# Patient Record
Sex: Male | Born: 1935 | Race: White | Hispanic: No | Marital: Married | Smoking: Former smoker
Health system: Southern US, Community
[De-identification: ages and names within clinical notes are randomized; demographics above are authoritative.]

## PROBLEM LIST (undated history)

## (undated) DIAGNOSIS — C61 Malignant neoplasm of prostate: Secondary | ICD-10-CM

## (undated) DIAGNOSIS — K219 Gastro-esophageal reflux disease without esophagitis: Secondary | ICD-10-CM

## (undated) DIAGNOSIS — I10 Essential (primary) hypertension: Secondary | ICD-10-CM

## (undated) DIAGNOSIS — M2140 Flat foot [pes planus] (acquired), unspecified foot: Secondary | ICD-10-CM

## (undated) DIAGNOSIS — I340 Nonrheumatic mitral (valve) insufficiency: Secondary | ICD-10-CM

## (undated) DIAGNOSIS — I442 Atrioventricular block, complete: Secondary | ICD-10-CM

## (undated) DIAGNOSIS — Z8719 Personal history of other diseases of the digestive system: Secondary | ICD-10-CM

## (undated) DIAGNOSIS — I2510 Atherosclerotic heart disease of native coronary artery without angina pectoris: Secondary | ICD-10-CM

## (undated) DIAGNOSIS — M1990 Unspecified osteoarthritis, unspecified site: Secondary | ICD-10-CM

## (undated) DIAGNOSIS — N529 Male erectile dysfunction, unspecified: Secondary | ICD-10-CM

## (undated) DIAGNOSIS — M7740 Metatarsalgia, unspecified foot: Secondary | ICD-10-CM

## (undated) DIAGNOSIS — E7800 Pure hypercholesterolemia, unspecified: Secondary | ICD-10-CM

## (undated) DIAGNOSIS — C329 Malignant neoplasm of larynx, unspecified: Secondary | ICD-10-CM

## (undated) DIAGNOSIS — C44310 Basal cell carcinoma of skin of unspecified parts of face: Secondary | ICD-10-CM

## (undated) DIAGNOSIS — I219 Acute myocardial infarction, unspecified: Secondary | ICD-10-CM

## (undated) DIAGNOSIS — G2581 Restless legs syndrome: Secondary | ICD-10-CM

## (undated) DIAGNOSIS — Z950 Presence of cardiac pacemaker: Secondary | ICD-10-CM

## (undated) DIAGNOSIS — Z8679 Personal history of other diseases of the circulatory system: Secondary | ICD-10-CM

## (undated) HISTORY — DX: Pure hypercholesterolemia, unspecified: E78.00

## (undated) HISTORY — DX: Atherosclerotic heart disease of native coronary artery without angina pectoris: I25.10

## (undated) HISTORY — PX: COLONOSCOPY W/ POLYPECTOMY: SHX1380

## (undated) HISTORY — DX: Metatarsalgia, unspecified foot: M77.40

## (undated) HISTORY — DX: Flat foot (pes planus) (acquired), unspecified foot: M21.40

## (undated) HISTORY — PX: GANGLION CYST EXCISION: SHX1691

## (undated) HISTORY — DX: Personal history of other diseases of the circulatory system: Z86.79

## (undated) HISTORY — DX: Restless legs syndrome: G25.81

## (undated) HISTORY — PX: BASAL CELL CARCINOMA EXCISION: SHX1214

## (undated) HISTORY — DX: Gastro-esophageal reflux disease without esophagitis: K21.9

## (undated) HISTORY — DX: Essential (primary) hypertension: I10

## (undated) HISTORY — PX: CORONARY ANGIOPLASTY: SHX604

## (undated) HISTORY — DX: Malignant neoplasm of prostate: C61

## (undated) HISTORY — DX: Male erectile dysfunction, unspecified: N52.9

## (undated) HISTORY — DX: Personal history of other diseases of the digestive system: Z87.19

## (undated) HISTORY — PX: CORONARY ANGIOPLASTY WITH STENT PLACEMENT: SHX49

## (undated) HISTORY — DX: Nonrheumatic mitral (valve) insufficiency: I34.0

## (undated) HISTORY — PX: OTHER SURGICAL HISTORY: SHX169

## (undated) HISTORY — PX: PROSTATE BIOPSY: SHX241

---
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
## 1969-04-26 DIAGNOSIS — C329 Malignant neoplasm of larynx, unspecified: Secondary | ICD-10-CM

## 1969-04-26 HISTORY — DX: Malignant neoplasm of larynx, unspecified: C32.9

## 1999-06-15 ENCOUNTER — Ambulatory Visit (HOSPITAL_COMMUNITY): Admission: RE | Admit: 1999-06-15 | Discharge: 1999-06-15 | Payer: Self-pay | Admitting: Gastroenterology

## 1999-07-24 ENCOUNTER — Emergency Department (HOSPITAL_COMMUNITY): Admission: EM | Admit: 1999-07-24 | Discharge: 1999-07-24 | Payer: Self-pay | Admitting: Emergency Medicine

## 1999-08-27 DIAGNOSIS — I219 Acute myocardial infarction, unspecified: Secondary | ICD-10-CM

## 1999-08-27 HISTORY — DX: Acute myocardial infarction, unspecified: I21.9

## 1999-08-29 ENCOUNTER — Ambulatory Visit (HOSPITAL_COMMUNITY): Admission: RE | Admit: 1999-08-29 | Discharge: 1999-08-29 | Payer: Self-pay | Admitting: Internal Medicine

## 2000-05-22 ENCOUNTER — Encounter: Payer: Self-pay | Admitting: Emergency Medicine

## 2000-05-22 ENCOUNTER — Inpatient Hospital Stay (HOSPITAL_COMMUNITY): Admission: EM | Admit: 2000-05-22 | Discharge: 2000-05-25 | Payer: Self-pay | Admitting: Emergency Medicine

## 2000-06-24 ENCOUNTER — Encounter (HOSPITAL_COMMUNITY): Admission: RE | Admit: 2000-06-24 | Discharge: 2000-09-22 | Payer: Self-pay | Admitting: Cardiology

## 2000-08-11 ENCOUNTER — Ambulatory Visit (HOSPITAL_BASED_OUTPATIENT_CLINIC_OR_DEPARTMENT_OTHER): Admission: RE | Admit: 2000-08-11 | Discharge: 2000-08-11 | Payer: Self-pay | Admitting: Gastroenterology

## 2000-09-23 ENCOUNTER — Encounter (HOSPITAL_COMMUNITY): Admission: RE | Admit: 2000-09-23 | Discharge: 2000-09-28 | Payer: Self-pay | Admitting: Cardiology

## 2000-09-29 ENCOUNTER — Encounter (HOSPITAL_COMMUNITY): Admission: RE | Admit: 2000-09-29 | Discharge: 2000-12-28 | Payer: Self-pay | Admitting: Cardiology

## 2000-11-09 ENCOUNTER — Emergency Department (HOSPITAL_COMMUNITY): Admission: EM | Admit: 2000-11-09 | Discharge: 2000-11-09 | Payer: Self-pay

## 2000-11-09 ENCOUNTER — Encounter: Payer: Self-pay | Admitting: Emergency Medicine

## 2000-12-11 ENCOUNTER — Ambulatory Visit (HOSPITAL_COMMUNITY): Admission: RE | Admit: 2000-12-11 | Discharge: 2000-12-12 | Payer: Self-pay | Admitting: Cardiology

## 2000-12-29 ENCOUNTER — Encounter (HOSPITAL_COMMUNITY): Admission: RE | Admit: 2000-12-29 | Discharge: 2001-03-03 | Payer: Self-pay | Admitting: Cardiology

## 2001-07-09 ENCOUNTER — Ambulatory Visit (HOSPITAL_COMMUNITY): Admission: RE | Admit: 2001-07-09 | Discharge: 2001-07-10 | Payer: Self-pay | Admitting: Cardiology

## 2002-01-28 ENCOUNTER — Ambulatory Visit (HOSPITAL_COMMUNITY): Admission: RE | Admit: 2002-01-28 | Discharge: 2002-01-29 | Payer: Self-pay | Admitting: Cardiology

## 2003-07-06 ENCOUNTER — Encounter: Admission: RE | Admit: 2003-07-06 | Discharge: 2003-07-06 | Payer: Self-pay | Admitting: Orthopedic Surgery

## 2003-07-25 ENCOUNTER — Encounter: Admission: RE | Admit: 2003-07-25 | Discharge: 2003-07-25 | Payer: Self-pay | Admitting: Orthopedic Surgery

## 2004-02-01 ENCOUNTER — Encounter: Admission: RE | Admit: 2004-02-01 | Discharge: 2004-02-01 | Payer: Self-pay | Admitting: Orthopedic Surgery

## 2004-03-07 ENCOUNTER — Encounter: Admission: RE | Admit: 2004-03-07 | Discharge: 2004-03-07 | Payer: Self-pay | Admitting: Orthopedic Surgery

## 2005-08-26 HISTORY — PX: FOOT ARTHRODESIS, TRIPLE: SUR54

## 2005-12-04 ENCOUNTER — Inpatient Hospital Stay (HOSPITAL_COMMUNITY): Admission: RE | Admit: 2005-12-04 | Discharge: 2005-12-06 | Payer: Self-pay | Admitting: Orthopedic Surgery

## 2006-04-01 ENCOUNTER — Ambulatory Visit: Admission: RE | Admit: 2006-04-01 | Discharge: 2006-05-18 | Payer: Self-pay | Admitting: Radiation Oncology

## 2006-04-16 ENCOUNTER — Ambulatory Visit (HOSPITAL_COMMUNITY): Admission: RE | Admit: 2006-04-16 | Discharge: 2006-04-16 | Payer: Self-pay | Admitting: Urology

## 2006-04-25 ENCOUNTER — Ambulatory Visit: Payer: Self-pay | Admitting: Internal Medicine

## 2006-06-06 ENCOUNTER — Ambulatory Visit: Payer: Self-pay | Admitting: Internal Medicine

## 2006-07-04 ENCOUNTER — Ambulatory Visit: Payer: Self-pay | Admitting: Internal Medicine

## 2006-08-26 DIAGNOSIS — C61 Malignant neoplasm of prostate: Secondary | ICD-10-CM | POA: Insufficient documentation

## 2006-08-26 HISTORY — DX: Malignant neoplasm of prostate: C61

## 2006-09-08 ENCOUNTER — Ambulatory Visit: Admission: RE | Admit: 2006-09-08 | Discharge: 2006-11-28 | Payer: Self-pay | Admitting: Radiation Oncology

## 2006-11-10 ENCOUNTER — Ambulatory Visit: Payer: Self-pay | Admitting: Internal Medicine

## 2006-11-27 ENCOUNTER — Ambulatory Visit (HOSPITAL_BASED_OUTPATIENT_CLINIC_OR_DEPARTMENT_OTHER): Admission: RE | Admit: 2006-11-27 | Discharge: 2006-11-27 | Payer: Self-pay | Admitting: Urology

## 2006-11-28 ENCOUNTER — Ambulatory Visit: Admission: RE | Admit: 2006-11-28 | Discharge: 2006-12-29 | Payer: Self-pay | Admitting: Radiation Oncology

## 2006-12-19 ENCOUNTER — Ambulatory Visit: Payer: Self-pay | Admitting: Internal Medicine

## 2007-02-03 ENCOUNTER — Ambulatory Visit: Payer: Self-pay | Admitting: Psychology

## 2007-02-10 ENCOUNTER — Ambulatory Visit: Payer: Self-pay | Admitting: Psychology

## 2007-02-24 ENCOUNTER — Ambulatory Visit: Payer: Self-pay | Admitting: Psychology

## 2007-02-24 ENCOUNTER — Ambulatory Visit: Payer: Self-pay | Admitting: Sports Medicine

## 2007-03-10 ENCOUNTER — Ambulatory Visit: Payer: Self-pay | Admitting: Psychology

## 2007-03-24 ENCOUNTER — Ambulatory Visit: Payer: Self-pay | Admitting: Psychology

## 2007-03-27 ENCOUNTER — Ambulatory Visit: Payer: Self-pay | Admitting: Sports Medicine

## 2007-04-06 ENCOUNTER — Ambulatory Visit: Payer: Self-pay | Admitting: Psychology

## 2007-04-17 ENCOUNTER — Ambulatory Visit: Payer: Self-pay | Admitting: Psychology

## 2007-04-21 ENCOUNTER — Ambulatory Visit (HOSPITAL_COMMUNITY): Admission: RE | Admit: 2007-04-21 | Discharge: 2007-04-21 | Payer: Self-pay | Admitting: Cardiology

## 2007-05-18 ENCOUNTER — Ambulatory Visit: Payer: Self-pay | Admitting: Internal Medicine

## 2007-05-21 ENCOUNTER — Ambulatory Visit: Payer: Self-pay | Admitting: Psychology

## 2007-06-02 ENCOUNTER — Ambulatory Visit: Payer: Self-pay | Admitting: Psychology

## 2007-06-16 ENCOUNTER — Ambulatory Visit: Payer: Self-pay | Admitting: Psychology

## 2007-06-30 ENCOUNTER — Ambulatory Visit: Payer: Self-pay | Admitting: Psychology

## 2007-07-14 ENCOUNTER — Ambulatory Visit: Payer: Self-pay | Admitting: Psychology

## 2007-07-29 ENCOUNTER — Ambulatory Visit: Payer: Self-pay | Admitting: Psychology

## 2007-08-11 ENCOUNTER — Ambulatory Visit: Payer: Self-pay | Admitting: Psychology

## 2007-09-09 ENCOUNTER — Ambulatory Visit: Payer: Self-pay | Admitting: Psychology

## 2007-09-15 ENCOUNTER — Encounter: Admission: RE | Admit: 2007-09-15 | Discharge: 2007-09-15 | Payer: Self-pay | Admitting: Internal Medicine

## 2007-09-24 ENCOUNTER — Ambulatory Visit: Payer: Self-pay | Admitting: Psychology

## 2007-10-07 ENCOUNTER — Ambulatory Visit: Payer: Self-pay | Admitting: Psychology

## 2007-10-28 ENCOUNTER — Ambulatory Visit: Payer: Self-pay | Admitting: Psychology

## 2007-11-13 DIAGNOSIS — K219 Gastro-esophageal reflux disease without esophagitis: Secondary | ICD-10-CM | POA: Insufficient documentation

## 2007-11-13 DIAGNOSIS — J309 Allergic rhinitis, unspecified: Secondary | ICD-10-CM | POA: Insufficient documentation

## 2007-11-16 ENCOUNTER — Ambulatory Visit: Payer: Self-pay | Admitting: Internal Medicine

## 2007-11-17 ENCOUNTER — Ambulatory Visit: Payer: Self-pay | Admitting: Psychology

## 2007-12-01 ENCOUNTER — Ambulatory Visit: Payer: Self-pay | Admitting: Psychology

## 2007-12-15 ENCOUNTER — Ambulatory Visit: Payer: Self-pay | Admitting: Psychology

## 2007-12-29 ENCOUNTER — Ambulatory Visit: Payer: Self-pay | Admitting: Psychology

## 2008-01-12 ENCOUNTER — Ambulatory Visit: Payer: Self-pay | Admitting: Psychology

## 2008-02-19 ENCOUNTER — Ambulatory Visit: Payer: Self-pay | Admitting: Sports Medicine

## 2008-04-05 ENCOUNTER — Ambulatory Visit: Payer: Self-pay | Admitting: Sports Medicine

## 2008-06-10 ENCOUNTER — Ambulatory Visit: Payer: Self-pay | Admitting: Internal Medicine

## 2008-07-22 ENCOUNTER — Ambulatory Visit: Payer: Self-pay | Admitting: Internal Medicine

## 2008-11-28 ENCOUNTER — Ambulatory Visit: Payer: Self-pay | Admitting: Internal Medicine

## 2008-12-12 ENCOUNTER — Ambulatory Visit: Payer: Self-pay | Admitting: Internal Medicine

## 2008-12-16 ENCOUNTER — Telehealth (INDEPENDENT_AMBULATORY_CARE_PROVIDER_SITE_OTHER): Payer: Self-pay | Admitting: *Deleted

## 2009-05-30 ENCOUNTER — Ambulatory Visit: Payer: Self-pay | Admitting: Internal Medicine

## 2009-11-27 ENCOUNTER — Ambulatory Visit: Payer: Self-pay | Admitting: Internal Medicine

## 2009-12-21 ENCOUNTER — Ambulatory Visit: Payer: Self-pay | Admitting: Sports Medicine

## 2010-03-26 ENCOUNTER — Ambulatory Visit: Payer: Self-pay | Admitting: Psychology

## 2010-04-06 ENCOUNTER — Ambulatory Visit: Payer: Self-pay | Admitting: Psychology

## 2010-04-20 ENCOUNTER — Ambulatory Visit: Payer: Self-pay | Admitting: Psychology

## 2010-05-22 ENCOUNTER — Ambulatory Visit: Payer: Self-pay | Admitting: Psychology

## 2010-07-27 ENCOUNTER — Telehealth (INDEPENDENT_AMBULATORY_CARE_PROVIDER_SITE_OTHER): Payer: Self-pay | Admitting: *Deleted

## 2010-07-30 ENCOUNTER — Ambulatory Visit: Payer: Self-pay | Admitting: Internal Medicine

## 2010-09-15 ENCOUNTER — Encounter: Payer: Self-pay | Admitting: Orthopedic Surgery

## 2010-09-27 NOTE — Assessment & Plan Note (Signed)
Summary: FOLLOW UP/ MBW   Primary Provider/Referring Provider:  Earl Gala   History of Present Illness: Current Problems:  FOOT PAIN, CHRONIC (ICD-729.5) HYPERTENSION (ICD-401.9) CARCINOMA, PROSTATE (ICD-185) ESOPHAGEAL REFLUX (ICD-530.81) COUGH (ICD-786.2) ALLERGIC RHINITIS (ICD-477.9) PES PLANUS (ICD-734) METATARSALGIA (ICD-726.70)  07/22/08- Saw TP 06/10/08-  77 year old male with known history of HTN, Allergic rhinitis.  June 10, 2008--Complains 3 weeks of cough minimally productive. [place], some better but has not totally resolved. Has reflux/heartburn  on regular basis. Denies chest pain, dyspnea, orthopnea, hemoptysis, fever, n/v/d, edema. Complains of nasal drip, cough worse in AM and at bedtime , feels tickle in throat, and frequent throat clearing. OTC not helping.   07/22/08- Cough, allergic rhinitis Was better initially after last visit then relapsed. Felt too dry during trip to [place]. Had persistent cough. No fever or acute change. Throat feels scratchy and hoarse. Denies sinus congestion, PND, fever. Chest feels ok, ears ok. Had flu shot. Dr. Earl Gala gave dextromethasone for colitis.   11/28/08- Cough, allergic rhinitis Had a persistent head cold in December and again in February. Bike rider. Notes less exercise tolerance going 20 miles on bike (age 32). Persistent throat clearing type cough, usually only with scant morning phlegm. currently pollen allergy is botherinig and he is taking chlorpheneramine with  Benadryl- makes hjim feel tired. aware of mitral valve leakeage. denies awareness of reflux.     Current Medications (verified): 1)  Atenolol 25 Mg  Tabs (Atenolol) .... Take 1/2  Tablet By Mouth Once A Day 2)  Exforge 10-320 Mg Tabs (Amlodipine Besylate-Valsartan) .... Take 1 By Mouth Once Daily 3)  Crestor 20 Mg  Tabs (Rosuvastatin Calcium) .... Take 1 Tablet By Mouth Once A Day 4)  Albuterol 90 Mcg/act  Aers (Albuterol) .... As  Needed Up To 4x Daily 5)  Multivitamins   Tabs (Multiple Vitamin) .... Take 1 Tablet By Mouth Once A Day 6)  Fish Oil   Oil (Fish Oil) .... Take 1 Capsule By Mouth Once A Day 7)  Coenzyme Q10 100 Mg  Caps (Coenzyme Q10) .... Take 1 Tablet By Mouth Two Times A Day 8)  Bayer Aspirin 325 Mg Tabs (Aspirin) .... Take 1 By Mouth Once Daily 9)  Chlorpheniramine Maleate 4 Mg Tabs (Chlorpheniramine Maleate) .... Take 3-4 Daily  Allergies (verified): No Known Drug Allergies  Past History:  Past Surgical History:    Foot arthrodesis    External beam xrt and seeds for prostate cancer  Family History:    Reviewed history and no changes required:       Father - died 44 yo cardiac disease       Mother- died old age  Social History:    Reviewed history from 11/16/2007 and no changes required:       Drinks 3-4 glasses of wine a day       Patient states former smoker.   Review of Systems      See HPI  The patient denies anorexia, fever, chest pain, syncope, and headaches.     Vital Signs:  Patient profile:   77 year old male Height:      69.5 inches Weight:      198.13 pounds BMI:     28.94 Pulse rate:   59 / minute BP sitting:   124 / 60  (left arm) Cuff size:   regular  Vitals Entered By: Reynaldo Minium CMA (November 28, 2008 2:14 PM)  O2 Sat on room air  at rest %:  98 Comments Medications reviewed with patient Reynaldo Minium CMA  November 28, 2008 2:16 PM    Physical Exam  Additional Exam:  General: A/Ox3; pleasant and cooperative, NAD, SKIN: no rash, lesions NODES: no lymphadenopathy HEENT: [place]/AT, EOM- WNL, Conjuctivae- clear, PERRLA, TM-WNL, Nose- clear, Throat- clear and wnl, throat clearing but appears  benign, Melampatti IV obscures pharynx, not red. Throat clearing. NECK: Supple w/ fair ROM, JVD- none, normal carotid impulses w/o bruits Thyroid- normal to palpation CHEST: Clear to P&A HEART: RRR, no m/g/r heard ABDOMEN:  ZOX:WRUE, nl pulses, no edema  NEURO: Grossly intact  to observation      Impression & Recommendations:  Problem # 1:  ALLERGIC RHINITIS (ICD-477.9)  Suggest he try a nonsedating antihistamine to avoid med induced "tiredness'. His updated medication list for this problem includes:    Chlorpheniramine Maleate 4 Mg Tabs (Chlorpheniramine maleate) .Marland Kitchen... Take 3-4 daily  Problem # 2:  COUGH (ICD-786.2)  Cyclical multifactorial. We discussed reassessing PFT due to his questions about copd from remote smoking.  Orders: Est. Patient Level III (45409) Pulmonary Referral (Pulmonary)  Medications Added to Medication List This Visit: 1)  Atenolol 25 Mg Tabs (Atenolol) .... Take 1/2  tablet by mouth once a day 2)  Exforge 10-320 Mg Tabs (Amlodipine besylate-valsartan) .... Take 1 by mouth once daily  Patient Instructions: 1)  Please schedule a follow-up appointment in 6 months. 2)  Schedule PFT 3)  Try using a nonsedating antihistamine like otc claritin- you can take this twice daily for awhile if needed.

## 2010-09-27 NOTE — Assessment & Plan Note (Signed)
Summary: new pt/1 yr post surgery on foot/having problems/el   Vital Signs:  Patient Profile:   77 Years Old Male Weight:      191 pounds Pulse rate:   54 / minute BP sitting:   121 / 55  Vitals Entered By: Lillia Pauls CMA (February 24, 2007 3:09 PM)               Chief Complaint:  NEW PT/R FOOT PAIN S/P FOOT SURGERY X 15 MOS AGO.  History of Present Illness: 77  year old male who had triple arthrodecisis of the right foot who c/o pain on walking or any weight placed on his right side of body.  Right foot fusion was because patient has been flat footed majority of his life and has been a runner for many years with wearing and tearing of right foot.  Recently recieved a corticosteroid injection that did not give any relief.  Dr Lajoyce Corners did his surgery. Was advised in [place] to get the same. Ran years ago for [facility] sense;  now primary activities are walking and tennis but unable to do these secondary to pain.    Past Medical History:    Heart Attack-2001   Social History:    Drinks 3-4 glasses of wine a day    non-smoker    Review of Systems       foot pain on the bottom of the foot when trying to ambulate or bear weight on the right side of his body    Foot/Ankle Exam  General:    Well-developed, well-nourished, in no acute distress; alert and oriented x 3.    Gait:    Walking with a slight limp on the right side  Skin:    Intact with no erythema; no scarring.    Inspection:    deformity: where the triple fusion is located on the right foot  He has total collapse of longitudinal arch lateral toes curled inward on the right foot note there is minimal motions of the right ankle  Left foot reveals pes planus hammertoe of second toe breakdown of the longitudinal and transverse arch  Palpation:    Tenderness under the metatarsal area of the right foot and near the talus bone  Vascular:    dorsalis pedis and posterior tibial pulses 2+ and symmetric, capillary refill  < 2 seconds, normal hair pattern, no evidence of ischemia.     Impression & Recommendations:  Problem # 1:  R Foot Pain Pain 2nd to triple arthodecisis as a result of new pressure points   Problem # 2:  METATARSALGIA (ICD-726.70) added MT pads to both soft orthotic inserts his Orders: Metatarsal pads- [facility] (W1191)   Problem # 3:  PES PLANUS (ICD-734) will need permanent orthotic support does   Patient Instructions: 1)  Temporary orthotics were made for the patient to get temporary relief.  If they continue to relieve pressure from his right foot and left foot, more permanent orthotics will be made if these continue to give him relief for a month. 2)  Follow-up in a month

## 2010-09-27 NOTE — Assessment & Plan Note (Signed)
Summary: F/U VISIT [facility]   Vital Signs:  Patient Profile:   77 Years Old Male Weight:      193 pounds Pulse rate:   56 / minute BP sitting:   119 / 61  Vitals Entered By: Enid Baas MD (March 27, 2007 10:14 AM)               Chief Complaint:  POSSIBLE ORTHOTICS S/P TRIPLE ARTHRODESIS.  History of Present Illness: Pt. is a 77 yo wm with f/u for bilateral foot pain right greater than left, s/p right triple arthrodesis 2007 by Dr. Lajoyce Corners. Patient has two pairs of orthotics, previously built up with large metatarsal pads at last SM visit. Pt. felt some pain after wearing for approx. 4 hours in arch, so altered own orthotics with small metatarsal pads and right hammer toe pad. feeling somewhat better but remains symptomatic.   minimally physically active.   some pain with toe raises and when stretching of achilles tendon on right.    Past Medical History:    Heart Attack-2001    Prostate cancer - 2008     Review of Systems  General      recent prostate cancer  MS      Complains of joint pain and stiffness.      Denies joint swelling.      as hpi   Physical Exam  General:     alert, well-developed, well-nourished, and well-hydrated.   Head:     normocephalic and atraumatic.   Eyes:     vision grossly intact.   Nose:     no external deformity.   Mouth:     good dentition.   Lungs:     normal respiratory effort.   Msk:     no joint tenderness, no joint swelling, no joint warmth, and no redness over joints.  calf atrophy bilaterally. pes planus bilaterally r greater than left. 1st and 3rd and 4th metatarsal heads prominent with elevated 2nd metatarsal heads. 5th palpalble and inward turning.  Has chronic foot deformity secondary to collapse of transverse and longitudinal arches.  Has breakdwon incorporating both the rear amd mid foot areas.  ON RT there is scarring from arthrodesis and minimal ankle motion. Pulses:     L dorsalis pedis normal.  r dp  1+ Extremities:     No clubbing, cyanosis, edema, or deformity noted with normal full range of motion of all joints.   Neurologic:     alert & oriented X3 and sensation intact to light touch.      Impression & Recommendations:  Problem # 1:  METATARSALGIA (ICD-726.70) Assessment: Improved continue with either set of metatarsal pads, posting applied to right lateral orthotics times 2. work on internal toe flexion and calf exercises with sitting without pain.  given hapad small metatarsal pads. Orders: [facility]- Est Level  3 (54098)   Problem # 2:  PES PLANUS (ICD-734) Assessment: Unchanged as above  Marked foot breadown bilaterally and we are left even after arthrodesis with strategies to try to lessen pain and improve function. Orders: [facility]- Est Level  3 ([id number])

## 2010-09-27 NOTE — Assessment & Plan Note (Signed)
Summary: foot pain/eo   Vital Signs:  Patient Profile:   77 Years Old Male Pulse rate:   65 / minute BP sitting:   119 / 68  Vitals Entered By: Andrew Marshall CMA (February 19, 2008 10:43 AM)                 PCP:  Earl Gala  Chief Complaint:  L FOOT PAIN WORSE IN LAST WEEK.  History of Present Illness: Andrew Marshall comes in for eval of L foot pain.  He has a history of R foot fusion secondary to marked arthritic collapse and foot pain.  He has had progressive collapse of the L longitidinal arch and associated pain both in the arch and in the base of the second toe.  He was recently in to see Dr. Farris Has who evaluated him with L foot xrays which were reportedly normal.  He brings these films with him today.    He has been alternating between 2 pairs of orthotics.  First, he has some somewhat firm blue orthotics with cork base.  He also has some OTC red cushion orthotics which he finds very comfortable.  want to see if we can help him lessen his foot pain more pain moving to forefoot    Current Allergies: No known allergies       Physical Exam  General:     pleasant, elderly WM in NAD Head:     Normocephalic and atraumatic without obvious abnormalities. No apparent alopecia or balding. Msk:     L FOOT:  Marked collapse of longitudinal arch with significant navicular drop TTP along post tib tendon, esp near insertion TTP plantar surface of 2nd MTP joint and 2nd proximal phalanx breakdown and crossing toes in forefoot  Rt foot with ankle fusion narrow width  collapse of forefoot and transverse arch    Impression & Recommendations:  Problem # 1:  METATARSALGIA (ICD-726.70) re pad these today and try different adjustments to take pressure off forefoot Orders: [facility]- Est Level  3 (04540) Metatarsal pads- [facility] (J8119)   Problem # 2:  PES PLANUS (ICD-734) cont the OTC orthotics until we can create a new permanent insert Orders: [facility]- Est Level  3 ([id number])   Problem # 3:   FOOT PAIN, CHRONIC (ICD-729.5) we need to try to order and specially make him permanent orthotics unalbe to resolve his pain without more intervention RT ankle fusion has left him very limited and more pain on left side which works harder Orders: [facility]- Est Level  3 ([id number])   Complete Medication List: 1)  Atenolol 25 Mg Tabs (Atenolol) .... Take 1 tablet by mouth once a day 2)  Exforge 5-320 Mg Tabs (Amlodipine besylate-valsartan) .... Take 1 tablet by mouth once a day 3)  Zetia 10 Mg Tabs (Ezetimibe) .... Take 1 tablet by mouth once a day 4)  Crestor 20 Mg Tabs (Rosuvastatin calcium) .... Take 1 tablet by mouth once a day 5)  Albuterol 90 Mcg/act Aers (Albuterol) .... As needed up to 4x daily 6)  Nasonex 50 Mcg/act Susp (Mometasone furoate) .Marland Kitchen.. 1-2 sprays in each nostril once daily 7)  Multivitamins Tabs (Multiple vitamin) .... Take 1 tablet by mouth once a day 8)  Fish Oil Oil (Fish oil) .... Take 1 capsule by mouth once a day 9)  Coenzyme Q10 100 Mg Caps (Coenzyme q10) .... Take 1 tablet by mouth two times a day 10)  Magnesium Oxide 400 Mg Tabs (Magnesium oxide) .... Take 1 tablet by mouth  two times a day    ]

## 2010-09-27 NOTE — Assessment & Plan Note (Signed)
Summary: constant cough/apc   PCP:  Earl Gala  Chief Complaint:  cough occ producing off-wite sputum since pt returned from [place] 3 weeks ago.  History of Present Illness:  77 year old male with known history of HTN, Allergic rhinitis.   June 10, 2008--Complains 3 weeks of cough minimally productive. [place], some better but has not totally resolved. Has reflux/heartburn  on regular basis. Denies chest pain, dyspnea, orthopnea, hemoptysis, fever, n/v/d, edema. Complains of nasal drip, cough worse in AM and at bedtime , feels tickle in throat, and frequent throat clearing. OTC not helping.         Prior Medications Reviewed Using: Patient Recall  Updated Prior Medication List: ATENOLOL 25 MG  TABS (ATENOLOL) Take 1 tablet by mouth once a day EXFORGE 5-320 MG  TABS (AMLODIPINE BESYLATE-VALSARTAN) Take 1 tablet by mouth once a day CRESTOR 20 MG  TABS (ROSUVASTATIN CALCIUM) Take 1 tablet by mouth once a day ALBUTEROL 90 MCG/ACT  AERS (ALBUTEROL) as needed up to 4x daily MULTIVITAMINS   TABS (MULTIPLE VITAMIN) Take 1 tablet by mouth once a day FISH OIL   OIL (FISH OIL) Take 1 capsule by mouth once a day COENZYME Q10 100 MG  CAPS (COENZYME Q10) Take 1 tablet by mouth two times a day  Current Allergies (reviewed today): No known allergies   Past Medical History:    Reviewed history from 03/27/2007 and no changes required:       Heart Attack-2001       Prostate cancer - 2008       FOOT PAIN, CHRONIC (ICD-729.5)       HYPERTENSION (ICD-401.9)       CARCINOMA, PROSTATE (ICD-185)       ESOPHAGEAL REFLUX (ICD-530.81)       COUGH (ICD-786.2)       ALLERGIC RHINITIS (ICD-477.9)       PES PLANUS (ICD-734)       METATARSALGIA (ICD-726.70)           Family History:    Reviewed history and no changes required:  Social History:    Reviewed history from 11/16/2007 and no changes required:       Drinks 3-4 glasses of wine a day       Patient states former  smoker.    Risk Factors: Tobacco use:  quit    Year quit:  2001    Pack-years:  40 years  1/2 pack daily   Review of Systems      See HPI   Vital Signs:  Patient Profile:   77 Years Old Male Weight:      198 pounds O2 Sat:      98 % O2 treatment:    Room Air Temp:     97.7 degrees F oral Pulse rate:   68 / minute BP sitting:   124 / 60  (left arm) Cuff size:   regular  Vitals Entered By: Boone Master CNA (June 10, 2008 11:12 AM)             Is Patient Diabetic? No Comments Medications reviewed with patient Boone Master CNA  June 10, 2008 11:12 AM      Physical Exam  GEN: A/Ox3; pleasant , NAD HEENT:  [place]/AT, , EACs-clear, TMs-wnl, NOSE-clear, THROAT-clear NECK:  Supple w/ fair ROM; no JVD; normal carotid impulses w/o bruits; no thyromegaly or nodules palpated; no lymphadenopathy. CHEST:  Coarse BS with mild upper airway psuedowheezing on forced expiration HEART:  RRR, no m/r/g   ABDOMEN:  Soft & nt; nml bowel sounds; no organomegaly or masses detected. EXT: Warm bil,  no calf pain, edema, clubbing, pulses intact Neuro: EOM-wnl, PERRLA, CN 2-12 intact,nml equal grips/streng       Impression & Recommendations:  Problem # 1:  COUGH (ICD-786.2) suspect post bronchitic cough fueled by possible rhinitis and or GERD REC: Prednsione taper over next week.  Chlortrimeton as needed nasal drip/drainage/throat clearing Sugarless candy to avoid throat clearing, or couging.  Delsym 2 tsp two times a day as needed cough Tessalon 200mg  three times a day as needed still coughing Phenergan VC codeine 1-2 tsp every 4 -6 hr as needed severe cough, may make you sleepy.  Zantac 150mg  at bedtime  (intolerant to PPI) Saline nasal as needed  Hold fish oil Please contact office for sooner follow up if symptoms do not improve or worsen  follow up 2-3 weeks Dr. Maple Hudson   Orders: Est. Patient Level IV (16109)   Medications Added to Medication List This Visit: 1)   Tessalon 200 Mg Caps (Benzonatate) .Marland Kitchen.. 1 by mouth three times a day as needed cough 2)  Prednisone 10 Mg Tabs (Prednisone) .... 4 tabs for 2 days, then 3 tabs for 2 days, 2 tabs for 2 days, then 1 tab for 2 days, then stop 3)  Promethazine-codeine 6.25-10 Mg/102ml Syrp (Promethazine-codeine) .Marland Kitchen.. 1-2 tsp every 4-6 hr as needed cough  Complete Medication List: 1)  Atenolol 25 Mg Tabs (Atenolol) .... Take 1 tablet by mouth once a day 2)  Exforge 5-320 Mg Tabs (Amlodipine besylate-valsartan) .... Take 1 tablet by mouth once a day 3)  Crestor 20 Mg Tabs (Rosuvastatin calcium) .... Take 1 tablet by mouth once a day 4)  Albuterol 90 Mcg/act Aers (Albuterol) .... As needed up to 4x daily 5)  Multivitamins Tabs (Multiple vitamin) .... Take 1 tablet by mouth once a day 6)  Fish Oil Oil (Fish oil) .... Take 1 capsule by mouth once a day 7)  Coenzyme Q10 100 Mg Caps (Coenzyme q10) .... Take 1 tablet by mouth two times a day 8)  Tessalon 200 Mg Caps (Benzonatate) .Marland Kitchen.. 1 by mouth three times a day as needed cough 9)  Prednisone 10 Mg Tabs (Prednisone) .... 4 tabs for 2 days, then 3 tabs for 2 days, 2 tabs for 2 days, then 1 tab for 2 days, then stop 10)  Promethazine-codeine 6.25-10 Mg/18ml Syrp (Promethazine-codeine) .Marland Kitchen.. 1-2 tsp every 4-6 hr as needed cough   Patient Instructions: 1)  Prednsione taper over next week.  2)  Chlortrimeton as needed nasal drip/drainage/throat clearing 3)  Sugarless candy to avoid throat clearing, or couging.  4)  Delsym 2 tsp two times a day as needed cough 5)  Tessalon 200mg  three times a day as needed still coughing 6)  Phenergan VC codeine 1-2 tsp every 4 -6 hr as needed severe cough, may make you sleepy.  7)  Zantac 150mg  at bedtime  8)  Saline nasal as needed  9)  Hold fish oil 10)  Please contact office for sooner follow up if symptoms do not improve or worsen  11)  follow up 2-3 weeks Dr. Maple Hudson    Prescriptions: PROMETHAZINE-CODEINE 6.25-10 MG/5ML SYRP  (PROMETHAZINE-CODEINE) 1-2 tsp every 4-6 hr as needed cough  #8 oz x 0   Entered and Authorized by:   Rubye Oaks NP   Signed by:   Dawnn Nam NP on 06/10/2008   Method used:   Print  then Give to Patient   RxID:   [id number] PREDNISONE 10 MG TABS (PREDNISONE) 4 tabs for 2 days, then 3 tabs for 2 days, 2 tabs for 2 days, then 1 tab for 2 days, then stop  #20 x 0   Entered and Authorized by:   Rubye Oaks NP   Signed by:   Yessica Putnam NP on 06/10/2008   Method used:   Electronically to        [facility]  [place] [address] (retail)       [address]       [place], [place]  [address]       Ph: [phone] or [phone]       Fax: [phone]   RxID:   [id number] TESSALON 200 MG CAPS (BENZONATATE) 1 by mouth three times a day as needed cough  #45 x 1   Entered and Authorized by:   Rubye Oaks NP   Signed by:   Nabila Albarracin NP on 06/10/2008   Method used:   [facility]ically [facility]        [facility]  [place] [address] (retail)       [address]       [place], [place]  [address]       Ph: [phone] or [phone]       Fax: [phone]   RxID:   [id number]  ]

## 2010-09-27 NOTE — Assessment & Plan Note (Signed)
Summary: ROV 6 MONTHS///KWP   Visit Type:  Follow-up PCP:  Earl Gala  Chief Complaint:  follow up.  History of Present Illness: Current Problems:  HYPERTENSION (ICD-401.9) CARCINOMA, PROSTATE (ICD-185) ESOPHAGEAL REFLUX (ICD-530.81) COUGH (ICD-786.2) ALLERGIC RHINITIS (ICD-477.9) PES PLANUS (ICD-734) METATARSALGIA (ICD-726.7   Says water damage in home led to a dusty repair to replace some flooring bu no persistent mold. He restarted Nasonex for the Spring, noticing nasal congestion at night. Has adopted two cats. Prefers chlortimeton over newer antihistamines and denies it makes him sleepy. Coughs less than in the past. Not much chest congestion. Admits some exertional dyspnea on his bike now that he has started again after his prostate treatment. He pretreats with albuterol before exercise. he's concerned again about chronic insomnia. We discussed sleep hygiene and non-medication approaches including cognitive behavioral therapy.       Current Allergies (reviewed today): No known allergies   Past Medical History:    Reviewed history from 03/27/2007 and no changes required:       Heart Attack-2001       Prostate cancer - 2008   Social History:    Reviewed history from 02/24/2007 and no changes required:       Drinks 3-4 glasses of wine a day       Patient states former smoker.    Risk Factors:  Tobacco use:  quit    Year quit:  2001    Pack-years:  40 years  1/2 pack daily   Review of Systems      See HPI   Vital Signs:  Patient Profile:   77 Years Old Male Weight:      202.13 pounds O2 Sat:      98 % O2 treatment:    Room Air Pulse rate:   75 / minute BP sitting:   138 / 80  (left arm) Cuff size:   regular  Vitals Entered By: Darra Lis RMA (November 16, 2007 10:37 AM)             Is Patient Diabetic? No Comments Medications reviewed with patient ..................................................................Marland KitchenDarra Lis RMA  November 16, 2007  10:39 AM      Physical Exam  General:     normal appearance and healthy appearing.   Nose:     minor sniffing Neck:     no JVD.   Lungs:     decreased BS bilateral.   clear Heart:     regular rate and rhythm, S1, S2 without murmurs, rubs, gallops, or clicks Cervical Nodes:     no significant adenopathy Axillary Nodes:     no significant adenopathy     Impression & Recommendations:  Problem # 1:  ALLERGIC RHINITIS (ICD-477.9) mild seasonal exacerbation. Treatment discussed. His updated medication list for this problem includes:    Nasonex 50 Mcg/act Susp (Mometasone furoate) .Marland Kitchen... Watch out for cats.1-2 sprays in each nostril once daily  Orders: Est. Patient Level III (16109)   Problem # 2:  COUGH (ICD-786.2) Assessment: Improved Mild asthma Orders: Est. Patient Level III (60454)    Patient Instructions: 1)  Please schedule a follow-up appointment in 1 year. 2)  Call for meds as needed 3)  For the insomnia issue, consider looking for [email or web address]    ]

## 2010-09-27 NOTE — Assessment & Plan Note (Signed)
Summary: ORTHOTICS,[facility]   Vital Signs:  Patient profile:   77 year old male BP sitting:   132 / 68  Vitals Entered By: Lillia Pauls CMA (December 21, 2009 9:34 AM)  Primary Provider:  Earl Gala   History of Present Illness: Mr. Balfour comes in for custom orthotics.    Patient was last given custom orthotics in 2009 but feels these are not providing enough support at this time. Is s/p R foot fusion secondary to marked arthritic collapse and foot pain.   Also with collapse of L longitudinal arch Has pain mostly in bilateral feet at metatarsal heads on plantar aspect of foot. Has done well in past with orthotics with metatarsal pads and left sided heel wedge. Describes pain as a 'balled up feeling' under his metatarsal heads. No new complaints. Orthotics a little worn on superior aspects laterally.  Metatarsal pads also worn down.   Allergies (verified): No Known Drug Allergies  Physical Exam  General:  Well-developed,well-nourished,in no acute distress; alert,appropriate and cooperative throughout examination Msk:  L FOOT:  Marked collapse of longitudinal arch with significant navicular drop 2nd-5th toes point medially and do not make contact with ground on ambulation.  2nd toe overrides great toe. TTP metatarsal heads plantar surface 2nd-4th. hallux rigidus  R foot with ankle fusion Only about 10 degrees of motion at ankle. narrow width collapse of forefoot and transverse arch Less TTP metatarsals on this side on plantar surface. hallux rigidus   Impression & Recommendations:  Problem # 1:  FOOT PAIN, CHRONIC (ICD-729.5) Assessment Deteriorated  Replaced MT pads on his old orthotics and made new set of orthotics as these ones are getting worn down.  He will follow up with [place] as needed.  Patient was fitted for a : standard, cushioned, semi-rigid orthotic. The orthotic was heated and afterward the patient stood on the orthotic blank positioned on the orthotic stand. The  patient was positioned in subtalar neutral position and 10 degrees of ankle dorsiflexion in a weight bearing stance. After completion of molding, a stable base was applied to the orthotic blank. The blank was ground to a stable position for weight bearing. Size: 16 black striped fasttek Base: blue med density EVA Posting: left medial heel wedge and 1st ray post, Large MT pads bilaterally with small MT pad in front of this on left side only. Total prep time: 50 minutes  Felt very comfortable and better support.  Gait improved with less pronation with these in, less pain at MT heads.  Orders: [employer], each unit [phone])  Problem # 2:  METATARSALGIA (ICD-726.70) Assessment: Deteriorated  See #1 above.  Orders: [employer], each unit [phone])  Problem # 3:  PES PLANUS (ICD-734)  Complete Medication List: 1)  Atenolol 25 Mg Tabs (Atenolol) .... Take 1/2  tablet by mouth once a day 2)  Exforge 10-320 Mg Tabs (Amlodipine besylate-valsartan) .... Take 1 by mouth once daily 3)  Crestor 20 Mg Tabs (Rosuvastatin calcium) .... Take 1 tablet by mouth once a day 4)  Multivitamins Tabs (Multiple vitamin) .... Take 1 tablet by mouth once a day 5)  Fish Oil Oil (Fish oil) .... Take 1 capsule by mouth once a day 6)  Coenzyme Q10 100 Mg Caps (Coenzyme q10) .... Take 1 tablet by mouth two times a day 7)  Bayer Aspirin 325 Mg Tabs (Aspirin) .... Take 1 by mouth once daily 8)  Chlorpheniramine Maleate 4 Mg Tabs (Chlorpheniramine maleate) .... Take 3-4 daily

## 2010-09-27 NOTE — Assessment & Plan Note (Signed)
Summary: cough//come in at 11:15//ok per KW//lmr   Primary Provider/Referring Provider:  Earl Gala  CC:  Acute visit-cough(clear to green in color) and runny nose at times; pressure and nasal congestion.Marland Kitchen  History of Present Illness: 2008-12-19- Cough, allergic rhinitis Had a persistent head cold in December and again in February. Bike rider. Notes less exercise tolerance going 20 miles on bike (age 77). Persistent throat clearing type cough, usually only with scant morning phlegm. currently pollen allergy is botherinig and he is taking chlorpheneramine with  Benadryl- makes hjim feel tired. aware of mitral valve leakeage. denies awareness of reflux.  May 30, 2009- Cough, allergic rhinitis Caught a cold visiting [place] 7-10 days ago. Lingering throat congestion, not very productive. Takes chlortimeton. Denying fever  November 27, 2009- Cough, allergic rhinitis This winter had much less cough after he put in a whole house filter and a steam humidifier. Not a lot of seasonal pollen allergy yet- he expects it as the grass pollen season comes in a few weeks. Albuterol rescue inhaler not effective. He is trying to ride bikes- runs out of air on hills. We had a discussion of regular exercise for stamina.  July 30, 2010-  Cough, allergic rhinitis cc- Acute visit-cough (clear to green in color), runny nose at times; pressure and nasal congestion. Flu shot 3 weeks ago. Got a cold a few days later with rhinorhea and persistent cough. Took claritin and clortimeton. Less cough now and not very green. Feels more dypneic and occasionally wheezey. Frontal pressure.     Preventive Screening-Counseling & Management  Alcohol-Tobacco     Smoking Status: quit     Packs/Day: 2001     Year Quit: 2001     Pack years: 40 years  1/2 pack daily  Current Medications (verified): 1)  Atenolol 25 Mg  Tabs (Atenolol) .... Take 1/2  Tablet By Mouth Once A Day 2)  Exforge 5-160 Mg Tabs (Amlodipine  Besylate-Valsartan) .... Take 1 By Mouth Once Daily 3)  Crestor 20 Mg  Tabs (Rosuvastatin Calcium) .... Take 1 Tablet By Mouth Once A Day 4)  Multivitamins   Tabs (Multiple Vitamin) .... Take 1 Tablet By Mouth Once A Day 5)  Fish Oil   Oil (Fish Oil) .... Take 1 Capsule By Mouth Two Times A Day 6)  Coenzyme Q10 100 Mg  Caps (Coenzyme Q10) .... Take 1 Tablet By Mouth Two Times A Day 7)  Bayer Aspirin 325 Mg Tabs (Aspirin) .... Take 1 By Mouth Once Daily 8)  Chlorpheniramine Maleate 4 Mg Tabs (Chlorpheniramine Maleate) .... Take 3-4 Daily 9)  Claritin 10 Mg Tabs (Loratadine) .... Take 1 By Mouth Once Daily  Allergies (verified): No Known Drug Allergies  Past History:  Past Medical History: Last updated: 06/10/2008 Heart Attack-2001 Prostate cancer - 2008 FOOT PAIN, CHRONIC (ICD-729.5) HYPERTENSION (ICD-401.9) CARCINOMA, PROSTATE (ICD-185) ESOPHAGEAL REFLUX (ICD-530.81) COUGH (ICD-786.2) ALLERGIC RHINITIS (ICD-477.9) PES PLANUS (ICD-734) METATARSALGIA (ICD-726.70)    Past Surgical History: Last updated: 12-19-2008 Foot arthrodesis External beam xrt and seeds for prostate cancer  Family History: Last updated: 2008-12-19 Father - died 68 yo cardiac disease Mother- died old age  Social History: Last updated: 11/16/2007 Drinks 3-4 glasses of wine a day Patient states former smoker.   Risk Factors: Smoking Status: quit (07/30/2010) Packs/Day: 2001 (07/30/2010)  Review of Systems      See HPI       The patient complains of productive cough, nasal congestion/difficulty breathing through nose, and change in color of  mucus.  The patient denies shortness of breath with activity, shortness of breath at rest, non-productive cough, coughing up blood, chest pain, irregular heartbeats, acid heartburn, indigestion, loss of appetite, weight change, abdominal pain, difficulty swallowing, sore throat, tooth/dental problems, headaches, sneezing, itching, ear ache, hand/feet swelling, and  fever.    Vital Signs:  Patient profile:   77 year old male Height:      69.5 inches Weight:      193.50 pounds BMI:     28.27 O2 Sat:      100 % on Room air Pulse rate:   68 / minute BP sitting:   150 / 76  (left arm) Cuff size:   regular  Vitals Entered By: Reynaldo Minium CMA (July 30, 2010 11:24 AM)  O2 Flow:  Room air CC: Acute visit-cough(clear to green in color), runny nose at times; pressure and nasal congestion.   Physical Exam  Additional Exam:  General: A/Ox3; pleasant and cooperative, NAD, SKIN: no rash, lesions NODES: no lymphadenopathy HEENT: [place]/AT, EOM- WNL, Conjuctivae- clear, PERRLA, TM-WNL, Nose- clear, Throat- clear and wnl, Mallampati IV obscures pharynx, not red. Throat clearing. NECK: Supple w/ fair ROM, JVD- none, normal carotid impulses w/o bruits Thyroid-  CHEST: Clear to P&A, no cough or wheeze noted today.  HEART: RRR, no m/g/r heard ABDOMEN:  ZOX:WRUE, nl pulses, no edema  NEURO: Grossly intact to observation      Impression & Recommendations:  Problem # 1:  COUGH (ICD-786.2) URI resolving. He doesn't feel need for cough syrup. We agreed to let him hold a script for antibiotic to fill if needed later in the week.  He avoids decongestants due to hx HBP.  Discussed hydration and Mucinex.   Medications Added to Medication List This Visit: 1)  Exforge 5-160 Mg Tabs (Amlodipine besylate-valsartan) .... Take 1 by mouth once daily 2)  Fish Oil Oil (Fish oil) .... Take 1 capsule by mouth two times a day 3)  Claritin 10 Mg Tabs (Loratadine) .... Take 1 by mouth once daily 4)  Zithromax Z-pak 250 Mg Tabs (Azithromycin) .... 2 today then one daily antibiotic  Other Orders: Est. Patient Level III (45409)   Patient Instructions: 1)  Please schedule a follow-up appointment in 1 year. Call sooner as needed.  2)  Script to hold for Z pak in case infection seems to get worse. 3)  Fluids and mucinex may help clear the mucus, but this bug has  lingered for people.  Prescriptions: ZITHROMAX Z-PAK 250 MG TABS (AZITHROMYCIN) 2 today then one daily antibiotic  #1 pak x 0   Entered and Authorized by:   Waymon Budge MD   Signed by:   Waymon Budge MD on 07/30/2010   Method used:   Print then Give to Patient   RxID:   [id number]

## 2010-09-27 NOTE — Miscellaneous (Signed)
Summary: Orders Update pft charges  Clinical Lists Changes  Orders: Added new Service order of Carbon Monoxide diffusing w/capacity (94720) - Signed Added new Service order of Lung Volumes (94240) - Signed Added new Service order of Spirometry (Pre & Post) (94060) - Signed 

## 2010-09-27 NOTE — Progress Notes (Signed)
Summary: sick needs appt  Phone Note Call from Patient Call back at Home Phone [phone] Call back at [phone]   Caller: Patient Call For: young Summary of Call: pt is sick wants to be seen sooner than jan he is sick Initial call taken by: Lacinda Axon,  July 27, 2010 1:13 PM  Follow-up for Phone Call        spoke with pt and he is c/o runny nose, constant  cough that is mostly dry but sometime he coughs up clear phlegm, and head congestion. Pt states this has been ongoing fo ra while and he has tried several OTC antihistamines with no relief. Pt is requesting an appt with Dr. Maple Hudson sooner then 1st available. Please advise.Carron Curie CMA  July 27, 2010 3:29 PM   Additional Follow-up for Phone Call Additional follow up Details #1::        Ok to have pt come in Monday 1115am for 1130am appt.Reynaldo Minium CMA  July 27, 2010 4:01 PM     Additional Follow-up for Phone Call Additional follow up Details #2::    Spoke with pt and sched appt for 11:30 on 12/5- pt aware to arrive at 11:15 am. Follow-up by: Vernie Murders,  July 27, 2010 4:07 PM

## 2010-09-27 NOTE — Progress Notes (Signed)
Summary: results  Phone Note Call from Patient   Caller: Patient Call For: young Summary of Call: need pft results Initial call taken by: Rickard Patience,  December 16, 2008 2:28 PM  Follow-up for Phone Call        Please advise PFT results. thanks Follow-up by: Vernie Murders,  December 16, 2008 2:30 PM  Additional Follow-up for Phone Call Additional follow up Details #1::        OK Additional Follow-up by: Waymon Budge MD,  December 22, 2008 8:02 PM    Additional Follow-up for Phone Call Additional follow up Details #2::    called and spoke with pt's wife and informed her of pt's PFT results showed some mild obstruction in small airways but overall scores were normal.   wife verbalized understanding and would relay message to pt.  Aundra Millet Reynolds LPN  December 23, 2008 9:46 AM

## 2010-09-27 NOTE — Assessment & Plan Note (Signed)
Summary: rov 6 months///kp   Primary Provider/Referring Provider:  Earl Gala  CC:  6 month follow up visit-Increased allergies"not bad right now".  History of Present Illness:  07/22/08- Cough, allergic rhinitis Was better initially after last visit then relapsed. Felt too dry during trip to [place]. Had persistent cough. No fever or acute change. Throat feels scratchy and hoarse. Denies sinus congestion, PND, fever. Chest feels ok, ears ok. Had flu shot. Dr. Earl Gala gave dextromethasone for colitis.   Dec 15, 2008- Cough, allergic rhinitis Had a persistent head cold in December and again in February. Bike rider. Notes less exercise tolerance going 20 miles on bike (age 33). Persistent throat clearing type cough, usually only with scant morning phlegm. currently pollen allergy is botherinig and he is taking chlorpheneramine with  Benadryl- makes hjim feel tired. aware of mitral valve leakeage. denies awareness of reflux.  May 30, 2009- Cough, allergic rhinitis Caught a cold visiting [place] 7-10 days ago. Lingering throat congestion, not very productive. Takes chlortimeton. Denying fever  November 27, 2009- Cough, allergic rhinitis This winter had much less cough after he put in a whole house filter and a steam humidifier. Not a lot of seasonal pollen allergy yet- he expects it as the grass pollen season comes in a few weeks. Albuterol rescue inhaler not effective. He is trying to ride bikes- runs out of air on hills. We had a discussion of regular exercise for stamina.    Current Medications (verified): 1)  Atenolol 25 Mg  Tabs (Atenolol) .... Take 1/2  Tablet By Mouth Once A Day 2)  Exforge 10-320 Mg Tabs (Amlodipine Besylate-Valsartan) .... Take 1 By Mouth Once Daily 3)  Crestor 20 Mg  Tabs (Rosuvastatin Calcium) .... Take 1 Tablet By Mouth Once A Day 4)  Multivitamins   Tabs (Multiple Vitamin) .... Take 1 Tablet By Mouth Once A Day 5)  Fish Oil   Oil (Fish Oil) .... Take 1 Capsule By  Mouth Once A Day 6)  Coenzyme Q10 100 Mg  Caps (Coenzyme Q10) .... Take 1 Tablet By Mouth Two Times A Day 7)  Bayer Aspirin 325 Mg Tabs (Aspirin) .... Take 1 By Mouth Once Daily 8)  Chlorpheniramine Maleate 4 Mg Tabs (Chlorpheniramine Maleate) .... Take 3-4 Daily  Allergies (verified): No Known Drug Allergies  Past History:  Past Medical History: Last updated: 06/10/2008 Heart Attack-2001 Prostate cancer - 2008 FOOT PAIN, CHRONIC (ICD-729.5) HYPERTENSION (ICD-401.9) CARCINOMA, PROSTATE (ICD-185) ESOPHAGEAL REFLUX (ICD-530.81) COUGH (ICD-786.2) ALLERGIC RHINITIS (ICD-477.9) PES PLANUS (ICD-734) METATARSALGIA (ICD-726.70)    Past Surgical History: Last updated: 2008-12-15 Foot arthrodesis External beam xrt and seeds for prostate cancer  Family History: Last updated: December 15, 2008 Father - died 77 yo cardiac disease Mother- died old age  Social History: Last updated: 11/16/2007 Drinks 3-4 glasses of wine a day Patient states former smoker.   Risk Factors: Smoking Status: quit (11/16/2007) Packs/Day: 2001 (11/13/2007)  Review of Systems      See HPI  The patient denies anorexia, fever, weight loss, weight gain, vision loss, decreased hearing, hoarseness, chest pain, syncope, dyspnea on exertion, peripheral edema, prolonged cough, headaches, hemoptysis, and severe indigestion/heartburn.    Vital Signs:  Patient profile:   77 year old male Height:      69.5 inches Weight:      193.50 pounds BMI:     28.27 O2 Sat:      97 % on Room air Pulse rate:   55 / minute BP sitting:   124 / 72  (  left arm) Cuff size:   regular  Vitals Entered By: Reynaldo Minium CMA (November 27, 2009 1:56 PM)  O2 Flow:  Room air  Physical Exam  Additional Exam:  General: A/Ox3; pleasant and cooperative, NAD, SKIN: no rash, lesions NODES: no lymphadenopathy HEENT: [place]/AT, EOM- WNL, Conjuctivae- clear, PERRLA, TM-WNL, Nose- clear, Throat- clear and wnl, Mallampati IV obscures pharynx, not red.  Throat clearing. NECK: Supple w/ fair ROM, JVD- none, normal carotid impulses w/o bruits Thyroid-  CHEST: Clear to P&A HEART: RRR, no m/g/r heard ABDOMEN:  ZOX:WRUE, nl pulses, no edema  NEURO: Grossly intact to observation      Impression & Recommendations:  Problem # 1:  ALLERGIC RHINITIS (ICD-477.9)  Seasonal rhinitis. He tolerates chlortrimeton without drowsiness, used up to 2-3 x/day. It does seem to control cough. His updated medication list for this problem includes:    Chlorpheniramine Maleate 4 Mg Tabs (Chlorpheniramine maleate) .Marland Kitchen... Take 3-4 daily  Problem # 2:  COUGH (ICD-786.2) He has had some mild bronchitis and postnasal drip at times. The exertional dyspnea he describes now seems within his expected exercise capaciy, riding bike up hill, and doesn't suggest cardiac or other additional process to me as described at this visit.  Other Orders: Est. Patient Level III (45409)  Patient Instructions: 1)  Please schedule a follow-up appointment in 1 year. Earlier as needed. 2)  Consider trying alternative antihistamines:  3)  Allegra 180- fexofenadine 4)  Claritin- loratadine 5)  Zyrtec- cetirizine

## 2010-09-27 NOTE — Assessment & Plan Note (Signed)
Summary: 6 months/ mbw   Primary Provider/Referring Provider:  Earl Gala  CC:  6 month followup.  Pt c/o cough x 10 days- prod with clear sputum.  Also has occ runny nose.Marland Kitchen  History of Present Illness:  07/22/08- Cough, allergic rhinitis Was better initially after last visit then relapsed. Felt too dry during trip to [place]. Had persistent cough. No fever or acute change. Throat feels scratchy and hoarse. Denies sinus congestion, PND, fever. Chest feels ok, ears ok. Had flu shot. Dr. Earl Gala gave dextromethasone for colitis.   2008/11/30- Cough, allergic rhinitis Had a persistent head cold in December and again in February. Bike rider. Notes less exercise tolerance going 20 miles on bike (age 13). Persistent throat clearing type cough, usually only with scant morning phlegm. currently pollen allergy is botherinig and he is taking chlorpheneramine with  Benadryl- makes hjim feel tired. aware of mitral valve leakeage. denies awareness of reflux.  May 30, 2009- Cough, allergic rhinitis Caught a cold visiting [place] 7-10 days ago. Lingering throat congestion, not very productive. Takes chlortimeton. Denying fever   Current Medications (verified): 1)  Atenolol 25 Mg  Tabs (Atenolol) .... Take 1/2  Tablet By Mouth Once A Day 2)  Exforge 10-320 Mg Tabs (Amlodipine Besylate-Valsartan) .... Take 1 By Mouth Once Daily 3)  Crestor 20 Mg  Tabs (Rosuvastatin Calcium) .... Take 1 Tablet By Mouth Once A Day 4)  Albuterol 90 Mcg/act  Aers (Albuterol) .... As Needed Up To 4x Daily 5)  Multivitamins   Tabs (Multiple Vitamin) .... Take 1 Tablet By Mouth Once A Day 6)  Fish Oil   Oil (Fish Oil) .... Take 1 Capsule By Mouth Once A Day 7)  Coenzyme Q10 100 Mg  Caps (Coenzyme Q10) .... Take 1 Tablet By Mouth Two Times A Day 8)  Bayer Aspirin 325 Mg Tabs (Aspirin) .... Take 1 By Mouth Once Daily 9)  Chlorpheniramine Maleate 4 Mg Tabs (Chlorpheniramine Maleate) .... Take 3-4 Daily  Allergies (verified): No  Known Drug Allergies  Past History:  Past Medical History: Last updated: 06/10/2008 Heart Attack-2001 Prostate cancer - 2008 FOOT PAIN, CHRONIC (ICD-729.5) HYPERTENSION (ICD-401.9) CARCINOMA, PROSTATE (ICD-185) ESOPHAGEAL REFLUX (ICD-530.81) COUGH (ICD-786.2) ALLERGIC RHINITIS (ICD-477.9) PES PLANUS (ICD-734) METATARSALGIA (ICD-726.70)    Past Surgical History: Last updated: Nov 30, 2008 Foot arthrodesis External beam xrt and seeds for prostate cancer  Family History: Last updated: 2008/11/30 Father - died 59 yo cardiac disease Mother- died old age  Social History: Last updated: 11/16/2007 Drinks 3-4 glasses of wine a day Patient states former smoker.   Risk Factors: Smoking Status: quit (11/16/2007) Packs/Day: 2001 (11/13/2007)  Review of Systems      See HPI       The patient complains of productive cough and nasal congestion/difficulty breathing through nose.  The patient denies shortness of breath with activity, shortness of breath at rest, non-productive cough, coughing up blood, chest pain, irregular heartbeats, acid heartburn, indigestion, loss of appetite, weight change, abdominal pain, difficulty swallowing, sore throat, tooth/dental problems, headaches, and sneezing.    Vital Signs:  Patient profile:   77 year old male Weight:      197 pounds O2 Sat:      100 % on Room air Pulse rate:   60 / minute BP sitting:   126 / 72  (left arm)  Vitals Entered By: Vernie Murders (May 30, 2009 2:23 PM)  O2 Flow:  Room air  Physical Exam  Additional Exam:  General: A/Ox3; pleasant and cooperative,  NAD, SKIN: no rash, lesions NODES: no lymphadenopathy HEENT: [place]/AT, EOM- WNL, Conjuctivae- clear, PERRLA, TM-WNL, Nose- clear, Throat- clear and wnl,frequent throat clearing but appears  benign, Melampatti IV obscures pharynx, not red. Throat clearing. NECK: Supple w/ fair ROM, JVD- none, normal carotid impulses w/o bruits Thyroid-  CHEST: Clear to P&A HEART: RRR,  no m/g/r heard ABDOMEN:  FUX:NATF, nl pulses, no edema  NEURO: Grossly intact to observation      Impression & Recommendations:  Problem # 1:  COUGH (ICD-786.2)  Resolving URI but no target now for antibiotic and we don't see need to intervene. Discussed mucinex as an option and discussed indications for antibiotics later. He continues to ride his bike.  Problem # 2:  ALLERGIC RHINITIS (ICD-477.9)  His updated medication list for this problem includes:    Chlorpheniramine Maleate 4 Mg Tabs (Chlorpheniramine maleate) .Marland Kitchen... Take 3-4 daily  Other Orders: Est. Patient Level II (57322) Flu Vaccine 67yrs + (02542) Administration Flu vaccine - MCR (H0623) Flu Vaccine Consent Questions     Do you have a history of severe allergic reactions to this vaccine? no    Any prior history of allergic reactions to egg and/or gelatin? no    Do you have a sensitivity to the preservative Thimersol? no    Do you have a past history of Guillan-Barre Syndrome? no    Do you currently have an acute febrile illness? no    Have you ever had a severe reaction to latex? no    Vaccine information given and explained to patient? yes    Are you currently pregnant? no    Lot [id number]   Exp Date:11/23/2009   Site Given  right Deltoid IM Vernie Murders  May 30, 2009 2:59 PM  Patient Instructions: 1)  Please schedule a follow-up appointment in 6 months. 2)  Flu vax 3)  Consider mucinex, your Neti pot and extra fluids as needed to help you clear this. .Neysa Bonito

## 2010-09-27 NOTE — Assessment & Plan Note (Signed)
Summary: new orthotics/eo   Vital Signs:  Patient Profile:   77 Years Old Male Weight:      191 pounds Pulse rate:   65 / minute BP sitting:   126 / 50  Vitals Entered By: Lillia Pauls CMA (April 05, 2008 11:36 AM)                 PCP:  Earl Gala  Chief Complaint:  ORTHOTICS.  History of Present Illness: Andrew Marshall comes in for custom orthotics.  He has a history of R foot fusion secondary to marked arthritic collapse and foot pain.  He has had progressive collapse of the L longitidinal arch and associated pain both in the arch and in the base of the second toe.  He was evaluated previously and fitted with metatarsal pads as well lateral foot postings which have helped this.      Current Allergies: No known allergies       Physical Exam  General:     Well-developed,well-nourished,in no acute distress; alert,appropriate and cooperative throughout examination Msk:     L FOOT:  Marked collapse of longitudinal arch with significant navicular drop TTP along post tib tendon, esp near insertion TTP plantar surface of 2nd MTP joint and 2nd proximal phalanx (per patient, improved from last visit) breakdown and crossing toes in forefoot +hallux rigidus   Rt foot with ankle fusion narrow width  collapse of forefoot and transverse arch + hallux rigidis Pulses:     2+ dp pulses    Impression & Recommendations:  Problem # 1:  METATARSALGIA (ICD-726.70) Assessment: Improved Patient was fitted for a standard, cushioned, semi-rigid orthotic.  The orthotic was heated and the patient stood on the orthotic blank positioned on the orthotic stand. The patient was positioned in subtalar neutral position and 10 degrees of ankle dorsiflexion in a weight bearing stance. After completion of molding a stable based was applied to the orthotic blank.   The blank was ground to a stable position for weight bearing. size 14 black fasttech insert base blue mid density posting L medial  heel additional- L insert with small and large metatarsal pad. R foot with large metatarsal pad  Patient walks comfortably in new orthotics. Time spent with patient 45 minutes.  Orders: [facility]- Est  Level 4 (99214) [facility]- [facility] [phone])   Problem # 2:  FOOT PAIN, CHRONIC (ICD-729.5)  Orders: [facility]- Est  Level 4 (01027) [facility]- [facility] [phone])  I think he will get good benefit from custom orthotics particularly after his ankle fusion   Problem # 3:  PES PLANUS (ICD-734)  Orders: [facility]- Est  Level 4 (44034) [facility] [phone])  additional heel wedge added to left orthotic to lessen the pronation   Complete Medication List: 1)  Atenolol 25 Mg Tabs (Atenolol) .... Take 1 tablet by mouth once a day 2)  Exforge 5-320 Mg Tabs (Amlodipine besylate-valsartan) .... Take 1 tablet by mouth once a day 3)  Zetia 10 Mg Tabs (Ezetimibe) .... Take 1 tablet by mouth once a day 4)  Crestor 20 Mg Tabs (Rosuvastatin calcium) .... Take 1 tablet by mouth once a day 5)  Albuterol 90 Mcg/act Aers (Albuterol) .... As needed up to 4x daily 6)  Nasonex 50 Mcg/act Susp (Mometasone furoate) .Marland Kitchen.. 1-2 sprays in each nostril once daily 7)  Multivitamins Tabs (Multiple vitamin) .... Take 1 tablet by mouth once a day 8)  Fish Oil Oil (Fish oil) .... Take 1 capsule by mouth once  a day 9)  Coenzyme Q10 100 Mg Caps (Coenzyme q10) .... Take 1 tablet by mouth two times a day 10)  Magnesium Oxide 400 Mg Tabs (Magnesium oxide) .... Take 1 tablet by mouth two times a day    ]

## 2010-09-27 NOTE — Assessment & Plan Note (Signed)
Summary: rov/apc   PCP:  Earl Gala  Chief Complaint:  follow up visit.  History of Present Illness: Current Problems:  FOOT PAIN, CHRONIC (ICD-729.5) HYPERTENSION (ICD-401.9) CARCINOMA, PROSTATE (ICD-185) ESOPHAGEAL REFLUX (ICD-530.81) COUGH (ICD-786.2) ALLERGIC RHINITIS (ICD-477.9) PES PLANUS (ICD-734) METATARSALGIA (ICD-726.70)  Saw TP 06/10/08-  77 year old male with known history of HTN, Allergic rhinitis.  June 10, 2008--Complains 3 weeks of cough minimally productive. [place], some better but has not totally resolved. Has reflux/heartburn  on regular basis. Denies chest pain, dyspnea, orthopnea, hemoptysis, fever, n/v/d, edema. Complains of nasal drip, cough worse in AM and at bedtime , feels tickle in throat, and frequent throat clearing. OTC not helping.   07/22/08- Cough, allergic rhinitis Was better initially after last visit then relapsed. Felt too dry during trip to [place]. Had persistent cough. No fever or acute change. Throat feels scratchy and hoarse. Denies sinus congestion, PND, fever. Chest feels ok, ears ok. Had flu shot. Dr. Earl Gala gave dextromethasone for colitis.            Prior Medications Reviewed Using: List Brought by Patient  Updated Prior Medication List: ATENOLOL 25 MG  TABS (ATENOLOL) Take 1 tablet by mouth once a day EXFORGE 5-320 MG  TABS (AMLODIPINE BESYLATE-VALSARTAN) Take 1 tablet by mouth once a day CRESTOR 20 MG  TABS (ROSUVASTATIN CALCIUM) Take 1 tablet by mouth once a day ALBUTEROL 90 MCG/ACT  AERS (ALBUTEROL) as needed up to 4x daily MULTIVITAMINS   TABS (MULTIPLE VITAMIN) Take 1 tablet by mouth once a day FISH OIL   OIL (FISH OIL) Take 1 capsule by mouth once a day COENZYME Q10 100 MG  CAPS (COENZYME Q10) Take 1 tablet by mouth two times a day BAYER ASPIRIN 325 MG TABS (ASPIRIN) take 1 by mouth once daily CHLORPHENIRAMINE MALEATE 4 MG TABS (CHLORPHENIRAMINE MALEATE) take 3-4 daily DEXAMETHASONE 1  MG TABS (DEXAMETHASONE) take 3 daily for 6 weeks  Current Allergies (reviewed today): No known allergies   Past Medical History:    Reviewed history from 06/10/2008 and no changes required:       Heart Attack-2001       Prostate cancer - 2008       FOOT PAIN, CHRONIC (ICD-729.5)       HYPERTENSION (ICD-401.9)       CARCINOMA, PROSTATE (ICD-185)       ESOPHAGEAL REFLUX (ICD-530.81)       COUGH (ICD-786.2)       ALLERGIC RHINITIS (ICD-477.9)       PES PLANUS (ICD-734)       METATARSALGIA (ICD-726.70)           Social History:    Reviewed history from 11/16/2007 and no changes required:       Drinks 3-4 glasses of wine a day       Patient states former smoker.     Review of Systems      See HPI   Vital Signs:  Patient Profile:   77 Years Old Male Weight:      198.13 pounds O2 Sat:      99 % O2 treatment:    Room Air Pulse rate:   58 / minute BP sitting:   140 / 82  (left arm) Cuff size:   regular  Vitals Entered By: Reynaldo Minium CMA (July 22, 2008 9:34 AM)             Comments Medications reviewed with patient Reynaldo Minium CMA  July 22, 2008 9:34 AM      Physical Exam  General: A/Ox3; pleasant and cooperative, NAD, SKIN: no rash, lesions NODES: no lymphadenopathy HEENT: [place]/AT, EOM- WNL, Conjuctivae- clear, PERRLA, TM-WNL, Nose- clear, Throat- clear and wnl, hoarse, Melampatti IV obscures pharynx, not red. Throat clearing. NECK: Supple w/ fair ROM, JVD- none, normal carotid impulses w/o bruits Thyroid- normal to palpation CHEST: Clear to P&A HEART: RRR, no m/g/r heard ABDOMEN: Soft and nl; nml bowel sounds; no organomegaly or masses noted OZH:YQMV, nl pulses, no edema  NEURO: Grossly intact to observation         Impression & Recommendations:  Problem # 1:  COUGH (ICD-786.2) Rest throat and continue Tessalon perles.   Problem # 2:  ALLERGIC RHINITIS (ICD-477.9) Continue antihistamines. His updated medication list for this problem  includes:    Chlorpheniramine Maleate 4 Mg Tabs (Chlorpheniramine maleate) .Marland Kitchen... Take 3-4 daily   Medications Added to Medication List This Visit: 1)  Bayer Aspirin 325 Mg Tabs (Aspirin) .... Take 1 by mouth once daily 2)  Chlorpheniramine Maleate 4 Mg Tabs (Chlorpheniramine maleate) .... Take 3-4 daily 3)  Dexamethasone 1 Mg Tabs (Dexamethasone) .... Take 3 daily for 6 weeks 4)  Benzonatate 100 Mg Caps (Benzonatate) .Marland Kitchen.. 1 or 2 three times a day as needed for cough   Patient Instructions: 1)  Keep appt or call as needed.   ]

## 2010-11-28 ENCOUNTER — Other Ambulatory Visit: Payer: Self-pay | Admitting: Gastroenterology

## 2010-11-28 DIAGNOSIS — K922 Gastrointestinal hemorrhage, unspecified: Secondary | ICD-10-CM

## 2010-12-05 ENCOUNTER — Ambulatory Visit
Admission: RE | Admit: 2010-12-05 | Discharge: 2010-12-05 | Disposition: A | Discharge status: Home / Self Care | Payer: Medicare Other | Source: Ambulatory Visit | Attending: Gastroenterology | Admitting: Gastroenterology

## 2010-12-05 DIAGNOSIS — K922 Gastrointestinal hemorrhage, unspecified: Secondary | ICD-10-CM

## 2011-01-08 NOTE — Assessment & Plan Note (Signed)
[facility]                             PULMONARY OFFICE NOTE   Andrew Marshall, Andrew Marshall                        MRN:          [id number]  DATE:05/18/2007                            DOB:          [birth year]    PROBLEMS:  1. Allergic rhinitis.  2. Cough.  3. Esophageal reflux.  4. Prostate cancer.  5. Hypertension.   HISTORY:  He notices easy exertional dyspnea trying to get back to  regular bike riding, which he used to take seriously until he had his  prostate cancer treated. He says that his stress test was okay, but  abnormal Cardiolite. He then had a PFT at [facility], which showed  normal spirometry flows with minimal response to bronchodilator, normal  lung volumes with some hyperinflation, and mild reduction of diffusion  capacity. The overall pattern could suggest mild obstructive airways  disease/emphysema. Albuterol helps when used before he rides his bike  suggesting that there is a reactive airways component. We reviewed his  medications. He is now off of Vytorin, Nasonex, and Diovan.   OBJECTIVE:  Weight 193 pounds, blood pressure 146/78, pulse 53, room air  saturation 99%. Lungs sound clear, nasal airway is not congested. Heart  sounds regular without murmur.   IMPRESSION:  Allergic rhinitis, asthma/chronic obstructive pulmonary  disease.   PLAN:  It is okay from a pulmonary standpoint for him to resume regular  bike riding and we discussed what benefit he might expect from getting  back in better condition. Schedule return one year, earlier p.r.n.     Clinton D. Maple Hudson, MD, [facility] Bollman, FACP  Electronically Signed    CDY/MedQ  DD: 05/23/2007  DT: 05/24/2007  Job #: [id number]   cc:   Theressa Millard, M.D.  Francisca December, M.D.

## 2011-01-11 NOTE — Assessment & Plan Note (Signed)
[facility]                             PULMONARY OFFICE NOTE   BRICESON, BROADWATER                        MRN:          [id number]  DATE:11/10/2006                            DOB:          [birth year]    PROBLEM LIST:  1. Allergic rhinitis.  2. Cough.  3. Esophageal reflux.  4. Prostate cancer/XRT.   HISTORY:  Now getting radiation therapy for his prostate cancer.  He has  had a flu-like syndrome for the past ten days with a fever, weakness,  malaise, increased cough, but he is getting better on his own.  Otherwise, still frequently has dry cough, postnasal drip, and nasal  congestion.  He saw no real difference in this cough when he switched  lisinopril to Diovan for most of a month.  He says blood pressure at  home runs lower but I have asked him to follow up on his blood pressure  with Dr. Earl Gala   MEDICATIONS:  1. Tenormin one-half times 25 mg  2. Chlor-Trimeton 4 mg three or four times daily p.r.n.  3. Aspirin 325 mg  4. Fish oil  5. Coenzyme Q10  6. Zetia  7. Crestor  8. Exforge 5/320  9. Magnesium  10.Pepcid OTC   ALLERGIES:  No medication allergies.   OBJECTIVE:  Weight 196 pounds.  Blood pressure 170/82.  Pulse regular,  55.  Room air saturation 100 percent.  Wheezy congestion, loose cough.  No focal dullness or increased work of breathing.  I do not find  adenopathy.  There is mild nasal stuffiness.  Heart sounds are regular  without murmur.  No edema.   IMPRESSION:  Cough not related to an ACE inhibitor apparently.  I am  concerned about chronic obstructive pulmonary disease and reflux.  There  has also been some history of allergic rhinitis.  We are going to give  him time to get over what appears to be a self-resolving viral  exacerbation.  He is given a Z-Pak to hold after some discussion.  We  are scheduling return to  see how he is doing a little later in the spring pollen season.  He is  encouraged to follow up with  Dr. Earl Gala about his blood pressure.     Clinton D. Maple Hudson, MD, [facility] Bollman, FACP  Electronically Signed    CDY/MedQ  DD: 11/15/2006  DT: 11/15/2006  Job #: [id number]   cc:   Theressa Millard, M.D.  Francisca December, M.D.

## 2011-01-11 NOTE — Assessment & Plan Note (Signed)
[facility]                               PULMONARY OFFICE NOTE   JOSEDANIEL, HAYE                        MRN:          [id number]  DATE:06/06/2006                            DOB:          [birth year]    PULMONARY FOLLOWUP   PROBLEM:  1. Allergic rhinitis.  2. Cough.  3. Esophageal reflux.   HISTORY:  He says he is still coughing quite a bit, worst in the mornings.  Sputum is scant and clear.  He is aware of reflux and using Pepcid OTC  because he says the other frontline drugs all gave diarrhea.  He has not  tired a H2 blocker like Zantac and I have suggested he do so.  We discussed  lisinopril as a potential cause of cough.  He has not had urticaria.  He  does not notice wheezing.   MEDICATION:  1. Tenormin 12 mg.  2. Lisinopril 10 mg.  3. Vytorin 10/80.  4. Vitamins.  5. Chlor-Trimeton 4 mg, taking up to 3 to 4 a day at times.  6. Nasonex 1 or 2 sprays.  7. Aspirin 325 mg.  8. Fish oil.  9. Co-enzyme Q.  10.Pepcid OTC.   No medication allergy.   OBJECTIVE:  Weight 190 pounds.  BP 142/80.  Pulse regular 51.  Room air  saturation 97%.  Trace crackles at right base, cleared with deep breath.  Dry cough noted  intermittently.  His pharynx was clear.  I saw no evidence of postnasal drainage.  Heart sounds were regular without murmur.   IMPRESSION:  Cough may well be explained by either or both of his  angiotensin-converting enzyme inhibitor and his reflux.   PLAN:  1. I have given him samples of Diovan 160 mg to take 1/2 daily instead of      lisinopril.  He is to call Dr. Amil Amen to clear this and to renew the      prescription if that turns out to be an appropriate long-term strategy.  2. Try Zantac for his esophageal reflux problems while also following      reflux precautions.  3. Flu vaccine was discussed and given.  4. Return once more in a month, earlier p.r.n.       Clinton D. Maple Hudson, MD, [facility], FACP      CDY/MedQ  DD:  06/07/2006  DT:  06/09/2006  Job #:  [id number]   cc:   Theressa Millard, M.D.  Francisca December, M.D.

## 2011-01-11 NOTE — Assessment & Plan Note (Signed)
[facility]                               PULMONARY OFFICE NOTE   DIN, BOOKWALTER                        MRN:          [id number]  DATE:04/25/2006                            DOB:          [birth year]    PROBLEM:  This is a 77 year old man previously seen by me years ago at  [facility] and coming now asking allergy evaluation because of  persistent coughing.  His primary physician is Dr. Theressa Millard.   HISTORY:  He estimates 15 or more years ago he came to me for allergy  evaluation at [facility].  Skin testing was positive  but not strongly so, and we chose to treat over the counter.  He says this  February was unusually bad with persistent nasal congestion but no wheezing.  His pattern in recent years has been perennial dry cough, sometimes with  scant light yellow sputum and a more pronounced seasonal head and chest  congestion, evident in the last two years, with postnasal drainage.  He  prefers Chlor-Trimeton, which he uses 2-3 times a day and sometimes  Benadryl.  He has used Claritin and tried Singulair but not nasal steroids.  Triggers he considers to be moldy environments, animal danders, and house  dust, such that he avoids being around during housekeeping.  He is also  distinctly worse when pollen is visible on surfaces outside in the spring  and fall.   REVIEW OF SYSTEMS:  He denies shortness of breath or significant productive  cough and has had nothing bloody or frankly purulent.  He does notice acid  indigestion.  There is no significant itching or irritation of eyes or ears.  No chest pain or palpitation.  No weight loss, nausea, vomiting, rash,  adenopathy, or edema.  His breathing rarely wakes him.   MEDICATIONS:  Tenormin, lisinopril, Vytorin, multivitamins, Chlor-Trimeton.   No drug allergies.   PAST MEDICAL HISTORY:  1. Coronary disease with myocardial infarction and stents placed  in 2001      and 2003 (Dr. Corliss Marcus).  2. Recent fusion foot surgery (Dr. Aldean Baker).  3. Recent diagnosis of prostate cancer (Dr. Boston Service).  4. Past history of reflux.   No history of sinusitis or diagnosed asthma, blood clots, or tuberculosis  exposure.  No problems with urticaria, unusual insect sting reactions,  foods, aspirin, latex, contrast dye.   SOCIAL HISTORY:  Quit smoking in 2001.  Drinks three glasses of wine daily.  Married.  Retired.  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  He has been an active bike rider and in the past had been a runner.   FAMILY HISTORY:  Father with heart disease.  He is not aware of others with  significant allergy or asthma problems.   OBJECTIVE:  VITAL SIGNS:  Weight 187 pounds.  BP 108/78, pulse regular at  58, room air saturation 97%.  GENERAL:  He walks a little carefully in prosthetic shoes but otherwise  looks quite comfortable.  SKIN:  Clear.  ADENOPATHY:  None found at the neck, shoulders, or axillae.  HEENT:  Conjunctivae clear.  Nasal mucosa is wet with some clear mucus  bridging but no polyps and no significant edema.  Pharynx is clear.  Voice  quality is normal with no neck vein distention or stridor.  CHEST:  Quiet, clear lung fields.  HEART:  Heart sounds are regular without murmur or gallop.  EXTREMITIES:  No cyanosis or clubbing.  No evident edema.   IMPRESSION:  1. Perennial and seasonal allergic rhinitis.  2. Mild occasional cough, which may reflect postnasal drainage, his      previously identified reflux or possibly a minimal asthma.   PLAN:  1. He will continue Chlor-Trimeton, since that works well for him, but we      have discussed others.  2. Try Nasonex two sprays each nostril daily with sample and prescription      provided.  3. Environmental precautions and dust control, which will be reemphasized.  4. Return in six weeks followup, earlier p.r.n.  5. He will help decide if  he is having enough trouble later in the fall      season to justify allergy skin testing, but he may be able to continue      with symptomatic control.                                   Clinton D. Maple Hudson, MD, FCCP, FACP   CDY/MedQ  DD:  04/26/2006  DT:  04/28/2006  Job #:  [id number]   cc:   Nadara Mustard, MD  Theressa Millard, MD  Boston Service, MD  Francisca December, MD

## 2011-01-11 NOTE — Consult Note (Signed)
[name]  Patient:    Andrew Marshall, Andrew Marshall                          MRN: [id number] Proc. Date: 05/22/00 Adm. Date:  [id number] Attending:  Corliss Marcus CC:         Winn Jock. Earl Gala, M.D.   Consultation Report  REASON FOR CONSULTATION:  Apparent posterior wall myocardial infarction.  HISTORY OF PRESENT ILLNESS:  Andrew Marshall is a 77 year old man who is quite physically active but with history of hypertension, who developed acute anterior substernal chest pain approximately 4:30 a.m. on the 27th.  This was associated with shortness of breath and diaphoresis.  He presented to [facility] within about 1-1/2 hours, where IV heparin, IV nitroglycerin, and aspirin have failed to relieve his chest discomfort. Initial EKG showed slight ST segment elevation in the inferior leads but with severe ST depression in V1, V2, and V3.  These became progressive over the next 45 minutes, and there was a strong suspicion of an inferior posterior wall myocardial infarction.  Initial CK-MB is negative.  He continues to have chest pain in a 3-4 out of 10 range.  There are plans for urgent transfer to the catheterization laboratory for acute intervention.  Over the past month, he has noted intermittent indigestion-type discomfort, only at night, usually while reading.  He has had none with exertion.  The pain after becoming more acute this morning did move across both shoulders.  PAST MEDICAL HISTORY: 1. Hypertension. 2. Recent dental surgery. 3. History of paroxysmal atrial fibrillation. 4. 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 5. Laryngeal CA, superficial. 6. Restless legs syndrome.  SOCIAL HISTORY:  He is married for the past 12 years.  One son from a prior marriage.  Smokes one-half pack of cigarettes per day.  Three to four glasses of wine every day.  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  FAMILY HISTORY:  Mother died at age 73, had angina.   Father died at age 84 of uncertain causes.  One brother, alive and well.  MEDICATIONS:  Tenormin 50 mg p.o. q.d., cephalexin 500 mg p.o. q.i.d., aspirin 325 mg p.o. q.d., digoxin 0.25 mg p.o. q.d.  DRUG ALLERGIES:  ERYTHROMYCIN causes GI upset.  REVIEW OF SYSTEMS:  He rides his bicycle about 20 miles a day and has not noticed any exertional chest discomfort.  Otherwise, all other systems are negative.  PHYSICAL EXAMINATION:  VITAL SIGNS:  Blood pressure 152/95, falling to 145/87 with IV nitroglycerin, heart rate 65 and regular, temperature afebrile, respirations 20.  GENERAL:  This is a well-developed, well-nourished 77 year old man in mild distress secondary to anterior chest pain, diaphoretic.  HEENT:  Head is normocephalic, atraumatic.  Pupils equal, round and reactive to light and accommodation.  Extraocular movements are intact.  Oral mucosa is pink and moist.  Sclerae are nonicteric.  The tongue is not coated.  NECK:  Supple without thyromegaly or masses.  The carotid upstrokes are normal.  There is no bruit.  There is no jugular venous distention.  CHEST:  Clear bilaterally with good excursion.  Normal vesicular breath sounds are heard throughout.  HEART:  The precordium is quiet.  Normal S1 and S3 is heard.  No S3, S4, murmur, click, or rub noted.  PMI is not palpable.  ABDOMEN:  Soft, flat, and nontender.  No hepatosplenomegaly or midline pulsatile masses.  Bowel sounds are present throughout.  GENITALIA:  Normal male phallus, descended testicles, no lesions.  RECTAL:  Not performed.  EXTREMITIES:  Full range of motion, no edema, intact distal pulses.  NEUROLOGIC:  Cranial nerves II-XII are intact.  Motor and sensory are grossly intact.  Gait not tested.  SKIN:  Warm, moist, clear of any gross lesions.  LABORATORY DATA:  Initial hemoglobin was normal as well as serum electrolytes, BUN and creatinine.  Glucose is 157.  Liver-associated enzymes normal. Initial  CK 95, MB is 4.  Digoxin is 0.3.  Electrocardiogram shows ST depression, downsloping in V1, 2, 3, 4, and 5.  No significant ST elevation in the inferior or lateral limb leads.  Chest x-ray: No active cardiopulmonary disease.  IMPRESSION: 1. Likely acute posterior wall myocardial infarction associated with a    complete occlusion of the left circumflex artery or marginal branch. 2. History of hypertension. 3. History of paroxysmal atrial fibrillation. 4. Restless legs syndrome. 5. Hemodynamically stable at this point.  PLAN/RECOMMENDATIONS:  We will proceed with an urgent transfer to the cardiac catheterization laboratory for coronary angiography and possible PTCA.  Goals and risks have been discussed with the patient.  He states his understanding and wishes to proceed.  Agree with your management thus far, including IV heparin, nitroglycerin, and aspirin, as well as morphine for pain relief.  Thank you very much for allowing me to assist in the care of Andrew Marshall. It has been a pleasure to do so.  I will discuss his further care with you. DD:  05/22/00 TD:  05/22/00 Job: [id number]

## 2011-01-11 NOTE — Cardiovascular Report (Signed)
[place]. [facility]  Patient:    Andrew Marshall, Andrew Marshall                          MRN: [id number] Proc. Date: 12/11/00 Adm. Date:  [id number] Disc. Date: [id number] Attending:  Corliss Marcus CC:         Theressa Millard, M.D.  Cardiac Catheterization Laboratory   Cardiac Catheterization  PROCEDURES PERFORMED: 1. Left heart catheterization. 2. Coronary angiography. 3. Left ventriculogram. 4. Percutaneous coronary intervention/stent implantation, mid left circumflex.  INDICATIONS:  Andrew Marshall is a 77 year old man, who is now six months status post a posterior wall infarction, treated by direct angioplasty with stent implantation.  The patient has remained relatively asymptomatic with the exception of an episode of chest tightness on November 10, 2000.  He underwent a exercise Cardiolite on November 28, 2000, which revealed the patients ability to exercise for 12 minutes and 15 seconds utilizing a Bruce protocol and achieving a heart rate of 143 beats per minute, which was 93% of maximum predicated.  The patient did not experience any symptoms; however, there was a reversible posterior wall defect.  He is brought now to the catheterization laboratory to identify any possible re-stenosis or progressive disease and provide further therapeutic options.  DESCRIPTION OF PROCEDURE:  Left heart catheterization was performed following the percutaneous insertion of a 5 French catheter sheath utilizing an anterior approach over a guiding J wire into the right femoral artery.  A 110 cm pigtail catheter was used to measure pressures in the ascending aorta and in the left ventricle, both prior to and following the ventriculogram.  A 30 degree RAO, as well as a 60 degree LAO, 8 degree caudal cranial angulated left ventriculograms were performed utilizing the power injector.  Following the sublingual administration of 0.4 mg nitroglycerin, coronary angiography was performed utilizing 5  [place], #4 left and right Judkins catheters. Cineangiography of each coronary was conducted in multiple LAO and RAO projections.  We then proceeded with preparations for coronary intervention.  The 5 French catheter sheath was exchanged over a long guiding J wire for a 6 French catheter sheath.  A 5 French catheter sheath was inserted into the right femoral vein utilizing an anterior approach over a guiding J wire for intravenous access.  The patient received 4000 units of heparin as well as a double bolus of Integrilin in constant infusion.  Initial ACT was 244 seconds. A 6 [place] Voda, 4.0, left guiding catheter was advanced to the ascending aorta where the left coronary os was engaged.  A 0.014 inch, Scimed, luge, I intracoronary guidewire was passed across the lesion without difficulty. Initial balloon dilatations were performed with a 3.25/10 mm Scimed Cutting Balloon.  Three inflations to 8 atmospheres were made in the more stenotic part of the lesion, each for 1-1/2 minutes.  The balloon was then withdrawn more into the previously stented segment, and three inflations made there to a peak pressure of 10 atmospheres for a peak duration of 1-1/2 minutes each. These maneuvers, unfortunately, resulted in a dissection which was nonobstructive.  This was treated with a 3.0/12 mm Scimed, NIR intracoronary stent.  This was placed across the site of the most severe stenosis which was just distal to the previously placed stent.  It was deployed there at a peak pressure of 16 atmospheres for approximately one minute.  This resulted in wide patency and resolution in the dissection in the circumflex artery.  The  wire was withdrawn and advanced marginal branch which arose just prior to the newly placed stent.  The stent balloon was advanced without difficulty into the origin of this marginal branch and there was inflated to 4 atmospheres for one minute.  Following this, the balloon and guidewire  were removed. Following conformation of adequate patency in orthogonal views, both with and without the guidewire in place, the guiding catheter was removed.  Hemostasis was achieved by suturing the sheath in place.  The patient was transported to the recovery area in stable condition with intact distal pulses.  HEMODYNAMICS:  Systemic arterial pressure was 108/50 with a mean of 68 mmHg. There was no systolic gradient across the aortic valve.  The left ventricular end-diastolic pressure was 14 mmHg pre and post ventriculogram.  ANGIOGRAPHY:  LEFT VENTRICULOGRAM:  The left ventriculogram demonstrated normal chamber size and intact global systolic function without regional wall motion abnormalities; this included in the LAO projection.  There was catheter-induced mitral regurgitation in the RAO projection.  There was left coronary claudication and the previously placed stent was easily visible. Estimated ejection fraction is 75%.  There was a right dominant coronary system present.  The main left coronary artery was normal.  The left anterior descending artery and its branches were normal.  The left circumflex and its branches were highly diseased.  This vessel had mild diffuse in-stent re-stenosis of about 30-40%, but then just distal to the end of the stent segment and after the origin of a large to moderate sized marginal branch, there was a 70% eccentric stenosis.  The ongoing circumflex then gave rise to a trifurcating marginal branch of the posterior wall of the heart.  The right coronary artery and its branches were minimally diseased.  There is a 20-30% stenosis in the midportion.  There is an extremely focal 20% stenosis at the junction of the mid and distal segments.  Moderate sized posterior descending artery and posterolateral segment arise.  There are three posterolateral or LV branches.  Following balloon dilatation and stent implantation in the left  circumflex  there was no residual stenosis.  There was a 30-50% stenosis at the origin of the first marginal branch.  FINAL DIAGNOSES: 1. Atherosclerotic coronary vascular disease, single-vessel. 2. Status post successful percutaneous transluminal coronary angioplasty and    stent implantation, mid left circumflex coronary artery. 3. Typical angina was reproduced with device insertion and balloon    inflation. DD:  12/11/00 TD:  12/12/00 Job: [id number]

## 2011-01-11 NOTE — Cardiovascular Report (Signed)
[place]. [facility]  Patient:    Andrew Marshall, Andrew Marshall Visit Number: [id number] MRN: [id number]          Service Type: CAT Location: [phone] Attending Physician:  Corliss Marcus Dictated by:   Francisca December, M.D. Proc. Date: 07/09/01 Admit Date:  07/09/2001   CC:         Theressa Millard, M.D.  Cardiac Catheterization Laboratory   Cardiac Catheterization  PROCEDURES PERFORMED: 1. Left heart catheterization. 2. Coronary angiography. 3. Left ventriculogram. 4. Percutaneous transluminal coronary angiography/cutting balloon of left    circumflex marginal branch. 5. Intravascular brachytherapy, left circumflex marginal branch.  INDICATIONS:  Andrew Marshall is a 77 year old man who is extremely vigorous and does long-distance bicycle riding.  He had a posterior wall myocardial infarction in September 2001, treated with direct angioplasty with subsequent restenosis and repeat PTCA and stent implantation in April 2002.  A myocardial perfusion study to followup on this lesion after six months was significant for recurrent ischemia in the lateral and inferolateral wall.  The patient was, however, able to exercise vigorously without symptoms.  After a lengthy discussion and full consideration of the risks, benefits, and alternatives, the patient was elected to proceed with repeat angiography and possible intracoronary vascular brachytherapy for more definitive treatment and revascularization of this ischemic burden.  This seems more important given his very vigorous and active lifestyle.  PROCEDURAL NOTE:  Left heart catheterization was performed following the percutaneous insertion of a 5-French catheter sheath utilizing an anterior approach over a guiding J-wire into the right femoral artery.  A 110 cm pigtail catheter was used to measure pressures in the ascending aorta and in the left ventricle both prior to and following the ventriculogram.  A 30 degree RAO  cine left ventriculogram was performed utilizing a power injector. Coronary angiography was then performed using 5-French #4 left and right Judkins catheters.  Cineangiography of both coronary arteries was conducted in multiple LAO and RAO projections.  After the intracoronary administration of 0.2 mg of nitroglycerin, coronary angiography was repeated in the left coronary utilizing the 5-French #4 left Judkins.  At this point, it was elected to proceed with catheter-based revascularization and brachytherapy for a long diffuse, but not exceptionally stenotic degree of in-stent restenosis. The lesion covered approximately 25 mm and amounts to two previously placed stents which are slightly overlapping, but in tandem.  The greatest degree of stenosis within this stented segment is felt to be approximately 60%.  The 5-French catheter sheath was exchanged over a long guiding J-wire for a 7-French catheter sheath.  A 7-French FL-4 Sci-Med Wiseguide guiding catheter was advanced to the ascending aorta where left coronary os was engaged.  The patient received 4000 units of heparin intravenously and a double bolus of Integrilin, as well as constant infusion.  A 0.014 inch Sci-Med luge intracoronary guide wire was passed across the lesion without difficulty. Initial balloon dilatation was made with a 3.25 x 15 mm Sci-Med cutting balloon.  It was inflated throughout the stenotic segment a total of six times to a maximum pressure of 8 atm for a maximum duration of two minutes.  This device was removed and in concert with Dr. Kathrin Greathouse of the radiology/oncology department, the patient underwent intravascular brachytherapy.  A 40 mm source train was utilized and the dwell time was 3 minutes and 16 seconds.  This procedure was monitored by radiation physicist and there was complete withdrawal of the radiation source documented both angiographically and by  external radiation source monitoring.  At  this point, the brachytherapy and the guide wire were removed. Cineangiography was repeated and confirmed adequate patency in orthogonal views.  The guiding catheter was then removed and the catheter sheath was sutured into place.  The patient was transported to the recovery area in stable condition with an intact distal pulse.  Initial ACT was 321 seconds, falling to 276 seconds at the completion of the procedure.  HEMODYNAMICS:  Systemic arterial pressure was 115/60, with a mean of 81 mmHg. There was no systolic gradient across the aortic valve.  The left ventricular end-diastolic pressure was 13 mmHg preventriculogram and 15 mmHg postventriculogram.  ANGIOGRAPHY:  The left ventriculogram demonstrated normal chamber size and normal global systolic function without regional wall motion abnormality.  The calculated ejection fraction was 76% utilizing a single-plane cine method. There was 1-2+ mitral regurgitation.  This was possibly catheter induced.  There was a right dominant coronary system present.  The main left coronary artery was normal.  The left anterior descending artery and its branches were normal.  The left circumflex artery and its branches were highly diseased; the vessel bifurcates into two marginals, the first of which is small to moderate in size and the second of which is large.  The stented segment begins in the proximal circumflex and extends into the second marginal, covering the origin of the first marginal.  There is diffuse in-stent restenosis present with the maximum stenosis of approximately 60% seen mostly demonstrated after the IC injection of nitroglycerin.  Prior to this, there was relative diffuse vasospasm in the circumflex marginal system giving the impression of no significant in-stent restenosis.  The right coronary artery and its branches were minimally diseased; there is a 20-30% stenosis in the mid portion which is focal.  There is an  extremely  focal 20% stenosis at the junction of the mid and distal segments.  A moderate sized posterior descending artery and posterolateral segment arise.  There are three small posterolateral or LV branches without significant obstruction.  Following cutting balloon dilatation and intravascular brachytherapy in the left circumflex marginal branch system, there was a 5% residual stenosis.  FINAL IMPRESSION: 1. Atherosclerotic coronary vascular disease, single-vessel. 2. Intact left ventricular size and global systolic function, with ejection    fraction of 76%. 3. Status post successful percutaneous transluminal coronary angiography and    cutting balloon with intravascular brachytherapy of left circumflex    marginal system. 4. Typical angina was reproduced with device insertion and balloon inflation. Dictated by:   Francisca December, M.D. Attending Physician:  Corliss Marcus DD:  07/09/01 TD:  07/09/01 Job: [id number]

## 2011-01-11 NOTE — Assessment & Plan Note (Signed)
[facility]                               PULMONARY OFFICE NOTE   KANYE, DEPREE                        MRN:          [id number]  DATE:07/04/2006                            DOB:          1931/01/13    PULMONARY FOLLOWUP   PROBLEMS:  1. Allergic rhinitis.  2. Cough.  3. Esophageal reflux.   HISTORY:  Returns for followup of his multifactorial cough, saying it is 10  to 15% better with change from lisinopril to Diovan taking 1/2 x160 mg, but  he noticed his blood pressure is up.  He ran out of Diovan today.  He has  been taking daily Chlor-Trimeton up to 4 or 5 tablets a day.  House dust  exposure triggers marked sinus congestion and wheeze.  He says if he tries  to vacuum in his home it is agony.  Other antihistamines have not been as  effective as the Chlor-Trimeton.  Nasonex was some help.  Meds did not make  him drowsy.  He has tried 3 different proton pump inhibitors, none  tolerated.  Pepcid OTC was not enough.  Still actively aware of reflux.   MEDICATIONS:  1. Tenormin 1/2 x25 mg tab.  2. Vytorin 10/80.  3. Chlor-Trimeton.  4. Nasonex.  5. Aspirin 325 mg.  6. Fish oil.  7. Coenzyme Q.  8. Diovan 1/2 x160 mg samples.  9. Pepcid OTC.   No medication allergies.   OBJECTIVE:  Weight 192 pounds, BP 172/94 in the left arm, 178/82 right arm.  Pulse regular 57.  Room air saturation 99%.  Nasal airway and chest were clear with no cough demonstrated.  Throat may be a little red, not much.  No visible drainage.  No wheeze.   IMPRESSION:  His cough may not really be related to lisinopril as an  angiotensin-converting enzyme inhibitor.  It sounds as if allergic rhinitis  and esophageal reflux alone would be sufficient explanation.  I am surprised  that a change from lisinopril 10 mg to Diovan 80 mg was not more comparable  in blood pressure control.  He understands that I want his other physicians  to remain primarily in charge of his  blood pressure and we simply made a  medication substitution to assess the cough.   PLAN:  1. Environmental precautions were reviewed.  He had had allergy skin      testing remotely.  This could be redone if needed.  2. Change Diovan back to his existing supply of lisinopril 10 mg paying      attention to any change in cough.  3. Follow up on blood pressure with Dr. Earl Gala and Dr. Amil Amen.      Meanwhile increase atenolol to 25 mg daily.  4. Try Zantac or Tagamet for symptom tolerance of reflux control by      following reflux precautions and discuss this with Dr. Earl Gala.  5. Schedule return in 4 months but earlier p.r.n.     Clinton D. Maple Hudson, MD, Tonny Bollman, FACP  Electronically Signed    CDY/MedQ  DD: 07/06/2006  DT:  07/06/2006  Job #: [id number]   cc:   Theressa Millard, M.D.  Francisca December, M.D.

## 2011-01-11 NOTE — Op Note (Signed)
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  CT scan and  bone scan negative for metastatic disease.  Laparoscopic robotic  prostatectomy consultation by Sherene Sires  MD, April 29, 2006;  consultation with Chipper Herb, MD, the patient elected ERT plus seed  implant.  Previous MI, September of 2001.   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  CT scan and  bone scan negative for metastatic disease.  Laparoscopic robotic  prostatectomy consultation by Sherene Sires  MD, April 29, 2006;  consultation with Chipper Herb, MD, the patient elected ERT plus seed  implant.  Previous MI, September of 2001.   PROCEDURE:  Seed implant.   SURGEONS:  Boston Service, M.D. and Maryln Gottron, M.D.   ASSISTANT:  None.   ANESTHESIA:  General.   SPECIMENS:  None.   ESTIMATED BLOOD LOSS:  Minimal.   COMPLICATIONS:  None obvious.   DRAIN:  An 18-French Foley.   DESCRIPTION OF PROCEDURE:  The patient was prepped and draped in the  dorsal lithotomy position after institution of adequate level of general  anesthesia.  A rectal tube, transrectal ultrasound probe, suprapubic  fluoroscopy unit, and indwelling Foley catheter were all positioned.  Duplicate transverse and longitudinal scans of the prostate were  obtained; and Nucletron sonographic imaging and planning of oncology  seed placement was carried out.  The total number of needles 22; total  number of  seeds 52; activity per seed 15.2880 mCi per seed of an  radioactive I-125.  Seeds were then placed according to prearranged  coordinates.  A seed at J-6 was not able to advance due to the spacers;  and it was placed manually.  At the end, fluoroscopy unit confirmed  appropriate placement of the seeds.  The transrectal ultrasound unit and  rectal tube was removed, as was the indwelling Foley catheter.   Fiberoptic cystoscopy confirms a normal urethra, mildly hyperemic  prostatic mucosa, clear reflux right and left orifice.  No blood within  the bladder; specifically no evidence of seeds, spacers, bladder  perforation, or other abnormality within the bladder.  Cystoscope was  used to fill the bladder to capacity.  The scope was then removed and an  18-French Foley was inserted and left to straight drain with immediate  return of several hundred milliliters of clear irrigant.   PLAN:  Will leave Foley catheter to straight drain overnight, voiding  trial, tomorrow, with Dr. Wanda Plump in the office.           ______________________________  Teodoro Kil  Wanda Plump, M.D.     RH/MEDQ  D:  11/27/2006  T:  11/27/2006  Job:  [id number]   cc:   Maryln Gottron, M.D.  Fax: [phone]   Theressa Millard, M.D.  Fax: [phone]

## 2011-01-11 NOTE — Assessment & Plan Note (Signed)
[facility]                             PULMONARY OFFICE NOTE   BRAZOS, SANDOVAL                        MRN:          [id number]  DATE:12/19/2006                            DOB:          [birth year]    PROBLEM LIST:  1. Allergic rhinitis.  2. Cough.  3. Esophageal reflux.  4. Prostate cancer, radiation.   HISTORY:  He returns off of antihistamines for skin testing, reporting  some benefit from nasal steroid which is used intermittently.  He has  had prostate seeds implanted. Thinks he is more short of breath now  gradually, climbing hills.  He recognizes he has not had regular  exercise bike riding in a year.   MEDICATIONS:  1. Atenolol 25 mg.  2. Exforge 5/320.  3. Zetia 10 mg.  4. Crestor 20 mg.  5. Aspirin 325 mg.  6. Chlor-Trimeton 4 mg 3-4 times daily which he has used for many      years and says does not make him sleepy.   OBJECTIVE:  VITAL SIGNS: Weight 190 pounds, blood pressure 140/76, pulse  regular at 59, room air saturation 98%.  Pulse regular.  LUNGS:  Clear.  HEENT:  A little watery sniffing, not significantly obstructed.   SKIN TEST:  Significant positive reaction, particularly for grass, weed,  and tree pollens, but strongest reactions are for dust and dust mite as  well as cockroach.  He does react some to cat and has a cat.  We  reviewed these findings and discussed environmental precautions,  emphasizing dust,  mold, and pollen exposures.  He says his home is  dusty.  Information was given.   IMPRESSION:  Allergic rhinitis with cough.   PLAN:  1. PFT including 6-minute walk test.  2. We discussed Claritin as an alternative to his chronic use of Chlor-      Trimeton.  3. We discussed the availability of allergy vaccine if symptoms are      not controlled well enough.  He will watch through the summer and      return in the fall, earlier p.r.n.     Clinton D. Maple Hudson, MD, Tonny Bollman, FACP  Electronically Signed    CDY/MedQ  DD: 12/21/2006  DT: 12/21/2006  Job #: [id number]   cc:   Theressa Millard, M.D.  Francisca December, M.D.

## 2011-01-11 NOTE — Discharge Summary (Signed)
[place]. [facility]  Patient:    Andrew Marshall, Andrew Marshall                          MRN: [id number] Adm. Date:  [id number] Disc. Date: [id number] Attending:  Corliss Marshall Dictator:   Andrew Marshall, N.P. CC:         Andrew Marshall, M.D.   Discharge Summary  PROCEDURES:  (May 22, 2000): PTCA/stent proximal left circumflex.  HOSPITAL COURSE:  #1 - ACUTE LATERAL/POSTERIOR MYOCARDIAL INFARCTION:  Andrew Marshall is a pleasant 77 year old male who presented to [facility] Emergency Room approximately two hours into what appeared to be an acute posterior wall myocardial infarction.  EKG revealed ST segment depression in V1, V2, V3, and V4.  He has been hemodynamically stable.  He did not respond completely to IV heparin nor IV nitroglycerin.  He was taken urgently for cardiac catheterization by Dr. Corliss Marshall where he underwent PTCA/stent of the proximal circumflex with reduction of stenosis from 100% to 10% residual.  Other coronary arteries revealed a short but normal left main, luminal irregularities only in the LAD, luminal irregularities only on the RCA.  He had intact global LV systolic function with estimated ejection fraction of approximately 65 to 70%. No coronary calcification was identified.  On subsequent days, he was ambulating about cardiac rehabilitation without problems.  He is interested in phase 2 as an outpatient.  #2 - POST REPERFUSION ARRHYTHMIAS:  The patient was having runs of nonsustained ventricular tachycardia of 6 to 10 beats, but this became less frequent over the course of the first hospital day.  He peak CK was 2904 with MB fraction of 350 and troponin I 15.84.  #3 - HYPERTENSION:  Good control under current medical regimen.  #4 - REMOTE HISTORY OF ATRIAL FIBRILLATION FOR WHICH HE WAS ON DIGOXIN:   No evidence of recurrence of this during this hospitalization.  PLAN:  The patient is discharged to home in stable and improved  condition.  DISCHARGE MEDICATIONS: 1. (New) Plavix 75 mg 1 p.o. q.d. for 21 days. 2. (New) Enteric-coated aspirin 325 mg once daily. 3. (New) Lisinopril 10 mg p.o. q.d. 4. (New) Atenolol 50 mg 1 p.o. q.d (or Tenormin). 5. (New) Nitroglycerin lingual spray 0.4 mg per dose.  Spray on or under    tongue p.r.n. chest pain, may repeat every 5 minutes up to three sprays    in 15 minutes. 6. (New) Digoxin 0.125 mg p.o. q.d.  ACTIVITY:  As outlined by cardiac rehabilitation nurse.  DIET:  Low fat, low cholesterol.  WOUND CARE:  May shower.  He is to call our clinic if he develops large amount of swelling or bruising in the groin area.  SPECIAL INSTRUCTIONS:  The patient is to take it easy until after he is seen by Dr. Amil Marshall in clinic.  No driving until then.  FOLLOWUP:   Dr. Corliss Marshall on Tuesday, October 9, at 9 oclock in the morning.  He needs to have a fasting lipid profile in approximately six weeks.  LABORATORY TESTS AND DATA:  First CK 95, MB fraction 4, troponin I less than 0.03.  Second CK 2904 with MB fraction 349.8, troponin I 6.48.  Third CK 1654, MB fraction 209.3, and troponin I 15.84.  Digoxin level was 0.3.  WBC on admission 13.0, subsequently measured at 12.7.  Hemoglobin 16, 13.9 at time of discharge.  Hematocrit 37, was 37.4 at time of discharge.  Platelets 245. Admission sodium 143, potassium 4.4, chloride 106, glucose 146, BUN 16, creatinine 0.9.  LFTs within normal range.  Chest x-ray revealed COPD without acute findings, left upper lobe granuloma which appeared benign.  EKG revealed slight ST elevation and coving inferiorly with septal ST downsloping V1 through V2, progressed EKG changes suspicious for inferior/posterior myocardial infarction.  PAST MEDICAL HISTORY: 1. Hypertension for 15 years. 2. Three years ago, supraventricular tachycardia, question atrial    fibrillation.  Treated with digoxin without recurrence. 3. Recent dental implant of two  teeth, left lower jaw, one week earlier.    Prior implant, right lower jaw, in past by Dr. Gwendlyn Marshall; ______    is his dentist. 4. 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 5. History of restless leg syndrome. DD:  06/05/00 TD:  06/07/00 Job: [id number]

## 2011-01-11 NOTE — Cardiovascular Report (Signed)
[place]. [facility]  Patient:    Andrew Marshall, Andrew Marshall Visit Number: [id number] MRN: [id number]          Service Type: CAT Location: 6500 [id number] Attending Physician:  Corliss Marcus Dictated by:   Francisca December, M.D. Proc. Date: 01/28/02 Admit Date:  01/28/2002 Discharge Date: 01/29/2002   CC:         Cardiac Catheterization Laboratory  Theressa Millard, M.D.   Cardiac Catheterization  INDICATION:  Andrew Marshall is a 77 year old man with known single-vessel coronary artery disease, who has undergone multiple previous left circumflex interventions including a stent at the time of an acute posterior wall myocardial infarction presentation in November 2001.  He had a second stent placed more distally to the first in April 2002 and then finally required cutting balloon angioplasty and brachytherapy for restenosis in November 2002. He is a very vigorous individual and exercises on a regular basis.  He does not have angina.  He underwent an exercise myocardial perfusion study which showed reversible inferoposterior wall defect.  After lengthy consideration, he is inclined and I agree, with returning to the cardiac catheterization laboratory and repeating angiography in order to consider the possibility of placing a drug-eluding stent if feasible.  PROCEDURE NOTE:  The patient was brought to the cardiac catheterization laboratory in the postabsorptive state.  The right groin was prepped and draped in the usual sterile fashion.  Local anesthesia was obtained with an infiltration of 1% lidocaine.  A 6 French catheter sheath was inserted percutaneously into the right femoral artery utilizing an anterior approach with a guiding J wire.  A 110 cm pigtail catheter was then used to measure pressures in the ascending aorta and in the left ventricle both prior to and following the ventriculogram.  A 30 degree RAO cine left ventriculogram was performed utilizing a power  injector.  Following the sublingual administration of 0.4 mg nitroglycerin, cine angiography of both coronary arteries was conducted in multiple LAO and RAO projections utilizing 6 [place] #4 left and right Judkins catheters.  We then proceeded with coronary intervention.  The 6 French catheter sheath was exchanged for a 7 French catheter sheath over a long guiding J wire.  The patient received 4100 units of heparin intravenously as well as a double bolus of Integrilin constant infusion.  A 7 [place] Scimed Wiseguide Voda #4 left guiding catheter was advanced to the ascending aorta where the left coronary os was engaged.  A 0.01 four-inch Scimed Choice PT Graphix intracoronary guidewire was passed across the complete occlusion in the second marginal branch without difficulty.  Initial balloon dilatation was performed with a 2.5/20 mm Scimed Maverick.  This was inflated in four locations to a maximum pressure of 6 atmospheres for a maximum duration of 46 seconds.  The Maverick balloon was then removed and a Scimed cutting balloon was advanced into the segment where a previous stent had been placed in the second marginal branch.  The cutting balloon was inflated in four locations over approximately 15 mm to a maximum pressure of 6 atmospheres for a maximum duration of 90 seconds.  A total of four inflations were made.  The cutting balloon was removed, and a 3.0/24 mm Cordis Cypher drug-eluding stent was then advanced into position in the second marginal branch.  It was placed from the bifurcation of the first marginal branch distally.  This was planned to cover the entire treated segment of the coronary artery.  Following balloon dilatation and stent implantation, adequate  patency was confirmed in orthogonal views, and the guidewire was removed.  The guiding catheter was then removed; the sheath was sutured into place.  The patient was transported to the recovery area in stable condition with an  intact distal pulse.  Closing ACT was 261 seconds.  Initial ACT was 297 seconds.  HEMODYNAMICS:  Systemic arterial pressure was 153/69 with a mean of 100 mmHg. There was no systolic reading across the aortic valve.  The left ventricular and diastolic pressure was 24 mmHg pre and postventriculogram.  ANGIOGRAPHY:  The left ventriculogram demonstrated normal chamber size and normal global systolic function.  Visual estimate of the ejection fraction is 55%.  There is no regional wall motion abnormality.  The previously-placed stents of left circumflex coronary are easily visualized.  There was 1+ mitral regurgitation present.  There was a right dominant coronary system present.  The main left coronary artery was normal.  The left anterior descending artery and its branches were essentially normal. A few luminal irregularities are seen; no significant obstructions within the proximal and mid portion.  A single large diagonal branch arises without significant obstruction.  The left circumflex coronary artery and its branches was highly diseased; the vessel gives rise to two marginal branches, the first of which has a 30% stenosis at its origin, the second of which is completely occluded at the proximal portion of the previously-stented segment.  There is a stent in the very proximal portion of the left circumflex which is widely patent and is the initial stent that was placed in December 2001.  Left to left collateral flow is seen filling the distal obtuse marginal branches.  The right coronary artery and its branches are minimally diseased; there is a napkin-ring-like lesion of about 20-30% in the mid to distal segment.  The distal right coronary gives rise to a moderate size posterior descending artery and moderate size posterolateral segment with two small posterolateral branches.  No significant obstructions are seen within this vessel.  Following balloon dilatation and stent  implantation in the circumflex marginal  branch, there is no residual stenosis.  There is about a 20-30% stenosis in the portion of the vessel between the first and second stented segments.  FINAL IMPRESSION: 1. Atherosclerotic cerebrovascular disease, single-vessel. 2. Status post successful PTCA and drug-eluding stent implantation, second    circumflex marginal branch. 3. Intact left ventricular size and systolic function. 4. Mild mitral regurgitation. 5. Aggressive restenosis despite brachytherapy. 6. No angina was reproduced with device insertion or balloon inflation. Dictated by:   Francisca December, M.D. Attending Physician:  Corliss Marcus DD:  01/28/02 TD:  02/01/02 Job: [id number]

## 2011-01-11 NOTE — Op Note (Signed)
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   OPERATION/PROCEDURE:  Right foot triple arthrodesis.   SURGEON:  Nadara Mustard, M.D.   ANESTHESIA:  General plus popliteal block.   ESTIMATED BLOOD LOSS:  Minimal.   ANTIBIOTICS:  1 g of Kefzol.   DRAINS:  None.   INDICATIONS:  None.   TOURNIQUET TIME:  Esmarch to the ankle for approximately 60 minutes.   DISPOSITION:  PACU in stable condition.   INDICATIONS FOR PROCEDURE:  The patient is a 77 year old gentleman with  chronic posterior tibial tendon insufficiency.  The patient has a fixed  pronator valgus hindfoot.  Has pain with activities of daily living.  Complains of crepitation in the subtalar joint and presents at this time for  surgical intervention.  Risks and benefits were discussed including  infection, neurovascular injury, persistent pain, need for additional  surgery.  The patient states he understands and wishes to proceed at this  time.   DESCRIPTION OF PROCEDURE:  The patient underwent a popliteal block and then  was brought to OR #15.  The patient then underwent general anesthetic. After  adequate level of anesthesia was obtained, the patient's right lower  extremity was prepped using DuraPrep, draped into a sterile field.  Collier Flowers  was used to cover all exposed skin.  The leg was elevated and an Esmarch  tourniquet was wrapped around the ankle.  An oblique incision was made over  the sinus tarsi.  This  was sharply carried down to the extensor muscles  which were elevated and retracted.  The calcaneocuboid joint was identified  and debrided of articular cartilage with both the osteotome and curet.  The  posterior facet was debrided of articular cartilage.  Lamina spreader was  used for visualization.  The subtalar joint was further debrided of  fibrinous tissue and debrided down to the subchondral bone.  A sterile  saline was used for irrigation of the wound.  A separate incision was made  over the talonavicular joint just medial to the anterior tibial tendon.  This was carried sharply down to the talonavicular joint and retractors were  placed and the cartilage was removed from the talonavicular joint.  The foot  was placed in 90 degrees of ankle dorsiflexion with the foot in 10 degrees  of valgus and neutral pronation and supination with the foot plantar grade  and a guidewire was inserted from the talus into the calcaneus obliquely  across the posterior facet.  C-arm fluoroscopy was verified the length to be  75 mm.  A drill was  used to overdrill the guidewire and a 7.3 cannulated  screw, 75 mm in length, was inserted to stabilize the posterior facet.  A  separate stab incision was made with the foot held in plantar grade  position.  A stab incision was made and drill was placed across the talus  from the navicular to the talus.  A 50 mm screw was chosen and this was used  to secure the talonavicular joint.  A separate stab incision was made over  the cuboid and a 2.5 drill bit was used to drill across the cuboid to the  calcaneus and a 50 mm screw was used to stabilize the calcaneocuboid joint.  C-arm fluoroscopy was used to verify alignment and reduction of all  hardware.  The Esmarch was released after 60 minutes.  The wounds were  irrigated with normal saline. Hemostasis was obtained.  The sinus tarsi  muscle fascia was closed using 2-0 Vicryl.  Skin was closed using modified   vertical mattress suture with 2-0 nylon for both the sinus tarsi incision  and the talonavicular incision.  The wounds were covered with Adaptic,  orthopedic sponges, sterile Webril, and a Coban dressing.  The patient was  extubated and taken to the PACU in stable condition.      Nadara Mustard, MD  Electronically Signed     MVD/MEDQ  D:  12/04/2005  T:  12/04/2005  Job:  [id number]

## 2011-01-11 NOTE — H&P (Signed)
[place]. [facility]  Patient:    Andrew Marshall, Andrew Marshall Visit Number: [id number] MRN: [id number]          Service Type: CAT Location: 6500 [id number] Attending Physician:  Corliss Marcus Dictated by:   Anselm Lis, N.P. Admit Date:  01/28/2002 Discharge Date: 01/29/2002   CC:         Theressa Millard, M.D.   History and Physical  DATE OF BIRTH:  12/31/1930  PRIMARY CARE PHYSICIAN:  Theressa Millard, M.D.  IMPRESSION (As dictated by Francisca December, M.D.):  Coronary atherosclerotic heart disease; known single vessel.  Recent abnormal surveillance stress Cardiolite suspicious for coronary ischemia in the distribution of the left circumflex/obtuse marginal branch in this 77 year old male with a history of posterior myocardial infarction in September of 2001 and subsequent stent to the proximal circumflex.  He had subsequent stent to the mid circumflex in April of 2002 and then brachytherapy for end-stent restenosis in November of 2002.  Cardiac risk factors include male, age, known single vessel coronary artery disease, hypertension, and dyslipidemia.  PLAN (As dictated by Francisca December, M.D.):  The patient is comfortable to undergo and accepted plans for coronary angiography and possible percutaneous intervention if indicated and able.  The risks, potential complications, benefits, and alternatives to the procedure were discussed in detail. Mr. Bhagat indicates that his questions and concerns have been addressed and is agreeable to proceed.  HISTORY OF PRESENT ILLNESS:  Mr. Andrew Marshall is a very pleasant 77 year old gentleman with a history of posterior MI in September of 2001 with subsequent stent of proximal CFX.  He has had a subsequent stent to his mid CFX in April of 2002 and then later that year brachytherapy for end-stent restenosis.  He has been asymptomatic without complaint of chest discomfort nor symptoms of congestive heart failure.  A recent surveillance  Cardiolite did reveal changes suspicious for ischemia in the distribution of the left circumflex/OM branch. He now presents for coronary angiography and possible percutaneous intervention.  PAST MEDICAL HISTORY: 1. Coronary atherosclerotic heart disease; single vessel.    a. (Dec 30, 2001):  Stress Cardiolite with evidence of coronary ischemia in       the distribution of the left circumflex/OM branch.  EF 55%.  Normal GXT.    b. (November of 2002):  PTCA/brachytherapy/cutting balloon OM.    c. (April of 2002):  PTCA/cutting balloon/stent mid CFX.    d. (October of 2001):  Acute posterior MI with subsequent PTCA/stent of       proximal CFX. 2. History of hypertension for 17 years. 3. Remote history of supraventricular tachycardia for which he is on Lanoxin. 4. Removal of skin cancer on his head; removal of squamous papilloma of    throat. 5. History of ______ syndrome, currently quiescent. 6. Hyperplastic polyp. 7. Allergic rhinitis. 8. Dyslipidemia.  PAST SURGICAL HISTORY: 1. Removal of right wrist ganglion cyst. 2. Appendectomy. 3. Dental surgery. 4. Removal of squamous papilloma of throat.  ALLERGIES:  No known drug allergies.  No known problems with seafood, shellfish, or iodine products.  MEDICATIONS: 1. Atenolol 25 mg p.o. q.d. 2. Lipitor 20 mg p.o. q.d. 3. Aspirin 325 mg p.o. q.d. 4. Chlor-Trimeton t.i.d. to q.i.d. 5. Vitamin C 500 mg q.d. 6. Multivitamins. 7. Lisinopril. 8. Sublingual nitrate p.r.n.  None taken. 9. Tagamet q.d. to b.i.d.  SOCIAL HISTORY AND HABITS:  Tobacco:  Negative.  Quit in September of 2001 after heart attack.  ETOH:  Wine, three glasses or so  a day.  The patient has been married for 14 years.  He has one son, alive and well.  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  FAMILY HISTORY:  His father died at age 19 of old age.  His mother died at age 56 and had angina.  One brother with Crohns disease.  REVIEW OF SYSTEMS:  As in HPI/past  medical history.  Otherwise episodic cough thought related to GERD.  GI/GU negative.  Arthritis negative.  Negative DOE, pedal edema, orthopnea, and PND.  Otherwise the review of systems was fully discussed and was benign.  PHYSICAL EXAMINATION (As performed by Francisca December, M.D.):  Blood pressure 115/48, heart rate 43 and regular, respiratory rate 18, temperature 97.2 degrees.  HEIGHT:  5 feet 10 inches.  WEIGHT:  180 pounds.  GENERAL APPEARANCE:  He is a well-nourished, pleasantly conversant, 77 year old gentleman in no apparent distress.  His wife is in attendance.  NECK:  Brisk bilateral carotid upstroke without bruit.  No significant JVD. No thyromegaly.  CHEST:  Lung sounds clear with equal bilateral excursion.  Negative CVA tenderness.  CARDIAC:  Regular rate and rhythm with a soft S4 and soft systolic murmur.  ABDOMEN:  Nondistended.  Normoactive bowel sounds.  Negative aortic, renal, and femoral bruits.  Nontender to applied pressure.  No masses.  No organomegaly appreciated.  EXTREMITIES:  Distal pulses intact.  Negative pedal edema.  He does have a right femoral bruit.  No induration.  NEUROLOGIC:  Cranial nerves II-XII grossly intact.  Alert and oriented x 3.  GENITALIA:  Exam deferred.  RECTAL:  Exam deferred.  LABORATORY TESTS AND DATA:  From Jan 22, 2002, sodium 139, K 4.8, chloride 101, CO2 27, BUN 22, creatinine 1.1, glucose 84, and calcium 9.5. Hemoglobin 15.7, hematocrit 45.8, WBC 7, platelets 212.  Pro time 10.9, INR 0.87, PTT 31.  The EKG from October 27, 2001, revealed sinus bradycardia at 57 beats per minute with occasional PVC. Dictated by:   Anselm Lis, N.P. Attending Physician:  Corliss Marcus DD:  01/28/02 TD:  01/30/02 Job: [id number]

## 2011-01-11 NOTE — Cardiovascular Report (Signed)
[place]. [facility]  Patient:    Andrew Marshall, Andrew Marshall                          MRN: [id number] Proc. Date: 05/22/00 Adm. Date:  [id number] Attending:  Corliss Marcus CC:         Winn Jock. Earl Gala, M.D.  Cardiac Catheterization Laboratory   Cardiac Catheterization  PROCEDURES PERFORMED: 1. Left heart catheterization. 2. Coronary angiography. 3. Left ventriculogram. 4. Percutaneous coronary intervention/stent implantation proximal    left circumflex artery.  INDICATIONS:  Mr. Andrew Marshall is a 77 year old man who presented to [facility] Emergency Room approximately 2 hours into what appears to be an acute posterior wall myocardial infarction.  He has not responded completely to IV heparin and nitroglycerin, although pain has decreased somewhat over the past 1 hours.  He is transported to the cardiac catheterization laboratory for possible acute intervention.  ECG reveals ST segment depression in V1, V2, V3, and V4.  He has been hemodynamically stable.  DESCRIPTION OF PROCEDURE:  Left heart catheterization was performed following the percutaneous insertion of a 7 French catheter sheath utilizing an anterior approach over a guiding J wire into the right femoral artery.  A 6 French Judkins right diagnostic catheter was advanced to the ascending aorta where the pressure was recorded.  The right coronary os was then engaged and cineangiography performed in LAO and RAO projections.  This catheter was removed and exchanged for a 6 French #4 left Judkins catheter. Cineangiography of the left coronary artery was conducted in the LAO and RAO projections.  This catheter was removed and a 7 [place] Sci-Med wiseguide guiding catheter, Voda #4 left was advanced to the ascending aorta where the left coronary os was engaged.  A 0.014 Sci-Med luge intracoronary guidewire was passed across a complete occlusion of the proximal left circumflex without difficulty.  A 3.0/20 mm Sci-Med  Maverick intracoronary balloon was passed across the lesion without difficulty.  There was antegrade flow established after wire passage.  This was approximately 8:15, giving a 3-3/4 hour time to reperfusion based on the patients described onset of chest pain.  The Maverick balloon was inflated twice to a total of 6 atmospheres for 1 minute and 7 seconds.  It was removed and replaced by a 3.5/12 NIR intracoronary stent.  This was placed across the residual lesion in the proximal left circumflex and ultimately deployed at 14 atmospheres for approximately 1 minute.  A second balloon dilatation was performed, again to 14 atmospheres for 38 seconds.  Reperfusion resulted in an extensive arrhythmia, none of which was sustained. The patient remained hemodynamically stable.  He received several intracoronary doses of nitroglycerin.  Following conformation of adequate patency in orthogonal views both with and without the guidewire in place the guiding catheter was removed.  At this point a 110 cm pigtail catheter was advanced to the ascending aorta and prolapsed across the aortic valve into the left ventricle.  Pressure was recorded.  A 30 degree RAO and then a 60 degree LAO with 8 degrees of cranial angulation and left ventriculograms were performed, each injecting 35 cc at 13 cc/sec.  The pigtail catheter was then removed over a guidewire and hemostasis was achieved by suturing the sheath in place.  The patient was transported to the recovery area in stable condition with intact distal pulses.  The patient received 5000 units of heparin in the emergency room with initial ACT in the catheterization laboratory was  262 falling to 252 at the time of completion.  ReoPro bolus and constant infusion was administered during the procedure as well.  HEMODYNAMICS:  Systemic arterial pressure was 141/75 with a mean of 103 mmHg. There was no systolic gradient across the aortic valve.  The left  ventricular end-diastolic pressure was 28 mmHg pre and post ventriculogram.  ANGIOGRAPHY:  LEFT VENTRICULOGRAM:  The left ventriculogram demonstrated normal global systolic function.  There was mid inferior focal akinesis.  There was no mitral regurgitation.  The visual estimate of ejection fraction is 65-70%.  No coronary calcification is seen.  LEFT VENTRICULOGRAM:  The left ventriculogram in the LAO projection revealed focal posterior wall akinesis as well.  There was a right dominant coronary system present.  The main left coronary artery was short and normal.  The left anterior descending artery was without significant obstruction. There were luminal irregularities and a single large diagonal branch arose.  The left circumflex artery was completely occluded proximally.  The right coronary artery was tortuous and had a 30% proximal stenosis and a 30-40% mid vessel stenosis.  There was a very focal 30% stenosis in the distal portion.  The vessel then gave rise to a small posterolateral segment and two small posterolateral branches.  There was a large posterior descending artery present without significant obstruction.  Collateral vessels were seen filling the distal left circumflex branches posteriorly.  Following balloon dilatation and stent implantation in the proximal left anterior descending artery there was a 10% residual stenosis.  Antegrade flow revealed a large branching true obtuse marginal branch and a superior branch off this proximal circumflex without significant obstruction.  FINAL IMPRESSION: 1. Atherosclerotic coronary vascular disease, single-vessel. 2. Acute posterior wall myocardial infarction secondary to circumflex    artery occlusion. 3. Status post successful percutaneous transluminal coronary angioplasty and    stent implantation, left circumflex coronary artery. 4. Intact left ventricular size and global systolic function with regional     wall  motion abnormalities as noted. 5. Elevated left ventricular end-diastolic pressure. DD:  05/22/00 TD:  05/22/00 Job: [id number]

## 2011-07-29 ENCOUNTER — Ambulatory Visit: Payer: Self-pay | Admitting: Internal Medicine

## 2011-08-05 ENCOUNTER — Telehealth: Payer: Self-pay | Admitting: Internal Medicine

## 2011-08-05 MED ORDER — AZITHROMYCIN 250 MG PO TABS: ORAL_TABLET | ORAL | Status: DC

## 2011-08-05 NOTE — Telephone Encounter (Signed)
Spoke with pt and advised per Dr Maple Hudson, we will call in zpack. Rx was sent to pharm and pt verbalized understanding.

## 2011-08-05 NOTE — Telephone Encounter (Signed)
Per CY ok to have a zpak

## 2011-08-05 NOTE — Telephone Encounter (Signed)
I spoke with pt and he c/o dry cough but can occasionally get up cream color phlem, sore throat, nasal congestion, drainage down back of throat, x 2 days. Pt has been taking an OTC tussin cough syrup and it has helped some. Pt denies any fever, body aches, nausea, vomiting. Pt is requesting further recs from Dr. Maple Hudson. Please advise, thanks   Allergies no known allergies

## 2011-08-15 ENCOUNTER — Encounter: Payer: Self-pay | Admitting: Internal Medicine

## 2011-08-16 ENCOUNTER — Ambulatory Visit (INDEPENDENT_AMBULATORY_CARE_PROVIDER_SITE_OTHER): Payer: Medicare Other | Admitting: Internal Medicine

## 2011-08-16 ENCOUNTER — Encounter: Payer: Self-pay | Admitting: Internal Medicine

## 2011-08-16 VITALS — BP 110/64 | HR 59 | Ht 69.5 in | Wt 191.4 lb

## 2011-08-16 DIAGNOSIS — R059 Cough, unspecified: Secondary | ICD-10-CM

## 2011-08-16 DIAGNOSIS — R05 Cough: Secondary | ICD-10-CM

## 2011-08-16 DIAGNOSIS — J069 Acute upper respiratory infection, unspecified: Secondary | ICD-10-CM

## 2011-08-16 DIAGNOSIS — J309 Allergic rhinitis, unspecified: Secondary | ICD-10-CM

## 2011-08-16 DIAGNOSIS — K219 Gastro-esophageal reflux disease without esophagitis: Secondary | ICD-10-CM

## 2011-08-16 MED ORDER — CEFDINIR 300 MG PO CAPS: 300.0000 mg | ORAL_CAPSULE | Freq: Two times a day (BID) | ORAL | Status: AC

## 2011-08-16 MED ORDER — METHYLPREDNISOLONE ACETATE 80 MG/ML IJ SUSP
80.0000 mg | Freq: Once | INTRAMUSCULAR | Status: AC
  Administered 2011-08-16: 80 mg via INTRAMUSCULAR

## 2011-08-16 MED ORDER — PHENYLEPHRINE HCL 1 % NA SOLN: 3.0000 [drp] | Freq: Once | NASAL | Status: AC

## 2011-08-16 NOTE — Patient Instructions (Signed)
Script antibiotic sent  Neb neo nasal  Depo 80  Increase fluid intake

## 2011-08-16 NOTE — Progress Notes (Signed)
08/16/11- 80 yoM former smoker followed for history of cough, allergic rhinitis, complicated by GERD, HBP LOV-07/30/10 We had called in Zpak 12/10 for a bad cold with bronchitis and cough. He has had flu shot. She'll head congestion and blowing of white mucus but denies headache. Some maxillary sinus pressure and upper tooth pain. Chest feels much better. During colonoscopy this year he aspirated. He has coughed more since then. CT scan of the chest was done with no concerning findings by his description, but the report is not in our system.  ROS-see HPI Constitutional:   No-   weight loss, night sweats, fevers, chills, fatigue, lassitude. HEENT:   No-  headaches, difficulty swallowing, tooth/dental problems, sore throat,       No-  sneezing, itching, ear ache,    + nasal congestion, post nasal drip,  CV:  No-   chest pain, orthopnea, PND, swelling in lower extremities, anasarca,  dizziness, palpitations Resp: No-   shortness of breath with exertion or at rest.              +  productive cough,  + non-productive cough,  No- coughing up of blood.              No-   change in color of mucus.  No- wheezing.   Skin: No-   rash or lesions. GI:  No-   heartburn, indigestion, abdominal pain, nausea, vomiting, diarrhea,                 change in bowel habits, loss of appetite GU: MS:  No-   joint pain or swelling.  No- decreased range of motion.  No- back pain. Neuro-     nothing unusual Psych:  No- change in mood or affect. No depression or anxiety.  No memory loss.    OBJ General- Alert, Oriented, Affect-appropriate, Distress- none acute Skin- rash-none, lesions- none, excoriation- none Lymphadenopathy- none Head- atraumatic            Eyes- Gross vision intact, PERRLA, conjunctivae clear secretions            Ears- Hearing, canals-normal            Nose- nasal stuffiness, sniffing, no-Septal dev, mucus, polyps, erosion, perforation             Throat- Mallampati IV , mucosa clear , drainage-  none, tonsils- atrophic Neck- flexible , trachea midline, no stridor , thyroid nl, carotid no bruit Chest - symmetrical excursion , unlabored           Heart/CV- RRR , no murmur , no gallop  , no rub, nl s1 s2                           - JVD- none , edema- none, stasis changes- none, varices- none           Lung- clear to P&A, wheeze- none, cough- none , dullness-none, rub- none           Chest wall-  Abd- tender-no, distended-no, bowel sounds-present, HSM- no Br/ Gen/ Rectal- Not done, not indicated Extrem- cyanosis- none, clubbing, none, atrophy- none, strength- nl Neuro- grossly intact to observation '

## 2011-08-22 NOTE — Assessment & Plan Note (Signed)
Reflux event associated with conscious sedation for colonoscopy. This remains a risk for him, especially while asleep at night. We have reviewed reflux precautions again.

## 2011-08-22 NOTE — Assessment & Plan Note (Signed)
Recent, responded to Z-Pak and supportive care.

## 2011-08-22 NOTE — Assessment & Plan Note (Signed)
Rhinitis now is residual from virus pattern infection rather than allergy.

## 2011-09-26 ENCOUNTER — Encounter: Payer: Self-pay | Admitting: Internal Medicine

## 2011-09-26 ENCOUNTER — Ambulatory Visit (INDEPENDENT_AMBULATORY_CARE_PROVIDER_SITE_OTHER): Payer: Medicare Other | Admitting: Internal Medicine

## 2011-09-26 VITALS — BP 128/78 | HR 69 | Ht 69.5 in | Wt 194.6 lb

## 2011-09-26 DIAGNOSIS — J069 Acute upper respiratory infection, unspecified: Secondary | ICD-10-CM | POA: Diagnosis not present

## 2011-09-26 MED ORDER — AMOXICILLIN-POT CLAVULANATE 875-125 MG PO TABS: 1.0000 | ORAL_TABLET | Freq: Two times a day (BID) | ORAL | Status: AC

## 2011-09-26 NOTE — Patient Instructions (Signed)
Script for augmentin sent to drug store.  Please call as needed

## 2011-09-26 NOTE — Progress Notes (Signed)
08/16/11- 80 yoM former smoker followed for history of cough, allergic rhinitis, complicated by GERD, HBP LOV-07/30/10 We had called in Zpak 12/10 for a bad cold with bronchitis and cough. He has had flu shot. Still head congestion and blowing of white mucus but denies headache. Some maxillary sinus pressure and upper tooth pain. Chest feels much better. During colonoscopy this year he aspirated. He has coughed more since then. CT scan of the chest was done with no concerning findings by his description, but the report is not in our system.  09/26/11- 80 yoM former smoker followed for history of cough, allergic rhinitis, complicated by GERD, HBP In color from a cold with bronchitis in December. Residual cough and intermittent laryngitis since then. He still tries to ride his bicycle outdoors even in the very clear whether. Much nasal discharge with no sinus pressure or purulent mucus. He thinks he may get short of breath climbing stairs a little more easily. No significant wheeze or cough. No chest pain. He has a history of "skipped beats"  ROS-see HPI Constitutional:   No-   weight loss, night sweats, fevers, chills, fatigue, lassitude. HEENT:   No-  headaches, difficulty swallowing, tooth/dental problems, sore throat,       No-  sneezing, itching, ear ache,    + nasal congestion, post nasal drip,  CV:  No-   chest pain, orthopnea, PND, swelling in lower extremities, anasarca,  dizziness, palpitations Resp: +  shortness of breath with exertion or at rest.              No- productive cough,  + non-productive cough,  No- coughing up of blood.              No-   change in color of mucus.  No- wheezing.   Skin: No-   rash or lesions. GI:  No-   heartburn, indigestion, abdominal pain, nausea, vomiting, diarrhea,                 change in bowel habits, loss of appetite GU: MS:  No-   joint pain or swelling.  No- decreased range of motion.  No- back pain. Neuro-     nothing unusual Psych:  No- change  in mood or affect. No depression or anxiety.  No memory loss.  OBJ General- Alert, Oriented, Affect-appropriate, Distress- none acute Skin- rash-none, lesions- none, excoriation- none Lymphadenopathy- none Head- atraumatic            Eyes- Gross vision intact, PERRLA, conjunctivae clear secretions            Ears- Hearing, canals-normal            Nose- nasal stuffiness, sniffing, no-Septal dev, mucus, polyps, erosion, perforation             Throat- Mallampati IV , mucosa clear , drainage- none, tonsils- atrophic. Throat clearing Neck- flexible , trachea midline, no stridor , thyroid nl, carotid no bruit Chest - symmetrical excursion , unlabored           Heart/CV- Regular w/ frequent extrasystoles , no murmur , no gallop  , no rub, nl s1 s2                           - JVD- none , edema- none, stasis changes- none, varices- none           Lung- clear to P&A, wheeze- none, cough- none , dullness-none, rub- none  Chest wall-  Abd-  Br/ Gen/ Rectal- Not done, not indicated Extrem- cyanosis- none, clubbing, none, atrophy- none, strength- nl Neuro- grossly intact to observation '

## 2011-09-27 ENCOUNTER — Ambulatory Visit: Payer: Medicare Other | Admitting: Internal Medicine

## 2011-09-28 ENCOUNTER — Encounter: Payer: Self-pay | Admitting: Internal Medicine

## 2011-09-28 NOTE — Assessment & Plan Note (Addendum)
Recurrent this winter, aggravated by his bike riding in cold air. Think this is also the source of much of his laryngitis. Consider possible low grade chronic sinusitis. Plan-Augmentin

## 2011-10-14 ENCOUNTER — Telehealth: Payer: Self-pay | Admitting: Internal Medicine

## 2011-10-14 MED ORDER — TRAMADOL HCL 50 MG PO TABS: ORAL_TABLET | ORAL | Status: DC

## 2011-10-14 NOTE — Telephone Encounter (Signed)
Spoke with pt and notified of recs per CDY. Pt verbalized understanding. Rx was sent to pharm.  

## 2011-10-14 NOTE — Telephone Encounter (Signed)
Would like to try to break the cough cycle with a cough suppressant.  Suggest order Tramadol 50 mg, # 30, 1-2 every 6 hours, as needed for cough

## 2011-10-14 NOTE — Telephone Encounter (Signed)
Pt states he has had 3 rounds of abx and his cough has not improved. It is mostly a dry, hacky type of cough but does have occasional production. He denies any sob, wheezing, f/c/s. He says his allergies are a problem most of the time and does not think this is a contributing factor. Pls advise.No Known Allergies

## 2011-10-28 DIAGNOSIS — L821 Other seborrheic keratosis: Secondary | ICD-10-CM | POA: Diagnosis not present

## 2011-10-28 DIAGNOSIS — L570 Actinic keratosis: Secondary | ICD-10-CM | POA: Diagnosis not present

## 2011-10-28 DIAGNOSIS — D239 Other benign neoplasm of skin, unspecified: Secondary | ICD-10-CM | POA: Diagnosis not present

## 2011-11-08 DIAGNOSIS — Z79899 Other long term (current) drug therapy: Secondary | ICD-10-CM | POA: Diagnosis not present

## 2011-12-09 DIAGNOSIS — I4891 Unspecified atrial fibrillation: Secondary | ICD-10-CM | POA: Diagnosis not present

## 2011-12-09 DIAGNOSIS — I059 Rheumatic mitral valve disease, unspecified: Secondary | ICD-10-CM | POA: Diagnosis not present

## 2011-12-09 DIAGNOSIS — I2510 Atherosclerotic heart disease of native coronary artery without angina pectoris: Secondary | ICD-10-CM | POA: Diagnosis not present

## 2011-12-09 DIAGNOSIS — Z9861 Coronary angioplasty status: Secondary | ICD-10-CM | POA: Diagnosis not present

## 2011-12-09 DIAGNOSIS — E7800 Pure hypercholesterolemia, unspecified: Secondary | ICD-10-CM | POA: Diagnosis not present

## 2011-12-09 DIAGNOSIS — I4510 Unspecified right bundle-branch block: Secondary | ICD-10-CM | POA: Diagnosis not present

## 2011-12-10 DIAGNOSIS — F4320 Adjustment disorder, unspecified: Secondary | ICD-10-CM | POA: Diagnosis not present

## 2011-12-12 DIAGNOSIS — H903 Sensorineural hearing loss, bilateral: Secondary | ICD-10-CM | POA: Diagnosis not present

## 2011-12-13 DIAGNOSIS — S0920XA Traumatic rupture of unspecified ear drum, initial encounter: Secondary | ICD-10-CM | POA: Diagnosis not present

## 2011-12-13 DIAGNOSIS — H7190 Unspecified cholesteatoma, unspecified ear: Secondary | ICD-10-CM | POA: Diagnosis not present

## 2011-12-13 DIAGNOSIS — H9110 Presbycusis, unspecified ear: Secondary | ICD-10-CM | POA: Diagnosis not present

## 2012-02-06 DIAGNOSIS — C61 Malignant neoplasm of prostate: Secondary | ICD-10-CM | POA: Diagnosis not present

## 2012-02-11 DIAGNOSIS — H908 Mixed conductive and sensorineural hearing loss, unspecified: Secondary | ICD-10-CM | POA: Diagnosis not present

## 2012-02-11 DIAGNOSIS — H902 Conductive hearing loss, unspecified: Secondary | ICD-10-CM | POA: Diagnosis not present

## 2012-02-11 DIAGNOSIS — S0920XA Traumatic rupture of unspecified ear drum, initial encounter: Secondary | ICD-10-CM | POA: Diagnosis not present

## 2012-02-13 DIAGNOSIS — Z8546 Personal history of malignant neoplasm of prostate: Secondary | ICD-10-CM | POA: Diagnosis not present

## 2012-02-13 DIAGNOSIS — N529 Male erectile dysfunction, unspecified: Secondary | ICD-10-CM | POA: Diagnosis not present

## 2012-02-17 DIAGNOSIS — R51 Headache: Secondary | ICD-10-CM | POA: Diagnosis not present

## 2012-04-14 DIAGNOSIS — J301 Allergic rhinitis due to pollen: Secondary | ICD-10-CM | POA: Diagnosis not present

## 2012-04-14 DIAGNOSIS — R490 Dysphonia: Secondary | ICD-10-CM | POA: Diagnosis not present

## 2012-05-13 DIAGNOSIS — I10 Essential (primary) hypertension: Secondary | ICD-10-CM | POA: Diagnosis not present

## 2012-05-13 DIAGNOSIS — R0789 Other chest pain: Secondary | ICD-10-CM | POA: Diagnosis not present

## 2012-05-13 DIAGNOSIS — I2510 Atherosclerotic heart disease of native coronary artery without angina pectoris: Secondary | ICD-10-CM | POA: Diagnosis not present

## 2012-05-19 DIAGNOSIS — I10 Essential (primary) hypertension: Secondary | ICD-10-CM | POA: Diagnosis not present

## 2012-05-19 DIAGNOSIS — I2510 Atherosclerotic heart disease of native coronary artery without angina pectoris: Secondary | ICD-10-CM | POA: Diagnosis not present

## 2012-05-19 DIAGNOSIS — R0789 Other chest pain: Secondary | ICD-10-CM | POA: Diagnosis not present

## 2012-05-27 DIAGNOSIS — I2510 Atherosclerotic heart disease of native coronary artery without angina pectoris: Secondary | ICD-10-CM | POA: Diagnosis not present

## 2012-05-27 DIAGNOSIS — I10 Essential (primary) hypertension: Secondary | ICD-10-CM | POA: Diagnosis not present

## 2012-05-27 DIAGNOSIS — E7800 Pure hypercholesterolemia, unspecified: Secondary | ICD-10-CM | POA: Diagnosis not present

## 2012-05-27 DIAGNOSIS — I209 Angina pectoris, unspecified: Secondary | ICD-10-CM | POA: Diagnosis not present

## 2012-05-28 DIAGNOSIS — I2510 Atherosclerotic heart disease of native coronary artery without angina pectoris: Secondary | ICD-10-CM | POA: Diagnosis not present

## 2012-06-19 DIAGNOSIS — R509 Fever, unspecified: Secondary | ICD-10-CM | POA: Diagnosis not present

## 2012-06-19 DIAGNOSIS — R197 Diarrhea, unspecified: Secondary | ICD-10-CM | POA: Diagnosis not present

## 2012-06-19 DIAGNOSIS — J069 Acute upper respiratory infection, unspecified: Secondary | ICD-10-CM | POA: Diagnosis not present

## 2012-07-06 DIAGNOSIS — J209 Acute bronchitis, unspecified: Secondary | ICD-10-CM | POA: Diagnosis not present

## 2012-07-21 DIAGNOSIS — R05 Cough: Secondary | ICD-10-CM | POA: Diagnosis not present

## 2012-07-21 DIAGNOSIS — D696 Thrombocytopenia, unspecified: Secondary | ICD-10-CM | POA: Diagnosis not present

## 2012-07-21 DIAGNOSIS — R059 Cough, unspecified: Secondary | ICD-10-CM | POA: Diagnosis not present

## 2012-08-13 ENCOUNTER — Ambulatory Visit: Payer: Medicare Other | Admitting: Internal Medicine

## 2012-08-14 ENCOUNTER — Ambulatory Visit (INDEPENDENT_AMBULATORY_CARE_PROVIDER_SITE_OTHER)
Admission: RE | Admit: 2012-08-14 | Discharge: 2012-08-14 | Disposition: A | Discharge status: Home / Self Care | Payer: Medicare Other | Source: Ambulatory Visit | Attending: Internal Medicine | Admitting: Internal Medicine

## 2012-08-14 ENCOUNTER — Encounter: Payer: Self-pay | Admitting: Internal Medicine

## 2012-08-14 ENCOUNTER — Ambulatory Visit (INDEPENDENT_AMBULATORY_CARE_PROVIDER_SITE_OTHER): Payer: Medicare Other | Admitting: Internal Medicine

## 2012-08-14 VITALS — BP 116/80 | HR 63 | Ht 69.5 in | Wt 188.2 lb

## 2012-08-14 DIAGNOSIS — J40 Bronchitis, not specified as acute or chronic: Secondary | ICD-10-CM | POA: Diagnosis not present

## 2012-08-14 DIAGNOSIS — J069 Acute upper respiratory infection, unspecified: Secondary | ICD-10-CM

## 2012-08-14 DIAGNOSIS — Z23 Encounter for immunization: Secondary | ICD-10-CM

## 2012-08-14 NOTE — Patient Instructions (Addendum)
Order- CXR  Dx bronchitis  Flu vax

## 2012-08-14 NOTE — Progress Notes (Signed)
08/16/11- 80 yoM former smoker followed for history of cough, allergic rhinitis, complicated by GERD, HBP LOV-07/30/10 We had called in Zpak 12/10 for a bad cold with bronchitis and cough. He has had flu shot. Still head congestion and blowing of white mucus but denies headache. Some maxillary sinus pressure and upper tooth pain. Chest feels much better. During colonoscopy this year he aspirated. He has coughed more since then. CT scan of the chest was done with no concerning findings by his description, but the report is not in our system.  09/26/11- 80 yoM former smoker followed for history of cough, allergic rhinitis, complicated by GERD, HBP BPIn color from a cold with bronchitis in December. Residual cough and intermittent laryngitis since then. He still tries to ride his bicycle outdoors even in the very clear whether. Much nasal discharge with no sinus pressure or purulent mucus. He thinks he may get short of breath climbing stairs a little more easily. No significant wheeze or cough. No chest pain. He has a history of "skipped beats"  08/14/12- 81 yoM former smoker followed for history of cough, allergic rhinitis, complicated by GERD, HBP FOLLOWS FOR: still having bouts of losing voice for no reason; no flare ups at this time Had a bad cold in late October which took a long time to clear. Took a Z-Pak in November for sinusitis.  Some residual head congestion still. Couldn't ride his bike or 2 months and has just restarted. Going vegetarian.  ROS-see HPI Constitutional:   No-   weight loss, night sweats, fevers, chills, fatigue, lassitude. HEENT:   No-  headaches, difficulty swallowing, tooth/dental problems, sore throat,       No-  sneezing, itching, ear ache,    + nasal congestion, post nasal drip,  CV:  No-   chest pain, orthopnea, PND, swelling in lower extremities, anasarca,  dizziness, palpitations Resp: +  shortness of breath with exertion or at rest.              No- productive cough,   + non-productive cough,  No- coughing up of blood.              No-   change in color of mucus.  No- wheezing.   Skin: No-   rash or lesions. GI:  No-   heartburn, indigestion, abdominal pain, nausea, vomiting,  GU: MS:  No-   joint pain or swelling.   Neuro-     nothing unusual Psych:  No- change in mood or affect. No depression or anxiety.  No memory loss.  OBJ General- Alert, Oriented, Affect-appropriate, Distress- none acute. Trim and fit-appearing  Skin- rash-none, lesions- none, excoriation- none Lymphadenopathy- none Head- atraumatic            Eyes- Gross vision intact, PERRLA, conjunctivae clear secretions            Ears- Hearing aids            Nose- nasal stuffiness, sniffing, no-Septal dev, mucus, polyps, erosion, perforation             Throat- Mallampati III-IV , mucosa clear , drainage- none, tonsils- atrophic. Throat clearing Neck- flexible , trachea midline, no stridor , thyroid nl, carotid no bruit Chest - symmetrical excursion , unlabored           Heart/CV- Regular w/ frequent extrasystoles , no murmur , no gallop  , no rub, nl s1 s2                           -  JVD- none , edema- none, stasis changes- none, varices- none           Lung- clear to P&A, wheeze- none, cough- none , dullness-none, rub- none           Chest wall-  Abd-  Br/ Gen/ Rectal- Not done, not indicated Extrem- cyanosis- none, clubbing, none, atrophy- none, strength- nl Neuro- grossly intact to observation '

## 2012-08-26 NOTE — Assessment & Plan Note (Signed)
Resolving upper respiratory infection with bronchitis and sinusitis. He may have had more than one separate event. Plan-chest x-ray (former smoker). Flu vaccine

## 2012-08-27 DIAGNOSIS — H2510 Age-related nuclear cataract, unspecified eye: Secondary | ICD-10-CM | POA: Diagnosis not present

## 2012-08-28 DIAGNOSIS — R490 Dysphonia: Secondary | ICD-10-CM | POA: Diagnosis not present

## 2012-08-28 DIAGNOSIS — S0920XA Traumatic rupture of unspecified ear drum, initial encounter: Secondary | ICD-10-CM | POA: Diagnosis not present

## 2012-08-28 DIAGNOSIS — K219 Gastro-esophageal reflux disease without esophagitis: Secondary | ICD-10-CM | POA: Diagnosis not present

## 2012-09-26 DIAGNOSIS — M25569 Pain in unspecified knee: Secondary | ICD-10-CM | POA: Diagnosis not present

## 2012-10-02 DIAGNOSIS — Z79899 Other long term (current) drug therapy: Secondary | ICD-10-CM | POA: Diagnosis not present

## 2012-10-02 DIAGNOSIS — E7800 Pure hypercholesterolemia, unspecified: Secondary | ICD-10-CM | POA: Diagnosis not present

## 2012-10-07 DIAGNOSIS — I10 Essential (primary) hypertension: Secondary | ICD-10-CM | POA: Diagnosis not present

## 2012-10-07 DIAGNOSIS — E7800 Pure hypercholesterolemia, unspecified: Secondary | ICD-10-CM | POA: Diagnosis not present

## 2012-10-07 DIAGNOSIS — I2510 Atherosclerotic heart disease of native coronary artery without angina pectoris: Secondary | ICD-10-CM | POA: Diagnosis not present

## 2012-11-16 DIAGNOSIS — J040 Acute laryngitis: Secondary | ICD-10-CM | POA: Diagnosis not present

## 2012-12-07 ENCOUNTER — Ambulatory Visit (INDEPENDENT_AMBULATORY_CARE_PROVIDER_SITE_OTHER): Payer: Medicare Other | Admitting: Sports Medicine

## 2012-12-07 VITALS — BP 132/70 | Ht 69.0 in | Wt 178.0 lb

## 2012-12-07 DIAGNOSIS — R269 Unspecified abnormalities of gait and mobility: Secondary | ICD-10-CM

## 2012-12-07 DIAGNOSIS — M7750 Other enthesopathy of unspecified foot: Secondary | ICD-10-CM

## 2012-12-07 DIAGNOSIS — M79609 Pain in unspecified limb: Secondary | ICD-10-CM

## 2012-12-07 NOTE — Progress Notes (Signed)
  [facility]  Patient name: Andrew Marshall MRN [id number]  Date of birth: [birth year]  CC & HPI:  Andrew Marshall is a 79 y.o. male presenting today for new orthotics.    # Foot Pain: Patient had a triple arthrodesis of the right ankle.  He has previously had 2 sets of custom orthotics that he has done well with however these are showing excessive wear.  He reports that with his orthotics he is able to ambulate a a moderate amount if he paces himself, otherwise his pain is too severe.  He is able to continue to exercise on the bicycle but notices that due to his ankle pain he is limited in his ability to cycle out of the saddle.  With orthotics he gets minimal swelling If he walkd barefoot or does not use orthotics he gets a lot of swelling  ROS:  No lower extremity numbness, no tingling, no calf cramping.  No falls.   Pertinent History Reviewed:  Medical & Surgical Hx:  Reviewed & Updated - see associated section in EMR. Significant for CAD, status post MI. Medications: Reviewed & Updated - see associated section in EMR. Prior to Admission medications   Medication Sig Start Date End Date Taking? Authorizing Provider  amLODipine-valsartan (EXFORGE) 5-160 MG per tablet Take 1 tablet by mouth daily.      Historical Provider, MD  aspirin 81 MG tablet Take 81 mg by mouth daily.    Historical Provider, MD  atenolol (TENORMIN) 25 MG tablet Take 1/2 tablet by mouth once a day.     Historical Provider, MD  CHLORPHENIRAMINE MALEATE PO Take 4 mg by mouth. 3-4 tablets daily.     Historical Provider, MD  Coenzyme Q-10 100 MG capsule Take 100 mg by mouth daily.     Historical Provider, MD  Multiple Vitamin (MULTIVITAMIN) tablet Take 1 tablet by mouth daily.      Historical Provider, MD  Resveratrol 250 MG CAPS Take 1 capsule by mouth daily.    Historical Provider, MD  rosuvastatin (CRESTOR) 20 MG tablet Take 20 mg by mouth daily.      Historical Provider, MD  sildenafil (VIAGRA) 100 MG tablet  Take 100 mg by mouth daily as needed.    Historical Provider, MD   Social History: Reviewed & Updated - see associated section in EMR.  Significant for  reports that he has quit smoking. He does not have any smokeless tobacco history on file.  Objective Findings:  Vitals: BP 132/70  Ht 5\' 9"  (1.753 m)  Wt 178 lb (80.74 kg)  BMI 26.27 kg/m2  PE: GENERAL:  Elderly adult, well-built Caucasian male. In no discomfort; no respiratory distress. Bilateral foot exam: Right ankle minimal mobility, Lt andkle shows approximately 20 of dorsiflexion, 30 of plantar flexion.  No appreciable eversion RT, 10 of inversion.  Poor midfoot mobility.  Left halluxus rigidus. Bilateral longitudinal arch collapse.  Transverse arch collapse bilat with mild hammering of lat 3 toes  Muscle Testing: 5+/5 hip flexion, knee flexion, knee extension, plantar and dorsiflexion with minimal range of motion of right ankle as above. Gait Evaluation: markedly pronated without correction    Assessment & Plan:

## 2012-12-07 NOTE — Assessment & Plan Note (Signed)
Patient was fitted for a : standard, cushioned, semi-rigid orthotic. The orthotic was heated and afterward the patient stood on the orthotic blank positioned on the orthotic stand. The patient was positioned in subtalar neutral position and 10 degrees of ankle dorsiflexion in a weight bearing stance. After completion of molding, a stable base was applied to the orthotic blank. The blank was ground to a stable position for weight bearing. Size: 14 red EVA Base: blue EVA Posting: lateral heel bilat/ first ray post  rt Additional orthotic padding: MT pads bilat  Time is 45 mins  Replace these when they show too much wear  Prn reck

## 2012-12-07 NOTE — Assessment & Plan Note (Signed)
MT pads are in wrong location  Removed and pulled back  These felt better to patient

## 2012-12-07 NOTE — Assessment & Plan Note (Signed)
He has had excellent relief of most sxs with use of orthotics  Gets forefoot pain without MT pads  Pain midfoot without orthotics

## 2012-12-09 ENCOUNTER — Encounter: Payer: Medicare Other | Admitting: Sports Medicine

## 2013-01-06 DIAGNOSIS — R51 Headache: Secondary | ICD-10-CM | POA: Diagnosis not present

## 2013-01-25 DIAGNOSIS — D239 Other benign neoplasm of skin, unspecified: Secondary | ICD-10-CM | POA: Diagnosis not present

## 2013-01-25 DIAGNOSIS — L570 Actinic keratosis: Secondary | ICD-10-CM | POA: Diagnosis not present

## 2013-01-25 DIAGNOSIS — Z85828 Personal history of other malignant neoplasm of skin: Secondary | ICD-10-CM | POA: Diagnosis not present

## 2013-01-25 DIAGNOSIS — L821 Other seborrheic keratosis: Secondary | ICD-10-CM | POA: Diagnosis not present

## 2013-02-08 DIAGNOSIS — H1045 Other chronic allergic conjunctivitis: Secondary | ICD-10-CM | POA: Diagnosis not present

## 2013-02-11 DIAGNOSIS — Z8546 Personal history of malignant neoplasm of prostate: Secondary | ICD-10-CM | POA: Diagnosis not present

## 2013-02-18 DIAGNOSIS — N529 Male erectile dysfunction, unspecified: Secondary | ICD-10-CM | POA: Diagnosis not present

## 2013-02-18 DIAGNOSIS — Z8546 Personal history of malignant neoplasm of prostate: Secondary | ICD-10-CM | POA: Diagnosis not present

## 2013-04-08 DIAGNOSIS — I059 Rheumatic mitral valve disease, unspecified: Secondary | ICD-10-CM | POA: Diagnosis not present

## 2013-04-08 DIAGNOSIS — E7800 Pure hypercholesterolemia, unspecified: Secondary | ICD-10-CM | POA: Diagnosis not present

## 2013-04-08 DIAGNOSIS — I10 Essential (primary) hypertension: Secondary | ICD-10-CM | POA: Diagnosis not present

## 2013-04-08 DIAGNOSIS — I2510 Atherosclerotic heart disease of native coronary artery without angina pectoris: Secondary | ICD-10-CM | POA: Diagnosis not present

## 2013-04-08 DIAGNOSIS — I252 Old myocardial infarction: Secondary | ICD-10-CM | POA: Diagnosis not present

## 2013-04-14 DIAGNOSIS — G4721 Circadian rhythm sleep disorder, delayed sleep phase type: Secondary | ICD-10-CM | POA: Diagnosis not present

## 2013-05-12 DIAGNOSIS — G4721 Circadian rhythm sleep disorder, delayed sleep phase type: Secondary | ICD-10-CM | POA: Diagnosis not present

## 2013-05-18 DIAGNOSIS — Z23 Encounter for immunization: Secondary | ICD-10-CM | POA: Diagnosis not present

## 2013-05-29 ENCOUNTER — Other Ambulatory Visit: Payer: Self-pay | Admitting: Cardiology

## 2013-05-31 ENCOUNTER — Inpatient Hospital Stay (HOSPITAL_COMMUNITY): Payer: Medicare Other

## 2013-05-31 ENCOUNTER — Encounter (HOSPITAL_COMMUNITY): Payer: Self-pay | Admitting: Emergency Medicine

## 2013-05-31 ENCOUNTER — Inpatient Hospital Stay (HOSPITAL_COMMUNITY)
Admission: EM | Admit: 2013-05-31 | Discharge: 2013-06-03 | DRG: 379 | Disposition: A | Discharge status: Home / Self Care | Payer: Medicare Other | Attending: Internal Medicine | Admitting: Internal Medicine

## 2013-05-31 DIAGNOSIS — M2140 Flat foot [pes planus] (acquired), unspecified foot: Secondary | ICD-10-CM | POA: Diagnosis present

## 2013-05-31 DIAGNOSIS — K922 Gastrointestinal hemorrhage, unspecified: Secondary | ICD-10-CM

## 2013-05-31 DIAGNOSIS — Z87891 Personal history of nicotine dependence: Secondary | ICD-10-CM

## 2013-05-31 DIAGNOSIS — Z9861 Coronary angioplasty status: Secondary | ICD-10-CM

## 2013-05-31 DIAGNOSIS — Z881 Allergy status to other antibiotic agents status: Secondary | ICD-10-CM | POA: Diagnosis not present

## 2013-05-31 DIAGNOSIS — I2510 Atherosclerotic heart disease of native coronary artery without angina pectoris: Secondary | ICD-10-CM | POA: Diagnosis present

## 2013-05-31 DIAGNOSIS — Z803 Family history of malignant neoplasm of breast: Secondary | ICD-10-CM | POA: Diagnosis not present

## 2013-05-31 DIAGNOSIS — Z79899 Other long term (current) drug therapy: Secondary | ICD-10-CM

## 2013-05-31 DIAGNOSIS — K921 Melena: Secondary | ICD-10-CM | POA: Diagnosis not present

## 2013-05-31 DIAGNOSIS — Z8249 Family history of ischemic heart disease and other diseases of the circulatory system: Secondary | ICD-10-CM | POA: Diagnosis not present

## 2013-05-31 DIAGNOSIS — K219 Gastro-esophageal reflux disease without esophagitis: Secondary | ICD-10-CM | POA: Diagnosis present

## 2013-05-31 DIAGNOSIS — K5731 Diverticulosis of large intestine without perforation or abscess with bleeding: Secondary | ICD-10-CM | POA: Diagnosis present

## 2013-05-31 DIAGNOSIS — Z8546 Personal history of malignant neoplasm of prostate: Secondary | ICD-10-CM | POA: Diagnosis not present

## 2013-05-31 DIAGNOSIS — I10 Essential (primary) hypertension: Secondary | ICD-10-CM | POA: Diagnosis present

## 2013-05-31 DIAGNOSIS — I252 Old myocardial infarction: Secondary | ICD-10-CM | POA: Diagnosis not present

## 2013-05-31 LAB — HEMOGLOBIN AND HEMATOCRIT, BLOOD
HCT: 32.6 % — ABNORMAL LOW (ref 39.0–52.0)
HCT: 28.8 % — ABNORMAL LOW (ref 39.0–52.0)
Hemoglobin: 11.5 g/dL — ABNORMAL LOW (ref 13.0–17.0)
Hemoglobin: 10 g/dL — ABNORMAL LOW (ref 13.0–17.0)

## 2013-05-31 LAB — COMPREHENSIVE METABOLIC PANEL
ALT: 17 U/L (ref 0–53)
AST: 21 U/L (ref 0–37)
Albumin: 3.4 g/dL — ABNORMAL LOW (ref 3.5–5.2)
Alkaline Phosphatase: 50 U/L (ref 39–117)
BUN: 17 mg/dL (ref 6–23)
CO2: 25 mEq/L (ref 19–32)
Calcium: 8.3 mg/dL — ABNORMAL LOW (ref 8.4–10.5)
Chloride: 105 mEq/L (ref 96–112)
Creatinine, Ser: 0.87 mg/dL (ref 0.50–1.35)
GFR calc Af Amer: >90 mL/min (ref >90)
GFR calc non Af Amer: 79 mL/min — ABNORMAL LOW (ref >90)
Glucose, Bld: 123 mg/dL — ABNORMAL HIGH (ref 70–99)
Potassium: 4.1 mEq/L (ref 3.5–5.1)
Sodium: 139 mEq/L (ref 135–145)
Total Bilirubin: 0.8 mg/dL (ref 0.3–1.2)
Total Protein: 6 g/dL (ref 6.0–8.3)

## 2013-05-31 LAB — CBC WITH DIFFERENTIAL/PLATELET
Basophils Absolute: 0 10*3/uL (ref 0.0–0.1)
Basophils Relative: 1 % (ref 0–1)
Eosinophils Absolute: 0.3 10*3/uL (ref 0.0–0.7)
Eosinophils Relative: 3 % (ref 0–5)
HCT: 33.6 % — ABNORMAL LOW (ref 39.0–52.0)
Hemoglobin: 11.8 g/dL — ABNORMAL LOW (ref 13.0–17.0)
Lymphocytes Relative: 18 % (ref 12–46)
Lymphs Abs: 1.4 10*3/uL (ref 0.7–4.0)
MCH: 30.9 pg (ref 26.0–34.0)
MCHC: 35.1 g/dL (ref 30.0–36.0)
MCV: 88 fL (ref 78.0–100.0)
Monocytes Absolute: 0.7 10*3/uL (ref 0.1–1.0)
Monocytes Relative: 9 % (ref 3–12)
Neutro Abs: 5.6 10*3/uL (ref 1.7–7.7)
Neutrophils Relative %: 69 % (ref 43–77)
Platelets: 157 10*3/uL (ref 150–400)
RBC: 3.82 MIL/uL — ABNORMAL LOW (ref 4.22–5.81)
RDW: 14.2 % (ref 11.5–15.5)
WBC: 8 10*3/uL (ref 4.0–10.5)

## 2013-05-31 LAB — PROTIME-INR
INR: 1.12 (ref 0.00–1.49)
Prothrombin Time: 14.2 seconds (ref 11.6–15.2)

## 2013-05-31 LAB — TYPE AND SCREEN
ABO/RH(D): A POS
Antibody Screen: NEGATIVE

## 2013-05-31 LAB — ABO/RH: ABO/RH(D): A POS

## 2013-05-31 MED ORDER — TECHNETIUM TC 99M-LABELED RED BLOOD CELLS IV KIT
25.0000 | PACK | Freq: Once | INTRAVENOUS | Status: AC | PRN
  Administered 2013-05-31: 25 via INTRAVENOUS

## 2013-05-31 MED ORDER — MIDAZOLAM HCL 2 MG/2ML IJ SOLN
INTRAMUSCULAR | Status: AC
  Filled 2013-05-31: qty 2

## 2013-05-31 MED ORDER — FENTANYL CITRATE 0.05 MG/ML IJ SOLN
INTRAMUSCULAR | Status: AC
  Filled 2013-05-31: qty 2

## 2013-05-31 MED ORDER — IOHEXOL 300 MG/ML  SOLN
150.0000 mL | Freq: Once | INTRAMUSCULAR | Status: AC | PRN
  Administered 2013-05-31: 1 mL via INTRAVENOUS

## 2013-05-31 MED ORDER — FENTANYL CITRATE 0.05 MG/ML IJ SOLN
INTRAMUSCULAR | Status: DC | PRN
  Administered 2013-05-31: 25 ug via INTRAVENOUS
  Administered 2013-05-31: 50 ug via INTRAVENOUS
  Administered 2013-05-31 (×3): 25 ug via INTRAVENOUS
  Administered 2013-05-31: 50 ug via INTRAVENOUS

## 2013-05-31 MED ORDER — MIDAZOLAM HCL 2 MG/2ML IJ SOLN
INTRAMUSCULAR | Status: AC
  Filled 2013-05-31: qty 4

## 2013-05-31 MED ORDER — FENTANYL CITRATE 0.05 MG/ML IJ SOLN
INTRAMUSCULAR | Status: AC
  Filled 2013-05-31: qty 4

## 2013-05-31 MED ORDER — ACETAMINOPHEN 325 MG PO TABS: 650.0000 mg | ORAL_TABLET | Freq: Four times a day (QID) | ORAL | Status: DC | PRN

## 2013-05-31 MED ORDER — ACETAMINOPHEN 650 MG RE SUPP: 650.0000 mg | Freq: Four times a day (QID) | RECTAL | Status: DC | PRN

## 2013-05-31 MED ORDER — SODIUM CHLORIDE 0.9 % IV SOLN
INTRAVENOUS | Status: AC
  Administered 2013-05-31: 12:00:00 via INTRAVENOUS

## 2013-05-31 MED ORDER — MIDAZOLAM HCL 2 MG/2ML IJ SOLN
INTRAMUSCULAR | Status: DC | PRN
  Administered 2013-05-31: 0.5 mg via INTRAVENOUS
  Administered 2013-05-31: 1 mg via INTRAVENOUS
  Administered 2013-05-31: 2 mg via INTRAVENOUS
  Administered 2013-05-31: 1 mg via INTRAVENOUS

## 2013-05-31 MED ORDER — SODIUM CHLORIDE 0.9 % IV BOLUS (SEPSIS)
500.0000 mL | Freq: Once | INTRAVENOUS | Status: AC
  Administered 2013-05-31: 500 mL via INTRAVENOUS

## 2013-05-31 NOTE — H&P (Signed)
[facility] History and Physical  ILYA NEELY ZOX:[id number] DOB: [birth year] DOA: 05/31/2013  Referring physician: EDP PCP: Darnelle Bos, MD  Specialists: Evette Cristal  Chief Complaint: rectal bleeding  HPI: Andrew Marshall is a 79 y.o. male who presents to ED with BRBPR, multiple episodes since last night.  No pain, no N/V. No F/C.  H/o diverticulosis.  Last colonoscopy reportedly a few years ago at [facility].  Takes ASA daily.  No NSAIDS.  Dr. Evette Cristal consulted by EDP plans bleeding scan and if positive, IR to embolize.  No previous h/o same.  BP, HR not low.  Hgb 11.8.  Review of Systems: systems reviewed.  As above, otherwise negative  Past Medical History  Diagnosis Date  . Heart attack 2001  . Prostate cancer 2008  . Pain, foot, chronic   . Esophageal reflux   . Cough   . Allergic rhinitis   . Pes planus   . Metatarsalgia   . Diverticula of colon    Past Surgical History  Procedure Laterality Date  . Foot arthrodesis    . Esternal beam xrt and seeds for prostate cancer     Social History:  reports that he has quit smoking. He does not have any smokeless tobacco history on file. He reports that  drinks alcohol. He reports that he does not use illicit drugs. married  Allergies  Allergen Reactions  . Erythromycin Nausea And Vomiting and Other (See Comments)    GI upset and pains   FH:  Mother with breast cancer.  Father with heart disease  Prior to Admission medications   Medication Sig Start Date End Date Taking? Authorizing Provider  amLODipine-valsartan (EXFORGE) 5-160 MG per tablet Take 1 tablet by mouth daily.     Yes Historical Provider, MD  aspirin 81 MG tablet Take 81 mg by mouth daily.   Yes Historical Provider, MD  atenolol (TENORMIN) 25 MG tablet Take 12.5 mg by mouth daily.    Yes Historical Provider, MD  CHLORPHENIRAMINE MALEATE PO Take 4 mg by mouth every 6 (six) hours as needed (for congestion). 3-4 tablets daily.   Yes Historical Provider, MD   Coenzyme Q-10 100 MG capsule Take 100 mg by mouth daily.    Yes Historical Provider, MD  Multiple Vitamin (MULTIVITAMIN) tablet Take 1 tablet by mouth 3 (three) times a week.    Yes Historical Provider, MD  Resveratrol 250 MG CAPS Take 1 capsule by mouth daily.   Yes Historical Provider, MD  rosuvastatin (CRESTOR) 20 MG tablet Take 20 mg by mouth daily.     Yes Historical Provider, MD  sildenafil (VIAGRA) 100 MG tablet Take 100 mg by mouth daily as needed for erectile dysfunction.    Yes Historical Provider, MD   Physical Exam: Filed Vitals:   05/31/13 1000  BP: 123/48  Pulse: 56  Temp:   Resp:    BP 126/50  Pulse 65  Temp(Src) 98.2 F (36.8 C) (Oral)  Resp 18  Ht 5\' 9"  (1.753 m)  Wt 78.926 kg (174 lb)  BMI 25.68 kg/m2  SpO2 98%  General Appearance:    Alert, cooperative, no distress, appears stated age  Head:    Normocephalic, without obvious abnormality, atraumatic  Eyes:    PERRL, conjunctiva/corneas clear, EOM's intact, fundi    benign, both eyes       Ears:    deferred  Nose:   Nares normal, septum midline, mucosa normal, no drainage   or sinus tenderness  Throat:  Lips, mucosa, and tongue normal; teeth and gums normal  Neck:   Supple, symmetrical, trachea midline, no adenopathy;       thyroid:  No enlargement/tenderness/nodules; no carotid   bruit or JVD  Back:     Symmetric, no curvature, ROM normal, no CVA tenderness  Lungs:     Clear to auscultation bilaterally, respirations unlabored  Chest wall:    No tenderness or deformity  Heart:    Regular rate and rhythm, S1 and S2 normal, no murmur, rub   or gallop  Abdomen:     Soft, non-tender, bowel sounds active all four quadrants,    no masses, no organomegaly  Genitalia:    deferred  Rectal:    Per EDP, no hemorrhoids or mass. Bloody stool  Extremities:   Extremities normal, atraumatic, no cyanosis or edema  Pulses:   2+ and symmetric all extremities  Skin:   Skin color, texture, turgor normal, no rashes or  lesions  Lymph nodes:   Cervical, supraclavicular, and axillary nodes normal  Neurologic:   CNII-XII intact. Normal strength, sensation and reflexes      throughout    Psych:  Normal affect  Labs on Admission:  Basic Metabolic Panel:  Recent Labs Lab 05/31/13 0749  NA 139  K 4.1  CL 105  CO2 25  GLUCOSE 123*  BUN 17  CREATININE 0.87  CALCIUM 8.3*   Liver Function Tests:  Recent Labs Lab 05/31/13 0749  AST 21  ALT 17  ALKPHOS 50  BILITOT 0.8  PROT 6.0  ALBUMIN 3.4*   No results found for this basename: LIPASE, AMYLASE,  in the last 168 hours No results found for this basename: AMMONIA,  in the last 168 hours CBC:  Recent Labs Lab 05/31/13 0749  WBC 8.0  NEUTROABS 5.6  HGB 11.8*  HCT 33.6*  MCV 88.0  PLT 157   Cardiac Enzymes: No results found for this basename: CKTOTAL, CKMB, CKMBINDEX, TROPONINI,  in the last 168 hours  BNP (last 3 results) No results found for this basename: PROBNP,  in the last 8760 hours CBG: No results found for this basename: GLUCAP,  in the last 168 hours  Radiological Exams on Admission: No results found.   Assessment/Plan  LGIB, likely diverticular.  Plan per Dr. Evette Cristal.  Stable. Serial H/H. NPO    HYPERTENSION:  Hold meds    CARCINOMA, PROSTATE    CAD (coronary artery disease): stable   Time spent: 60 min  Staisha Winiarski L [facility] Pager [phone]  If 7PM-7AM, please contact night-coverage [email or web address] Password [email or web address] 05/31/2013, 11:55 AM

## 2013-05-31 NOTE — ED Provider Notes (Signed)
CSN: [id number]     Arrival date & time 05/31/13  0631 History   First MD Initiated Contact with Patient 05/31/13 [phone]     Chief Complaint  Patient presents with  . Rectal Bleeding   (Consider location/radiation/quality/duration/timing/severity/associated sxs/prior Treatment) HPI Comments: Patient is an 79 year old male past medical history significant for coronary artery disease and diverticulosis. He presents with complaints of rectal bleeding that started last night. He states he has had several episodes of bright red blood per rectum since last night. He denies any discomfort. He denies any fevers or chills. He denies any chest pain, shortness of breath, or lightheadedness. He has a history of diverticulosis diagnosed by colonoscopy 2 years ago however has never had any bleeding and has never been diagnosed with diverticulitis.  Gastroenterologist is Santa Lighter from [facility].  Patient is a 79 y.o. male presenting with hematochezia. The history is provided by the patient.  Rectal Bleeding Quality:  Bright red Amount:  Moderate Duration:  12 hours Timing:  Constant Progression:  Worsening Chronicity:  New Context: not anal fissures, not diarrhea and not rectal pain   Similar prior episodes: no   Relieved by:  Nothing Worsened by:  Nothing tried Ineffective treatments:  None tried Associated symptoms: no abdominal pain and no fever   Risk factors: no anticoagulant use     Past Medical History  Diagnosis Date  . Heart attack 2001  . Prostate cancer 2008  . Pain, foot, chronic   . Esophageal reflux   . Cough   . Allergic rhinitis   . Pes planus   . Metatarsalgia    Past Surgical History  Procedure Laterality Date  . Foot arthrodesis    . Esternal beam xrt and seeds for prostate cancer     History reviewed. No pertinent family history. History  Substance Use Topics  . Smoking status: Former Games developer  . Smokeless tobacco: Not on file  . Alcohol Use: Yes    Review  of Systems  Constitutional: Negative for fever.  Gastrointestinal: Positive for hematochezia. Negative for abdominal pain.  All other systems reviewed and are negative.    Allergies  Erythromycin  Home Medications   Current Outpatient Rx  Name  Route  Sig  Dispense  Refill  . amLODipine-valsartan (EXFORGE) 5-160 MG per tablet   Oral   Take 1 tablet by mouth daily.           Marland Kitchen aspirin 81 MG tablet   Oral   Take 81 mg by mouth daily.         Marland Kitchen atenolol (TENORMIN) 25 MG tablet   Oral   Take 12.5 mg by mouth daily.          . CHLORPHENIRAMINE MALEATE PO   Oral   Take 4 mg by mouth every 6 (six) hours as needed (for congestion). 3-4 tablets daily.         . Coenzyme Q-10 100 MG capsule   Oral   Take 100 mg by mouth daily.          . Multiple Vitamin (MULTIVITAMIN) tablet   Oral   Take 1 tablet by mouth 3 (three) times a week.          Marland Kitchen Resveratrol 250 MG CAPS   Oral   Take 1 capsule by mouth daily.         . rosuvastatin (CRESTOR) 20 MG tablet   Oral   Take 20 mg by mouth daily.           Marland Kitchen  sildenafil (VIAGRA) 100 MG tablet   Oral   Take 100 mg by mouth daily as needed for erectile dysfunction.           BP 142/69  Pulse 63  Temp(Src) 97.6 F (36.4 C) (Oral)  Resp 14  Ht 5\' 9"  (1.753 m)  Wt 174 lb (78.926 kg)  BMI 25.68 kg/m2  SpO2 99% Physical Exam  Nursing note and vitals reviewed. Constitutional: He is oriented to person, place, and time. He appears well-developed and well-nourished. No distress.  HENT:  Head: Normocephalic and atraumatic.  Mouth/Throat: Oropharynx is clear and moist.  Neck: Normal range of motion. Neck supple.  Cardiovascular: Normal rate, regular rhythm and normal heart sounds.   No murmur heard. Pulmonary/Chest: Effort normal and breath sounds normal. No respiratory distress. He has no wheezes.  Abdominal: Soft. Bowel sounds are normal. He exhibits no distension. There is no tenderness.  Genitourinary: Rectum  normal.  The rectum appears normal. There is no hemorrhoid and no masses. There is grossly bloody stool present that is bright red.  Musculoskeletal: Normal range of motion. He exhibits no edema.  Neurological: He is alert and oriented to person, place, and time.  Skin: Skin is warm and dry. He is not diaphoretic.    ED Course  Procedures (including critical care time) Labs Review Labs Reviewed  CBC WITH DIFFERENTIAL  COMPREHENSIVE METABOLIC PANEL  PROTIME-INR  TYPE AND SCREEN   Imaging Review No results found.  MDM  No diagnosis found. Patient is an 79 year old male presents with lower GI bleeding. He has a history of diverticuli and I suspect that this is the source of the bleeding. His hemoglobin is slightly reduced 11.8 and I suspect this will drop further as time goes on. I discussed the case with Dr. Shawnie Dapper from gastroenterology who agrees the patient should be admitted. I have spoken with Dr. Lendell Caprice from [facility] who agrees to admit the patient to her service for likely colonoscopy.    Geoffery Lyons, MD 05/31/13 [phone]

## 2013-05-31 NOTE — Procedures (Signed)
Mesenteric angio: negative No complication No blood loss. See complete dictation in [facility] PACS.  

## 2013-05-31 NOTE — H&P (Signed)
Referring Physician: Dr. Evette Cristal HPI: Andrew Marshall is an 79 y.o. male with PMHx of CAD s/p multiple coronary stents, prostate cancer, gerd, and history of diverticulosis x 2 years. He states his last colonoscopy was approximately 2 1/2 years ago and he has never had a GI bleed in the past. He admits to bright red blood with BM since 10pm last evening. He admits to being lightheaded when standing up, he denies any shortness of breath or chest pain. He states his abdominal pain is currently rated 1/10. He denies any N/V, fever or chills. The patient is currently on the nuclear medicine table during my examination.   Past Medical History:  Past Medical History  Diagnosis Date  . Heart attack 2001  . Prostate cancer 2008  . Pain, foot, chronic   . Esophageal reflux   . Cough   . Allergic rhinitis   . Pes planus   . Metatarsalgia   . Diverticula of colon     Past Surgical History:  Past Surgical History  Procedure Laterality Date  . Foot arthrodesis    . Esternal beam xrt and seeds for prostate cancer      Family History:  Family History  Problem Relation Age of Onset  . Breast cancer Mother   . Heart disease Father     Social History:  reports that he has quit smoking. He does not have any smokeless tobacco history on file. He reports that  drinks alcohol. He reports that he does not use illicit drugs.  Allergies:  Allergies  Allergen Reactions  . Erythromycin Nausea And Vomiting and Other (See Comments)    GI upset and pains      Medication List    ASK your doctor about these medications       amLODipine-valsartan 5-160 MG per tablet  Commonly known as:  EXFORGE  Take 1 tablet by mouth daily.     aspirin 81 MG tablet  Take 81 mg by mouth daily.     atenolol 25 MG tablet  Commonly known as:  TENORMIN  TAKE 1/2 TAB ORALLY ONCE A DAY     CHLORPHENIRAMINE MALEATE PO  Take 4 mg by mouth every 6 (six) hours as needed (for congestion). 3-4 tablets daily.     Coenzyme  Q-10 100 MG capsule  Take 100 mg by mouth daily.     multivitamin tablet  Take 1 tablet by mouth 3 (three) times a week.     Resveratrol 250 MG Caps  Take 1 capsule by mouth daily.     rosuvastatin 20 MG tablet  Commonly known as:  CRESTOR  Take 20 mg by mouth daily.     sildenafil 100 MG tablet  Commonly known as:  VIAGRA  Take 100 mg by mouth daily as needed for erectile dysfunction.        Please HPI for pertinent positives, otherwise complete 10 system ROS negative.  Physical Exam: BP 126/50  Pulse 65  Temp(Src) 98.2 F (36.8 C) (Oral)  Resp 18  Ht 5\' 9"  (1.753 m)  Wt 174 lb (78.926 kg)  BMI 25.68 kg/m2  SpO2 98% Body mass index is 25.68 kg/(m^2).   General Appearance:  Alert, cooperative, no distress  Head:  Normocephalic, without obvious abnormality, atraumatic  Neck: Supple, symmetrical, trachea midline  Lungs:   Clear to auscultation bilaterally, no w/r/r, respirations unlabored without use of accessory muscles.  Chest Wall:  No tenderness or deformity  Heart:  Regular rate and rhythm, S1,  S2 normal, no murmur, rub or gallop.  Abdomen:   Soft, non-tender, non distended.  Extremities: Extremities normal, atraumatic, no cyanosis or edema  Pulses: 2+ and symmetric  Neurologic: Normal affect, no gross deficits.   Results for orders placed during the hospital encounter of 05/31/13 (from the past 48 hour(s))  CBC WITH DIFFERENTIAL     Status: Abnormal   Collection Time    05/31/13  7:49 AM      Result Value Range   WBC 8.0  4.0 - 10.5 K/uL   RBC 3.82 (*) 4.22 - 5.81 MIL/uL   Hemoglobin 11.8 (*) 13.0 - 17.0 g/dL   HCT 16.1 (*) 09.6 - 04.5 %   MCV 88.0  78.0 - 100.0 fL   MCH 30.9  26.0 - 34.0 pg   MCHC 35.1  30.0 - 36.0 g/dL   RDW 40.9  81.1 - 91.4 %   Platelets 157  150 - 400 K/uL   Neutrophils Relative % 69  43 - 77 %   Neutro Abs 5.6  1.7 - 7.7 K/uL   Lymphocytes Relative 18  12 - 46 %   Lymphs Abs 1.4  0.7 - 4.0 K/uL   Monocytes Relative 9  3 - 12 %    Monocytes Absolute 0.7  0.1 - 1.0 K/uL   Eosinophils Relative 3  0 - 5 %   Eosinophils Absolute 0.3  0.0 - 0.7 K/uL   Basophils Relative 1  0 - 1 %   Basophils Absolute 0.0  0.0 - 0.1 K/uL  COMPREHENSIVE METABOLIC PANEL     Status: Abnormal   Collection Time    05/31/13  7:49 AM      Result Value Range   Sodium 139  135 - 145 mEq/L   Potassium 4.1  3.5 - 5.1 mEq/L   Chloride 105  96 - 112 mEq/L   CO2 25  19 - 32 mEq/L   Glucose, Bld 123 (*) 70 - 99 mg/dL   BUN 17  6 - 23 mg/dL   Creatinine, Ser 7.82  0.50 - 1.35 mg/dL   Calcium 8.3 (*) 8.4 - 10.5 mg/dL   Total Protein 6.0  6.0 - 8.3 g/dL   Albumin 3.4 (*) 3.5 - 5.2 g/dL   AST 21  0 - 37 U/L   ALT 17  0 - 53 U/L   Alkaline Phosphatase 50  39 - 117 U/L   Total Bilirubin 0.8  0.3 - 1.2 mg/dL   GFR calc non Af Amer 79 (*) >90 mL/min   GFR calc Af Amer >90  >90 mL/min   Comment: (NOTE)     The eGFR has been calculated using the CKD EPI equation.     This calculation has not been validated in all clinical situations.     eGFR's persistently <90 mL/min signify possible Chronic Kidney     Disease.  PROTIME-INR     Status: None   Collection Time    05/31/13  7:49 AM      Result Value Range   Prothrombin Time 14.2  11.6 - 15.2 seconds   INR 1.12  0.00 - 1.49  TYPE AND SCREEN     Status: None   Collection Time    05/31/13 10:55 AM      Result Value Range   ABO/RH(D) A POS     Antibody Screen NEG     Sample Expiration 06/03/2013    ABO/RH     Status: None   Collection  Time    05/31/13 10:55 AM      Result Value Range   ABO/RH(D) A POS    HEMOGLOBIN AND HEMATOCRIT, BLOOD     Status: Abnormal   Collection Time    05/31/13 11:55 AM      Result Value Range   Hemoglobin 11.5 (*) 13.0 - 17.0 g/dL   HCT 16.1 (*) 09.6 - 04.5 %   No results found.  Assessment/Plan LGI bleed s/p nuclear medicine scan today. Hgb 11.5/ HCT 32.6 History of CAD s/p coronary stents GERD History of prostate cancer. Request for image guided  mesenteric arteriogram with possible embolization. Labs reviewed, patient has been NPO Risks and Benefits discussed with the patient. All of the patient's questions were answered, patient is agreeable to proceed. Consent signed and in chart.   Pattricia Boss D PA-C 05/31/2013, 4:20 PM

## 2013-05-31 NOTE — Consult Note (Signed)
Subjective:   HPI  The patient is an 79 year old male who started to experience bright red rectal bleeding last night. He has had several episodes of bright red rectal bleeding throughout the night and came to the emergency room where he was subsequently admitted to the hospital. He has had further bright red bleeding here. He has some lower abdominal cramping. He does have a history of extensive diverticulosis of the colon. He has been followed I. Dr. Danise Edge.  Review of Systems Denies chest pain or shortness of breath. Denies any specific upper GI symptoms.  Past Medical History  Diagnosis Date  . Heart attack 2001  . Prostate cancer 2008  . Pain, foot, chronic   . Esophageal reflux   . Cough   . Allergic rhinitis   . Pes planus   . Metatarsalgia   . Diverticula of colon    Past Surgical History  Procedure Laterality Date  . Foot arthrodesis    . Esternal beam xrt and seeds for prostate cancer     History   Social History  . Marital Status: Married    Spouse Name: N/A    Number of Children: N/A  . Years of Education: N/A   Occupational History  . Not on file.   Social History Main Topics  . Smoking status: Former Games developer  . Smokeless tobacco: Not on file  . Alcohol Use: Yes  . Drug Use: No  . Sexual Activity: Not on file   Other Topics Concern  . Not on file   Social History Narrative  . No narrative on file   family history is not on file. Current facility-administered medications:0.9 %  sodium chloride infusion, , Intravenous, STAT, Geoffery Lyons, MD, Last Rate: 75 mL/hr at 05/31/13 1133 Allergies  Allergen Reactions  . Erythromycin Nausea And Vomiting and Other (See Comments)    GI upset and pains     Objective:     BP 123/48  Pulse 56  Temp(Src) 97.6 F (36.4 C) (Oral)  Resp 14  Ht 5\' 9"  (1.753 m)  Wt 78.926 kg (174 lb)  BMI 25.68 kg/m2  SpO2 97%  Alert and oriented  In no acute distress  Heart regular rhythm no murmurs  Lungs  clear  Abdomen: Bowel sounds normal, soft, nontender  Laboratory No components found with this basename: d1      Assessment:     Lower gastrointestinal bleeding characterized by bright red bleeding. In a patient with extensive diverticulosis this is most likely a diverticular bleed. He appears hemodynamically stable.      Plan:     He is admitted to the hospital under the hospitalist service. We will follow his H&H. We will watch for further bleeding. Since he has continued to bleed throughout the night however I think it would be prudent to do a nuclear medicine GI bleeding scan today, and if it is positive then proceed with angiogram to embolize the area of bleeding. Lab Results  Component Value Date   HGB 11.8* 05/31/2013   HCT 33.6* 05/31/2013   ALKPHOS 50 05/31/2013   AST 21 05/31/2013   ALT 17 05/31/2013

## 2013-05-31 NOTE — ED Notes (Signed)
Pt states that he has had "irratic bowel movements for years".

## 2013-05-31 NOTE — ED Notes (Signed)
Pt states that he started having rectal bleeding last PM. Pt states that he is stooling bright red blood.

## 2013-05-31 NOTE — ED Notes (Signed)
Pt states that he started to have blood in stool last night at 10 pm  And it dripped into commode and then he had diarrhea and bleeding seemed to get worse does have hx of diverticuli but has never had flare up

## 2013-06-01 DIAGNOSIS — K922 Gastrointestinal hemorrhage, unspecified: Secondary | ICD-10-CM | POA: Diagnosis not present

## 2013-06-01 LAB — HEMOGLOBIN AND HEMATOCRIT, BLOOD
HCT: 29.4 % — ABNORMAL LOW (ref 39.0–52.0)
Hemoglobin: 10.2 g/dL — ABNORMAL LOW (ref 13.0–17.0)

## 2013-06-01 NOTE — Progress Notes (Signed)
Feels good. GI bleed scan positive but angio negative and no embolization required. Will put on full liquids. If stable can probably go home tomorrow.

## 2013-06-01 NOTE — Progress Notes (Signed)
Subjective: Patient alert and oriented x3 no complaint of abdominal pain no nausea or vomiting. He had some blood colored stool last night. Has not had a bowel movement since. Bleeding scan noted. According to note it appears embolization was not required. Hemoglobin remains 10 he remains hemodynamically stable and has not required transfusion.  Objective: Vital signs in last 24 hours: Temp:  [98.2 F (36.8 C)-99.3 F (37.4 C)] 99.3 F (37.4 C) (10/07 0559) Pulse Rate:  [55-68] 64 (10/07 0559) Resp:  [12-19] 18 (10/07 0559) BP: (119-172)/(43-74) 119/50 mmHg (10/07 0559) SpO2:  [95 %-100 %] 95 % (10/07 0559) Weight change:  Last BM Date: 05/31/13 (bright red stool)  Intake/Output from previous day:   Intake/Output this shift:    General appearance: alert and cooperative Resp: clear to auscultation bilaterally Cardio: regular rate and rhythm, S1, S2 normal, no murmur, click, rub or gallop GI: soft, non-tender; bowel sounds normal; no masses,  no organomegaly  Lab Results:  Results for orders placed during the hospital encounter of 05/31/13 (from the past 24 hour(s))  TYPE AND SCREEN     Status: None   Collection Time    05/31/13 10:55 AM      Result Value Range   ABO/RH(D) A POS     Antibody Screen NEG     Sample Expiration 06/03/2013    ABO/RH     Status: None   Collection Time    05/31/13 10:55 AM      Result Value Range   ABO/RH(D) A POS    HEMOGLOBIN AND HEMATOCRIT, BLOOD     Status: Abnormal   Collection Time    05/31/13 11:55 AM      Result Value Range   Hemoglobin 11.5 (*) 13.0 - 17.0 g/dL   HCT 95.6 (*) 21.3 - 08.6 %  HEMOGLOBIN AND HEMATOCRIT, BLOOD     Status: Abnormal   Collection Time    05/31/13  7:30 PM      Result Value Range   Hemoglobin 10.0 (*) 13.0 - 17.0 g/dL   HCT 57.8 (*) 46.9 - 62.9 %  HEMOGLOBIN AND HEMATOCRIT, BLOOD     Status: Abnormal   Collection Time    06/01/13  4:15 AM      Result Value Range   Hemoglobin 10.2 (*) 13.0 - 17.0 g/dL    HCT 52.8 (*) 41.3 - 52.0 %      Studies/Results: Nm Gi Blood Loss  05/31/2013   CLINICAL DATA:  Active GI bleeding  EXAM: NUCLEAR MEDICINE GASTROINTESTINAL BLEEDING SCAN  TECHNIQUE: Sequential abdominal images were obtained following intravenous administration of Tc-70m labeled red blood cells.  COMPARISON:  None  RADIOPHARMACEUTICALS:  CURIE ULTRATAG TECHNETIUM TC 23M-LABELED RED BLOOD CELLS IV KITmCi Tc-63m in-vitro labeled red cells.  FINDINGS: Following the intravenous administration of the radiopharmaceutical there is abnormal increased radiotracer uptake within the central pelvis. This activity conforms to the lumen of the sigmoid colon. This increases in intensity over time and moves in an antegrade fashion.  IMPRESSION: 1. Examination is positive for active GI bleeding. The source of the bleeding is likely at the level of the sigmoid colon.   Electronically Signed   By: Signa Kell M.D.   On: 05/31/2013 16:45    Medications:  Prior to Admission:  Prescriptions prior to admission  Medication Sig Dispense Refill  . amLODipine-valsartan (EXFORGE) 5-160 MG per tablet Take 1 tablet by mouth daily.        Marland Kitchen aspirin 81 MG tablet Take  81 mg by mouth daily.      . CHLORPHENIRAMINE MALEATE PO Take 4 mg by mouth every 6 (six) hours as needed (for congestion). 3-4 tablets daily.      . Coenzyme Q-10 100 MG capsule Take 100 mg by mouth daily.       . Multiple Vitamin (MULTIVITAMIN) tablet Take 1 tablet by mouth 3 (three) times a week.       Marland Kitchen Resveratrol 250 MG CAPS Take 1 capsule by mouth daily.      . rosuvastatin (CRESTOR) 20 MG tablet Take 20 mg by mouth daily.        . sildenafil (VIAGRA) 100 MG tablet Take 100 mg by mouth daily as needed for erectile dysfunction.        Scheduled:  Continuous:   Assessment/Plan: Diverticular bleed, hemoglobin stable at 10 patient remains hemodynamically stable. He had some dark blood last p.m. Which could have been residual. His diet has  been advanced, disposition pending tolerance of diet and no recurrent bleeding. Await further GI input   LOS: 1 day   Valentine Kuechle D 06/01/2013, 8:30 AM

## 2013-06-02 DIAGNOSIS — I2510 Atherosclerotic heart disease of native coronary artery without angina pectoris: Secondary | ICD-10-CM | POA: Diagnosis not present

## 2013-06-02 DIAGNOSIS — K922 Gastrointestinal hemorrhage, unspecified: Secondary | ICD-10-CM | POA: Diagnosis not present

## 2013-06-02 DIAGNOSIS — I10 Essential (primary) hypertension: Secondary | ICD-10-CM | POA: Diagnosis not present

## 2013-06-02 LAB — CBC
HCT: 28.4 % — ABNORMAL LOW (ref 39.0–52.0)
Hemoglobin: 9.9 g/dL — ABNORMAL LOW (ref 13.0–17.0)
MCH: 30.7 pg (ref 26.0–34.0)
MCHC: 34.9 g/dL (ref 30.0–36.0)
MCV: 88.2 fL (ref 78.0–100.0)
Platelets: 179 10*3/uL (ref 150–400)
RBC: 3.22 MIL/uL — ABNORMAL LOW (ref 4.22–5.81)
RDW: 14.4 % (ref 11.5–15.5)
WBC: 7.1 10*3/uL (ref 4.0–10.5)

## 2013-06-02 NOTE — Discharge Summary (Addendum)
Physician Discharge Summary  NAME:Andrew Marshall  ZOX:[id number]  DOB: 07/20/1931   Admit date: 05/31/2013 Discharge date: 06/03/2013  Admitting Diagnosis: Lower GI bleed  Discharge Diagnoses:  Active Hospital Problems   Diagnosis Date Noted  . Lower GI bleed - probably due to diverticular bleeding.  05/31/2013  . CAD (coronary artery disease) 05/31/2013  . HYPERTENSION 11/13/2007  . CARCINOMA, PROSTATE 11/13/2007    Resolved Hospital Problems   Diagnosis Date Noted Date Resolved  No resolved problems to display.    Discharge Condition: improved  Things to follow up in the outpatient setting: check Hb in one week.   Hospital Course: Patient was admitted with evidence of lower GI bleed. Due to the fact that he had a colonoscopy in the not too distant past, he underwent a NM bleeding scan which was positive. He then underwent visceral angiography by IR. No bleeding site was seen and so no embolization was done. He went 24 hours without bleeding and was about to be discharged when bleeding recurred. His Hb was rechecked and had not dropped significantly. He was observed for another 24 hours, had no further bleeding, and was discharged.   Consults: IR, GI  Disposition:   Discharge Orders   Future Appointments Provider Department Dept Phone   08/17/2013 10:30 AM Waymon Budge, MD [facility] [phone]   10/06/2013 9:00 AM [facility] [place] Office [phone]   10/07/2013 2:00 PM Donato Schultz, MD [facility] [phone]   Future Orders Complete By Expires   Diet - low sodium heart healthy  As directed    Diet - low sodium heart healthy  As directed    Comments:     For the next week, eat a low residue (low fiber) diet and then resume regular diet.   Discharge instructions  As directed    Comments:     You may resume your aspirin in one month.   Increase activity slowly  As directed    Increase activity slowly  As directed         Medication List    STOP taking these medications       aspirin 81 MG tablet      TAKE these medications       amLODipine-valsartan 5-160 MG per tablet  Commonly known as:  EXFORGE  Take 1 tablet by mouth daily.     atenolol 25 MG tablet  Commonly known as:  TENORMIN  TAKE 1/2 TAB ORALLY ONCE A DAY     CHLORPHENIRAMINE MALEATE PO  Take 4 mg by mouth every 6 (six) hours as needed (for congestion). 3-4 tablets daily.     Coenzyme Q-10 100 MG capsule  Take 100 mg by mouth daily.     multivitamin tablet  Take 1 tablet by mouth 3 (three) times a week.     Resveratrol 250 MG Caps  Take 1 capsule by mouth daily.     rosuvastatin 20 MG tablet  Commonly known as:  CRESTOR  Take 20 mg by mouth daily.     sildenafil 100 MG tablet  Commonly known as:  VIAGRA  Take 100 mg by mouth daily as needed for erectile dysfunction.        Time coordinating discharge: 25 minutes including medication reconciliation,  preparation of discharge papers, and discussion with patient     Signed: Darnelle Bos 06/02/2013, 6:41 AM

## 2013-06-03 DIAGNOSIS — I2510 Atherosclerotic heart disease of native coronary artery without angina pectoris: Secondary | ICD-10-CM | POA: Diagnosis not present

## 2013-06-03 DIAGNOSIS — I10 Essential (primary) hypertension: Secondary | ICD-10-CM | POA: Diagnosis not present

## 2013-06-03 DIAGNOSIS — K922 Gastrointestinal hemorrhage, unspecified: Secondary | ICD-10-CM | POA: Diagnosis not present

## 2013-06-03 LAB — CBC
HCT: 28 % — ABNORMAL LOW (ref 39.0–52.0)
Hemoglobin: 9.7 g/dL — ABNORMAL LOW (ref 13.0–17.0)
MCH: 30.4 pg (ref 26.0–34.0)
MCHC: 34.6 g/dL (ref 30.0–36.0)
MCV: 87.8 fL (ref 78.0–100.0)
Platelets: 171 10*3/uL (ref 150–400)
RBC: 3.19 MIL/uL — ABNORMAL LOW (ref 4.22–5.81)
RDW: 14.1 % (ref 11.5–15.5)
WBC: 8.4 10*3/uL (ref 4.0–10.5)

## 2013-06-03 NOTE — Progress Notes (Signed)
Seems to have stopped bleeding again. Hb did not drop significantly. Will d/c today if no further significant bleeding. See d/C summary from yesterday. It has been updated.

## 2013-06-10 DIAGNOSIS — K922 Gastrointestinal hemorrhage, unspecified: Secondary | ICD-10-CM | POA: Diagnosis not present

## 2013-06-22 DIAGNOSIS — G479 Sleep disorder, unspecified: Secondary | ICD-10-CM | POA: Diagnosis not present

## 2013-06-22 DIAGNOSIS — K5731 Diverticulosis of large intestine without perforation or abscess with bleeding: Secondary | ICD-10-CM | POA: Diagnosis not present

## 2013-06-22 DIAGNOSIS — R109 Unspecified abdominal pain: Secondary | ICD-10-CM | POA: Diagnosis not present

## 2013-07-06 ENCOUNTER — Encounter: Payer: Self-pay | Admitting: Cardiology

## 2013-07-30 DIAGNOSIS — M545 Low back pain, unspecified: Secondary | ICD-10-CM | POA: Diagnosis not present

## 2013-08-06 DIAGNOSIS — M5137 Other intervertebral disc degeneration, lumbosacral region: Secondary | ICD-10-CM | POA: Diagnosis not present

## 2013-08-06 DIAGNOSIS — M545 Low back pain, unspecified: Secondary | ICD-10-CM | POA: Diagnosis not present

## 2013-08-09 DIAGNOSIS — G479 Sleep disorder, unspecified: Secondary | ICD-10-CM | POA: Diagnosis not present

## 2013-08-10 DIAGNOSIS — M545 Low back pain, unspecified: Secondary | ICD-10-CM | POA: Diagnosis not present

## 2013-08-10 DIAGNOSIS — M5137 Other intervertebral disc degeneration, lumbosacral region: Secondary | ICD-10-CM | POA: Diagnosis not present

## 2013-08-13 DIAGNOSIS — M545 Low back pain, unspecified: Secondary | ICD-10-CM | POA: Diagnosis not present

## 2013-08-13 DIAGNOSIS — M5137 Other intervertebral disc degeneration, lumbosacral region: Secondary | ICD-10-CM | POA: Diagnosis not present

## 2013-08-13 DIAGNOSIS — M19049 Primary osteoarthritis, unspecified hand: Secondary | ICD-10-CM | POA: Diagnosis not present

## 2013-08-17 ENCOUNTER — Ambulatory Visit: Payer: Medicare Other | Admitting: Internal Medicine

## 2013-08-17 DIAGNOSIS — M5137 Other intervertebral disc degeneration, lumbosacral region: Secondary | ICD-10-CM | POA: Diagnosis not present

## 2013-08-17 DIAGNOSIS — M545 Low back pain, unspecified: Secondary | ICD-10-CM | POA: Diagnosis not present

## 2013-08-23 ENCOUNTER — Other Ambulatory Visit: Payer: Self-pay | Admitting: Cardiology

## 2013-08-25 DIAGNOSIS — M5137 Other intervertebral disc degeneration, lumbosacral region: Secondary | ICD-10-CM | POA: Diagnosis not present

## 2013-08-25 DIAGNOSIS — M545 Low back pain, unspecified: Secondary | ICD-10-CM | POA: Diagnosis not present

## 2013-09-06 ENCOUNTER — Ambulatory Visit: Payer: Medicare Other | Admitting: Internal Medicine

## 2013-09-15 DIAGNOSIS — H903 Sensorineural hearing loss, bilateral: Secondary | ICD-10-CM | POA: Diagnosis not present

## 2013-09-15 DIAGNOSIS — H6120 Impacted cerumen, unspecified ear: Secondary | ICD-10-CM | POA: Diagnosis not present

## 2013-09-22 ENCOUNTER — Other Ambulatory Visit: Payer: Self-pay | Admitting: Cardiology

## 2013-09-26 ENCOUNTER — Other Ambulatory Visit: Payer: Self-pay | Admitting: *Deleted

## 2013-09-26 DIAGNOSIS — E7800 Pure hypercholesterolemia, unspecified: Secondary | ICD-10-CM

## 2013-09-26 DIAGNOSIS — Z79899 Other long term (current) drug therapy: Secondary | ICD-10-CM

## 2013-09-27 DIAGNOSIS — L259 Unspecified contact dermatitis, unspecified cause: Secondary | ICD-10-CM | POA: Diagnosis not present

## 2013-09-27 DIAGNOSIS — Z85828 Personal history of other malignant neoplasm of skin: Secondary | ICD-10-CM | POA: Diagnosis not present

## 2013-10-01 ENCOUNTER — Encounter: Payer: Self-pay | Admitting: Internal Medicine

## 2013-10-01 ENCOUNTER — Ambulatory Visit (INDEPENDENT_AMBULATORY_CARE_PROVIDER_SITE_OTHER): Payer: Medicare Other | Admitting: Internal Medicine

## 2013-10-01 VITALS — BP 130/76 | HR 52 | Ht 70.0 in | Wt 189.8 lb

## 2013-10-01 DIAGNOSIS — J309 Allergic rhinitis, unspecified: Secondary | ICD-10-CM

## 2013-10-01 NOTE — Patient Instructions (Signed)
Consider trying Allegra/ fexofenadine 180 (24 hr) as an antihistamine  As an  Alternative, try sample Dymista nasal spray   1-2 puffs each nostril once daily at bedtime  Please call as needed

## 2013-10-01 NOTE — Progress Notes (Signed)
08/16/11- 80 yoM former smoker followed for history of cough, allergic rhinitis, complicated by GERD, HBP LOV-07/30/10 We had called in [place] 12/10 for a bad cold with bronchitis and cough. He has had flu shot. Still head congestion and blowing of white mucus but denies headache. Some maxillary sinus pressure and upper tooth pain. Chest feels much better. During colonoscopy this year he aspirated. He has coughed more since then. CT scan of the chest was done with no concerning findings by his description, but the report is not in our system.  09/26/11- 80 yoM former smoker followed for history of cough, allergic rhinitis, complicated by GERD, HBP BPIn color from a cold with bronchitis in December. Residual cough and intermittent laryngitis since then. He still tries to ride his bicycle outdoors even in the very clear whether. Much nasal discharge with no sinus pressure or purulent mucus. He thinks he may get short of breath climbing stairs a little more easily. No significant wheeze or cough. No chest pain. He has a history of "skipped beats"  08/14/12- 81 yoM former smoker followed for history of cough, allergic rhinitis, complicated by GERD, HBP FOLLOWS FOR: still having bouts of losing voice for no reason; no flare ups at this time Had a bad cold in late October which took a long time to clear. Took a Z-Pak in November for sinusitis.  Some residual head congestion still. Couldn't ride his bike or 2 months and has just restarted. Going vegetarian.  10/01/13- 82 yoM former smoker followed for history of cough, allergic rhinitis, complicated by GERD, HBP FOLLOWS FOR: has been on OTC antihistamines for years and needs to discuss other recs. Has been taking Chlor-Trimeton for 30 years for drainage and postnasal drip related cough. No longer works as well. Claritin doesn't last long enough. Zyrtec only last 6 hours. He avoids anticholinergics, concerned about his memory. Has not been riding his bike and says  this is why he his a little more short of breath with exertion. CXR 08/14/12 IMPRESSION:  Stable exam. No superimposed acute process  Original Report Authenticated By: Jerilynn Mages. Shick, M.D.  ROS-see HPI Constitutional:   No-   weight loss, night sweats, fevers, chills, fatigue, lassitude. HEENT:   No-  headaches, difficulty swallowing, tooth/dental problems, sore throat,       No-  sneezing, itching, ear ache,    + nasal congestion, +post nasal drip,  CV:  No-   chest pain, orthopnea, PND, swelling in lower extremities, anasarca,  dizziness, palpitations Resp: +  shortness of breath with exertion or at rest.              No- productive cough,  + non-productive cough,  No- coughing up of blood.              No-   change in color of mucus.  No- wheezing.   Skin: No-   rash or lesions. GI:  No-   heartburn, indigestion, abdominal pain, nausea, vomiting,  GU: MS:  No-   joint pain or swelling.   Neuro-     nothing unusual Psych:  No- change in mood or affect. No depression or anxiety.  No memory loss.  OBJ General- Alert, Oriented, Affect-appropriate, Distress- none acute.  Skin- rash-none, lesions- none, excoriation- none Lymphadenopathy- none Head- atraumatic            Eyes- Gross vision intact, PERRLA, conjunctivae clear secretions            Ears- Hearing aids  Nose- nasal stuffiness, sniffing, no-Septal dev, mucus, polyps, erosion, perforation             Throat- Mallampati III-IV , mucosa clear , drainage- none, tonsils- atrophic. Throat clearing Neck- flexible , trachea midline, no stridor , thyroid nl, carotid no bruit Chest - symmetrical excursion , unlabored           Heart/CV- Regular w/ frequent extrasystoles , no murmur , no gallop  , no rub, nl s1 s2                           - JVD- none , edema- none, stasis changes- none, varices- none           Lung- clear to P&A, wheeze- none, cough- none , dullness-none, rub- none           Chest wall-  Abd-  Br/ Gen/ Rectal-  Not done, not indicated Extrem- cyanosis- none, clubbing, none, atrophy- none, strength- nl Neuro- grossly intact to observation '

## 2013-10-06 ENCOUNTER — Other Ambulatory Visit (INDEPENDENT_AMBULATORY_CARE_PROVIDER_SITE_OTHER): Payer: Medicare Other

## 2013-10-06 DIAGNOSIS — E7800 Pure hypercholesterolemia, unspecified: Secondary | ICD-10-CM | POA: Diagnosis not present

## 2013-10-06 DIAGNOSIS — Z79899 Other long term (current) drug therapy: Secondary | ICD-10-CM

## 2013-10-06 LAB — HEPATIC FUNCTION PANEL
ALT: 20 U/L (ref 0–53)
AST: 20 U/L (ref 0–37)
Albumin: 4.1 g/dL (ref 3.5–5.2)
Alkaline Phosphatase: 55 U/L (ref 39–117)
Bilirubin, Direct: 0.2 mg/dL (ref 0.0–0.3)
Total Bilirubin: 1.5 mg/dL — ABNORMAL HIGH (ref 0.3–1.2)
Total Protein: 6.9 g/dL (ref 6.0–8.3)

## 2013-10-06 LAB — LIPID PANEL
Cholesterol: 144 mg/dL (ref 0–200)
HDL: 43.6 mg/dL (ref >39.00)
LDL Cholesterol: 73 mg/dL (ref 0–99)
Total CHOL/HDL Ratio: 3
Triglycerides: 137 mg/dL (ref 0.0–149.0)
VLDL: 27.4 mg/dL (ref 0.0–40.0)

## 2013-10-07 ENCOUNTER — Ambulatory Visit: Payer: Medicare Other | Admitting: Cardiology

## 2013-10-11 DIAGNOSIS — H2510 Age-related nuclear cataract, unspecified eye: Secondary | ICD-10-CM | POA: Diagnosis not present

## 2013-10-12 ENCOUNTER — Ambulatory Visit: Payer: Medicare Other | Admitting: Cardiology

## 2013-10-24 NOTE — Assessment & Plan Note (Signed)
Plan-sample Dymista nasal spray, try Allegra

## 2013-11-09 ENCOUNTER — Ambulatory Visit: Payer: Medicare Other | Admitting: Cardiology

## 2013-11-23 ENCOUNTER — Encounter: Payer: Self-pay | Admitting: Cardiology

## 2013-11-23 ENCOUNTER — Ambulatory Visit (INDEPENDENT_AMBULATORY_CARE_PROVIDER_SITE_OTHER): Payer: Medicare Other | Admitting: Cardiology

## 2013-11-23 VITALS — BP 160/80 | HR 56 | Ht 69.0 in | Wt 188.0 lb

## 2013-11-23 DIAGNOSIS — E7800 Pure hypercholesterolemia, unspecified: Secondary | ICD-10-CM

## 2013-11-23 DIAGNOSIS — G4700 Insomnia, unspecified: Secondary | ICD-10-CM | POA: Diagnosis not present

## 2013-11-23 DIAGNOSIS — I2510 Atherosclerotic heart disease of native coronary artery without angina pectoris: Secondary | ICD-10-CM | POA: Diagnosis not present

## 2013-11-23 DIAGNOSIS — I10 Essential (primary) hypertension: Secondary | ICD-10-CM

## 2013-11-23 DIAGNOSIS — R9431 Abnormal electrocardiogram [ECG] [EKG]: Secondary | ICD-10-CM | POA: Diagnosis not present

## 2013-11-23 MED ORDER — ATORVASTATIN CALCIUM 40 MG PO TABS: 40.0000 mg | ORAL_TABLET | Freq: Every day | ORAL | Status: DC

## 2013-11-23 NOTE — Progress Notes (Signed)
[facility] [address], [place]  [address] Phone: [phone] Fax:  [phone]  Date:  11/23/2013   ID:  Andrew Marshall, DOB 06/24/1931, MRN [id number]  PCP:  Horton Finer, MD   History of Present Illness: Andrew Marshall is a 80 y.o. male with a hx of posterior wall myocardial infarction 9/01 with multiple revascularizations of the proximal left circumflex/OM, DES in 2003 of OM, mild to moderate (maybe worse based on last cath 2008) mitral regurgitation (10/13), and remote hx of atrial fibrillation.   At the time of his heart attack he felt a chest fullness but no overt chest pain. He deneis any syncope, presyncope, nor SOB/PND, no swelling. He has been doing quite well. Cycling. Working for [employer], Production assistant, radio.  NUC 10/13- posterior lateral infarct with mild perinfarct ischemia. Normal EF. Fullness in chest sporatic for a few weeks. Not necessarily exertional.   His main complaint is lack of sleep. Fatigue associated with this. His cycles.    Wt Readings from Last 3 Encounters:  11/23/13 188 lb (85.276 kg)  10/01/13 189 lb 12.8 oz (86.093 kg)  05/31/13 174 lb (78.926 kg)     Past Medical History  Diagnosis Date  . Heart attack 2001  . Prostate cancer 2008  . Pain, foot, chronic   . Esophageal reflux   . Cough   . Allergic rhinitis   . Pes planus   . Metatarsalgia   . Diverticula of colon     Past Surgical History  Procedure Laterality Date  . Foot arthrodesis    . Esternal beam xrt and seeds for prostate cancer      Current Outpatient Prescriptions  Medication Sig Dispense Refill  . amLODipine-valsartan (EXFORGE) 5-160 MG per tablet Take 1 tablet by mouth daily.  30 tablet  4  . atenolol (TENORMIN) 25 MG tablet TAKE 1/2 TAB ORALLY ONCE A DAY  45 tablet  3  . CHLORPHENIRAMINE MALEATE PO Take 4 mg by mouth every 6 (six) hours as needed (for congestion). 3-4 tablets daily.      . Coenzyme Q-10 100 MG capsule Take 100 mg by mouth  daily.       . famotidine (PEPCID) 20 MG tablet Take 20 mg by mouth as needed for heartburn or indigestion.      . Multiple Vitamin (MULTIVITAMIN) tablet Take 1 tablet by mouth 3 (three) times a week.       Marland Kitchen Resveratrol 250 MG CAPS Take 1 capsule by mouth daily.      . rosuvastatin (CRESTOR) 20 MG tablet Take 20 mg by mouth daily.        . sildenafil (VIAGRA) 100 MG tablet Take 100 mg by mouth daily as needed for erectile dysfunction.        No current facility-administered medications for this visit.    Allergies:    Allergies  Allergen Reactions  . Erythromycin Nausea And Vomiting and Other (See Comments)    GI upset and pains    Social History:  The patient  reports that he has quit smoking. He does not have any smokeless tobacco history on file. He reports that he drinks alcohol. He reports that he does not use illicit drugs.   ROS:  Please see the history of present illness.   Denies any fevers, chills, orthopnea, PND, bleeding    PHYSICAL EXAM: VS:  BP 160/80  Pulse 56  Ht 5\' 9"  (1.753 m)  Wt 188 lb (  85.276 kg)  BMI 27.75 kg/m2 Well nourished, well developed, in no acute distress HEENT: normal Neck: no JVD Cardiac:  normal S1, S2; RRR; no murmur Lungs:  clear to auscultation bilaterally, no wheezing, rhonchi or rales Abd: soft, nontender, no hepatomegaly Ext: no edema Skin: warm and dry Neuro: no focal abnormalities noted  EKG:  Sinus bradycardia rate 56 with left axis deviation, right bundle branch block     ASSESSMENT AND PLAN:  1. Old myocardial infarction-posterior. 2001. Doing well. 2. Coronary artery disease-as described above. No anginal symptoms. Cycling. Doing well. 3. Hyperlipidemia-LDL 73 on Crestor 20. He would like to change to atorvastatin 40 mg. Showed me a handout on Crestor. 4. Hypertension-usually well controlled. Continue to monitor at home. He has a blood pressure cuff at home. medications reviewed. 5.  Insomnia-Dr. Maxwell Caul.  Signed, Candee Furbish, MD [facility]  11/23/2013 11:57 AM

## 2013-11-23 NOTE — Patient Instructions (Signed)
Your physician has recommended you make the following change in your medication:   1. Stop Crestor 2. Start Atorvastatin 40 mg once daily.  Your physician recommends that you return for lab work in: 6 months, Lab  Your physician wants you to follow-up in: 6 months with Dr. Marlou Porch. You will receive a reminder letter in the mail two months in advance. If you don't receive a letter, please call our office to schedule the follow-up appointment.

## 2013-12-06 ENCOUNTER — Encounter: Payer: Self-pay | Admitting: *Deleted

## 2013-12-30 ENCOUNTER — Other Ambulatory Visit: Payer: Self-pay | Admitting: Cardiology

## 2013-12-30 MED ORDER — ATORVASTATIN CALCIUM 40 MG PO TABS: 40.0000 mg | ORAL_TABLET | Freq: Every day | ORAL | Status: DC

## 2014-01-24 DIAGNOSIS — L723 Sebaceous cyst: Secondary | ICD-10-CM | POA: Diagnosis not present

## 2014-01-24 DIAGNOSIS — Z85828 Personal history of other malignant neoplasm of skin: Secondary | ICD-10-CM | POA: Diagnosis not present

## 2014-01-24 DIAGNOSIS — L408 Other psoriasis: Secondary | ICD-10-CM | POA: Diagnosis not present

## 2014-01-24 DIAGNOSIS — D1801 Hemangioma of skin and subcutaneous tissue: Secondary | ICD-10-CM | POA: Diagnosis not present

## 2014-01-24 DIAGNOSIS — L570 Actinic keratosis: Secondary | ICD-10-CM | POA: Diagnosis not present

## 2014-02-09 DIAGNOSIS — G479 Sleep disorder, unspecified: Secondary | ICD-10-CM | POA: Diagnosis not present

## 2014-02-09 DIAGNOSIS — I10 Essential (primary) hypertension: Secondary | ICD-10-CM | POA: Diagnosis not present

## 2014-02-09 DIAGNOSIS — I252 Old myocardial infarction: Secondary | ICD-10-CM | POA: Diagnosis not present

## 2014-02-15 DIAGNOSIS — Z8546 Personal history of malignant neoplasm of prostate: Secondary | ICD-10-CM | POA: Diagnosis not present

## 2014-02-16 ENCOUNTER — Other Ambulatory Visit: Payer: Self-pay | Admitting: Cardiology

## 2014-02-17 ENCOUNTER — Other Ambulatory Visit: Payer: Self-pay | Admitting: Cardiology

## 2014-02-18 ENCOUNTER — Other Ambulatory Visit: Payer: Self-pay

## 2014-02-18 MED ORDER — AMLODIPINE BESYLATE-VALSARTAN 5-160 MG PO TABS: 1.0000 | ORAL_TABLET | Freq: Every day | ORAL | Status: DC

## 2014-02-22 DIAGNOSIS — N529 Male erectile dysfunction, unspecified: Secondary | ICD-10-CM | POA: Diagnosis not present

## 2014-02-22 DIAGNOSIS — Z8546 Personal history of malignant neoplasm of prostate: Secondary | ICD-10-CM | POA: Diagnosis not present

## 2014-03-04 DIAGNOSIS — M79609 Pain in unspecified limb: Secondary | ICD-10-CM | POA: Diagnosis not present

## 2014-03-04 DIAGNOSIS — M19249 Secondary osteoarthritis, unspecified hand: Secondary | ICD-10-CM | POA: Diagnosis not present

## 2014-03-06 ENCOUNTER — Inpatient Hospital Stay (HOSPITAL_COMMUNITY)
Admission: EM | Admit: 2014-03-06 | Discharge: 2014-03-08 | DRG: 378 | Disposition: A | Discharge status: Home / Self Care | Payer: Medicare Other | Attending: Internal Medicine | Admitting: Internal Medicine

## 2014-03-06 ENCOUNTER — Encounter (HOSPITAL_COMMUNITY): Payer: Self-pay | Admitting: Emergency Medicine

## 2014-03-06 DIAGNOSIS — D62 Acute posthemorrhagic anemia: Secondary | ICD-10-CM | POA: Diagnosis present

## 2014-03-06 DIAGNOSIS — K921 Melena: Secondary | ICD-10-CM | POA: Diagnosis not present

## 2014-03-06 DIAGNOSIS — Z881 Allergy status to other antibiotic agents status: Secondary | ICD-10-CM | POA: Diagnosis not present

## 2014-03-06 DIAGNOSIS — I2510 Atherosclerotic heart disease of native coronary artery without angina pectoris: Secondary | ICD-10-CM | POA: Diagnosis present

## 2014-03-06 DIAGNOSIS — E860 Dehydration: Secondary | ICD-10-CM | POA: Diagnosis not present

## 2014-03-06 DIAGNOSIS — Z803 Family history of malignant neoplasm of breast: Secondary | ICD-10-CM

## 2014-03-06 DIAGNOSIS — E7800 Pure hypercholesterolemia, unspecified: Secondary | ICD-10-CM | POA: Diagnosis present

## 2014-03-06 DIAGNOSIS — I252 Old myocardial infarction: Secondary | ICD-10-CM

## 2014-03-06 DIAGNOSIS — K219 Gastro-esophageal reflux disease without esophagitis: Secondary | ICD-10-CM | POA: Diagnosis present

## 2014-03-06 DIAGNOSIS — R001 Bradycardia, unspecified: Secondary | ICD-10-CM

## 2014-03-06 DIAGNOSIS — I498 Other specified cardiac arrhythmias: Secondary | ICD-10-CM | POA: Diagnosis not present

## 2014-03-06 DIAGNOSIS — Z888 Allergy status to other drugs, medicaments and biological substances status: Secondary | ICD-10-CM

## 2014-03-06 DIAGNOSIS — Z8249 Family history of ischemic heart disease and other diseases of the circulatory system: Secondary | ICD-10-CM | POA: Diagnosis not present

## 2014-03-06 DIAGNOSIS — I10 Essential (primary) hypertension: Secondary | ICD-10-CM

## 2014-03-06 DIAGNOSIS — K5791 Diverticulosis of intestine, part unspecified, without perforation or abscess with bleeding: Secondary | ICD-10-CM

## 2014-03-06 DIAGNOSIS — Z87891 Personal history of nicotine dependence: Secondary | ICD-10-CM

## 2014-03-06 DIAGNOSIS — I951 Orthostatic hypotension: Secondary | ICD-10-CM | POA: Diagnosis present

## 2014-03-06 DIAGNOSIS — K625 Hemorrhage of anus and rectum: Secondary | ICD-10-CM | POA: Diagnosis not present

## 2014-03-06 DIAGNOSIS — G2581 Restless legs syndrome: Secondary | ICD-10-CM | POA: Diagnosis present

## 2014-03-06 DIAGNOSIS — K5731 Diverticulosis of large intestine without perforation or abscess with bleeding: Principal | ICD-10-CM | POA: Diagnosis present

## 2014-03-06 DIAGNOSIS — R197 Diarrhea, unspecified: Secondary | ICD-10-CM | POA: Diagnosis present

## 2014-03-06 DIAGNOSIS — Z79899 Other long term (current) drug therapy: Secondary | ICD-10-CM

## 2014-03-06 DIAGNOSIS — K922 Gastrointestinal hemorrhage, unspecified: Secondary | ICD-10-CM | POA: Diagnosis present

## 2014-03-06 DIAGNOSIS — Z8546 Personal history of malignant neoplasm of prostate: Secondary | ICD-10-CM | POA: Diagnosis not present

## 2014-03-06 DIAGNOSIS — I959 Hypotension, unspecified: Secondary | ICD-10-CM | POA: Diagnosis not present

## 2014-03-06 DIAGNOSIS — D509 Iron deficiency anemia, unspecified: Secondary | ICD-10-CM | POA: Diagnosis not present

## 2014-03-06 NOTE — ED Notes (Signed)
Patient here with complaint of rectal bleeding starting today. Endorses approximately 15 small episodes of bright red bloody diarrhea today. Prior history of Diverticulitis as recently as October. States that he has had some symptoms of orthostatic hypotension at home.

## 2014-03-06 NOTE — ED Provider Notes (Signed)
CSN: [id number]     Arrival date & time 03/06/14  2208 History   First MD Initiated Contact with Patient 03/06/14 2259     Chief Complaint  Patient presents with  . Rectal Bleeding     (Consider location/radiation/quality/duration/timing/severity/associated sxs/prior Treatment) Patient is a 80 y.o. male presenting with hematochezia. The history is provided by the patient.  Rectal Bleeding He has had multiple episodes of rectal bleeding since this afternoon. He states he had a normal bowel movement this morning with a small amount of bright red blood. This afternoon, he started having diarrhea with diarrhea stool mixed with bright red blood. There has been no clots. He estimates of these 15 bowel movements. He has noted some lightheadedness which is worse on standing. He denies chest pain, dyspnea, abdominal pain, nausea, vomiting. He has noted some slight abdominal bloating. He had been admitted to the hospital last October for a GI bleed which was felt to be due to diverticulosis. He takes aspirin 81 mg daily but is not on any other platelet antagonists and is on no anticoagulants.  Past Medical History  Diagnosis Date  . Heart attack 2001  . Prostate cancer 2008  . Pain, foot, chronic   . Esophageal reflux   . Cough   . Allergic rhinitis   . Pes planus   . Metatarsalgia   . Diverticula of colon   . ASCVD (arteriosclerotic cardiovascular disease)     single vessel  . Essential hypertension, benign   . History of atrial fibrillation     remote history of  . RLS (restless legs syndrome)   . ED (erectile dysfunction)   . H/O laryngeal cancer     superficial  . Coronary atherosclerosis of native coronary artery   . Mitral valve disorders   . Pure hypercholesterolemia   . Old myocardial infarction    Past Surgical History  Procedure Laterality Date  . Foot arthrodesis    . Esternal beam xrt and seeds for prostate cancer    . Colonoscopy w/ polypectomy     Family History   Problem Relation Age of Onset  . Breast cancer Mother   . Heart disease Father    History  Substance Use Topics  . Smoking status: Former Research scientist (life sciences)  . Smokeless tobacco: Not on file     Comment: quit 2001  . Alcohol Use: Yes    Review of Systems  Gastrointestinal: Positive for hematochezia.  All other systems reviewed and are negative.     Allergies  Erythromycin and Ambien  Home Medications   Prior to Admission medications   Medication Sig Start Date End Date Taking? Authorizing Provider  amLODipine-valsartan (EXFORGE) 5-160 MG per tablet Take 1 tablet by mouth daily. 02/18/14   Candee Furbish, MD  atenolol (TENORMIN) 25 MG tablet TAKE 1/2 TAB ORALLY ONCE A DAY    Candee Furbish, MD  atorvastatin (LIPITOR) 40 MG tablet Take 1 tablet (40 mg total) by mouth daily. 12/30/13   Candee Furbish, MD  CHLORPHENIRAMINE MALEATE PO Take 4 mg by mouth every 6 (six) hours as needed (for congestion). 3-4 tablets daily.    Historical Provider, MD  Coenzyme Q-10 100 MG capsule Take 100 mg by mouth daily.     Historical Provider, MD  famotidine (PEPCID) 20 MG tablet Take 20 mg by mouth as needed for heartburn or indigestion.    Historical Provider, MD  Multiple Vitamin (MULTIVITAMIN) tablet Take 1 tablet by mouth 3 (three) times a week.  Historical Provider, MD  Resveratrol 250 MG CAPS Take 1 capsule by mouth daily.    Historical Provider, MD  sildenafil (VIAGRA) 100 MG tablet Take 100 mg by mouth daily as needed for erectile dysfunction.     Historical Provider, MD   BP 108/53  Pulse 60  Temp(Src) 97.9 F (36.6 C) (Oral)  Resp 20  Ht 5\' 9"  (1.753 m)  Wt 175 lb (79.379 kg)  BMI 25.83 kg/m2  SpO2 98% Physical Exam  Nursing note and vitals reviewed.  80 year old male, resting comfortably and in no acute distress. Vital signs are normal. Oxygen saturation is 98%, which is normal. Head is normocephalic and atraumatic. PERRLA, EOMI. Oropharynx is clear. Conjunctivae are pale and Neck is nontender  and supple without adenopathy or JVD. Back is nontender and there is no CVA tenderness. Lungs are clear without rales, wheezes, or rhonchi. Chest is nontender. Heart has regular rate and rhythm without murmur. Abdomen is soft, flat, nontender without masses or hepatosplenomegaly and peristalsis is hyperactive. Rectal: Increased sphincter tone present. Small amount of bright red blood present. Extremities have no cyanosis or edema, full range of motion is present. Skin is warm and dry without rash. Neurologic: Mental status is normal, cranial nerves are intact, there are no motor or sensory deficits.  ED Course  Procedures (including critical care time) Labs Review Results for orders placed during the hospital encounter of 03/06/14  COMPREHENSIVE METABOLIC PANEL      Result Value Ref Range   Sodium 139  137 - 147 mEq/L   Potassium 4.8  3.7 - 5.3 mEq/L   Chloride 101  96 - 112 mEq/L   CO2 23  19 - 32 mEq/L   Glucose, Bld 128 (*) 70 - 99 mg/dL   BUN 26 (*) 6 - 23 mg/dL   Creatinine, Ser 1.06  0.50 - 1.35 mg/dL   Calcium 8.7  8.4 - 10.5 mg/dL   Total Protein 6.3  6.0 - 8.3 g/dL   Albumin 3.7  3.5 - 5.2 g/dL   AST 19  0 - 37 U/L   ALT 16  0 - 53 U/L   Alkaline Phosphatase 49  39 - 117 U/L   Total Bilirubin 1.1  0.3 - 1.2 mg/dL   GFR calc non Af Amer 64 (*) >90 mL/min   GFR calc Af Amer 74 (*) >90 mL/min   Anion gap 15  5 - 15  CBC WITH DIFFERENTIAL      Result Value Ref Range   WBC 9.6  4.0 - 10.5 K/uL   RBC 3.96 (*) 4.22 - 5.81 MIL/uL   Hemoglobin 12.6 (*) 13.0 - 17.0 g/dL   HCT 36.3 (*) 39.0 - 52.0 %   MCV 91.7  78.0 - 100.0 fL   MCH 31.8  26.0 - 34.0 pg   MCHC 34.7  30.0 - 36.0 g/dL   RDW 14.0  11.5 - 15.5 %   Platelets 155  150 - 400 K/uL   Neutrophils Relative % 80 (*) 43 - 77 %   Neutro Abs 7.8 (*) 1.7 - 7.7 K/uL   Lymphocytes Relative 11 (*) 12 - 46 %   Lymphs Abs 1.1  0.7 - 4.0 K/uL   Monocytes Relative 7  3 - 12 %   Monocytes Absolute 0.6  0.1 - 1.0 K/uL    Eosinophils Relative 1  0 - 5 %   Eosinophils Absolute 0.1  0.0 - 0.7 K/uL   Basophils Relative 1  0 - 1 %   Basophils Absolute 0.1  0.0 - 0.1 K/uL  TYPE AND SCREEN      Result Value Ref Range   ABO/RH(D) A POS     Antibody Screen NEG     Sample Expiration 03/09/2014     Imaging Review Dg Abd Acute W/chest  03/07/2014   CLINICAL DATA:  Rectal bleeding  EXAM: ACUTE ABDOMEN SERIES (ABDOMEN 2 VIEW & CHEST 1 VIEW)  COMPARISON:  08/14/2012 chest radiograph  FINDINGS: There is no evidence of dilated bowel loops or free intraperitoneal air. No radiopaque calculi or other significant radiographic abnormality is seen. Prostate brachytherapy seeds noted.  Heart size and mediastinal contours are within normal limits. There is no edema, consolidation, effusion, or pneumothorax. Calcified pulmonary nodule noted at the left apex. Calcified right axillary nodule/node.  IMPRESSION: Negative abdominal radiographs.  No acute cardiopulmonary disease.   Electronically Signed   By: Jorje Guild M.D.   On: 03/07/2014 02:33    MDM   Final diagnoses:  Lower gastrointestinal bleeding    Rectal bleeding in patient with history of lower GI bleeding from diverticulosis. Old records are reviewed and he had been hospitalized last October with a lower GI bleed. He had a positive GI bleeding scan but negative interventional radiology study to identify the bleeding location so there was no embolization done. Orthostatic vital signs will be checked and hemoglobin checked and blood is being drawn for type and screen.  Stool Hemoccult has come back positive although a result is not in the computer. Hemoglobin has actually come back higher than last recorded value which was during his hospitalization. He will need to be admitted for observation. Serial hemoglobins will be needed. Case is discussed with Dr. Reece Levy of [facility] who agrees to admit the patient. Dr. Reece Levy requests abdominal x-rays be obtained and these are  ordered.  Orthostatic vital signs are positive with a drop in blood pressure from 135/54-90/52, and rise in pulse from 51-80.  Delora Fuel, MD 48/01/65 [id number]

## 2014-03-07 ENCOUNTER — Inpatient Hospital Stay (HOSPITAL_COMMUNITY): Payer: Medicare Other

## 2014-03-07 ENCOUNTER — Encounter (HOSPITAL_COMMUNITY): Payer: Self-pay | Admitting: Internal Medicine

## 2014-03-07 DIAGNOSIS — K922 Gastrointestinal hemorrhage, unspecified: Secondary | ICD-10-CM | POA: Diagnosis not present

## 2014-03-07 DIAGNOSIS — R197 Diarrhea, unspecified: Secondary | ICD-10-CM | POA: Diagnosis present

## 2014-03-07 DIAGNOSIS — I498 Other specified cardiac arrhythmias: Secondary | ICD-10-CM

## 2014-03-07 DIAGNOSIS — K5731 Diverticulosis of large intestine without perforation or abscess with bleeding: Secondary | ICD-10-CM | POA: Diagnosis not present

## 2014-03-07 DIAGNOSIS — E860 Dehydration: Secondary | ICD-10-CM | POA: Diagnosis not present

## 2014-03-07 DIAGNOSIS — D509 Iron deficiency anemia, unspecified: Secondary | ICD-10-CM | POA: Diagnosis not present

## 2014-03-07 DIAGNOSIS — R001 Bradycardia, unspecified: Secondary | ICD-10-CM | POA: Diagnosis present

## 2014-03-07 DIAGNOSIS — I951 Orthostatic hypotension: Secondary | ICD-10-CM | POA: Diagnosis present

## 2014-03-07 DIAGNOSIS — I959 Hypotension, unspecified: Secondary | ICD-10-CM | POA: Diagnosis not present

## 2014-03-07 DIAGNOSIS — I10 Essential (primary) hypertension: Secondary | ICD-10-CM | POA: Diagnosis not present

## 2014-03-07 DIAGNOSIS — K625 Hemorrhage of anus and rectum: Secondary | ICD-10-CM | POA: Diagnosis not present

## 2014-03-07 LAB — OCCULT BLOOD, POC DEVICE
Fecal Occult Bld: POSITIVE — AB
Fecal Occult Bld: POSITIVE — AB

## 2014-03-07 LAB — CBC WITH DIFFERENTIAL/PLATELET
Basophils Absolute: 0.1 10*3/uL (ref 0.0–0.1)
Basophils Relative: 1 % (ref 0–1)
Eosinophils Absolute: 0.1 10*3/uL (ref 0.0–0.7)
Eosinophils Relative: 1 % (ref 0–5)
HCT: 36.3 % — ABNORMAL LOW (ref 39.0–52.0)
Hemoglobin: 12.6 g/dL — ABNORMAL LOW (ref 13.0–17.0)
Lymphocytes Relative: 11 % — ABNORMAL LOW (ref 12–46)
Lymphs Abs: 1.1 10*3/uL (ref 0.7–4.0)
MCH: 31.8 pg (ref 26.0–34.0)
MCHC: 34.7 g/dL (ref 30.0–36.0)
MCV: 91.7 fL (ref 78.0–100.0)
Monocytes Absolute: 0.6 10*3/uL (ref 0.1–1.0)
Monocytes Relative: 7 % (ref 3–12)
Neutro Abs: 7.8 10*3/uL — ABNORMAL HIGH (ref 1.7–7.7)
Neutrophils Relative %: 80 % — ABNORMAL HIGH (ref 43–77)
Platelets: 155 10*3/uL (ref 150–400)
RBC: 3.96 MIL/uL — ABNORMAL LOW (ref 4.22–5.81)
RDW: 14 % (ref 11.5–15.5)
WBC: 9.6 10*3/uL (ref 4.0–10.5)

## 2014-03-07 LAB — COMPREHENSIVE METABOLIC PANEL
ALT: 16 U/L (ref 0–53)
AST: 19 U/L (ref 0–37)
Albumin: 3.7 g/dL (ref 3.5–5.2)
Alkaline Phosphatase: 49 U/L (ref 39–117)
Anion gap: 15 (ref 5–15)
BUN: 26 mg/dL — ABNORMAL HIGH (ref 6–23)
CO2: 23 mEq/L (ref 19–32)
Calcium: 8.7 mg/dL (ref 8.4–10.5)
Chloride: 101 mEq/L (ref 96–112)
Creatinine, Ser: 1.06 mg/dL (ref 0.50–1.35)
GFR calc Af Amer: 74 mL/min — ABNORMAL LOW (ref >90)
GFR calc non Af Amer: 64 mL/min — ABNORMAL LOW (ref >90)
Glucose, Bld: 128 mg/dL — ABNORMAL HIGH (ref 70–99)
Potassium: 4.8 mEq/L (ref 3.7–5.3)
Sodium: 139 mEq/L (ref 137–147)
Total Bilirubin: 1.1 mg/dL (ref 0.3–1.2)
Total Protein: 6.3 g/dL (ref 6.0–8.3)

## 2014-03-07 LAB — CBC
HCT: 33 % — ABNORMAL LOW (ref 39.0–52.0)
HCT: 28.3 % — ABNORMAL LOW (ref 39.0–52.0)
HCT: 28.5 % — ABNORMAL LOW (ref 39.0–52.0)
Hemoglobin: 11.2 g/dL — ABNORMAL LOW (ref 13.0–17.0)
Hemoglobin: 9.8 g/dL — ABNORMAL LOW (ref 13.0–17.0)
Hemoglobin: 9.8 g/dL — ABNORMAL LOW (ref 13.0–17.0)
MCH: 30.8 pg (ref 26.0–34.0)
MCH: 31.1 pg (ref 26.0–34.0)
MCH: 31.4 pg (ref 26.0–34.0)
MCHC: 33.9 g/dL (ref 30.0–36.0)
MCHC: 34.6 g/dL (ref 30.0–36.0)
MCHC: 34.4 g/dL (ref 30.0–36.0)
MCV: 90.7 fL (ref 78.0–100.0)
MCV: 89.8 fL (ref 78.0–100.0)
MCV: 91.3 fL (ref 78.0–100.0)
Platelets: 146 10*3/uL — ABNORMAL LOW (ref 150–400)
Platelets: 132 10*3/uL — ABNORMAL LOW (ref 150–400)
Platelets: 137 10*3/uL — ABNORMAL LOW (ref 150–400)
RBC: 3.64 MIL/uL — ABNORMAL LOW (ref 4.22–5.81)
RBC: 3.15 MIL/uL — ABNORMAL LOW (ref 4.22–5.81)
RBC: 3.12 MIL/uL — ABNORMAL LOW (ref 4.22–5.81)
RDW: 14 % (ref 11.5–15.5)
RDW: 14.2 % (ref 11.5–15.5)
RDW: 14.3 % (ref 11.5–15.5)
WBC: 9.5 10*3/uL (ref 4.0–10.5)
WBC: 7.1 10*3/uL (ref 4.0–10.5)
WBC: 6.7 10*3/uL (ref 4.0–10.5)

## 2014-03-07 LAB — BASIC METABOLIC PANEL
Anion gap: 15 (ref 5–15)
BUN: 25 mg/dL — ABNORMAL HIGH (ref 6–23)
CO2: 21 mEq/L (ref 19–32)
Calcium: 8.3 mg/dL — ABNORMAL LOW (ref 8.4–10.5)
Chloride: 104 mEq/L (ref 96–112)
Creatinine, Ser: 0.92 mg/dL (ref 0.50–1.35)
GFR calc Af Amer: 90 mL/min — ABNORMAL LOW (ref >90)
GFR calc non Af Amer: 78 mL/min — ABNORMAL LOW (ref >90)
Glucose, Bld: 112 mg/dL — ABNORMAL HIGH (ref 70–99)
Potassium: 4.4 mEq/L (ref 3.7–5.3)
Sodium: 140 mEq/L (ref 137–147)

## 2014-03-07 LAB — TYPE AND SCREEN
ABO/RH(D): A POS
Antibody Screen: NEGATIVE

## 2014-03-07 MED ORDER — ONDANSETRON HCL 4 MG/2ML IJ SOLN: 4.0000 mg | Freq: Four times a day (QID) | INTRAMUSCULAR | Status: DC | PRN

## 2014-03-07 MED ORDER — SODIUM CHLORIDE 0.9 % IV SOLN
INTRAVENOUS | Status: DC
  Administered 2014-03-07: 1000 mL via INTRAVENOUS
  Administered 2014-03-07 – 2014-03-08 (×2): via INTRAVENOUS

## 2014-03-07 MED ORDER — PANTOPRAZOLE SODIUM 40 MG PO TBEC
40.0000 mg | DELAYED_RELEASE_TABLET | Freq: Two times a day (BID) | ORAL | Status: DC
  Administered 2014-03-07 – 2014-03-08 (×3): 40 mg via ORAL
  Filled 2014-03-07 (×3): qty 1

## 2014-03-07 MED ORDER — ACETAMINOPHEN 650 MG RE SUPP: 650.0000 mg | Freq: Four times a day (QID) | RECTAL | Status: DC | PRN

## 2014-03-07 MED ORDER — FAMOTIDINE 20 MG PO TABS
20.0000 mg | ORAL_TABLET | ORAL | Status: DC | PRN
  Filled 2014-03-07: qty 1

## 2014-03-07 MED ORDER — ONDANSETRON HCL 4 MG PO TABS: 4.0000 mg | ORAL_TABLET | Freq: Four times a day (QID) | ORAL | Status: DC | PRN

## 2014-03-07 MED ORDER — ATORVASTATIN CALCIUM 40 MG PO TABS
40.0000 mg | ORAL_TABLET | Freq: Every day | ORAL | Status: DC
  Administered 2014-03-07 – 2014-03-08 (×2): 40 mg via ORAL
  Filled 2014-03-07 (×2): qty 1

## 2014-03-07 MED ORDER — ACETAMINOPHEN 325 MG PO TABS: 650.0000 mg | ORAL_TABLET | Freq: Four times a day (QID) | ORAL | Status: DC | PRN

## 2014-03-07 MED ORDER — SODIUM CHLORIDE 0.9 % IJ SOLN: 3.0000 mL | Freq: Two times a day (BID) | INTRAMUSCULAR | Status: DC

## 2014-03-07 MED ORDER — ADULT MULTIVITAMIN W/MINERALS CH
1.0000 | ORAL_TABLET | ORAL | Status: DC
  Administered 2014-03-07: 1 via ORAL
  Filled 2014-03-07 (×2): qty 1

## 2014-03-07 NOTE — ED Notes (Signed)
Pt st's he has had blood in his stools today.  Pt denies any pain.  St's he did feel slightly dizzy earlier today when he stood up but not now.  Pt alert and oriented x's 3, skin warm and dry, color appropriate.  Wife at bedside.

## 2014-03-07 NOTE — H&P (Signed)
Patient's PCP: Horton Finer, MD  Chief Complaint: GI bleed  History of Present Illness: Andrew Marshall is a 80 y.o. Caucasian male with history of coronary artery disease, hypercholesterolemia, hypertension, and history of GI bleed in October of 2014 presumed to be due to diverticular bleed who presents with the above complaints.  Patient reports that his symptoms started yesterday afternoon/early morning.  Initially he had a bowel movement and noted one drop of blood subsequently since then he has had at least 14-15 bowel movements are watery in nature with blood.  As a result he presented to the emergency department.  Hospitalist service was asked to admit the patient for further care and management.  He does complain about being dizzy and lightheaded.  Denies any recent fevers, chills, nausea, vomiting, chest pain, shortness of breath, abdominal pain, headaches or vision changes.  In the emergency department he was found to be orthostatic and bradycardic.  Review of Systems: All systems reviewed with the patient and positive as per history of present illness, otherwise all other systems are negative.  Past Medical History  Diagnosis Date  . Heart attack 2001  . Prostate cancer 2008  . Pain, foot, chronic   . Esophageal reflux   . Cough   . Allergic rhinitis   . Pes planus   . Metatarsalgia   . Diverticula of colon   . ASCVD (arteriosclerotic cardiovascular disease)     single vessel  . Essential hypertension, benign   . History of atrial fibrillation     remote history of  . RLS (restless legs syndrome)   . ED (erectile dysfunction)   . H/O laryngeal cancer     superficial  . Coronary atherosclerosis of native coronary artery   . Mitral valve disorders   . Pure hypercholesterolemia   . Old myocardial infarction    Past Surgical History  Procedure Laterality Date  . Foot arthrodesis    . Esternal beam xrt and seeds for prostate cancer    . Colonoscopy w/ polypectomy      Family History  Problem Relation Age of Onset  . Breast cancer Mother   . Heart disease Father    History   Social History  . Marital Status: Married    Spouse Name: N/A    Number of Children: N/A  . Years of Education: N/A   Occupational History  . Not on file.   Social History Main Topics  . Smoking status: Former Research scientist (life sciences)  . Smokeless tobacco: Not on file     Comment: quit 2001  . Alcohol Use: Yes  . Drug Use: No  . Sexual Activity: Not on file   Other Topics Concern  . Not on file   Social History Narrative  . No narrative on file   Allergies: Erythromycin and Ambien  Home Meds: Prior to Admission medications   Medication Sig Start Date End Date Taking? Authorizing Provider  amLODipine-valsartan (EXFORGE) 5-160 MG per tablet Take 1 tablet by mouth daily. 02/18/14  Yes Candee Furbish, MD  aspirin EC 81 MG tablet Take 81 mg by mouth every evening.   Yes Historical Provider, MD  atenolol (TENORMIN) 25 MG tablet Take 12.5 mg by mouth daily.   Yes Historical Provider, MD  atorvastatin (LIPITOR) 40 MG tablet Take 1 tablet (40 mg total) by mouth daily. 12/30/13  Yes Candee Furbish, MD  CHLORPHENIRAMINE MALEATE PO Take 4 mg by mouth every 6 (six) hours as needed (for congestion). 3-4 tablets daily.   Yes  Historical Provider, MD  Coenzyme Q-10 100 MG capsule Take 100 mg by mouth daily.    Yes Historical Provider, MD  famotidine (PEPCID) 20 MG tablet Take 20 mg by mouth as needed for heartburn or indigestion.   Yes Historical Provider, MD  Melatonin 3 MG TABS Take 3 mg by mouth at bedtime.   Yes Historical Provider, MD  Multiple Vitamin (MULTIVITAMIN) tablet Take 1 tablet by mouth 3 (three) times a week.    Yes Historical Provider, MD  nitroGLYCERIN (NITROSTAT) 0.4 MG SL tablet Place 0.4 mg under the tongue every 5 (five) minutes as needed for chest pain.   Yes Historical Provider, MD  Resveratrol 250 MG CAPS Take 1 capsule by mouth daily.   Yes Historical Provider, MD  sildenafil  (VIAGRA) 100 MG tablet Take 100 mg by mouth daily as needed for erectile dysfunction.    Yes Historical Provider, MD    Physical Exam: Blood pressure 90/52, pulse 80, temperature 97.9 F (36.6 C), temperature source Oral, resp. rate 20, height 5\' 9"  (1.753 m), weight 79.379 kg (175 lb), SpO2 98.00%. General: Awake, Oriented x3, No acute distress. HEENT: EOMI, Moist mucous membranes Neck: Supple CV: S1 and S2 Lungs: Clear to ascultation bilaterally Abdomen: Soft, Nontender, Nondistended, +bowel sounds. Ext: Good pulses. Trace edema. No clubbing or cyanosis noted. Neuro: Cranial Nerves II-XII grossly intact. Has 5/5 motor strength in upper and lower extremities.  Lab results:  Recent Labs  03/06/14 2347  NA 139  K 4.8  CL 101  CO2 23  GLUCOSE 128*  BUN 26*  CREATININE 1.06  CALCIUM 8.7    Recent Labs  03/06/14 2347  AST 19  ALT 16  ALKPHOS 49  BILITOT 1.1  PROT 6.3  ALBUMIN 3.7   No results found for this basename: LIPASE, AMYLASE,  in the last 72 hours  Recent Labs  03/06/14 2347  WBC 9.6  NEUTROABS 7.8*  HGB 12.6*  HCT 36.3*  MCV 91.7  PLT 155   No results found for this basename: CKTOTAL, CKMB, CKMBINDEX, TROPONINI,  in the last 72 hours No components found with this basename: POCBNP,  No results found for this basename: DDIMER,  in the last 72 hours No results found for this basename: HGBA1C,  in the last 72 hours No results found for this basename: CHOL, HDL, LDLCALC, TRIG, CHOLHDL, LDLDIRECT,  in the last 72 hours No results found for this basename: TSH, T4TOTAL, FREET3, T3FREE, THYROIDAB,  in the last 72 hours No results found for this basename: VITAMINB12, FOLATE, FERRITIN, TIBC, IRON, RETICCTPCT,  in the last 72 hours Imaging results:  No results found.  Assessment & Plan by Problem: GI bleed Presumed to be diverticular bleed. Will get abdominal x-ray.  Patient will need consultation with his gastroenterologist in the morning.  Start the patient  on pantoprazole twice daily.  Cycle CBC every 8 hours.  Hemoglobin stable at this time.  Patient has been typed and screened.  Mild anemia due to acute blood loss Hemoglobin stable.  Continue to monitor.  Orthostatic hypotension in the setting of GI bleed Patient's antihypertensive medications held for now.  Continue to monitor blood pressure.  Hydrate the patient on IV fluids.  Bradycardia Heart rate in the high 40s and low 50s.  Patient's atenolol held.  Monitor on telemetry.  Diarrhea Send stool for C. difficile and stool culture to rule out infectious causes for diarrhea.  Hypercholesterolemia Stable.  Continue home medications.  Coronary artery disease Aspirin held due  to GI bleed.  Stable.  Hypertension Management as indicated above.  Prophylaxis SCDs, no heparin given concern for bleeding.  CODE STATUS Full code.  Disposition Admit as inpatient to telemetry.  Dr. Maxwell Caul to assume care in the morning.  Time spent on admission, talking to the patient, and coordinating care was: 50 mins.  Chenee Munns A, MD 03/07/2014, 2:16 AM

## 2014-03-07 NOTE — Progress Notes (Signed)
Called ED to get report. Nurse to call back

## 2014-03-07 NOTE — Progress Notes (Signed)
Subjective: No more bleeding overnight No abdominal pain No diarrhrea  Objective: Vital signs in last 24 hours: Temp:  [97.9 F (36.6 C)] 97.9 F (36.6 C) (07/12 2227) Pulse Rate:  [60-80] 80 (07/13 0150) Resp:  [20] 20 (07/13 0150) BP: (90-108)/(52-53) 90/52 mmHg (07/13 0150) SpO2:  [98 %] 98 % (07/13 0150) Weight:  [79.379 kg (175 lb)] 79.379 kg (175 lb) (07/12 2227) Weight change:  Last BM Date: 03/06/14  Intake/Output from previous day:   Intake/Output this shift:    General appearance: alert Resp: clear to auscultation bilaterally Cardio: regular rate and rhythm GI: soft, non-tender; bowel sounds normal; no masses,  no organomegaly  Lab Results:  Recent Labs  03/06/14 2347  WBC 9.6  HGB 12.6*  HCT 36.3*  PLT 155   BMET  Recent Labs  03/06/14 2347  NA 139  K 4.8  CL 101  CO2 23  GLUCOSE 128*  BUN 26*  CREATININE 1.06  CALCIUM 8.7    Studies/Results: Dg Abd Acute W/chest  03/07/2014   CLINICAL DATA:  Rectal bleeding  EXAM: ACUTE ABDOMEN SERIES (ABDOMEN 2 VIEW & CHEST 1 VIEW)  COMPARISON:  08/14/2012 chest radiograph  FINDINGS: There is no evidence of dilated bowel loops or free intraperitoneal air. No radiopaque calculi or other significant radiographic abnormality is seen. Prostate brachytherapy seeds noted.  Heart size and mediastinal contours are within normal limits. There is no edema, consolidation, effusion, or pneumothorax. Calcified pulmonary nodule noted at the left apex. Calcified right axillary nodule/node.  IMPRESSION: Negative abdominal radiographs.  No acute cardiopulmonary disease.   Electronically Signed   By: Jorje Guild M.D.   On: 03/07/2014 02:33    Medications: I have reviewed the patient's current medications.  Assessment/Plan: GI bleed  Presumed to be diverticular bleed. Monitor CBC- exam unremarkable less likely diverticulitis or C diff Similar episode in last October- at that time Bleeding scan positive for sigmond  bleed Mild anemia due to acute blood loss  Hemoglobin stable. Continue to monitor.  Orthostatic hypotension in the setting of GI bleed  Patient's antihypertensive medications held for now. Continue to monitor blood pressure. Hydrate the patient on IV fluids.  Bradycardia  Heart rate in the high 40s and low 50s. Patient's atenolol held. HR better Diarrhea  Had bleeding before with some diarrhrea- no pain, normal WBC-  No more diarrhrea since last night.less likely infection.  Hypercholesterolemia  Stable. Continue home medications.  Coronary artery disease  Aspirin held due to GI bleed. Stable.  Hypertension  Management as indicated above.   LOS: 1 day   Monta Maiorana 03/07/2014, 7:23 AM

## 2014-03-07 NOTE — Consult Note (Signed)
Date: 03/07/2014               Patient Name:  Andrew Marshall MRN: [id number]  DOB: [birth year] Age / Sex: 80 y.o., male   PCP: Andrew Finer, MD         Requesting Physician: Dr. Horton Finer, MD    Consulting Reason:  GI bleed     Chief Complaint: bright red blood per rectum  History of Present Illness: Andrew Marshall is an 80 year old man with diverticular disease presenting with bright red blood per rectum. Started yesterday morning with a "drop" of blood in a formed BM. He had multiple BM after with watery diarrhea and bright red blood. Denies melena. Denies abdominal pain. No BM since admission.  Endorses similar to episode of bleeding in 05/2013. Tagged RBC scan 05/31/2013 positive for bleed at level of sigmoid colon. IR angio of SMA, IMA, and celiac arteries were unremarkable. Last colonoscopy 03/06/2011 which demonstrated diverticulosis and 1 benign polyp was removed.  Endorses lightheadedness. Denies fevers, chills, chest pain, SOB, cough, nausea, vomiting, constipation, dysuria, hematuria, rash, weakness, paresthesias, weight loss.  Meds: Current Facility-Administered Medications  Medication Dose Route Frequency Provider Last Rate Last Dose  . 0.9 %  sodium chloride infusion   Intravenous Continuous Andrew Bellows, MD 75 mL/hr at 03/07/14 0438    . acetaminophen (TYLENOL) tablet 650 mg  650 mg Oral Q6H PRN Andrew Bellows, MD       Or  . acetaminophen (TYLENOL) suppository 650 mg  650 mg Rectal Q6H PRN Andrew Bellows, MD      . atorvastatin (LIPITOR) tablet 40 mg  40 mg Oral Daily Andrew Bellows, MD   40 mg at 03/07/14 5361  . famotidine (PEPCID) tablet 20 mg  20 mg Oral PRN Andrew Bellows, MD      . multivitamin with minerals tablet 1 tablet  1 tablet Oral Once per day on Mon Wed Fri Andrew Bellows, MD   1 tablet at 03/07/14 [phone]  . ondansetron (ZOFRAN) tablet 4 mg  4 mg Oral Q6H PRN Andrew Bellows, MD       Or  . ondansetron (ZOFRAN) injection 4 mg  4 mg Intravenous  Q6H PRN Andrew Bellows, MD      . pantoprazole (PROTONIX) EC tablet 40 mg  40 mg Oral BID Andrew Bellows, MD   40 mg at 03/07/14 0925  . sodium chloride 0.9 % injection 3 mL  3 mL Intravenous Q12H Andrew Bellows, MD        Allergies: Allergies as of 03/06/2014 - Review Complete 03/06/2014  Allergen Reaction Noted  . Erythromycin Nausea And Vomiting and Other (See Comments) 05/31/2013  . Ambien [zolpidem]     Past Medical History  Diagnosis Date  . Heart attack 2001  . Prostate cancer 2008  . Pain, foot, chronic   . Esophageal reflux   . Cough   . Allergic rhinitis   . Pes planus   . Metatarsalgia   . Diverticula of colon   . ASCVD (arteriosclerotic cardiovascular disease)     single vessel  . Essential hypertension, benign   . History of atrial fibrillation     remote history of  . RLS (restless legs syndrome)   . ED (erectile dysfunction)   . H/O laryngeal cancer     superficial  . Coronary atherosclerosis of native coronary artery   . Mitral valve disorders   . Pure hypercholesterolemia   .  Old myocardial infarction    Past Surgical History  Procedure Laterality Date  . Foot arthrodesis    . Esternal beam xrt and seeds for prostate cancer    . Colonoscopy w/ polypectomy     Family History  Problem Relation Age of Onset  . Breast cancer Mother   . Heart disease Father   Brother had Crohns disease. Denies family history of colon CA.  History   Social History  . Marital Status: Married    Spouse Name: N/A    Number of Children: N/A  . Years of Education: N/A   Occupational History  . Not on file.   Social History Main Topics  . Smoking status: Former Research scientist (life sciences)  . Smokeless tobacco: Not on file     Comment: quit 2001  . Alcohol Use: Yes  . Drug Use: No  . Sexual Activity: Not on file   Other Topics Concern  . Not on file   Social History Narrative  . No narrative on file    Review of Systems: Comprehensive review of systems negative except per  HPI.  Physical Exam: Blood pressure 136/75, pulse 80, temperature 97.6 F (36.4 C), temperature source Oral, resp. rate 18, height 5\' 9"  (1.753 m), weight 175 lb (79.379 kg), SpO2 98.00%. BP 136/75  Pulse 80  Temp(Src) 97.6 F (36.4 C) (Oral)  Resp 18  Ht 5\' 9"  (1.753 m)  Wt 175 lb (79.379 kg)  BMI 25.83 kg/m2  SpO2 98%  General Appearance:    Alert, cooperative, no distress  HEENT:    Normocephalic, without obvious abnormality, atraumatic, EOMI, MMM  Neck:   Supple, symmetrical, trachea midline  Lungs:     Clear to auscultation bilaterally, respirations unlabored  Chest wall:    No tenderness or deformity  Heart:    Regular rate and rhythm, S1 and S2 normal, no murmur, rub   or gallop  Abdomen:     Soft, non-tender, bowel sounds active all four quadrants,    no masses, no organomegaly  Extremities:   Extremities normal, atraumatic, no cyanosis or edema  Skin:   Skin color, texture, turgor normal, no rashes or lesions  Neurologic:   Grossly intact    Lab results: Basic Metabolic Panel:  Recent Labs  03/06/14 2347 03/07/14 0730  NA 139 140  K 4.8 4.4  CL 101 104  CO2 23 21  GLUCOSE 128* 112*  BUN 26* 25*  CREATININE 1.06 0.92  CALCIUM 8.7 8.3*   Liver Function Tests:  Recent Labs  03/06/14 2347  AST 19  ALT 16  ALKPHOS 49  BILITOT 1.1  PROT 6.3  ALBUMIN 3.7   CBC:  Recent Labs  03/06/14 2347 03/07/14 0730  WBC 9.6 9.5  NEUTROABS 7.8*  --   HGB 12.6* 11.2*  HCT 36.3* 33.0*  MCV 91.7 90.7  PLT 155 146*   Misc. Labs: Hemoccult: positive  Imaging results:  Dg Abd Acute W/chest  03/07/2014   CLINICAL DATA:  Rectal bleeding  EXAM: ACUTE ABDOMEN SERIES (ABDOMEN 2 VIEW & CHEST 1 VIEW)  COMPARISON:  08/14/2012 chest radiograph  FINDINGS: There is no evidence of dilated bowel loops or free intraperitoneal air. No radiopaque calculi or other significant radiographic abnormality is seen. Prostate brachytherapy seeds noted.  Heart size and mediastinal  contours are within normal limits. There is no edema, consolidation, effusion, or pneumothorax. Calcified pulmonary nodule noted at the left apex. Calcified right axillary nodule/node.  IMPRESSION: Negative abdominal radiographs.  No acute cardiopulmonary  disease.   Electronically Signed   By: Jorje Guild M.D.   On: 03/07/2014 02:33   Assessment, Plan, & Recommendations by Problem: 80 year old man with diverticular disease presenting with bright red blood per rectum. Likely diverticular bleed. Seems to have resolved spontaneously. Recent colonoscopy 3 years ago.  Recommendations -Continue conservative management for now -Advance diet as tolerated slowly  Patient seen and discussed with attending physician, Dr. Paulita Fujita  Signed: Jacques Earthly, MD 03/07/2014, 11:36 AM

## 2014-03-07 NOTE — Progress Notes (Signed)
Pt arrived to floor via stretcher. Pt was oriented to room & call bell system. VS have been taken. Pt in no apparent distress at this time. CCMD has been notified, Pt has been placed on tele & Safety video has been watched.

## 2014-03-07 NOTE — Consult Note (Signed)
[facility] Gastroenterology Consultation Note  Referring Provider:  Dr. Wenda Low Primary Care Physician:  Horton Finer, MD Primary Gastroenterologist:  Dr. Kentley Gell  Reason for Consultation:  hematochezia  HPI: Andrew Marshall is a 80 y.o. male admitted for hematochezia.  Similar episode in October 2014, described in "assessment" below in detail.  Was in static state of health until yesterday afternoon, then had acute onset of several episodes of hematochezia.  No significant abdominal pain.  No weight loss.  Last colonoscopy, for lesser hematochezia, was in 2012 by Dr. Wynetta Emery (incomplete) then Dr. Ivor Messier at [facility], showing inflammatory-appearing polyp, pancolonic diverticulosis, and internal hemorrhoids.  No bleeding since last night.   Past Medical History  Diagnosis Date  . Heart attack 2001  . Prostate cancer 2008  . Pain, foot, chronic   . Esophageal reflux   . Cough   . Allergic rhinitis   . Pes planus   . Metatarsalgia   . Diverticula of colon   . ASCVD (arteriosclerotic cardiovascular disease)     single vessel  . Essential hypertension, benign   . History of atrial fibrillation     remote history of  . RLS (restless legs syndrome)   . ED (erectile dysfunction)   . H/O laryngeal cancer     superficial  . Coronary atherosclerosis of native coronary artery   . Mitral valve disorders   . Pure hypercholesterolemia   . Old myocardial infarction     Past Surgical History  Procedure Laterality Date  . Foot arthrodesis    . Esternal beam xrt and seeds for prostate cancer    . Colonoscopy w/ polypectomy      Prior to Admission medications   Medication Sig Start Date End Date Taking? Authorizing Provider  amLODipine-valsartan (EXFORGE) 5-160 MG per tablet Take 1 tablet by mouth daily. 02/18/14  Yes Candee Furbish, MD  aspirin EC 81 MG tablet Take 81 mg by mouth every evening.   Yes Historical Provider, MD  atenolol (TENORMIN) 25 MG tablet Take 12.5 mg by mouth  daily.   Yes Historical Provider, MD  atorvastatin (LIPITOR) 40 MG tablet Take 1 tablet (40 mg total) by mouth daily. 12/30/13  Yes Candee Furbish, MD  CHLORPHENIRAMINE MALEATE PO Take 4 mg by mouth every 6 (six) hours as needed (for congestion). 3-4 tablets daily.   Yes Historical Provider, MD  Coenzyme Q-10 100 MG capsule Take 100 mg by mouth daily.    Yes Historical Provider, MD  famotidine (PEPCID) 20 MG tablet Take 20 mg by mouth as needed for heartburn or indigestion.   Yes Historical Provider, MD  Melatonin 3 MG TABS Take 3 mg by mouth at bedtime.   Yes Historical Provider, MD  Multiple Vitamin (MULTIVITAMIN) tablet Take 1 tablet by mouth 3 (three) times a week.    Yes Historical Provider, MD  nitroGLYCERIN (NITROSTAT) 0.4 MG SL tablet Place 0.4 mg under the tongue every 5 (five) minutes as needed for chest pain.   Yes Historical Provider, MD  Resveratrol 250 MG CAPS Take 1 capsule by mouth daily.   Yes Historical Provider, MD  sildenafil (VIAGRA) 100 MG tablet Take 100 mg by mouth daily as needed for erectile dysfunction.    Yes Historical Provider, MD    Current Facility-Administered Medications  Medication Dose Route Frequency Provider Last Rate Last Dose  . 0.9 %  sodium chloride infusion   Intravenous Continuous Bynum Bellows, MD 75 mL/hr at 03/07/14 0438    . acetaminophen (TYLENOL)  tablet 650 mg  650 mg Oral Q6H PRN Bynum Bellows, MD       Or  . acetaminophen (TYLENOL) suppository 650 mg  650 mg Rectal Q6H PRN Bynum Bellows, MD      . atorvastatin (LIPITOR) tablet 40 mg  40 mg Oral Daily Bynum Bellows, MD   40 mg at 03/07/14 7209  . famotidine (PEPCID) tablet 20 mg  20 mg Oral PRN Bynum Bellows, MD      . multivitamin with minerals tablet 1 tablet  1 tablet Oral Once per day on Mon Wed Fri Bynum Bellows, MD   1 tablet at 03/07/14 [id number]  . ondansetron (ZOFRAN) tablet 4 mg  4 mg Oral Q6H PRN Bynum Bellows, MD       Or  . ondansetron (ZOFRAN) injection 4 mg  4 mg Intravenous Q6H  PRN Bynum Bellows, MD      . pantoprazole (PROTONIX) EC tablet 40 mg  40 mg Oral BID Bynum Bellows, MD   40 mg at 03/07/14 0925  . sodium chloride 0.9 % injection 3 mL  3 mL Intravenous Q12H Bynum Bellows, MD        Allergies as of 03/06/2014 - Review Complete 03/06/2014  Allergen Reaction Noted  . Erythromycin Nausea And Vomiting and Other (See Comments) 05/31/2013  . Ambien [zolpidem]      Family History  Problem Relation Age of Onset  . Breast cancer Mother   . Heart disease Father     History   Social History  . Marital Status: Married    Spouse Name: N/A    Number of Children: N/A  . Years of Education: N/A   Occupational History  . Not on file.   Social History Main Topics  . Smoking status: Former Research scientist (life sciences)  . Smokeless tobacco: Not on file     Comment: quit 2001  . Alcohol Use: Yes  . Drug Use: No  . Sexual Activity: Not on file   Other Topics Concern  . Not on file   Social History Narrative  . No narrative on file    Review of Systems: Positive = bold Gen: Denies any fever, chills, rigors, night sweats, anorexia, fatigue, weakness, malaise, involuntary weight loss, and sleep disorder CV: Denies chest pain, angina, palpitations, syncope, orthopnea, PND, peripheral edema, and claudication. Resp: Denies dyspnea, cough, sputum, wheezing, coughing up blood. GI: Described in detail in HPI.    GU : Denies urinary burning, blood in urine, urinary frequency, urinary hesitancy, nocturnal urination, and urinary incontinence. MS: Denies joint pain or swelling.  Denies muscle weakness, cramps, atrophy. Flat feet Derm: Denies rash, itching, oral ulcerations, hives, unhealing ulcers.  Psych: Denies depression, anxiety, memory loss, suicidal ideation, hallucinations,  and confusion. Heme: Denies bruising, bleeding, and enlarged lymph nodes. Neuro:  Denies any headaches, dizziness, paresthesias. Endo:  Denies any problems with DM, thyroid, adrenal function.  Physical  Exam: Vital signs in last 24 hours: Temp:  [97.6 F (36.4 C)-97.9 F (36.6 C)] 97.6 F (36.4 C) (07/13 0933) Pulse Rate:  [60-80] 80 (07/13 0150) Resp:  [18-20] 18 (07/13 0933) BP: (90-136)/(52-75) 136/75 mmHg (07/13 0933) SpO2:  [98 %] 98 % (07/13 0150) Weight:  [79.379 kg (175 lb)] 79.379 kg (175 lb) (07/12 2227) Last BM Date: 03/06/14 General:   Alert,  Well-developed, well-nourished, pleasant and cooperative in NAD Head:  Normocephalic and atraumatic. Eyes:  Sclera clear, no icterus.   Conjunctiva pink. Ears:  Normal  auditory acuity. Nose:  No deformity, discharge,  or lesions. Mouth:  No deformity or lesions.  Oropharynx pink & moist. Neck:  Supple; no masses or thyromegaly. Lungs:  Clear throughout to auscultation.   No wheezes, crackles, or rhonchi. No acute distress. Heart:  Regular rate and rhythm; no murmurs, clicks, rubs,  or gallops. Abdomen:  Soft, nontender and nondistended. No masses, hepatosplenomegaly or hernias noted. Normal bowel sounds, without guarding, and without rebound.     Msk:  Symmetrical without gross deformities. Normal posture. Pulses:  Normal pulses noted. Extremities:  Without clubbing or edema. Neurologic:  Alert and  oriented x4;  grossly normal neurologically. Skin:  Intact without significant lesions or rashes. Cervical Nodes:  No significant cervical adenopathy. Psych:  Alert and cooperative. Normal mood and affect.   Lab Results:  Recent Labs  03/06/14 2347 03/07/14 0730  WBC 9.6 9.5  HGB 12.6* 11.2*  HCT 36.3* 33.0*  PLT 155 146*   BMET  Recent Labs  03/06/14 2347 03/07/14 0730  NA 139 140  K 4.8 4.4  CL 101 104  CO2 23 21  GLUCOSE 128* 112*  BUN 26* 25*  CREATININE 1.06 0.92  CALCIUM 8.7 8.3*   LFT  Recent Labs  03/06/14 2347  PROT 6.3  ALBUMIN 3.7  AST 19  ALT 16  ALKPHOS 49  BILITOT 1.1   PT/INR No results found for this basename: LABPROT, INR,  in the last 72 hours  Studies/Results: Dg Abd Acute  W/chest  03/07/2014   CLINICAL DATA:  Rectal bleeding  EXAM: ACUTE ABDOMEN SERIES (ABDOMEN 2 VIEW & CHEST 1 VIEW)  COMPARISON:  08/14/2012 chest radiograph  FINDINGS: There is no evidence of dilated bowel loops or free intraperitoneal air. No radiopaque calculi or other significant radiographic abnormality is seen. Prostate brachytherapy seeds noted.  Heart size and mediastinal contours are within normal limits. There is no edema, consolidation, effusion, or pneumothorax. Calcified pulmonary nodule noted at the left apex. Calcified right axillary nodule/node.  IMPRESSION: Negative abdominal radiographs.  No acute cardiopulmonary disease.   Electronically Signed   By: Jorje Guild M.D.   On: 03/07/2014 02:33   Impression:  1.  Painless hematochezia, resolved.  Known pancolonic diverticulosis.  Suspect diverticular bleeding.  Doubt hemorrhoids.  Similar episode in October 2014, with bleeding on tagged RBC study in sigmoid colon but subsequent negative mesenteric angiogram.  Plan:  1.  No bleeding for the past 12+ hours; would slowly advance diet as tolerated and follow clinically. 2.  If no further bleeding, consider discharge home tomorrow.  If, on the other hand, patient has recurring bleeding, would consider tagged RBC as next step in management. 3.  Will follow; thank you for the consult.   LOS: 1 day   Tyland Klemens M  03/07/2014, 12:46 PM

## 2014-03-08 ENCOUNTER — Telehealth: Payer: Self-pay | Admitting: Cardiology

## 2014-03-08 DIAGNOSIS — I959 Hypotension, unspecified: Secondary | ICD-10-CM | POA: Diagnosis not present

## 2014-03-08 DIAGNOSIS — D509 Iron deficiency anemia, unspecified: Secondary | ICD-10-CM | POA: Diagnosis not present

## 2014-03-08 DIAGNOSIS — K922 Gastrointestinal hemorrhage, unspecified: Secondary | ICD-10-CM | POA: Diagnosis not present

## 2014-03-08 DIAGNOSIS — I10 Essential (primary) hypertension: Secondary | ICD-10-CM | POA: Diagnosis not present

## 2014-03-08 DIAGNOSIS — K5731 Diverticulosis of large intestine without perforation or abscess with bleeding: Secondary | ICD-10-CM | POA: Diagnosis not present

## 2014-03-08 DIAGNOSIS — E860 Dehydration: Secondary | ICD-10-CM | POA: Diagnosis not present

## 2014-03-08 LAB — BASIC METABOLIC PANEL
Anion gap: 11 (ref 5–15)
BUN: 16 mg/dL (ref 6–23)
CO2: 24 mEq/L (ref 19–32)
Calcium: 7.9 mg/dL — ABNORMAL LOW (ref 8.4–10.5)
Chloride: 107 mEq/L (ref 96–112)
Creatinine, Ser: 0.95 mg/dL (ref 0.50–1.35)
GFR calc Af Amer: 89 mL/min — ABNORMAL LOW (ref >90)
GFR calc non Af Amer: 77 mL/min — ABNORMAL LOW (ref >90)
Glucose, Bld: 99 mg/dL (ref 70–99)
Potassium: 4.2 mEq/L (ref 3.7–5.3)
Sodium: 142 mEq/L (ref 137–147)

## 2014-03-08 LAB — CBC
HCT: 28.4 % — ABNORMAL LOW (ref 39.0–52.0)
Hemoglobin: 9.7 g/dL — ABNORMAL LOW (ref 13.0–17.0)
MCH: 31.7 pg (ref 26.0–34.0)
MCHC: 34.2 g/dL (ref 30.0–36.0)
MCV: 92.8 fL (ref 78.0–100.0)
Platelets: 123 10*3/uL — ABNORMAL LOW (ref 150–400)
RBC: 3.06 MIL/uL — ABNORMAL LOW (ref 4.22–5.81)
RDW: 14.2 % (ref 11.5–15.5)
WBC: 5.9 10*3/uL (ref 4.0–10.5)

## 2014-03-08 NOTE — Telephone Encounter (Signed)
**Note De-Identified Andrew Marshall Obfuscation** I attempted to assist the pt over the phone but he only wants to discuss with Dr Marlou Porch. Will forward message to Dr Marlou Porch.

## 2014-03-08 NOTE — Progress Notes (Signed)
Subjective: No further bleeding. No abdominal pain. Tolerating diet.  Objective: Vital signs in last 24 hours: Temp:  [97.4 F (36.3 C)-98.4 F (36.9 C)] 98.4 F (36.9 C) (07/14 0526) Pulse Rate:  [49-55] 54 (07/14 0526) Resp:  [16-18] 18 (07/14 0526) BP: (130-153)/(54-76) 130/60 mmHg (07/14 0526) SpO2:  [99 %-100 %] 99 % (07/14 0526) Weight:  [82 kg (180 lb 12.4 oz)] 82 kg (180 lb 12.4 oz) (07/14 0500) Weight change: 2.621 kg (5 lb 12.4 oz) Last BM Date: 03/07/14  PE: GEN:  NAD  Lab Results: CBC    Component Value Date/Time   WBC 5.9 03/08/2014 0545   RBC 3.06* 03/08/2014 0545   HGB 9.7* 03/08/2014 0545   HCT 28.4* 03/08/2014 0545   PLT 123* 03/08/2014 0545   MCV 92.8 03/08/2014 0545   MCH 31.7 03/08/2014 0545   MCHC 34.2 03/08/2014 0545   RDW 14.2 03/08/2014 0545   LYMPHSABS 1.1 03/06/2014 2347   MONOABS 0.6 03/06/2014 2347   EOSABS 0.1 03/06/2014 2347   BASOSABS 0.1 03/06/2014 2347   Assessment:  1. Painless hematochezia, resolved. Known pancolonic diverticulosis. Suspect diverticular bleeding. Doubt hemorrhoids. Similar episode in October 2014, with bleeding on tagged RBC study in sigmoid colon but subsequent negative mesenteric angiogram.  Plan:  1.  OK to discharge home from GI perspective. 2.  Can follow-up with his primary gastroenterologist, Dr. Emelio Gell [facility]) if desired. 3.  Will sign-off; please call with questions.   Landry Dyke 03/08/2014, 8:22 AM

## 2014-03-08 NOTE — Progress Notes (Signed)
Patient was discharged home by MD order; discharged instructions  review and give to patient with care notes; IV DIC; skin intact; patient will be escorted to the car by volunteer via wheelchair.  

## 2014-03-08 NOTE — Discharge Summary (Signed)
Physician Discharge Summary  Patient ID: Andrew Marshall MRN: [id number] DOB/AGE: 10/19/1930 80 y.o.  Admit date: 03/06/2014 Discharge date: 03/08/2014  Admission Diagnoses:  Discharge Diagnoses:  Principal Problem:   GI bleed- diverticular Bradycardia  Dehydrate Active Problems:   HYPERTENSION   CAD (coronary artery disease)   Pure hypercholesterolemia   Lower gastrointestinal bleeding-diverticular   Orthostatic hypotension   Bradycardia   Diarrhea   Discharged Condition: good  Hospital Course: 80 years old male admitted with lower GI bleed, hypertension. Patient has history of diverticulosis had diverticular bleed last over with positive nuclear scan, had colonoscopy about 3 years ago. Presented with episode of diarrhea a few times before with some blurred/no abdominal pain no fevers no vomiting. Problem #1 lower GI bleed which was guaiac positive in the ER, he was told him to come hemoconcentrated his BUN was slightly elevated at 26. Also found to have low blood pressure. Patient was admitted with IV fluids serial hemoglobins was obtained, patient had more bloody stools Hospital no diarrhea he had formed stool without blood the next day. He did drop his hemoglobin from 12-11.8 and stable at 9.7. Some of them were IV fluids and dilution. No vomiting no abdominal pain. GI was consulted and agreed with supportive care and watching. No intervention needed. His aspirin was held. Tolerated the diet well. Problem 2 hypertension due to dehydration improved with IV fluids. Blood pressure improved restart blood pressure medications. 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 Discontinue atenolol. Monitor outpatient heart rate. Clinically asymptomatic Problem #4 history of CAD stable  Consults: GI  Significant Diagnostic Studies: labs: hemoglobin 9.7 blood chemistries normal, UA negative. and radiology: CXR: normal  Treatments: IV  hydration  Discharge Exam: Blood pressure 130/60, pulse 54, temperature 98.4 F (36.9 C), temperature source Oral, resp. rate 18, height 5\' 9"  (1.753 m), weight 82 kg (180 lb 12.4 oz), SpO2 99.00%. General appearance: alert Resp: clear to auscultation bilaterally Cardio: regular rate and rhythm GI: soft, non-tender; bowel sounds normal; no masses,  no organomegaly  Disposition: 01-Home or Self Care  Discharge Instructions   Diet - low sodium heart healthy    Complete by:  As directed      Increase activity slowly    Complete by:  As directed             Medication List    STOP taking these medications       aspirin EC 81 MG tablet     atenolol 25 MG tablet  Commonly known as:  TENORMIN      TAKE these medications       amLODipine-valsartan 5-160 MG per tablet  Commonly known as:  EXFORGE  Take 1 tablet by mouth daily.     atorvastatin 40 MG tablet  Commonly known as:  LIPITOR  Take 1 tablet (40 mg total) by mouth daily.     CHLORPHENIRAMINE MALEATE PO  Take 4 mg by mouth every 6 (six) hours as needed (for congestion). 3-4 tablets daily.     Coenzyme Q-10 100 MG capsule  Take 100 mg by mouth daily.     famotidine 20 MG tablet  Commonly known as:  PEPCID  Take 20 mg by mouth as needed for heartburn or indigestion.     Melatonin 3 MG Tabs  Take 3 mg by mouth at bedtime.     multivitamin tablet  Take 1 tablet by mouth 3 (three) times a  week.     nitroGLYCERIN 0.4 MG SL tablet  Commonly known as:  NITROSTAT  Place 0.4 mg under the tongue every 5 (five) minutes as needed for chest pain.     Resveratrol 250 MG Caps  Take 1 capsule by mouth daily.     sildenafil 100 MG tablet  Commonly known as:  VIAGRA  Take 100 mg by mouth daily as needed for erectile dysfunction.           Follow-up Information   Follow up with Horton Finer, MD In 2 weeks.   Specialty:  Internal Medicine   Contact information:   [address], [place]  [place] [address] [phone]     discharge planning time total 40 minute   Signed: Annalese Stiner 03/08/2014, 7:47 AM

## 2014-03-08 NOTE — Care Management Note (Signed)
    Page 1 of 1   03/08/2014     11:12:40 AM CARE MANAGEMENT NOTE 03/08/2014  Patient:  Andrew Marshall, Andrew Marshall   Account Number:  [id number]  Date Initiated:  03/08/2014  Documentation initiated by:  Tomi Bamberger  Subjective/Objective Assessment:   dx gib  admit- lives with spouse.     Action/Plan:   Anticipated DC Date:  03/08/2014   Anticipated DC Plan:  [place]  CM consult      Choice offered to / List presented to:             Status of service:  Completed, signed off Medicare Important Message given?   (If response is "NO", the following Medicare IM given date fields will be blank) Date Medicare IM given:   Medicare IM given by:   Date Additional Medicare IM given:   Additional Medicare IM given by:    Discharge Disposition:  HOME/SELF CARE  Per UR Regulation:  Reviewed for med. necessity/level of care/duration of stay  If discussed at [place] of Stay Meetings, dates discussed:    Comments:

## 2014-03-08 NOTE — Telephone Encounter (Signed)
New message          Pt would like to discuss a medication change with dr Marlou Porch

## 2014-03-09 NOTE — Telephone Encounter (Signed)
Discussed discontinuation of atenolol on phone. This was done in the setting of GI bleed and hypotension. I agree with holding this medication. He does state however that he has felt an occasional palpitations/pounding of his heart. If this continues, we will likely start metoprolol in the future. He will call me back in a few days if he is still these issues. Candee Furbish, MD

## 2014-03-13 ENCOUNTER — Other Ambulatory Visit: Payer: Self-pay | Admitting: Cardiology

## 2014-03-18 ENCOUNTER — Telehealth: Payer: Self-pay | Admitting: Cardiology

## 2014-03-18 NOTE — Telephone Encounter (Signed)
Pt is aware of MD recommendations. He verbalized understanding.

## 2014-03-18 NOTE — Telephone Encounter (Signed)
Pt state when he was in the hospital 10 days ago atenolol was D/C  Because his HR and BP was down. Pt is a cyclist, and when  he went on a bike ride for 1 1/2 miles  it was not a steep heal; his HR got up to 130 beats/minute, he had no symptoms at that time. Pt states before when he was taking atenolol his heart rate got up to 120 beats/minute even  when cycling stressed himself,  and that was  it what it supposed to be. Pt thinks that  his HR would go higher if he stress himself more without the atenolol. Pt would like to know if he needs to go back on atenolol again just in case his heart rate goes too high when cycling. Pt is aware that this message will be send to MD for recommendations.

## 2014-03-18 NOTE — Telephone Encounter (Signed)
HR was as low as 44 in the hospital. I would rather you stay off atenolol. Thanks. OK to have HR occasionally increase to 130 as long as not in any distress.   We can always readdress in follow up.   Candee Furbish, MD

## 2014-03-18 NOTE — Telephone Encounter (Signed)
Patient has questions heart rate, exercise and medication. please call and advise.

## 2014-03-22 DIAGNOSIS — H7290 Unspecified perforation of tympanic membrane, unspecified ear: Secondary | ICD-10-CM | POA: Diagnosis not present

## 2014-03-22 DIAGNOSIS — H903 Sensorineural hearing loss, bilateral: Secondary | ICD-10-CM | POA: Diagnosis not present

## 2014-03-30 DIAGNOSIS — F4320 Adjustment disorder, unspecified: Secondary | ICD-10-CM | POA: Diagnosis not present

## 2014-04-07 ENCOUNTER — Encounter: Payer: Self-pay | Admitting: Cardiology

## 2014-04-07 ENCOUNTER — Ambulatory Visit (INDEPENDENT_AMBULATORY_CARE_PROVIDER_SITE_OTHER): Payer: Medicare Other | Admitting: Cardiology

## 2014-04-07 VITALS — BP 140/62 | HR 80 | Ht 69.5 in | Wt 184.8 lb

## 2014-04-07 DIAGNOSIS — E7800 Pure hypercholesterolemia, unspecified: Secondary | ICD-10-CM

## 2014-04-07 DIAGNOSIS — I498 Other specified cardiac arrhythmias: Secondary | ICD-10-CM

## 2014-04-07 DIAGNOSIS — I10 Essential (primary) hypertension: Secondary | ICD-10-CM

## 2014-04-07 DIAGNOSIS — R001 Bradycardia, unspecified: Secondary | ICD-10-CM

## 2014-04-07 DIAGNOSIS — I2510 Atherosclerotic heart disease of native coronary artery without angina pectoris: Secondary | ICD-10-CM

## 2014-04-07 DIAGNOSIS — E782 Mixed hyperlipidemia: Secondary | ICD-10-CM | POA: Diagnosis not present

## 2014-04-07 NOTE — Progress Notes (Signed)
[facility]. [address] [place], [place]  [address] Phone: [phone] Fax:  [phone]  Date:  04/07/2014   ID:  Andrew Marshall, DOB 1930-10-15, MRN [id number]  PCP:  Horton Finer, MD   History of Present Illness: Andrew Marshall is a 80 y.o. male with a hx of posterior wall myocardial infarction 9/01 with multiple revascularizations of the proximal left circumflex/OM, DES in 2003 of OM, mild to moderate (maybe worse based on last cath 2008) mitral regurgitation (10/13), and remote hx of atrial fibrillation.   Recent diverticular bleed. Off ASA.  Off atenolol because of bradycardia in hospital 44. Dr. Lysle Rubens.   At the time of his heart attack he felt a chest fullness but no overt chest pain. He deneis any syncope, presyncope, nor SOB/PND, no swelling. He has been doing quite well. Cycling. Working for [employer], Production assistant, radio.  NUC 10/13- posterior lateral infarct with mild perinfarct ischemia. Normal EF.  His cycles.    Wt Readings from Last 3 Encounters:  04/07/14 184 lb 12.8 oz (83.825 kg)  03/08/14 180 lb 12.4 oz (82 kg)  11/23/13 188 lb (85.276 kg)     Past Medical History  Diagnosis Date  . Heart attack 2001  . Prostate cancer 2008  . Pain, foot, chronic   . Esophageal reflux   . Cough   . Allergic rhinitis   . Pes planus   . Metatarsalgia   . Diverticula of colon   . ASCVD (arteriosclerotic cardiovascular disease)     single vessel  . Essential hypertension, benign   . History of atrial fibrillation     remote history of  . RLS (restless legs syndrome)   . ED (erectile dysfunction)   . H/O laryngeal cancer     superficial  . Coronary atherosclerosis of native coronary artery   . Mitral valve disorders   . Pure hypercholesterolemia   . Old myocardial infarction     Past Surgical History  Procedure Laterality Date  . Foot arthrodesis    . Esternal beam xrt and seeds for prostate cancer    . Colonoscopy w/ polypectomy       Current Outpatient Prescriptions  Medication Sig Dispense Refill  . amLODipine-valsartan (EXFORGE) 5-160 MG per tablet TAKE 1 TABLET BY MOUTH DAILY.  30 tablet  3  . atorvastatin (LIPITOR) 40 MG tablet Take 1 tablet (40 mg total) by mouth daily.  90 tablet  2  . CHLORPHENIRAMINE MALEATE PO Take 4 mg by mouth every 6 (six) hours as needed (for congestion). 3-4 tablets daily.      . Coenzyme Q-10 100 MG capsule Take 100 mg by mouth daily.       . famotidine (PEPCID) 20 MG tablet Take 20 mg by mouth as needed for heartburn or indigestion.      . Melatonin 3 MG TABS Take 3 mg by mouth at bedtime.      . Multiple Vitamin (MULTIVITAMIN) tablet Take 1 tablet by mouth 3 (three) times a week.       . nitroGLYCERIN (NITROSTAT) 0.4 MG SL tablet Place 0.4 mg under the tongue every 5 (five) minutes as needed for chest pain.      Marland Kitchen Resveratrol 250 MG CAPS Take 1 capsule by mouth daily.      . sildenafil (VIAGRA) 100 MG tablet Take 100 mg by mouth daily as needed for erectile dysfunction.        No current facility-administered medications for this  visit.    Allergies:    Allergies  Allergen Reactions  . Erythromycin Nausea And Vomiting and Other (See Comments)    GI upset and pains  . Ambien [Zolpidem]     Sleep walk     Social History:  The patient  reports that he has quit smoking. He does not have any smokeless tobacco history on file. He reports that he drinks alcohol. He reports that he does not use illicit drugs.   ROS:  Please see the history of present illness.   Denies any fevers, chills, orthopnea, PND, bleeding    PHYSICAL EXAM: VS:  BP 140/62  Pulse 80  Ht 5' 9.5" (1.765 m)  Wt 184 lb 12.8 oz (83.825 kg)  BMI 26.91 kg/m2 Well nourished, well developed, in no acute distress HEENT: normal Neck: no JVD Cardiac:  normal S1, S2; RRR; no murmur Lungs:  clear to auscultation bilaterally, no wheezing, rhonchi or rales Abd: soft, nontender, no hepatomegaly Ext: no edema Skin:  warm and dry Neuro: no focal abnormalities noted  EKG:  Sinus bradycardia rate 56 with left axis deviation, right bundle branch block     ASSESSMENT AND PLAN:  1. Old myocardial infarction-posterior. 2001. Doing well. 2. Bradycardic  - recently stopped atenolol because of HR 44 in hospital.  3. Former smoker  - now quit.  4. Diverticular bleed - ask GI or Dr. Lysle Rubens when to restart ASA 81.  5. Coronary artery disease-as described above. No anginal symptoms. Cycling. Doing well. 6. Hyperlipidemia-LDL 73 on Crestor 20. Changed to atorvastatin 40 mg. Showed me a handout on Crestor. Checking lipids.  7. Hypertension-usually well controlled. Continue to monitor at home. He has a blood pressure cuff at home. medications reviewed. 8.  Insomnia-Dr. Maxwell Caul.  Signed, Candee Furbish, MD [facility]  04/07/2014 9:18 AM

## 2014-04-07 NOTE — Patient Instructions (Signed)
The current medical regimen is effective;  continue present plan and medications.  Please return for a fasting lipid panel.  Follow up in 6 months with Dr Marlou Porch.  You will receive a letter in the mail 2 months before you are due.  Please call us when you receive this letter to schedule your follow up appointment.

## 2014-04-12 ENCOUNTER — Other Ambulatory Visit (INDEPENDENT_AMBULATORY_CARE_PROVIDER_SITE_OTHER): Payer: Medicare Other

## 2014-04-12 DIAGNOSIS — E782 Mixed hyperlipidemia: Secondary | ICD-10-CM | POA: Diagnosis not present

## 2014-04-12 LAB — LIPID PANEL
Cholesterol: 147 mg/dL (ref 0–200)
HDL: 44.8 mg/dL (ref >39.00)
LDL Cholesterol: 80 mg/dL (ref 0–99)
NonHDL: 102.2
Total CHOL/HDL Ratio: 3
Triglycerides: 109 mg/dL (ref 0.0–149.0)
VLDL: 21.8 mg/dL (ref 0.0–40.0)

## 2014-04-15 ENCOUNTER — Telehealth: Payer: Self-pay | Admitting: Cardiology

## 2014-04-15 NOTE — Telephone Encounter (Signed)
New message    Calling for test results  

## 2014-04-15 NOTE — Telephone Encounter (Signed)
Attempted to call pt back without answer and no voicemail

## 2014-04-18 NOTE — Telephone Encounter (Signed)
Reviewed results with pt who stated understanding. 

## 2014-05-01 DIAGNOSIS — M545 Low back pain, unspecified: Secondary | ICD-10-CM | POA: Diagnosis not present

## 2014-05-01 DIAGNOSIS — R350 Frequency of micturition: Secondary | ICD-10-CM | POA: Diagnosis not present

## 2014-05-05 DIAGNOSIS — M545 Low back pain, unspecified: Secondary | ICD-10-CM | POA: Diagnosis not present

## 2014-05-10 DIAGNOSIS — M47817 Spondylosis without myelopathy or radiculopathy, lumbosacral region: Secondary | ICD-10-CM | POA: Diagnosis not present

## 2014-05-12 DIAGNOSIS — F4320 Adjustment disorder, unspecified: Secondary | ICD-10-CM | POA: Diagnosis not present

## 2014-05-12 DIAGNOSIS — M5106 Intervertebral disc disorders with myelopathy, lumbar region: Secondary | ICD-10-CM | POA: Diagnosis not present

## 2014-05-16 DIAGNOSIS — M5126 Other intervertebral disc displacement, lumbar region: Secondary | ICD-10-CM | POA: Diagnosis not present

## 2014-05-17 DIAGNOSIS — G479 Sleep disorder, unspecified: Secondary | ICD-10-CM | POA: Diagnosis not present

## 2014-05-18 ENCOUNTER — Other Ambulatory Visit: Payer: Medicare Other

## 2014-05-18 DIAGNOSIS — M545 Low back pain, unspecified: Secondary | ICD-10-CM | POA: Diagnosis not present

## 2014-05-18 DIAGNOSIS — M5137 Other intervertebral disc degeneration, lumbosacral region: Secondary | ICD-10-CM | POA: Diagnosis not present

## 2014-05-18 DIAGNOSIS — IMO0002 Reserved for concepts with insufficient information to code with codable children: Secondary | ICD-10-CM | POA: Diagnosis not present

## 2014-05-19 DIAGNOSIS — F4320 Adjustment disorder, unspecified: Secondary | ICD-10-CM | POA: Diagnosis not present

## 2014-05-23 ENCOUNTER — Other Ambulatory Visit: Payer: Medicare Other

## 2014-05-23 ENCOUNTER — Ambulatory Visit: Payer: Medicare Other | Admitting: Cardiology

## 2014-05-25 DIAGNOSIS — F4320 Adjustment disorder, unspecified: Secondary | ICD-10-CM | POA: Diagnosis not present

## 2014-06-01 DIAGNOSIS — F4320 Adjustment disorder, unspecified: Secondary | ICD-10-CM | POA: Diagnosis not present

## 2014-06-02 DIAGNOSIS — M545 Low back pain: Secondary | ICD-10-CM | POA: Diagnosis not present

## 2014-06-02 DIAGNOSIS — M5116 Intervertebral disc disorders with radiculopathy, lumbar region: Secondary | ICD-10-CM | POA: Diagnosis not present

## 2014-06-02 DIAGNOSIS — M5126 Other intervertebral disc displacement, lumbar region: Secondary | ICD-10-CM | POA: Diagnosis not present

## 2014-06-08 DIAGNOSIS — F4320 Adjustment disorder, unspecified: Secondary | ICD-10-CM | POA: Diagnosis not present

## 2014-06-15 DIAGNOSIS — M545 Low back pain: Secondary | ICD-10-CM | POA: Diagnosis not present

## 2014-06-15 DIAGNOSIS — M5116 Intervertebral disc disorders with radiculopathy, lumbar region: Secondary | ICD-10-CM | POA: Diagnosis not present

## 2014-06-15 DIAGNOSIS — F4320 Adjustment disorder, unspecified: Secondary | ICD-10-CM | POA: Diagnosis not present

## 2014-06-21 DIAGNOSIS — M5416 Radiculopathy, lumbar region: Secondary | ICD-10-CM | POA: Diagnosis not present

## 2014-06-21 DIAGNOSIS — Z6826 Body mass index (BMI) 26.0-26.9, adult: Secondary | ICD-10-CM | POA: Diagnosis not present

## 2014-06-21 DIAGNOSIS — I10 Essential (primary) hypertension: Secondary | ICD-10-CM | POA: Diagnosis not present

## 2014-06-23 DIAGNOSIS — F4320 Adjustment disorder, unspecified: Secondary | ICD-10-CM | POA: Diagnosis not present

## 2014-06-30 DIAGNOSIS — F4320 Adjustment disorder, unspecified: Secondary | ICD-10-CM | POA: Diagnosis not present

## 2014-07-06 DIAGNOSIS — F4320 Adjustment disorder, unspecified: Secondary | ICD-10-CM | POA: Diagnosis not present

## 2014-07-13 DIAGNOSIS — F4320 Adjustment disorder, unspecified: Secondary | ICD-10-CM | POA: Diagnosis not present

## 2014-07-19 ENCOUNTER — Telehealth: Payer: Self-pay | Admitting: Cardiology

## 2014-07-19 NOTE — Telephone Encounter (Signed)
New message         Pt bp is high it was taken a couple of min ago and it is 161/77 / pt is requesting to be seen today or tomorrow

## 2014-07-19 NOTE — Telephone Encounter (Signed)
Also states he hasn't eaten salt "for years"

## 2014-07-19 NOTE — Telephone Encounter (Signed)
Patient reports that his BP is higher than he thinks it should be. 161/77 today, 162/74 yesterday. States his brand name Exforge was switched to generic, and he feels that it is not working as it should.

## 2014-07-22 MED ORDER — AMLODIPINE BESYLATE-VALSARTAN 5-160 MG PO TABS: 1.0000 | ORAL_TABLET | Freq: Two times a day (BID) | ORAL | Status: DC

## 2014-07-22 NOTE — Telephone Encounter (Signed)
Per Dr. Marlou Porch, patient needs to double up on the generic Exforge. i advised him to take one in the am and one in the pm. Will send new rx to his pharmacy.

## 2014-07-22 NOTE — Telephone Encounter (Signed)
Double the generic exforge.  Candee Furbish, MD

## 2014-07-27 DIAGNOSIS — F4320 Adjustment disorder, unspecified: Secondary | ICD-10-CM | POA: Diagnosis not present

## 2014-08-03 DIAGNOSIS — F4320 Adjustment disorder, unspecified: Secondary | ICD-10-CM | POA: Diagnosis not present

## 2014-08-08 ENCOUNTER — Telehealth: Payer: Self-pay | Admitting: Cardiology

## 2014-08-08 NOTE — Telephone Encounter (Signed)
New message        Pt states his bp is high   he took it at 10:30 am and it was 168/74   please give pt a call

## 2014-08-08 NOTE — Telephone Encounter (Signed)
Pt is concerned about his BP.  He reports he did increase the Exforge to twice a day as instructed 11/24.  It BP has been being in the 120's for the past couple of weeks but he had trouble sleeping last night and took is BP. It was 163/79 at 2:30 am and 168/74 at 10:30 am today.  Reassurance given.  Instructed pt I will forward this information to Dr Marlou Porch and call him back with any new orders.  He states understanding.

## 2014-08-09 NOTE — Telephone Encounter (Signed)
No changes for now. Continue to monitor.  Candee Furbish, MD

## 2014-08-09 NOTE — Telephone Encounter (Signed)
Spouse aware to have pt call back when he is available.

## 2014-08-09 NOTE — Telephone Encounter (Signed)
Pt aware to continue same medications.  Monitor BPs once a day at random times of the day and let us know after a couple of weeks how they are running.  He states understanding.

## 2014-08-11 DIAGNOSIS — F4320 Adjustment disorder, unspecified: Secondary | ICD-10-CM | POA: Diagnosis not present

## 2014-08-15 DIAGNOSIS — E780 Pure hypercholesterolemia: Secondary | ICD-10-CM | POA: Diagnosis not present

## 2014-08-16 DIAGNOSIS — E780 Pure hypercholesterolemia: Secondary | ICD-10-CM | POA: Diagnosis not present

## 2014-08-16 DIAGNOSIS — Z23 Encounter for immunization: Secondary | ICD-10-CM | POA: Diagnosis not present

## 2014-08-16 DIAGNOSIS — G4700 Insomnia, unspecified: Secondary | ICD-10-CM | POA: Diagnosis not present

## 2014-08-16 DIAGNOSIS — I10 Essential (primary) hypertension: Secondary | ICD-10-CM | POA: Diagnosis not present

## 2014-08-17 DIAGNOSIS — F4320 Adjustment disorder, unspecified: Secondary | ICD-10-CM | POA: Diagnosis not present

## 2014-08-22 DIAGNOSIS — Z6827 Body mass index (BMI) 27.0-27.9, adult: Secondary | ICD-10-CM | POA: Diagnosis not present

## 2014-08-22 DIAGNOSIS — M5416 Radiculopathy, lumbar region: Secondary | ICD-10-CM | POA: Diagnosis not present

## 2014-08-24 DIAGNOSIS — F4320 Adjustment disorder, unspecified: Secondary | ICD-10-CM | POA: Diagnosis not present

## 2014-08-31 DIAGNOSIS — F4320 Adjustment disorder, unspecified: Secondary | ICD-10-CM | POA: Diagnosis not present

## 2014-09-06 DIAGNOSIS — M7742 Metatarsalgia, left foot: Secondary | ICD-10-CM | POA: Diagnosis not present

## 2014-09-08 DIAGNOSIS — F4320 Adjustment disorder, unspecified: Secondary | ICD-10-CM | POA: Diagnosis not present

## 2014-09-14 DIAGNOSIS — F4320 Adjustment disorder, unspecified: Secondary | ICD-10-CM | POA: Diagnosis not present

## 2014-09-29 DIAGNOSIS — F4320 Adjustment disorder, unspecified: Secondary | ICD-10-CM | POA: Diagnosis not present

## 2014-10-05 DIAGNOSIS — F4320 Adjustment disorder, unspecified: Secondary | ICD-10-CM | POA: Diagnosis not present

## 2014-10-10 DIAGNOSIS — M545 Low back pain: Secondary | ICD-10-CM | POA: Diagnosis not present

## 2014-10-10 DIAGNOSIS — M5126 Other intervertebral disc displacement, lumbar region: Secondary | ICD-10-CM | POA: Diagnosis not present

## 2014-10-12 DIAGNOSIS — F4320 Adjustment disorder, unspecified: Secondary | ICD-10-CM | POA: Diagnosis not present

## 2014-10-17 ENCOUNTER — Ambulatory Visit: Payer: Medicare Other | Admitting: Cardiology

## 2014-10-18 DIAGNOSIS — Z974 Presence of external hearing-aid: Secondary | ICD-10-CM | POA: Diagnosis not present

## 2014-10-18 DIAGNOSIS — H903 Sensorineural hearing loss, bilateral: Secondary | ICD-10-CM | POA: Diagnosis not present

## 2014-10-18 DIAGNOSIS — H6123 Impacted cerumen, bilateral: Secondary | ICD-10-CM | POA: Diagnosis not present

## 2014-10-18 DIAGNOSIS — H908 Mixed conductive and sensorineural hearing loss, unspecified: Secondary | ICD-10-CM | POA: Diagnosis not present

## 2014-10-19 DIAGNOSIS — F4320 Adjustment disorder, unspecified: Secondary | ICD-10-CM | POA: Diagnosis not present

## 2014-10-25 DIAGNOSIS — M5116 Intervertebral disc disorders with radiculopathy, lumbar region: Secondary | ICD-10-CM | POA: Diagnosis not present

## 2014-10-26 DIAGNOSIS — F4320 Adjustment disorder, unspecified: Secondary | ICD-10-CM | POA: Diagnosis not present

## 2014-10-26 DIAGNOSIS — H2513 Age-related nuclear cataract, bilateral: Secondary | ICD-10-CM | POA: Diagnosis not present

## 2014-11-02 DIAGNOSIS — F4320 Adjustment disorder, unspecified: Secondary | ICD-10-CM | POA: Diagnosis not present

## 2014-11-03 DIAGNOSIS — M47817 Spondylosis without myelopathy or radiculopathy, lumbosacral region: Secondary | ICD-10-CM | POA: Diagnosis not present

## 2014-11-03 DIAGNOSIS — M5116 Intervertebral disc disorders with radiculopathy, lumbar region: Secondary | ICD-10-CM | POA: Diagnosis not present

## 2014-11-03 DIAGNOSIS — M545 Low back pain: Secondary | ICD-10-CM | POA: Diagnosis not present

## 2014-11-07 DIAGNOSIS — H25043 Posterior subcapsular polar age-related cataract, bilateral: Secondary | ICD-10-CM | POA: Diagnosis not present

## 2014-11-07 DIAGNOSIS — M5416 Radiculopathy, lumbar region: Secondary | ICD-10-CM | POA: Diagnosis not present

## 2014-11-07 DIAGNOSIS — Z6827 Body mass index (BMI) 27.0-27.9, adult: Secondary | ICD-10-CM | POA: Diagnosis not present

## 2014-11-07 DIAGNOSIS — H2513 Age-related nuclear cataract, bilateral: Secondary | ICD-10-CM | POA: Diagnosis not present

## 2014-11-08 DIAGNOSIS — F4320 Adjustment disorder, unspecified: Secondary | ICD-10-CM | POA: Diagnosis not present

## 2014-11-09 ENCOUNTER — Other Ambulatory Visit: Payer: Self-pay

## 2014-11-09 ENCOUNTER — Ambulatory Visit (INDEPENDENT_AMBULATORY_CARE_PROVIDER_SITE_OTHER): Payer: Medicare Other | Admitting: Cardiology

## 2014-11-09 ENCOUNTER — Other Ambulatory Visit: Payer: Self-pay | Admitting: *Deleted

## 2014-11-09 ENCOUNTER — Encounter: Payer: Self-pay | Admitting: Cardiology

## 2014-11-09 VITALS — BP 122/80 | HR 64 | Ht 69.5 in | Wt 185.0 lb

## 2014-11-09 DIAGNOSIS — I10 Essential (primary) hypertension: Secondary | ICD-10-CM | POA: Diagnosis not present

## 2014-11-09 DIAGNOSIS — I2583 Coronary atherosclerosis due to lipid rich plaque: Secondary | ICD-10-CM

## 2014-11-09 DIAGNOSIS — I4510 Unspecified right bundle-branch block: Secondary | ICD-10-CM | POA: Diagnosis not present

## 2014-11-09 DIAGNOSIS — E7800 Pure hypercholesterolemia, unspecified: Secondary | ICD-10-CM | POA: Insufficient documentation

## 2014-11-09 DIAGNOSIS — I252 Old myocardial infarction: Secondary | ICD-10-CM | POA: Diagnosis not present

## 2014-11-09 DIAGNOSIS — E785 Hyperlipidemia, unspecified: Secondary | ICD-10-CM | POA: Diagnosis not present

## 2014-11-09 DIAGNOSIS — I2510 Atherosclerotic heart disease of native coronary artery without angina pectoris: Secondary | ICD-10-CM

## 2014-11-09 MED ORDER — AMLODIPINE BESYLATE-VALSARTAN 5-160 MG PO TABS: 1.0000 | ORAL_TABLET | Freq: Two times a day (BID) | ORAL | Status: DC

## 2014-11-09 NOTE — Progress Notes (Signed)
[facility] [address], [address] [place], [place]  [address] Phone: [phone] Fax:  [phone]  Date:  11/09/2014   ID:  Andrew Marshall, DOB Jan 28, 1931, MRN [id number]  PCP:  Dorian Heckle, MD   History of Present Illness: Andrew Marshall is a 81 y.o. male with a hx of posterior wall myocardial infarction 9/01 with multiple revascularizations of the proximal left circumflex/OM, DES in 2003 of OM, mild to moderate (maybe worse based on last cath 2008) mitral regurgitation (10/13), and remote hx of atrial fibrillation.    Off atenolol because of bradycardia in hospital 44. Dr. Lysle Rubens.   At the time of his heart attack he felt a chest fullness but no overt chest pain. He deneis any syncope, presyncope, nor SOB/PND, no swelling. He has been doing quite well. Cycling. Working for [employer], Production assistant, radio.  NUC 10/13- posterior lateral infarct with mild perinfarct ischemia. Normal EF.  His cycles but has had troubles with back issues.   10/14 and 7/15 recurrent GI bleeding from diverticulosis. He is currently not on aspirin because of this.    Wt Readings from Last 3 Encounters:  11/09/14 185 lb (83.915 kg)  04/07/14 184 lb 12.8 oz (83.825 kg)  11/23/13 188 lb (85.276 kg)     Past Medical History  Diagnosis Date  . Heart attack 2001  . Prostate cancer 2008  . Pain, foot, chronic   . Esophageal reflux   . Cough   . Allergic rhinitis   . Pes planus   . Metatarsalgia   . Diverticula of colon   . ASCVD (arteriosclerotic cardiovascular disease)     single vessel  . Essential hypertension, benign   . History of atrial fibrillation     remote history of  . RLS (restless legs syndrome)   . ED (erectile dysfunction)   . H/O laryngeal cancer     superficial  . Coronary atherosclerosis of native coronary artery   . Mitral valve disorders   . Pure hypercholesterolemia   . Old myocardial infarction     Past Surgical History  Procedure Laterality Date  . Foot  arthrodesis    . Esternal beam xrt and seeds for prostate cancer    . Colonoscopy w/ polypectomy      Current Outpatient Prescriptions  Medication Sig Dispense Refill  . amLODipine-valsartan (EXFORGE) 5-160 MG per tablet Take 1 tablet by mouth 2 (two) times daily. 60 tablet 6  . atorvastatin (LIPITOR) 40 MG tablet Take 1 tablet (40 mg total) by mouth daily. 90 tablet 2  . CHLORPHENIRAMINE MALEATE PO Take 4 mg by mouth every 6 (six) hours as needed (for congestion). 3-4 tablets daily.    . Coenzyme Q-10 100 MG capsule Take 100 mg by mouth daily.     . famotidine (PEPCID) 20 MG tablet Take 20 mg by mouth as needed for heartburn or indigestion.    . Melatonin 3 MG TABS Take 3 mg by mouth at bedtime.    . Multiple Vitamin (MULTIVITAMIN) tablet Take 1 tablet by mouth 3 (three) times a week.     . nitroGLYCERIN (NITROSTAT) 0.4 MG SL tablet Place 0.4 mg under the tongue every 5 (five) minutes as needed for chest pain.    Marland Kitchen Resveratrol 250 MG CAPS Take 1 capsule by mouth daily.    . sildenafil (VIAGRA) 100 MG tablet Take 100 mg by mouth daily as needed for erectile dysfunction.      No current facility-administered medications  for this visit.    Allergies:    Allergies  Allergen Reactions  . Erythromycin Nausea And Vomiting and Other (See Comments)    GI upset and pains  . Ambien [Zolpidem]     Sleep walk     Social History:  The patient  reports that he has quit smoking. He does not have any smokeless tobacco history on file. He reports that he drinks alcohol. He reports that he does not use illicit drugs. former advertiser  ROS:  Please see the history of present illness.   Denies any fevers, chills, orthopnea, PND, bleeding    PHYSICAL EXAM: VS:  BP 122/80 mmHg  Pulse 64  Ht 5' 9.5" (1.765 m)  Wt 185 lb (83.915 kg)  BMI 26.94 kg/m2 Well nourished, well developed, in no acute distress HEENT: normal Neck: no JVD Cardiac:  normal S1, S2; RRR; no murmur Lungs:  clear to  auscultation bilaterally, no wheezing, rhonchi or rales Abd: soft, nontender, no hepatomegaly Ext: no edema Skin: warm and dry Neuro: no focal abnormalities noted  EKG:  11/09/14-sinus rhythm, right bundle branch block, left axis deviation, heart rate 64 bpm, no cigarette can change from prior Sinus bradycardia rate 56 with left axis deviation, right bundle branch block     ASSESSMENT AND PLAN:  1. Old myocardial infarction-posterior. 2001. Doing well. Enjoys red wine. 2. Bradycardic  -  stopped atenolol because of HR 44 in hospital. He does feel some palpitations and we will continue to monitor this. No longer on beta blocker. 3. Right bundle branch block-no change from prior. No high-risk symptoms such as syncope. 4. Former smoker  - now quit.  5. Diverticular bleed - we discussed the risks versus benefits of aspirin therapy and because he has had recurrent GI bleed and has multiple diverticula, it is not unreasonable to continue to hold aspirin in this situation because of the risk of bleeding outweighs the benefit. 6. Coronary artery disease-as described above. No anginal symptoms. Cycling. Doing well. 7. Hyperlipidemia-LDL 73 on Crestor 20. Changed to atorvastatin 40 mg. LDL of 80. Ultimate goal is 70. Continue with diet as well as atorvastatin. 8. Hypertension-usually well controlled. Continue to monitor at home. He has a blood pressure cuff at home. medications reviewed. I asked him to decrease the frequency of which she is checking his blood pressures at home. This is causing some anxiety. Overall he has been doing quite well. 9.  Insomnia-Dr. Maxwell Caul.  Signed, Candee Furbish, MD [facility]  11/09/2014 9:23 AM

## 2014-11-09 NOTE — Patient Instructions (Signed)
Your physician recommends that you continue on your current medications as directed. Please refer to the Current Medication list given to you today.  Your physician wants you to follow-up in: 6 MONTH OV  You will receive a reminder letter in the mail two months in advance. If you don't receive a letter, please call our office to schedule the follow-up appointment.  

## 2014-11-10 ENCOUNTER — Other Ambulatory Visit: Payer: Self-pay | Admitting: Cardiology

## 2014-11-16 DIAGNOSIS — F4320 Adjustment disorder, unspecified: Secondary | ICD-10-CM | POA: Diagnosis not present

## 2014-11-21 ENCOUNTER — Encounter (HOSPITAL_COMMUNITY): Payer: Self-pay | Admitting: *Deleted

## 2014-11-21 ENCOUNTER — Inpatient Hospital Stay (HOSPITAL_COMMUNITY)
Admission: EM | Admit: 2014-11-21 | Discharge: 2014-11-24 | DRG: 243 | Disposition: A | Discharge status: Home / Self Care | Payer: Medicare Other | Attending: Cardiology | Admitting: Cardiology

## 2014-11-21 ENCOUNTER — Telehealth: Payer: Self-pay | Admitting: Physician Assistant

## 2014-11-21 ENCOUNTER — Emergency Department (HOSPITAL_COMMUNITY): Payer: Medicare Other

## 2014-11-21 DIAGNOSIS — E780 Pure hypercholesterolemia: Secondary | ICD-10-CM | POA: Diagnosis present

## 2014-11-21 DIAGNOSIS — K219 Gastro-esophageal reflux disease without esophagitis: Secondary | ICD-10-CM | POA: Diagnosis present

## 2014-11-21 DIAGNOSIS — Z87891 Personal history of nicotine dependence: Secondary | ICD-10-CM | POA: Diagnosis not present

## 2014-11-21 DIAGNOSIS — I4891 Unspecified atrial fibrillation: Secondary | ICD-10-CM | POA: Diagnosis present

## 2014-11-21 DIAGNOSIS — Z8546 Personal history of malignant neoplasm of prostate: Secondary | ICD-10-CM | POA: Diagnosis not present

## 2014-11-21 DIAGNOSIS — I2510 Atherosclerotic heart disease of native coronary artery without angina pectoris: Secondary | ICD-10-CM | POA: Diagnosis present

## 2014-11-21 DIAGNOSIS — J9811 Atelectasis: Secondary | ICD-10-CM | POA: Diagnosis not present

## 2014-11-21 DIAGNOSIS — I248 Other forms of acute ischemic heart disease: Secondary | ICD-10-CM | POA: Diagnosis present

## 2014-11-21 DIAGNOSIS — I340 Nonrheumatic mitral (valve) insufficiency: Secondary | ICD-10-CM | POA: Diagnosis not present

## 2014-11-21 DIAGNOSIS — I442 Atrioventricular block, complete: Secondary | ICD-10-CM | POA: Diagnosis not present

## 2014-11-21 DIAGNOSIS — R001 Bradycardia, unspecified: Secondary | ICD-10-CM | POA: Diagnosis not present

## 2014-11-21 DIAGNOSIS — J939 Pneumothorax, unspecified: Secondary | ICD-10-CM | POA: Diagnosis not present

## 2014-11-21 DIAGNOSIS — K5790 Diverticulosis of intestine, part unspecified, without perforation or abscess without bleeding: Secondary | ICD-10-CM | POA: Diagnosis present

## 2014-11-21 DIAGNOSIS — Z0389 Encounter for observation for other suspected diseases and conditions ruled out: Secondary | ICD-10-CM | POA: Diagnosis not present

## 2014-11-21 DIAGNOSIS — I10 Essential (primary) hypertension: Secondary | ICD-10-CM | POA: Diagnosis present

## 2014-11-21 DIAGNOSIS — Z8521 Personal history of malignant neoplasm of larynx: Secondary | ICD-10-CM | POA: Diagnosis not present

## 2014-11-21 DIAGNOSIS — I214 Non-ST elevation (NSTEMI) myocardial infarction: Secondary | ICD-10-CM | POA: Diagnosis not present

## 2014-11-21 DIAGNOSIS — E785 Hyperlipidemia, unspecified: Secondary | ICD-10-CM | POA: Diagnosis not present

## 2014-11-21 DIAGNOSIS — I252 Old myocardial infarction: Secondary | ICD-10-CM | POA: Diagnosis not present

## 2014-11-21 DIAGNOSIS — I4510 Unspecified right bundle-branch block: Secondary | ICD-10-CM | POA: Diagnosis present

## 2014-11-21 DIAGNOSIS — Z950 Presence of cardiac pacemaker: Secondary | ICD-10-CM | POA: Diagnosis not present

## 2014-11-21 DIAGNOSIS — Z8249 Family history of ischemic heart disease and other diseases of the circulatory system: Secondary | ICD-10-CM | POA: Diagnosis not present

## 2014-11-21 DIAGNOSIS — S27309A Unspecified injury of lung, unspecified, initial encounter: Secondary | ICD-10-CM

## 2014-11-21 DIAGNOSIS — Z959 Presence of cardiac and vascular implant and graft, unspecified: Secondary | ICD-10-CM

## 2014-11-21 LAB — CBC
HCT: 39 % (ref 39.0–52.0)
Hemoglobin: 13.4 g/dL (ref 13.0–17.0)
MCH: 30.9 pg (ref 26.0–34.0)
MCHC: 34.4 g/dL (ref 30.0–36.0)
MCV: 89.9 fL (ref 78.0–100.0)
Platelets: 185 10*3/uL (ref 150–400)
RBC: 4.34 MIL/uL (ref 4.22–5.81)
RDW: 14.6 % (ref 11.5–15.5)
WBC: 10.1 10*3/uL (ref 4.0–10.5)

## 2014-11-21 LAB — BASIC METABOLIC PANEL
Anion gap: 7 (ref 5–15)
BUN: 33 mg/dL — ABNORMAL HIGH (ref 6–23)
CO2: 26 mmol/L (ref 19–32)
Calcium: 9 mg/dL (ref 8.4–10.5)
Chloride: 103 mmol/L (ref 96–112)
Creatinine, Ser: 1.35 mg/dL (ref 0.50–1.35)
GFR calc Af Amer: 55 mL/min — ABNORMAL LOW (ref >90)
GFR calc non Af Amer: 48 mL/min — ABNORMAL LOW (ref >90)
Glucose, Bld: 98 mg/dL (ref 70–99)
Potassium: 4.7 mmol/L (ref 3.5–5.1)
Sodium: 136 mmol/L (ref 135–145)

## 2014-11-21 LAB — MAGNESIUM: Magnesium: 2.3 mg/dL (ref 1.5–2.5)

## 2014-11-21 LAB — I-STAT TROPONIN, ED: Troponin i, poc: 0.11 ng/mL (ref 0.00–0.08)

## 2014-11-21 MED ORDER — ADULT MULTIVITAMIN W/MINERALS CH
1.0000 | ORAL_TABLET | ORAL | Status: DC
  Administered 2014-11-23: 1 via ORAL
  Filled 2014-11-21: qty 1

## 2014-11-21 MED ORDER — SODIUM CHLORIDE 0.9 % IV BOLUS (SEPSIS)
500.0000 mL | Freq: Once | INTRAVENOUS | Status: AC
  Administered 2014-11-21: 500 mL via INTRAVENOUS

## 2014-11-21 MED ORDER — FAMOTIDINE 20 MG PO TABS
20.0000 mg | ORAL_TABLET | ORAL | Status: DC | PRN
  Filled 2014-11-21: qty 1

## 2014-11-21 MED ORDER — ATORVASTATIN CALCIUM 40 MG PO TABS
40.0000 mg | ORAL_TABLET | Freq: Every day | ORAL | Status: DC
  Administered 2014-11-22 – 2014-11-23 (×2): 40 mg via ORAL
  Filled 2014-11-21 (×3): qty 1

## 2014-11-21 MED ORDER — ONDANSETRON HCL 4 MG/2ML IJ SOLN: 4.0000 mg | Freq: Four times a day (QID) | INTRAMUSCULAR | Status: DC | PRN

## 2014-11-21 MED ORDER — ACETAMINOPHEN 325 MG PO TABS
650.0000 mg | ORAL_TABLET | ORAL | Status: DC | PRN
  Administered 2014-11-22: 650 mg via ORAL
  Filled 2014-11-21: qty 2

## 2014-11-21 MED ORDER — COENZYME Q-10 100 MG PO CAPS: 100.0000 mg | ORAL_CAPSULE | Freq: Every day | ORAL | Status: DC

## 2014-11-21 MED ORDER — RESVERATROL 250 MG PO CAPS: 1.0000 | ORAL_CAPSULE | Freq: Every day | ORAL | Status: DC

## 2014-11-21 NOTE — Progress Notes (Signed)
Patient admitted to 2H27 from Emergency department. Report received from [facility] . Assessment completed and oriented patient to surroundings. Discussed with patient the need to call the nurse to use the bathroom. Patient verbalizes understanding in his own words. Asked if he would like to call his wife Caren Griffins , since she is at home prepping for a colonoscopy in the morning, he said yes. Showing patient how to use bedside phone - called his wife, updated her on patient's location , phone number to the patient's room and the nursing unit. Reassurance was given to Caren Griffins and she spoke with her husband.

## 2014-11-21 NOTE — ED Provider Notes (Signed)
CSN: [id number]     Arrival date & time 11/21/14  1950 History   First MD Initiated Contact with Patient 11/21/14 2008     Chief Complaint  Patient presents with  . Bradycardia     (Consider location/radiation/quality/duration/timing/severity/associated sxs/prior Treatment) HPI  81 year old male presents with bradycardia that he noticed today. The patient states he was riding his bike when he was exerting himself he noticed that his heart rate was only 50s and 60s on a heart rate monitor. Typically his heart rate is in the 60s at rest. When he came home he checked his heart rate on a blood pressure monitor and it was low 40s and high 30s. He does not feel dizzy. He has not had any chest pain or shortness of breath. The patient feels normal besides noticing that his heart rate was slower. He is on amlodipine, took this today as typical but has not taken any new or extra medicines. Patient has had left posterior arm pain for about 10 minutes while riding his bike. No associated chest pain or shortness of breath.  Past Medical History  Diagnosis Date  . Heart attack 2001  . Prostate cancer 2008  . Pain, foot, chronic   . Esophageal reflux   . Cough   . Allergic rhinitis   . Pes planus   . Metatarsalgia   . Diverticula of colon   . ASCVD (arteriosclerotic cardiovascular disease)     single vessel  . Essential hypertension, benign   . History of atrial fibrillation     remote history of  . RLS (restless legs syndrome)   . ED (erectile dysfunction)   . H/O laryngeal cancer     superficial  . Coronary atherosclerosis of native coronary artery   . Mitral valve disorders   . Pure hypercholesterolemia   . Old myocardial infarction    Past Surgical History  Procedure Laterality Date  . Foot arthrodesis    . Esternal beam xrt and seeds for prostate cancer    . Colonoscopy w/ polypectomy     Family History  Problem Relation Age of Onset  . Breast cancer Mother   . Heart disease  Father    History  Substance Use Topics  . Smoking status: Former Research scientist (life sciences)  . Smokeless tobacco: Not on file     Comment: quit 2001  . Alcohol Use: Yes    Review of Systems  Respiratory: Negative for shortness of breath.   Cardiovascular: Negative for chest pain.  Neurological: Negative for dizziness.  All other systems reviewed and are negative.     Allergies  Ambien and Erythromycin  Home Medications   Prior to Admission medications   Medication Sig Start Date End Date Taking? Authorizing Provider  amLODipine-valsartan (EXFORGE) 5-160 MG per tablet Take 1 tablet by mouth 2 (two) times daily. 11/09/14  Yes Jerline Pain, MD  atorvastatin (LIPITOR) 40 MG tablet TAKE 1 TABLET (40 MG TOTAL) BY MOUTH DAILY. 11/10/14  Yes Jerline Pain, MD  CHLORPHENIRAMINE MALEATE PO Take 4 mg by mouth every 6 (six) hours as needed (for congestion). 3-4 tablets daily.   Yes Historical Provider, MD  Coenzyme Q-10 100 MG capsule Take 100 mg by mouth daily.    Yes Historical Provider, MD  famotidine (PEPCID) 20 MG tablet Take 20 mg by mouth as needed for heartburn or indigestion.   Yes Historical Provider, MD  Multiple Vitamin (MULTIVITAMIN) tablet Take 1 tablet by mouth 3 (three) times a week.  Yes Historical Provider, MD  Resveratrol 250 MG CAPS Take 1 capsule by mouth daily.   Yes Historical Provider, MD  sildenafil (VIAGRA) 100 MG tablet Take 100 mg by mouth daily as needed for erectile dysfunction.    Yes Historical Provider, MD  nitroGLYCERIN (NITROSTAT) 0.4 MG SL tablet Place 0.4 mg under the tongue every 5 (five) minutes as needed for chest pain.    Historical Provider, MD   BP 131/43 mmHg  Pulse 37  Temp(Src) 98.5 F (36.9 C) (Oral)  Resp 16  Wt 178 lb (80.74 kg)  SpO2 96% Physical Exam  Constitutional: He is oriented to person, place, and time. He appears well-developed and well-nourished.  HENT:  Head: Normocephalic and atraumatic.  Right Ear: External ear normal.  Left Ear:  External ear normal.  Nose: Nose normal.  Eyes: Right eye exhibits no discharge. Left eye exhibits no discharge.  Neck: Neck supple.  Cardiovascular: Regular rhythm, normal heart sounds and intact distal pulses.  Bradycardia present.   Pulmonary/Chest: Effort normal.  Abdominal: Soft. He exhibits no distension. There is no tenderness.  Musculoskeletal: He exhibits no edema.  Neurological: He is alert and oriented to person, place, and time.  Skin: Skin is warm and dry.  Nursing note and vitals reviewed.   ED Course  Procedures (including critical care time) Labs Review Labs Reviewed  BASIC METABOLIC PANEL - Abnormal; Notable for the following:    BUN 33 (*)    GFR calc non Af Amer 48 (*)    GFR calc Af Amer 55 (*)    All other components within normal limits  I-STAT TROPOININ, ED - Abnormal; Notable for the following:    Troponin i, poc 0.11 (*)    All other components within normal limits  MRSA PCR SCREENING  CBC  MAGNESIUM  MAGNESIUM  TSH  BASIC METABOLIC PANEL  CBC  PROTIME-INR  TROPONIN I  TROPONIN I  TROPONIN I    Imaging Review Dg Chest Port 1 View  11/21/2014   CLINICAL DATA:  Bradycardia.  Dizziness.  EXAM: PORTABLE CHEST - 1 VIEW  COMPARISON:  03/07/2014  FINDINGS: The heart is at the upper limits of normal in size with a left ventricular configuration. Pulmonary vasculature is normal. There is no consolidation, pleural effusion, or pneumothorax. No acute osseous abnormalities seen.  IMPRESSION: Heart at the upper limits of normal in size with left ventricular configuration. No acute pulmonary process.   Electronically Signed   By: Jeb Levering M.D.   On: 11/21/2014 21:33     EKG Interpretation   Date/Time:  Monday November 21 2014 19:59:41 EDT Ventricular Rate:  40 PR Interval:    QRS Duration: 130 QT Interval:  572 QTC Calculation: 466 R Axis:   -8 Text Interpretation:  ** Critical Test Result: Arrhythmia , AV Block  Sinus rhythm with complete heart  block and Idioventricular rhythm Right  bundle branch block Abnormal ECG heart block new compared to 2007  Confirmed by Jachob Mcclean  MD, Adyn Hoes ([id number]) on 11/21/2014 8:18:46 PM      CRITICAL CARE Performed by: Sherwood Gambler T   Total critical care time: 30 minutes  Critical care time was exclusive of separately billable procedures and treating other patients.  Critical care was necessary to treat or prevent imminent or life-threatening deterioration.  Critical care was time spent personally by me on the following activities: development of treatment plan with patient and/or surrogate as well as nursing, discussions with consultants, evaluation of patient's response to  treatment, examination of patient, obtaining history from patient or surrogate, ordering and performing treatments and interventions, ordering and review of laboratory studies, ordering and review of radiographic studies, pulse oximetry and re-evaluation of patient's condition.  MDM   Final diagnoses:  Complete heart block    Patient with asymptomatic complete heart block. Patient had left arm pain but had no other anginal type symptoms. Patient is not hypotensive here. No beta blockers or likely cause. Discussed with cardiology, Dr. Tommi Rumps, who is evaluate patient and will admit. Patient stable at this time, does not need emergent pacemaker.     Sherwood Gambler, MD 11/22/14 [phone]

## 2014-11-21 NOTE — ED Notes (Signed)
Dr. Goldston at bedside.  

## 2014-11-21 NOTE — ED Notes (Signed)
Pt in stating he noticed on his wrist heart monitor that his HR was 38 at home, pt states he could feel his pulse pounding, reports episode of dizziness, denies pain

## 2014-11-21 NOTE — Telephone Encounter (Signed)
Mr Muegge called stating his HR is down to 38 at rest.  He went on a long bike ride and his HR monitor went to 55.  Last week it was in the 80's with activity and 60 at rest.  He was a little lightheaded for a short time.  He is not on a beta blocker any more.  I recommended he come to the ER to at least get an EKG.   Netasha Wehrli, PAC

## 2014-11-22 ENCOUNTER — Inpatient Hospital Stay (HOSPITAL_COMMUNITY): Payer: Medicare Other

## 2014-11-22 ENCOUNTER — Encounter (HOSPITAL_COMMUNITY)
Admission: EM | Disposition: A | Discharge status: Home / Self Care | Payer: Self-pay | Source: Home / Self Care | Attending: Cardiology

## 2014-11-22 ENCOUNTER — Encounter (HOSPITAL_COMMUNITY): Payer: Self-pay | Admitting: *Deleted

## 2014-11-22 DIAGNOSIS — I4510 Unspecified right bundle-branch block: Secondary | ICD-10-CM | POA: Insufficient documentation

## 2014-11-22 DIAGNOSIS — E785 Hyperlipidemia, unspecified: Secondary | ICD-10-CM

## 2014-11-22 DIAGNOSIS — I214 Non-ST elevation (NSTEMI) myocardial infarction: Secondary | ICD-10-CM

## 2014-11-22 DIAGNOSIS — I2510 Atherosclerotic heart disease of native coronary artery without angina pectoris: Secondary | ICD-10-CM | POA: Insufficient documentation

## 2014-11-22 DIAGNOSIS — I340 Nonrheumatic mitral (valve) insufficiency: Secondary | ICD-10-CM

## 2014-11-22 DIAGNOSIS — I442 Atrioventricular block, complete: Principal | ICD-10-CM

## 2014-11-22 HISTORY — PX: PERMANENT PACEMAKER INSERTION: SHX5480

## 2014-11-22 LAB — TROPONIN I
Troponin I: 0.08 ng/mL — ABNORMAL HIGH (ref <0.031)
Troponin I: 0.08 ng/mL — ABNORMAL HIGH (ref <0.031)
Troponin I: 0.06 ng/mL — ABNORMAL HIGH (ref <0.031)

## 2014-11-22 LAB — CBC
HCT: 36.4 % — ABNORMAL LOW (ref 39.0–52.0)
Hemoglobin: 12.6 g/dL — ABNORMAL LOW (ref 13.0–17.0)
MCH: 31.3 pg (ref 26.0–34.0)
MCHC: 34.6 g/dL (ref 30.0–36.0)
MCV: 90.5 fL (ref 78.0–100.0)
Platelets: 181 10*3/uL (ref 150–400)
RBC: 4.02 MIL/uL — ABNORMAL LOW (ref 4.22–5.81)
RDW: 14.7 % (ref 11.5–15.5)
WBC: 8.4 10*3/uL (ref 4.0–10.5)

## 2014-11-22 LAB — MAGNESIUM: Magnesium: 2.2 mg/dL (ref 1.5–2.5)

## 2014-11-22 LAB — MRSA PCR SCREENING: MRSA by PCR: NEGATIVE

## 2014-11-22 LAB — BASIC METABOLIC PANEL
Anion gap: 8 (ref 5–15)
BUN: 28 mg/dL — ABNORMAL HIGH (ref 6–23)
CO2: 23 mmol/L (ref 19–32)
Calcium: 8.6 mg/dL (ref 8.4–10.5)
Chloride: 106 mmol/L (ref 96–112)
Creatinine, Ser: 1.21 mg/dL (ref 0.50–1.35)
GFR calc Af Amer: 63 mL/min — ABNORMAL LOW (ref >90)
GFR calc non Af Amer: 54 mL/min — ABNORMAL LOW (ref >90)
Glucose, Bld: 102 mg/dL — ABNORMAL HIGH (ref 70–99)
Potassium: 4.3 mmol/L (ref 3.5–5.1)
Sodium: 137 mmol/L (ref 135–145)

## 2014-11-22 LAB — PROTIME-INR
INR: 1.01 (ref 0.00–1.49)
Prothrombin Time: 13.4 seconds (ref 11.6–15.2)

## 2014-11-22 LAB — TSH: TSH: 1.821 u[IU]/mL (ref 0.350–4.500)

## 2014-11-22 SURGERY — PERMANENT PACEMAKER INSERTION

## 2014-11-22 MED ORDER — NITROGLYCERIN 0.4 MG SL SUBL
SUBLINGUAL_TABLET | SUBLINGUAL | Status: AC
  Administered 2014-11-22: 0.4 mg via SUBLINGUAL
  Filled 2014-11-22: qty 1

## 2014-11-22 MED ORDER — HYDROCODONE-ACETAMINOPHEN 5-325 MG PO TABS
1.0000 | ORAL_TABLET | ORAL | Status: DC | PRN
  Administered 2014-11-22 (×2): 1 via ORAL
  Administered 2014-11-23: 2 via ORAL
  Administered 2014-11-23 – 2014-11-24 (×2): 1 via ORAL
  Filled 2014-11-22 (×2): qty 1
  Filled 2014-11-22: qty 2
  Filled 2014-11-22 (×2): qty 1

## 2014-11-22 MED ORDER — SODIUM CHLORIDE 0.9 % IV SOLN
INTRAVENOUS | Status: DC
  Administered 2014-11-22: 11:00:00 via INTRAVENOUS

## 2014-11-22 MED ORDER — LIDOCAINE HCL (PF) 1 % IJ SOLN
INTRAMUSCULAR | Status: AC
  Filled 2014-11-22: qty 30

## 2014-11-22 MED ORDER — CHLORHEXIDINE GLUCONATE 4 % EX LIQD
60.0000 mL | Freq: Once | CUTANEOUS | Status: AC
  Administered 2014-11-22: 4 via TOPICAL
  Filled 2014-11-22: qty 15

## 2014-11-22 MED ORDER — FENTANYL CITRATE 0.05 MG/ML IJ SOLN
INTRAMUSCULAR | Status: AC
  Administered 2014-11-22: 12.5 ug via INTRAVENOUS
  Filled 2014-11-22: qty 2

## 2014-11-22 MED ORDER — HEPARIN (PORCINE) IN NACL 2-0.9 UNIT/ML-% IJ SOLN
INTRAMUSCULAR | Status: AC
  Filled 2014-11-22: qty 500

## 2014-11-22 MED ORDER — SODIUM CHLORIDE 0.9 % IJ SOLN
3.0000 mL | Freq: Two times a day (BID) | INTRAMUSCULAR | Status: DC
  Administered 2014-11-22 – 2014-11-24 (×4): 3 mL via INTRAVENOUS

## 2014-11-22 MED ORDER — CEFAZOLIN SODIUM-DEXTROSE 2-3 GM-% IV SOLR
2.0000 g | INTRAVENOUS | Status: DC
  Administered 2014-11-22: 2 g via INTRAVENOUS
  Filled 2014-11-22: qty 50

## 2014-11-22 MED ORDER — CHLORHEXIDINE GLUCONATE 4 % EX LIQD
60.0000 mL | Freq: Once | CUTANEOUS | Status: AC
  Filled 2014-11-22: qty 45

## 2014-11-22 MED ORDER — FENTANYL CITRATE 0.05 MG/ML IJ SOLN
12.5000 ug | INTRAMUSCULAR | Status: DC | PRN
  Administered 2014-11-22 – 2014-11-23 (×2): 12.5 ug via INTRAVENOUS
  Filled 2014-11-22 (×2): qty 2

## 2014-11-22 MED ORDER — ONDANSETRON HCL 4 MG/2ML IJ SOLN
4.0000 mg | Freq: Four times a day (QID) | INTRAMUSCULAR | Status: DC | PRN
Start: 2014-11-22 — End: 2014-11-24

## 2014-11-22 MED ORDER — CEFAZOLIN SODIUM 1-5 GM-% IV SOLN
1.0000 g | Freq: Four times a day (QID) | INTRAVENOUS | Status: AC
  Administered 2014-11-22 – 2014-11-23 (×3): 1 g via INTRAVENOUS
  Filled 2014-11-22 (×3): qty 50

## 2014-11-22 MED ORDER — SODIUM CHLORIDE 0.9 % IJ SOLN: 3.0000 mL | INTRAMUSCULAR | Status: DC | PRN

## 2014-11-22 MED ORDER — ACETAMINOPHEN 325 MG PO TABS
325.0000 mg | ORAL_TABLET | ORAL | Status: DC | PRN
  Administered 2014-11-22: 650 mg via ORAL
  Filled 2014-11-22: qty 2

## 2014-11-22 MED ORDER — NITROGLYCERIN 0.4 MG SL SUBL
0.4000 mg | SUBLINGUAL_TABLET | SUBLINGUAL | Status: DC | PRN
  Administered 2014-11-22: 0.4 mg via SUBLINGUAL

## 2014-11-22 MED ORDER — SODIUM CHLORIDE 0.9 % IR SOLN
80.0000 mg | Status: DC
  Administered 2014-11-22: 80 mg
  Filled 2014-11-22 (×2): qty 2

## 2014-11-22 MED ORDER — SODIUM CHLORIDE 0.9 % IV SOLN: 250.0000 mL | INTRAVENOUS | Status: DC | PRN

## 2014-11-22 MED ORDER — MIDAZOLAM HCL 5 MG/5ML IJ SOLN
INTRAMUSCULAR | Status: DC
Start: 2014-11-22 — End: 2014-11-22
  Filled 2014-11-22: qty 5

## 2014-11-22 NOTE — Progress Notes (Signed)
  Echocardiogram 2D Echocardiogram has been performed.  Andrew Marshall 11/22/2014, 10:02 AM

## 2014-11-22 NOTE — Interval H&P Note (Signed)
History and Physical Interval Note:  11/22/2014 11:09 AM  Andrew Marshall  has presented today for surgery, with the diagnosis of hb  The various methods of treatment have been discussed with the patient and family. After consideration of risks, benefits and other options for treatment, the patient has consented to  Procedure(s): PERMANENT PACEMAKER INSERTION (N/A) as a surgical intervention .  The patient's history has been reviewed, patient examined, no change in status, stable for surgery.  I have reviewed the patient's chart and labs.  Questions were answered to the patient's satisfaction.     [name]

## 2014-11-22 NOTE — Op Note (Signed)
SURGEON: [name], MD   PREPROCEDURE DIAGNOSIS: Symptomatic complete heart block  POSTPROCEDURE DIAGNOSIS: Symptomatic complete heart block   PROCEDURES:  1. Left upper extremity venography.  2. Pacemaker implantation.   INTRODUCTION: Andrew Marshall is a 81 y.o. male with a history of symptomatic complete heart block who presents today for pacemaker implantation. The patient reports progressive fatigue and decreased exercise tolerance. No reversible causes have been identified. The patient therefore presents today for pacemaker implantation.   DESCRIPTION OF PROCEDURE: Informed written consent was obtained, and the patient was brought to the electrophysiology lab in a fasting state. The patient received IV Fentanyl as sedation for the procedure today. The patients left chest was prepped and draped in the usual sterile fashion by the EP lab staff. The skin overlying the left deltopectoral region was infiltrated with lidocaine for local analgesia. A 4-cm incision was made over the left deltopectoral region. A left subcutaneous pacemaker pocket was fashioned using a combination of sharp and blunt dissection. Electrocautery was required to assure hemostasis.   RA/RV Lead Placement:  The left axillary vein was cannulated. During this attempt, the patient developed acute onset of L sided pleuritic chest pain. Air was also aspirated from the chest.  I therefore became suspicious for pneumothorax.  A venogram of the left upper extremity was then performed.  This demonstrated a moderate sized left axillary vein which emptied into a moderate sized left subclavian vein.   Through the left axillary vein, a [employer] model [phone] (serial number  [id number]) right atrial lead and a [facility] model [id number] (serial number  [id number]) right ventricular lead were advanced with fluoroscopic visualization into the right atrial appendage and right ventricular apex positions respectively.  Initial atrial  lead P- waves measured 3.6 mV with impedance of 417 ohms and a threshold of 0.7 V at 0.5 msec.  Right ventricular lead R-waves measured 9.8 mV with an impedance of 1000 ohms and a threshold of 0.5 V at 0.5 msec.  Both leads were secured to the pectoralis fascia using #2-0 silk over the suture sleeves.   Device Placement:  The leads were then connected to a [employer] DR  model [id number] (serial number  [id number]) pacemaker.  The pocket was irrigated with copious gentamicin solution.  The pacemaker was then placed into the pocket.  The pocket was then closed in 2 layers with 2.0 Vicryl suture for the subcutaneous and subcuticular layers.  Steri-  Strips and a sterile dressing were then applied. EBL<55ml.  There was concern for pneumothorax (as above).  I therefore obtained a stat CXR which I discussed with radiology (Dr Nevada Crane).  This initial CXR did not reveal ptx.  I will follow him closely clinically.     CONCLUSIONS:   1. Successful implantation of a [employer] DR  dual-chamber pacemaker for symptomatic complete heart block  Permanent Pacemaker Indication: Documented non-reversible symptomatic bradycardia due to second degree and/or third degree atrioventricular block.      [name], MD 11/22/2014 2:55 PM

## 2014-11-22 NOTE — Consult Note (Signed)
Reason for Consult: CHB   Referring Physician:  Dr. Marlou Porch  PCP:  Dorian Heckle, MD  Primary Cardiologist:Dr. Johann Capers Andrew Marshall is an 81 y.o. male.    Chief Complaint:  Dizzy, SOB and slow HR.  HPI: Andrew Marshall is a 81 y.o. male with a hx of posterior wall myocardial infarction 9/01 with multiple revascularizations of the proximal left circumflex/OM, DES in 2003 of OM, mild to moderate (maybe worse based on last cath 2008) mitral regurgitation (10/13), and remote hx of atrial fibrillation. Off atenolol because of bradycardia in hospital 44-- 1.5 years ago.Marland Kitchen  Hx RBBB.  Last NUC 10/13- posterior lateral infarct with mild perinfarct ischemia. Normal EF.  Pt very active with bike riding.  He was dizzy after a ride, and SOB, much more difficult workout than usual.  HR with exertion was 50.  and his HR was 38 on wrist monitor.  He could feel his pulse pounding.  He called and was instructed to come to the ER.  EKG with CHB and Junct rhythm.  BP stable 138/42.    Of note, he has been off a BB due to bradycardia. Off ASA due to recurrent diverticular bleed. No history of fatigue, weight gain, constipation. No tick exposure. Has not been on any nodal agents. He has not felt any sx consistent with prior MI, which he described as a "general feeling of congestion."  Troponin 0.08 X2, and troponin POC 0.11, possible demand ischemia with slow HR and heavy exertion.  Echo ordered.  He was admitted and we have been asked to see for possible pacemaker.  No chest pain, no complaints now, resting.   Past Medical History  Diagnosis Date  . Heart attack 2001  . Prostate cancer 2008  . Pain, foot, chronic   . Esophageal reflux   . Cough   . Allergic rhinitis   . Pes planus   . Metatarsalgia   . Diverticula of colon   . ASCVD (arteriosclerotic cardiovascular disease)     single vessel  . Essential hypertension, benign   . History of atrial fibrillation     remote history of  .  RLS (restless legs syndrome)   . ED (erectile dysfunction)   . H/O laryngeal cancer     superficial  . Coronary atherosclerosis of native coronary artery   . Mitral valve disorders   . Pure hypercholesterolemia   . Old myocardial infarction   . Dysrhythmia     Past Surgical History  Procedure Laterality Date  . Foot arthrodesis    . Esternal beam xrt and seeds for prostate cancer    . Colonoscopy w/ polypectomy    . Cardiac catheterization      Family History  Problem Relation Age of Onset  . Breast cancer Mother   . Heart disease Father    Social History:  reports that he has quit smoking. He does not have any smokeless tobacco history on file. He reports that he drinks alcohol. He reports that he does not use illicit drugs.  Allergies:  Allergies  Allergen Reactions  . Ambien [Zolpidem] Other (See Comments)    Sleep walk   . Erythromycin Nausea And Vomiting and Other (See Comments)    GI upset and pains    Medications Prior to Admission  Medication Sig Dispense Refill  . amLODipine-valsartan (EXFORGE) 5-160 MG per tablet Take 1 tablet by mouth 2 (two) times daily. 60 tablet 6  .  atorvastatin (LIPITOR) 40 MG tablet TAKE 1 TABLET (40 MG TOTAL) BY MOUTH DAILY. 90 tablet 1  . CHLORPHENIRAMINE MALEATE PO Take 4 mg by mouth every 6 (six) hours as needed (for congestion). 3-4 tablets daily.    . Coenzyme Q-10 100 MG capsule Take 100 mg by mouth daily.     . famotidine (PEPCID) 20 MG tablet Take 20 mg by mouth as needed for heartburn or indigestion.    . Multiple Vitamin (MULTIVITAMIN) tablet Take 1 tablet by mouth 3 (three) times a week.     Marland Kitchen Resveratrol 250 MG CAPS Take 1 capsule by mouth daily.    . sildenafil (VIAGRA) 100 MG tablet Take 100 mg by mouth daily as needed for erectile dysfunction.     . nitroGLYCERIN (NITROSTAT) 0.4 MG SL tablet Place 0.4 mg under the tongue every 5 (five) minutes as needed for chest pain.      Results for orders placed or performed during  the hospital encounter of 11/21/14 (from the past 48 hour(s))  CBC     Status: None   Collection Time: 11/21/14  8:32 PM  Result Value Ref Range   WBC 10.1 4.0 - 10.5 K/uL   RBC 4.34 4.22 - 5.81 MIL/uL   Hemoglobin 13.4 13.0 - 17.0 g/dL   HCT 39.0 39.0 - 52.0 %   MCV 89.9 78.0 - 100.0 fL   MCH 30.9 26.0 - 34.0 pg   MCHC 34.4 30.0 - 36.0 g/dL   RDW 14.6 11.5 - 15.5 %   Platelets 185 150 - 400 K/uL  Basic metabolic panel     Status: Abnormal   Collection Time: 11/21/14  8:32 PM  Result Value Ref Range   Sodium 136 135 - 145 mmol/L   Potassium 4.7 3.5 - 5.1 mmol/L   Chloride 103 96 - 112 mmol/L   CO2 26 19 - 32 mmol/L   Glucose, Bld 98 70 - 99 mg/dL   BUN 33 (H) 6 - 23 mg/dL   Creatinine, Ser 1.35 0.50 - 1.35 mg/dL   Calcium 9.0 8.4 - 10.5 mg/dL   GFR calc non Af Amer 48 (L) >90 mL/min   GFR calc Af Amer 55 (L) >90 mL/min    Comment: (NOTE) The eGFR has been calculated using the CKD EPI equation. This calculation has not been validated in all clinical situations. eGFR's persistently <90 mL/min signify possible Chronic Kidney Disease.    Anion gap 7 5 - 15  Magnesium     Status: None   Collection Time: 11/21/14  8:32 PM  Result Value Ref Range   Magnesium 2.3 1.5 - 2.5 mg/dL  I-stat troponin, ED (not at [facility])     Status: Abnormal   Collection Time: 11/21/14  8:39 PM  Result Value Ref Range   Troponin i, poc 0.11 (HH) 0.00 - 0.08 ng/mL   Comment NOTIFIED PHYSICIAN    Comment 3            Comment: Due to the release kinetics of cTnI, a negative result within the first hours of the onset of symptoms does not rule out myocardial infarction with certainty. If myocardial infarction is still suspected, repeat the test at appropriate intervals.   MRSA PCR Screening     Status: None   Collection Time: 11/21/14 11:30 PM  Result Value Ref Range   MRSA by PCR NEGATIVE NEGATIVE    Comment:        The GeneXpert MRSA Assay (FDA approved for NASAL specimens  only), is one component  of a comprehensive MRSA colonization surveillance program. It is not intended to diagnose MRSA infection nor to guide or monitor treatment for MRSA infections.   Troponin I     Status: Abnormal   Collection Time: 11/22/14 12:30 AM  Result Value Ref Range   Troponin I 0.08 (H) <0.031 ng/mL    Comment:        PERSISTENTLY INCREASED TROPONIN VALUES IN THE RANGE OF 0.04-0.49 ng/mL CAN BE SEEN IN:       -UNSTABLE ANGINA       -CONGESTIVE HEART FAILURE       -MYOCARDITIS       -CHEST TRAUMA       -ARRYHTHMIAS       -LATE PRESENTING MYOCARDIAL INFARCTION       -COPD   CLINICAL FOLLOW-UP RECOMMENDED.   Magnesium     Status: None   Collection Time: 11/22/14  3:05 AM  Result Value Ref Range   Magnesium 2.2 1.5 - 2.5 mg/dL  TSH     Status: None   Collection Time: 11/22/14  3:05 AM  Result Value Ref Range   TSH 1.821 0.350 - 4.500 uIU/mL  Basic metabolic panel     Status: Abnormal   Collection Time: 11/22/14  5:05 AM  Result Value Ref Range   Sodium 137 135 - 145 mmol/L   Potassium 4.3 3.5 - 5.1 mmol/L   Chloride 106 96 - 112 mmol/L   CO2 23 19 - 32 mmol/L   Glucose, Bld 102 (H) 70 - 99 mg/dL   BUN 28 (H) 6 - 23 mg/dL   Creatinine, Ser 1.21 0.50 - 1.35 mg/dL   Calcium 8.6 8.4 - 10.5 mg/dL   GFR calc non Af Amer 54 (L) >90 mL/min   GFR calc Af Amer 63 (L) >90 mL/min    Comment: (NOTE) The eGFR has been calculated using the CKD EPI equation. This calculation has not been validated in all clinical situations. eGFR's persistently <90 mL/min signify possible Chronic Kidney Disease.    Anion gap 8 5 - 15  CBC     Status: Abnormal   Collection Time: 11/22/14  5:05 AM  Result Value Ref Range   WBC 8.4 4.0 - 10.5 K/uL   RBC 4.02 (L) 4.22 - 5.81 MIL/uL   Hemoglobin 12.6 (L) 13.0 - 17.0 g/dL   HCT 36.4 (L) 39.0 - 52.0 %   MCV 90.5 78.0 - 100.0 fL   MCH 31.3 26.0 - 34.0 pg   MCHC 34.6 30.0 - 36.0 g/dL   RDW 14.7 11.5 - 15.5 %   Platelets 181 150 - 400 K/uL  Protime-INR      Status: None   Collection Time: 11/22/14  5:05 AM  Result Value Ref Range   Prothrombin Time 13.4 11.6 - 15.2 seconds   INR 1.01 0.00 - 1.49  Troponin I (q 6hr x 3)     Status: Abnormal   Collection Time: 11/22/14  5:05 AM  Result Value Ref Range   Troponin I 0.08 (H) <0.031 ng/mL    Comment:        PERSISTENTLY INCREASED TROPONIN VALUES IN THE RANGE OF 0.04-0.49 ng/mL CAN BE SEEN IN:       -UNSTABLE ANGINA       -CONGESTIVE HEART FAILURE       -MYOCARDITIS       -CHEST TRAUMA       -ARRYHTHMIAS       -LATE PRESENTING MYOCARDIAL  INFARCTION       -COPD   CLINICAL FOLLOW-UP RECOMMENDED.    Dg Chest Port 1 View  11/21/2014   CLINICAL DATA:  Bradycardia.  Dizziness.  EXAM: PORTABLE CHEST - 1 VIEW  COMPARISON:  03/07/2014  FINDINGS: The heart is at the upper limits of normal in size with a left ventricular configuration. Pulmonary vasculature is normal. There is no consolidation, pleural effusion, or pneumothorax. No acute osseous abnormalities seen.  IMPRESSION: Heart at the upper limits of normal in size with left ventricular configuration. No acute pulmonary process.   Electronically Signed   By: Jeb Levering M.D.   On: 11/21/2014 21:33    ROS: General:no colds or fevers, no weight changes Skin:no rashes or ulcers HEENT:no blurred vision, no congestion CV:see HPI PUL:see HPI GI:no diarrhea constipation or melena, no indigestion GU:no hematuria, no dysuria MS:no joint pain, no claudication, + back pain with disc  Neuro:no syncope, + lightheadedness with slow HR Endo:no diabetes, no thyroid disease   Blood pressure 122/103, pulse 35, temperature 96.3 F (35.7 C), temperature source Oral, resp. rate 17, height _0  (1.778 m), weight 181 lb 14.1 oz (82.5 kg), SpO2 99 %.  Wt Readings from Last 3 Encounters:  11/21/14 181 lb 14.1 oz (82.5 kg)  11/09/14 185 lb (83.915 kg)  04/07/14 184 lb 12.8 oz (83.825 kg)    PE: General:Pleasant affect, NAD Skin:Warm and dry, brisk  capillary refill HEENT:normocephalic, sclera clear, mucus membranes moist Neck:supple, no JVD, no bruits, no adenopathy  Heart:S1S2 RRR slow with 9-3/2 systolic murmur, no gallup, rub or click Lungs:clear without rales, rhonchi, or wheezes IZT:IWPY, non tender, + BS, do not palpate liver spleen or masses Ext:no lower ext edema, 2+ pedal pulses, 2+ radial pulses Neuro:alert and oriented X 3, MAE, follows commands, + facial symmetry    Assessment/Plan CHB-symptomatic,  off BB for , hx RBBB HR continues in 30s, BP stable. Plan for PPM, discussed with pt, this is pending MD eval.  He is NPO - will give IV fluids. TSH 1.821  CAD with Hx of posterior wall MI 9/01, with multiple revascularizations of prox. LCX/OM, DES in 2003 of OM.   Elevated troponin, may be demand ischemia, not usual pattern of MI.   Mild to mod. MR.  Echo pending  Hx of PAF- remotely none now  HX GI Bleed, from diverticulosis    [facility] R  Nurse Practitioner Certified [place] Pager [phone] or after 5pm or weekends call [phone] 11/22/2014, 8:23 AM   I have seen, examined the patient, and reviewed the above assessment and plan.  On exam, well appearing, bradycardic regular rhythm.  Changes to above are made where necessary.    The patient has symptomatic complete heart block.  I would therefore recommend pacemaker implantation at this time.  Risks, benefits, alternatives to pacemaker implantation were discussed in detail with the patient today. The patient understands that the risks include but are not limited to bleeding, infection, pneumothorax, perforation, tamponade, vascular damage, renal failure, MI, stroke, death,  and lead dislodgement and wishes to proceed. We will therefore schedule the procedure at the next available time.  Echo is pending.  If EF is significantly depressed, will consider LV lead.  Given advanced age, he is not a candidate for ICD.   Co Sign: [name],  MD 11/22/2014 11:07 AM

## 2014-11-22 NOTE — H&P (View-Only) (Signed)
Reason for Consult: CHB   Referring Physician:  Dr. Marlou Porch  PCP:  Dorian Heckle, MD  Primary Cardiologist:Dr. Johann Capers Andrew Marshall is an 81 y.o. male.    Chief Complaint:  Dizzy, SOB and slow HR.  HPI: Andrew Marshall is a 81 y.o. male with a hx of posterior wall myocardial infarction 9/01 with multiple revascularizations of the proximal left circumflex/OM, DES in 2003 of OM, mild to moderate (maybe worse based on last cath 2008) mitral regurgitation (10/13), and remote hx of atrial fibrillation. Off atenolol because of bradycardia in hospital 44-- 1.5 years ago.Marland Kitchen  Hx RBBB.  Last NUC 10/13- posterior lateral infarct with mild perinfarct ischemia. Normal EF.  Pt very active with bike riding.  He was dizzy after a ride, and SOB, much more difficult workout than usual.  HR with exertion was 50.  and his HR was 38 on wrist monitor.  He could feel his pulse pounding.  He called and was instructed to come to the ER.  EKG with CHB and Junct rhythm.  BP stable 138/42.    Of note, he has been off a BB due to bradycardia. Off ASA due to recurrent diverticular bleed. No history of fatigue, weight gain, constipation. No tick exposure. Has not been on any nodal agents. He has not felt any sx consistent with prior MI, which he described as a "general feeling of congestion."  Troponin 0.08 X2, and troponin POC 0.11, possible demand ischemia with slow HR and heavy exertion.  Echo ordered.  He was admitted and we have been asked to see for possible pacemaker.  No chest pain, no complaints now, resting.   Past Medical History  Diagnosis Date  . Heart attack 2001  . Prostate cancer 2008  . Pain, foot, chronic   . Esophageal reflux   . Cough   . Allergic rhinitis   . Pes planus   . Metatarsalgia   . Diverticula of colon   . ASCVD (arteriosclerotic cardiovascular disease)     single vessel  . Essential hypertension, benign   . History of atrial fibrillation     remote history of  .  RLS (restless legs syndrome)   . ED (erectile dysfunction)   . H/O laryngeal cancer     superficial  . Coronary atherosclerosis of native coronary artery   . Mitral valve disorders   . Pure hypercholesterolemia   . Old myocardial infarction   . Dysrhythmia     Past Surgical History  Procedure Laterality Date  . Foot arthrodesis    . Esternal beam xrt and seeds for prostate cancer    . Colonoscopy w/ polypectomy    . Cardiac catheterization      Family History  Problem Relation Age of Onset  . Breast cancer Mother   . Heart disease Father    Social History:  reports that he has quit smoking. He does not have any smokeless tobacco history on file. He reports that he drinks alcohol. He reports that he does not use illicit drugs.  Allergies:  Allergies  Allergen Reactions  . Ambien [Zolpidem] Other (See Comments)    Sleep walk   . Erythromycin Nausea And Vomiting and Other (See Comments)    GI upset and pains    Medications Prior to Admission  Medication Sig Dispense Refill  . amLODipine-valsartan (EXFORGE) 5-160 MG per tablet Take 1 tablet by mouth 2 (two) times daily. 60 tablet 6  .  atorvastatin (LIPITOR) 40 MG tablet TAKE 1 TABLET (40 MG TOTAL) BY MOUTH DAILY. 90 tablet 1  . CHLORPHENIRAMINE MALEATE PO Take 4 mg by mouth every 6 (six) hours as needed (for congestion). 3-4 tablets daily.    . Coenzyme Q-10 100 MG capsule Take 100 mg by mouth daily.     . famotidine (PEPCID) 20 MG tablet Take 20 mg by mouth as needed for heartburn or indigestion.    . Multiple Vitamin (MULTIVITAMIN) tablet Take 1 tablet by mouth 3 (three) times a week.     Marland Kitchen Resveratrol 250 MG CAPS Take 1 capsule by mouth daily.    . sildenafil (VIAGRA) 100 MG tablet Take 100 mg by mouth daily as needed for erectile dysfunction.     . nitroGLYCERIN (NITROSTAT) 0.4 MG SL tablet Place 0.4 mg under the tongue every 5 (five) minutes as needed for chest pain.      Results for orders placed or performed during  the hospital encounter of 11/21/14 (from the past 48 hour(s))  CBC     Status: None   Collection Time: 11/21/14  8:32 PM  Result Value Ref Range   WBC 10.1 4.0 - 10.5 K/uL   RBC 4.34 4.22 - 5.81 MIL/uL   Hemoglobin 13.4 13.0 - 17.0 g/dL   HCT 39.0 39.0 - 52.0 %   MCV 89.9 78.0 - 100.0 fL   MCH 30.9 26.0 - 34.0 pg   MCHC 34.4 30.0 - 36.0 g/dL   RDW 14.6 11.5 - 15.5 %   Platelets 185 150 - 400 K/uL  Basic metabolic panel     Status: Abnormal   Collection Time: 11/21/14  8:32 PM  Result Value Ref Range   Sodium 136 135 - 145 mmol/L   Potassium 4.7 3.5 - 5.1 mmol/L   Chloride 103 96 - 112 mmol/L   CO2 26 19 - 32 mmol/L   Glucose, Bld 98 70 - 99 mg/dL   BUN 33 (H) 6 - 23 mg/dL   Creatinine, Ser 1.35 0.50 - 1.35 mg/dL   Calcium 9.0 8.4 - 10.5 mg/dL   GFR calc non Af Amer 48 (L) >90 mL/min   GFR calc Af Amer 55 (L) >90 mL/min    Comment: (NOTE) The eGFR has been calculated using the CKD EPI equation. This calculation has not been validated in all clinical situations. eGFR's persistently <90 mL/min signify possible Chronic Kidney Disease.    Anion gap 7 5 - 15  Magnesium     Status: None   Collection Time: 11/21/14  8:32 PM  Result Value Ref Range   Magnesium 2.3 1.5 - 2.5 mg/dL  I-stat troponin, ED (not at [facility])     Status: Abnormal   Collection Time: 11/21/14  8:39 PM  Result Value Ref Range   Troponin i, poc 0.11 (HH) 0.00 - 0.08 ng/mL   Comment NOTIFIED PHYSICIAN    Comment 3            Comment: Due to the release kinetics of cTnI, a negative result within the first hours of the onset of symptoms does not rule out myocardial infarction with certainty. If myocardial infarction is still suspected, repeat the test at appropriate intervals.   MRSA PCR Screening     Status: None   Collection Time: 11/21/14 11:30 PM  Result Value Ref Range   MRSA by PCR NEGATIVE NEGATIVE    Comment:        The GeneXpert MRSA Assay (FDA approved for NASAL specimens  only), is one component  of a comprehensive MRSA colonization surveillance program. It is not intended to diagnose MRSA infection nor to guide or monitor treatment for MRSA infections.   Troponin I     Status: Abnormal   Collection Time: 11/22/14 12:30 AM  Result Value Ref Range   Troponin I 0.08 (H) <0.031 ng/mL    Comment:        PERSISTENTLY INCREASED TROPONIN VALUES IN THE RANGE OF 0.04-0.49 ng/mL CAN BE SEEN IN:       -UNSTABLE ANGINA       -CONGESTIVE HEART FAILURE       -MYOCARDITIS       -CHEST TRAUMA       -ARRYHTHMIAS       -LATE PRESENTING MYOCARDIAL INFARCTION       -COPD   CLINICAL FOLLOW-UP RECOMMENDED.   Magnesium     Status: None   Collection Time: 11/22/14  3:05 AM  Result Value Ref Range   Magnesium 2.2 1.5 - 2.5 mg/dL  TSH     Status: None   Collection Time: 11/22/14  3:05 AM  Result Value Ref Range   TSH 1.821 0.350 - 4.500 uIU/mL  Basic metabolic panel     Status: Abnormal   Collection Time: 11/22/14  5:05 AM  Result Value Ref Range   Sodium 137 135 - 145 mmol/L   Potassium 4.3 3.5 - 5.1 mmol/L   Chloride 106 96 - 112 mmol/L   CO2 23 19 - 32 mmol/L   Glucose, Bld 102 (H) 70 - 99 mg/dL   BUN 28 (H) 6 - 23 mg/dL   Creatinine, Ser 1.21 0.50 - 1.35 mg/dL   Calcium 8.6 8.4 - 10.5 mg/dL   GFR calc non Af Amer 54 (L) >90 mL/min   GFR calc Af Amer 63 (L) >90 mL/min    Comment: (NOTE) The eGFR has been calculated using the CKD EPI equation. This calculation has not been validated in all clinical situations. eGFR's persistently <90 mL/min signify possible Chronic Kidney Disease.    Anion gap 8 5 - 15  CBC     Status: Abnormal   Collection Time: 11/22/14  5:05 AM  Result Value Ref Range   WBC 8.4 4.0 - 10.5 K/uL   RBC 4.02 (L) 4.22 - 5.81 MIL/uL   Hemoglobin 12.6 (L) 13.0 - 17.0 g/dL   HCT 36.4 (L) 39.0 - 52.0 %   MCV 90.5 78.0 - 100.0 fL   MCH 31.3 26.0 - 34.0 pg   MCHC 34.6 30.0 - 36.0 g/dL   RDW 14.7 11.5 - 15.5 %   Platelets 181 150 - 400 K/uL  Protime-INR      Status: None   Collection Time: 11/22/14  5:05 AM  Result Value Ref Range   Prothrombin Time 13.4 11.6 - 15.2 seconds   INR 1.01 0.00 - 1.49  Troponin I (q 6hr x 3)     Status: Abnormal   Collection Time: 11/22/14  5:05 AM  Result Value Ref Range   Troponin I 0.08 (H) <0.031 ng/mL    Comment:        PERSISTENTLY INCREASED TROPONIN VALUES IN THE RANGE OF 0.04-0.49 ng/mL CAN BE SEEN IN:       -UNSTABLE ANGINA       -CONGESTIVE HEART FAILURE       -MYOCARDITIS       -CHEST TRAUMA       -ARRYHTHMIAS       -LATE PRESENTING MYOCARDIAL   INFARCTION       -COPD   CLINICAL FOLLOW-UP RECOMMENDED.    Dg Chest Port 1 View  11/21/2014   CLINICAL DATA:  Bradycardia.  Dizziness.  EXAM: PORTABLE CHEST - 1 VIEW  COMPARISON:  03/07/2014  FINDINGS: The heart is at the upper limits of normal in size with a left ventricular configuration. Pulmonary vasculature is normal. There is no consolidation, pleural effusion, or pneumothorax. No acute osseous abnormalities seen.  IMPRESSION: Heart at the upper limits of normal in size with left ventricular configuration. No acute pulmonary process.   Electronically Signed   By: Jeb Levering M.D.   On: 11/21/2014 21:33    ROS: General:no colds or fevers, no weight changes Skin:no rashes or ulcers HEENT:no blurred vision, no congestion CV:see HPI PUL:see HPI GI:no diarrhea constipation or melena, no indigestion GU:no hematuria, no dysuria MS:no joint pain, no claudication, + back pain with disc  Neuro:no syncope, + lightheadedness with slow HR Endo:no diabetes, no thyroid disease   Blood pressure 122/103, pulse 35, temperature 96.3 F (35.7 C), temperature source Oral, resp. rate 17, height _0  (1.778 m), weight 181 lb 14.1 oz (82.5 kg), SpO2 99 %.  Wt Readings from Last 3 Encounters:  11/21/14 181 lb 14.1 oz (82.5 kg)  11/09/14 185 lb (83.915 kg)  04/07/14 184 lb 12.8 oz (83.825 kg)    PE: General:Pleasant affect, NAD Skin:Warm and dry, brisk  capillary refill HEENT:normocephalic, sclera clear, mucus membranes moist Neck:supple, no JVD, no bruits, no adenopathy  Heart:S1S2 RRR slow with 9-3/2 systolic murmur, no gallup, rub or click Lungs:clear without rales, rhonchi, or wheezes IZT:IWPY, non tender, + BS, do not palpate liver spleen or masses Ext:no lower ext edema, 2+ pedal pulses, 2+ radial pulses Neuro:alert and oriented X 3, MAE, follows commands, + facial symmetry    Assessment/Plan CHB-symptomatic,  off BB for , hx RBBB HR continues in 30s, BP stable. Plan for PPM, discussed with pt, this is pending MD eval.  He is NPO - will give IV fluids. TSH 1.821  CAD with Hx of posterior wall MI 9/01, with multiple revascularizations of prox. LCX/OM, DES in 2003 of OM.   Elevated troponin, may be demand ischemia, not usual pattern of MI.   Mild to mod. MR.  Echo pending  Hx of PAF- remotely none now  HX GI Bleed, from diverticulosis    [facility] R  Nurse Practitioner Certified [place] Pager [phone] or after 5pm or weekends call [phone] 11/22/2014, 8:23 AM   I have seen, examined the patient, and reviewed the above assessment and plan.  On exam, well appearing, bradycardic regular rhythm.  Changes to above are made where necessary.    The patient has symptomatic complete heart block.  I would therefore recommend pacemaker implantation at this time.  Risks, benefits, alternatives to pacemaker implantation were discussed in detail with the patient today. The patient understands that the risks include but are not limited to bleeding, infection, pneumothorax, perforation, tamponade, vascular damage, renal failure, MI, stroke, death,  and lead dislodgement and wishes to proceed. We will therefore schedule the procedure at the next available time.  Echo is pending.  If EF is significantly depressed, will consider LV lead.  Given advanced age, he is not a candidate for ICD.   Co Sign: [name],  MD 11/22/2014 11:07 AM

## 2014-11-22 NOTE — Care Management Note (Signed)
    Page 1 of 1   11/22/2014     10:56:00 AM CARE MANAGEMENT NOTE 11/22/2014  Patient:  Andrew Marshall, Andrew Marshall   Account Number:  [id number]  Date Initiated:  11/22/2014  Documentation initiated by:  Elissa Hefty  Subjective/Objective Assessment:   adm w complete heart block     Action/Plan:   lives w wife, pcp dr Santiago Glad s   Anticipated DC Date:     Anticipated DC Plan:  HOME/SELF CARE         Choice offered to / List presented to:             Status of service:   Medicare Important Message given?   (If response is "NO", the following Medicare IM given date fields will be blank) Date Medicare IM given:   Medicare IM given by:   Date Additional Medicare IM given:   Additional Medicare IM given by:    Discharge Disposition:  HOME/SELF CARE  Per UR Regulation:  Reviewed for med. necessity/level of care/duration of stay  If discussed at [place] of Stay Meetings, dates discussed:    Comments:

## 2014-11-22 NOTE — Progress Notes (Signed)
Subjective:  No CP/SOB, CHB on tele  Objective:  Temp:  [96.3 F (35.7 C)-98.6 F (37 C)] 98.6 F (37 C) (03/29 0800) Pulse Rate:  [34-71] 35 (03/29 0400) Resp:  [10-29] 18 (03/29 0800) BP: (114-157)/(20-103) 150/36 mmHg (03/29 0800) SpO2:  [93 %-100 %] 98 % (03/29 0800) Weight:  [178 lb (80.74 kg)-181 lb 14.1 oz (82.5 kg)] 181 lb 14.1 oz (82.5 kg) (03/28 2300) Weight change:   Intake/Output from previous day: 03/28 0701 - 03/29 0700 In: 120 [P.O.:120] Out: 300 [Urine:300]  Intake/Output from this shift:    Physical Exam: General appearance: alert and no distress Neck: no adenopathy, no carotid bruit, no JVD, supple, symmetrical, trachea midline and thyroid not enlarged, symmetric, no tenderness/mass/nodules Lungs: clear to auscultation bilaterally Heart: regular rate and rhythm, S1, S2 normal, no murmur, click, rub or gallop Extremities: extremities normal, atraumatic, no cyanosis or edema  Lab Results: Results for orders placed or performed during the hospital encounter of 11/21/14 (from the past 48 hour(s))  CBC     Status: None   Collection Time: 11/21/14  8:32 PM  Result Value Ref Range   WBC 10.1 4.0 - 10.5 K/uL   RBC 4.34 4.22 - 5.81 MIL/uL   Hemoglobin 13.4 13.0 - 17.0 g/dL   HCT 39.0 39.0 - 52.0 %   MCV 89.9 78.0 - 100.0 fL   MCH 30.9 26.0 - 34.0 pg   MCHC 34.4 30.0 - 36.0 g/dL   RDW 14.6 11.5 - 15.5 %   Platelets 185 150 - 400 K/uL  Basic metabolic panel     Status: Abnormal   Collection Time: 11/21/14  8:32 PM  Result Value Ref Range   Sodium 136 135 - 145 mmol/L   Potassium 4.7 3.5 - 5.1 mmol/L   Chloride 103 96 - 112 mmol/L   CO2 26 19 - 32 mmol/L   Glucose, Bld 98 70 - 99 mg/dL   BUN 33 (H) 6 - 23 mg/dL   Creatinine, Ser 1.35 0.50 - 1.35 mg/dL   Calcium 9.0 8.4 - 10.5 mg/dL   GFR calc non Af Amer 48 (L) >90 mL/min   GFR calc Af Amer 55 (L) >90 mL/min    Comment: (NOTE) The eGFR has been calculated using the CKD EPI equation. This  calculation has not been validated in all clinical situations. eGFR's persistently <90 mL/min signify possible Chronic Kidney Disease.    Anion gap 7 5 - 15  Magnesium     Status: None   Collection Time: 11/21/14  8:32 PM  Result Value Ref Range   Magnesium 2.3 1.5 - 2.5 mg/dL  I-stat troponin, ED (not at [facility])     Status: Abnormal   Collection Time: 11/21/14  8:39 PM  Result Value Ref Range   Troponin i, poc 0.11 (HH) 0.00 - 0.08 ng/mL   Comment NOTIFIED PHYSICIAN    Comment 3            Comment: Due to the release kinetics of cTnI, a negative result within the first hours of the onset of symptoms does not rule out myocardial infarction with certainty. If myocardial infarction is still suspected, repeat the test at appropriate intervals.   MRSA PCR Screening     Status: None   Collection Time: 11/21/14 11:30 PM  Result Value Ref Range   MRSA by PCR NEGATIVE NEGATIVE    Comment:        The GeneXpert MRSA Assay (FDA approved for NASAL  specimens only), is one component of a comprehensive MRSA colonization surveillance program. It is not intended to diagnose MRSA infection nor to guide or monitor treatment for MRSA infections.   Troponin I     Status: Abnormal   Collection Time: 11/22/14 12:30 AM  Result Value Ref Range   Troponin I 0.08 (H) <0.031 ng/mL    Comment:        PERSISTENTLY INCREASED TROPONIN VALUES IN THE RANGE OF 0.04-0.49 ng/mL CAN BE SEEN IN:       -UNSTABLE ANGINA       -CONGESTIVE HEART FAILURE       -MYOCARDITIS       -CHEST TRAUMA       -ARRYHTHMIAS       -LATE PRESENTING MYOCARDIAL INFARCTION       -COPD   CLINICAL FOLLOW-UP RECOMMENDED.   Magnesium     Status: None   Collection Time: 11/22/14  3:05 AM  Result Value Ref Range   Magnesium 2.2 1.5 - 2.5 mg/dL  TSH     Status: None   Collection Time: 11/22/14  3:05 AM  Result Value Ref Range   TSH 1.821 0.350 - 4.500 uIU/mL  Basic metabolic panel     Status: Abnormal   Collection Time:  11/22/14  5:05 AM  Result Value Ref Range   Sodium 137 135 - 145 mmol/L   Potassium 4.3 3.5 - 5.1 mmol/L   Chloride 106 96 - 112 mmol/L   CO2 23 19 - 32 mmol/L   Glucose, Bld 102 (H) 70 - 99 mg/dL   BUN 28 (H) 6 - 23 mg/dL   Creatinine, Ser 1.21 0.50 - 1.35 mg/dL   Calcium 8.6 8.4 - 10.5 mg/dL   GFR calc non Af Amer 54 (L) >90 mL/min   GFR calc Af Amer 63 (L) >90 mL/min    Comment: (NOTE) The eGFR has been calculated using the CKD EPI equation. This calculation has not been validated in all clinical situations. eGFR's persistently <90 mL/min signify possible Chronic Kidney Disease.    Anion gap 8 5 - 15  CBC     Status: Abnormal   Collection Time: 11/22/14  5:05 AM  Result Value Ref Range   WBC 8.4 4.0 - 10.5 K/uL   RBC 4.02 (L) 4.22 - 5.81 MIL/uL   Hemoglobin 12.6 (L) 13.0 - 17.0 g/dL   HCT 36.4 (L) 39.0 - 52.0 %   MCV 90.5 78.0 - 100.0 fL   MCH 31.3 26.0 - 34.0 pg   MCHC 34.6 30.0 - 36.0 g/dL   RDW 14.7 11.5 - 15.5 %   Platelets 181 150 - 400 K/uL  Protime-INR     Status: None   Collection Time: 11/22/14  5:05 AM  Result Value Ref Range   Prothrombin Time 13.4 11.6 - 15.2 seconds   INR 1.01 0.00 - 1.49  Troponin I (q 6hr x 3)     Status: Abnormal   Collection Time: 11/22/14  5:05 AM  Result Value Ref Range   Troponin I 0.08 (H) <0.031 ng/mL    Comment:        PERSISTENTLY INCREASED TROPONIN VALUES IN THE RANGE OF 0.04-0.49 ng/mL CAN BE SEEN IN:       -UNSTABLE ANGINA       -CONGESTIVE HEART FAILURE       -MYOCARDITIS       -CHEST TRAUMA       -ARRYHTHMIAS       -LATE PRESENTING MYOCARDIAL  INFARCTION       -COPD   CLINICAL FOLLOW-UP RECOMMENDED.     Imaging: Imaging results have been reviewed  Tele- RBBB, CHB with VR 36  Assessment/Plan:   1. Active Problems: 2.   Complete heart block 3. H/O CAD  Time Spent Directly with Patient:  20 minutes  Length of Stay:  LOS: 1 day   Pt of Dr. Marlou Porch admitted with weakness and symptomatic CHB. New finding  for him. Denies CP. Low level flat trop (.08). 2D pending. Exam benign. Not on any neg chronotropes. Plan PTVPM today by Dr. [name]. Pt has underlying old RBBB. Labs OK  Andrew Marshall 11/22/2014, 10:22 AM

## 2014-11-22 NOTE — H&P (Addendum)
Patient ID: Andrew Marshall MRN: [id number], DOB/AGE: 05-24-1931   Admit date: 11/21/2014   Primary Physician: Dorian Heckle, MD Primary Cardiologist: Dr. Marlou Porch  Pt. Profile:  Andrew Marshall is a 81 y.o. male with a hx of posterior wall myocardial infarction 9/01 with multiple revascularizations of the proximal left circumflex/OM, DES in 2003 of OM, mild to moderate (maybe worse based on last cath 2008) mitral regurgitation (10/13), and remote hx of atrial fibrillation who presents with CHB.  Problem List  Past Medical History  Diagnosis Date  . Heart attack 2001  . Prostate cancer 2008  . Pain, foot, chronic   . Esophageal reflux   . Cough   . Allergic rhinitis   . Pes planus   . Metatarsalgia   . Diverticula of colon   . ASCVD (arteriosclerotic cardiovascular disease)     single vessel  . Essential hypertension, benign   . History of atrial fibrillation     remote history of  . RLS (restless legs syndrome)   . ED (erectile dysfunction)   . H/O laryngeal cancer     superficial  . Coronary atherosclerosis of native coronary artery   . Mitral valve disorders   . Pure hypercholesterolemia   . Old myocardial infarction     Past Surgical History  Procedure Laterality Date  . Foot arthrodesis    . Esternal beam xrt and seeds for prostate cancer    . Colonoscopy w/ polypectomy       Allergies  Allergies  Allergen Reactions  . Ambien [Zolpidem] Other (See Comments)    Sleep walk   . Erythromycin Nausea And Vomiting and Other (See Comments)    GI upset and pains    HPI  Andrew Marshall is a 81 y.o. male with a hx of posterior wall myocardial infarction 9/01 with multiple revascularizations of the proximal left circumflex/OM, DES in 2003 of OM, mild to moderate (maybe worse based on last cath 2008) mitral regurgitation (10/13), and remote hx of atrial fibrillation who presents with CHB.  Andrew Marshall was in his USOH today and was cycling. He noted that despite  working harder than normal, his HR remained at around 50bpm. He noted that despite this being a typical workout for him, he was much more dyspneic than he would have expected and felt like this was the "workout of my life." He ended up cycling 12 miles in 1 hour. He took a shower and noticed his heart pointing.  He took his pulse and noted it was 37. He had a total of 2 episodes of LH today. No syncope. He presented for evaluation of bradycardia.   On arrival to the ER he was hemodynamically stable and asymptomatic. 138/42 mmHg,100% on RA, HR 37.  ECG demonstrated CHB with known underlying RBBB. CXR demonstrated prominent pulmonary vasculature without edema or effusions. Labs were notable for K 4.23m Cr 1.35, POC TnI 0.11.   Of note, he has been off a BB due to bradycardia.  Off ASA due to recurrent diverticular bleed. No history of fatigue, weight gain, constipation. No tick exposure. Has not been on any nodal agents. He has not felt any sx consistent with prior MI, which he described as a "general feeling of congestion."  Home Medications  Prior to Admission medications   Medication Sig Start Date End Date Taking? Authorizing Provider  amLODipine-valsartan (EXFORGE) 5-160 MG per tablet Take 1 tablet by mouth 2 (two) times daily. 11/09/14  Yes Jerline Pain,  MD  atorvastatin (LIPITOR) 40 MG tablet TAKE 1 TABLET (40 MG TOTAL) BY MOUTH DAILY. 11/10/14  Yes Jerline Pain, MD  CHLORPHENIRAMINE MALEATE PO Take 4 mg by mouth every 6 (six) hours as needed (for congestion). 3-4 tablets daily.   Yes Historical Provider, MD  Coenzyme Q-10 100 MG capsule Take 100 mg by mouth daily.    Yes Historical Provider, MD  famotidine (PEPCID) 20 MG tablet Take 20 mg by mouth as needed for heartburn or indigestion.   Yes Historical Provider, MD  Multiple Vitamin (MULTIVITAMIN) tablet Take 1 tablet by mouth 3 (three) times a week.    Yes Historical Provider, MD  Resveratrol 250 MG CAPS Take 1 capsule by mouth daily.   Yes  Historical Provider, MD  sildenafil (VIAGRA) 100 MG tablet Take 100 mg by mouth daily as needed for erectile dysfunction.    Yes Historical Provider, MD  nitroGLYCERIN (NITROSTAT) 0.4 MG SL tablet Place 0.4 mg under the tongue every 5 (five) minutes as needed for chest pain.    Historical Provider, MD    Family History  Family History  Problem Relation Age of Onset  . Breast cancer Mother   . Heart disease Father     Social History  History   Social History  . Marital Status: Married    Spouse Name: N/A  . Number of Children: N/A  . Years of Education: N/A   Occupational History  . Not on file.   Social History Main Topics  . Smoking status: Former Research scientist (life sciences)  . Smokeless tobacco: Not on file     Comment: quit 2001  . Alcohol Use: Yes  . Drug Use: No  . Sexual Activity: Not on file   Other Topics Concern  . Not on file   Social History Narrative     Review of Systems General:  No chills, fever, night sweats or weight changes.  Cardiovascular:  See HPI Dermatological: No rash, lesions/masses Respiratory: No cough Urologic: No hematuria, dysuria Abdominal:   No nausea, vomiting, diarrhea, bright red blood per rectum, melena, or hematemesis Neurologic:  No visual changes, wkns, changes in mental status. All other systems reviewed and are otherwise negative except as noted above.  Physical Exam  Blood pressure 131/36, pulse 37, temperature 98 F (36.7 C), temperature source Oral, resp. rate 18, height 5\' 10"  (1.778 m), weight 82.5 kg (181 lb 14.1 oz), SpO2 94 %.  General: Pleasant, NAD Psych: Normal affect. Neuro: Alert and oriented X 3. Moves all extremities spontaneously. HEENT: Normal  Neck: Supple without bruits or JVD. Lungs:  Resp regular and unlabored, CTA. Heart: RRR no s3, s4, or murmurs. Abdomen: Soft, non-tender, non-distended, BS + x 4.  Extremities: No clubbing, cyanosis or edema. DP/PT/Radials 2+ and equal bilaterally.  Labs  Troponin [facility] of  Care Test)  Recent Labs  11/21/14 2039  TROPIPOC 0.11*   No results for input(s): CKTOTAL, CKMB, TROPONINI in the last 72 hours. Lab Results  Component Value Date   WBC 10.1 11/21/2014   HGB 13.4 11/21/2014   HCT 39.0 11/21/2014   MCV 89.9 11/21/2014   PLT 185 11/21/2014     Recent Labs Lab 11/21/14 2032  NA 136  K 4.7  CL 103  CO2 26  BUN 33*  CREATININE 1.35  CALCIUM 9.0  GLUCOSE 98   Lab Results  Component Value Date   CHOL 147 04/12/2014   HDL 44.80 04/12/2014   LDLCALC 80 04/12/2014   TRIG 109.0 04/12/2014  No results found for: DDIMER   Radiology/Studies  Dg Chest [facility]  11/21/2014   CLINICAL DATA:  Bradycardia.  Dizziness.  EXAM: PORTABLE CHEST - 1 VIEW  COMPARISON:  03/07/2014  FINDINGS: The heart is at the upper limits of normal in size with a left ventricular configuration. Pulmonary vasculature is normal. There is no consolidation, pleural effusion, or pneumothorax. No acute osseous abnormalities seen.  IMPRESSION: Heart at the upper limits of normal in size with left ventricular configuration. No acute pulmonary process.   Electronically Signed   By: Jeb Levering M.D.   On: 11/21/2014 21:33    ECG  11/21/14 @ 19:59: CHB with underlying RBBB QRS morphology, rate 40bpm. Upright anterior T waves. Compared to prior ECGs, CHB is new and anterior T waves are now upright, which is abnormal in the setting of a RBBB 11/09/14: NSR, RBBB, borderline LAD  ASSESSMENT AND PLAN  Andrew Marshall is a 81 y.o. male with a hx of posterior wall myocardial infarction 9/01 with multiple revascularizations of the proximal left circumflex/OM, DES in 2003 of OM, mild to moderate (maybe worse based on last cath 2008) mitral regurgitation (10/13), and remote hx of atrial fibrillation who presents with CHB.  CHB. Based on history of bradycardia and RBBB suspect this is secondary to progressive age related conduction system disease.  However, borderline TnI will need to  consider ischemia (see below). He has no history c/w tick exposure or hypothroidism. He has a stable escape that and normal BP and so no temp pacer is required. 1. Admit to SDU 2. Tele monitoring 3. TTE to assess LV function and therefore CRT candidacy for BLOCK HF indication 4. TSH 5. NPO for possible PPM so long as ischemia isn't felt to be cause. 6. Hold norvasc/valsartan combo  NSTEMI. Differential includes ACS, HF, or arrhythmia (i.e. CHB). He does have upright T waves in the anterior leads which are abnormal in the setting of a RBBB and this could indicate ischemia. However, it is possible that the escape rhythm is actually ventricular and the repolarization changes are related to a ventricular origin rather than ischemia.  He has a history of recurrent GIB and so initiation of aspirin and/or heparin may not be trivial. Doubt HF given exam and history.  1. Cycle troponins; rise/fall pattern will be essential in determining whether or not elevation is related to ACS. 2. Continue atorvastatin 3. TTE to assess EF and WMA   11/22/2014, 1:31 AM

## 2014-11-23 ENCOUNTER — Inpatient Hospital Stay (HOSPITAL_COMMUNITY): Payer: Medicare Other

## 2014-11-23 ENCOUNTER — Encounter (HOSPITAL_COMMUNITY): Payer: Self-pay | Admitting: Internal Medicine

## 2014-11-23 LAB — BASIC METABOLIC PANEL
Anion gap: 5 (ref 5–15)
BUN: 20 mg/dL (ref 6–23)
CO2: 26 mmol/L (ref 19–32)
Calcium: 8.4 mg/dL (ref 8.4–10.5)
Chloride: 108 mmol/L (ref 96–112)
Creatinine, Ser: 1.07 mg/dL (ref 0.50–1.35)
GFR calc Af Amer: 73 mL/min — ABNORMAL LOW (ref >90)
GFR calc non Af Amer: 63 mL/min — ABNORMAL LOW (ref >90)
Glucose, Bld: 87 mg/dL (ref 70–99)
Potassium: 4.2 mmol/L (ref 3.5–5.1)
Sodium: 139 mmol/L (ref 135–145)

## 2014-11-23 MED ORDER — AMLODIPINE BESYLATE 5 MG PO TABS
5.0000 mg | ORAL_TABLET | Freq: Every day | ORAL | Status: DC
  Administered 2014-11-23 – 2014-11-24 (×2): 5 mg via ORAL
  Filled 2014-11-23 (×2): qty 1

## 2014-11-23 MED ORDER — FLUTICASONE PROPIONATE 50 MCG/ACT NA SUSP
1.0000 | Freq: Every day | NASAL | Status: DC
  Administered 2014-11-23 – 2014-11-24 (×2): 1 via NASAL
  Filled 2014-11-23: qty 16

## 2014-11-23 MED ORDER — IRBESARTAN 150 MG PO TABS
150.0000 mg | ORAL_TABLET | Freq: Every day | ORAL | Status: DC
  Administered 2014-11-23 – 2014-11-24 (×2): 150 mg via ORAL
  Filled 2014-11-23 (×2): qty 1

## 2014-11-23 MED ORDER — LORATADINE 10 MG PO TABS
10.0000 mg | ORAL_TABLET | Freq: Every day | ORAL | Status: DC
  Administered 2014-11-23 – 2014-11-24 (×2): 10 mg via ORAL
  Filled 2014-11-23 (×2): qty 1

## 2014-11-23 NOTE — Progress Notes (Addendum)
SUBJECTIVE: The patient is doing well today.  Chest pain is resolved.  Has incisional site pain.  Denies SOB or any new concerns.  Marland Kitchen atorvastatin  40 mg Oral q1800  . multivitamin with minerals  1 tablet Oral Q M,W,F  . sodium chloride  3 mL Intravenous Q12H      OBJECTIVE: Physical Exam: Filed Vitals:   11/23/14 0318 11/23/14 0400 11/23/14 0600 11/23/14 0800  BP: 169/62 168/66  167/64  Pulse:      Temp: 97.8 F (36.6 C)   97.6 F (36.4 C)  TempSrc: Oral   Oral  Resp: 19 15 23 16   Height:      Weight:      SpO2: 98% 97%  98%    Intake/Output Summary (Last 24 hours) at 11/23/14 0855 Last data filed at 11/23/14 0739  Gross per 24 hour  Intake    520 ml  Output    800 ml  Net   -280 ml    Telemetry reveals sinus rhythm with V pacing  GEN- The patient is well appearing, alert and oriented x 3 today.   Head- normocephalic, atraumatic Eyes-  Sclera clear, conjunctiva pink Ears- hearing intact Oropharynx- clear Neck- supple,   Lungs- Clear to ausculation bilaterally, decreased BS at L apex, normal work of breathing Heart- Regular rate and rhythm, no murmurs, rubs or gallops, PMI not laterally displaced GI- soft, NT, ND, + BS Extremities- no clubbing, cyanosis, or edema Skin- no rash or lesion, pacemaker pocket is without hematoma   LABS: Basic Metabolic Panel:  Recent Labs  11/21/14 2032 11/22/14 0305 11/22/14 0505 11/23/14 0200  NA 136  --  137 139  K 4.7  --  4.3 4.2  CL 103  --  106 108  CO2 26  --  23 26  GLUCOSE 98  --  102* 87  BUN 33*  --  28* 20  CREATININE 1.35  --  1.21 1.07  CALCIUM 9.0  --  8.6 8.4  MG 2.3 2.2  --   --    Liver Function Tests: No results for input(s): AST, ALT, ALKPHOS, BILITOT, PROT, ALBUMIN in the last 72 hours. No results for input(s): LIPASE, AMYLASE in the last 72 hours. CBC:  Recent Labs  11/21/14 2032 11/22/14 0505  WBC 10.1 8.4  HGB 13.4 12.6*  HCT 39.0 36.4*  MCV 89.9 90.5  PLT 185 181   Cardiac  Enzymes:  Recent Labs  11/22/14 0030 11/22/14 0505 11/22/14 1034  TROPONINI 0.08* 0.08* 0.06*   Thyroid Function Tests:  Recent Labs  11/22/14 0305  TSH 1.821   Anemia Panel: No results for input(s): VITAMINB12, FOLATE, FERRITIN, TIBC, IRON, RETICCTPCT in the last 72 hours.  RADIOLOGY: Dg Chest 2 View  11/23/2014   CLINICAL DATA:  Status post placement of pacemaker. Left arm soreness. Initial encounter.  EXAM: CHEST  2 VIEW  COMPARISON:  Chest radiograph performed 11/22/2014  FINDINGS: The lungs are well-aerated. There appears to be a small left apical pneumothorax, measuring approximately 15% of left lung volume. This appears to have been present on the prior study.  Minimal bibasilar atelectasis is noted. No pleural effusion is seen.  The heart is normal in size; the pacemaker is noted overlying the left chest wall, with leads ending overlying the right atrium and right ventricle. No acute osseous abnormalities are seen.  IMPRESSION: Small left apical pneumothorax, measuring approximately 15% of left lung volume. Minimal bibasilar atelectasis noted.  These results were called  by telephone at the time of interpretation on 11/23/2014 at 6:14 am to [place] RN on [facility], who verbally acknowledged these results.   Electronically Signed   By: Garald Balding M.D.   On: 11/23/2014 06:16   Dg Chest 2 View  11/22/2014   CLINICAL DATA:  81 year old male with possible pneumothorax on the left, in the cardiac catheterization lab. Initial encounter.  EXAM: CHEST  2 VIEW  COMPARISON:  11/21/2014.  FINDINGS: Portable AP supine view at 1443 hrs. Mildly radiolucent appearance of the left cardiophrenic sulcus, but no pleural edge identified on the left. No mediastinal shift. A left chest dual lead cardiac pacemaker has been placed. Leads course to the right atrium and RV apex regions. Stable cardiac size and mediastinal contours. Allowing for portable technique, a the lungs otherwise appear clear. Visualized  tracheal air column is within normal limits.  IMPRESSION: 1. No definite pneumothorax on the left; mild increased lucency of the left costophrenic sulcus might indicate an anterior pneumothorax. Upright versus right side down lateral decubitus view of the chest would evaluate further. 2. Left chest dual lead cardiac pacemaker placed with otherwise no adverse features. Study discussed by telephone with Dr. [name] on 11/22/2014 at 1448 hr.   Electronically Signed   By: Genevie Ann M.D.   On: 11/22/2014 15:07   Dg Chest Port 1 View  11/21/2014   CLINICAL DATA:  Bradycardia.  Dizziness.  EXAM: PORTABLE CHEST - 1 VIEW  COMPARISON:  03/07/2014  FINDINGS: The heart is at the upper limits of normal in size with a left ventricular configuration. Pulmonary vasculature is normal. There is no consolidation, pleural effusion, or pneumothorax. No acute osseous abnormalities seen.  IMPRESSION: Heart at the upper limits of normal in size with left ventricular configuration. No acute pulmonary process.   Electronically Signed   By: Jeb Levering M.D.   On: 11/21/2014 21:33    ASSESSMENT AND PLAN:  Active Problems:   Complete heart block   RBBB   Coronary artery disease involving native coronary artery of native heart without angina pectoris  1. Complete heart block Doing well s/p PPM Device interrogation is normal CXR reveals stable leads, very small apical ptx  2. htn Restart home medicines this am  3. CAD No ischemic symptoms  Transfer to telemetry today Repeat CXR tomorrow am Hopefully home in the next 1-2 days  [name], MD 11/23/2014 8:55 AM

## 2014-11-23 NOTE — Progress Notes (Signed)
After pt moved to tele, standing at the window his HR up to 120 a sensing, v pacing, , asymptomatic.  Pt reassured may be due to small pneumothorax that with exertion increased HR.  We will continue to monitor.

## 2014-11-24 ENCOUNTER — Inpatient Hospital Stay (HOSPITAL_COMMUNITY): Payer: Medicare Other

## 2014-11-24 MED ORDER — HYDROCODONE-ACETAMINOPHEN 5-300 MG PO TABS: 1.0000 | ORAL_TABLET | Freq: Four times a day (QID) | ORAL | Status: DC | PRN

## 2014-11-24 NOTE — Discharge Instructions (Signed)
° ° °  Supplemental Discharge Instructions for  Pacemaker  Patients  Activity No heavy lifting or vigorous activity with your left/right arm for 6 to 8 weeks.  Do not raise your left/right arm above your head for one week.   NO DRIVING for 10 days  WOUND CARE - Keep the wound area clean and dry.  Do not get this area wet for one week. No showers for one week; you may shower in 1 week - The tape/steri-strips on your wound will fall off; do not pull them off.  No bandage is needed on the site.  DO  NOT apply any creams, oils, or ointments to the wound area. - If you notice any drainage or discharge from the wound, any swelling or bruising at the site, or you develop a fever > 101? F after you are discharged home, call the office at once.  Special Instructions - You are still able to use cellular telephones; use the ear opposite the side where you have your pacemaker/defibrillator.  Avoid carrying your cellular phone near your device. - When traveling through airports, show security personnel your identification card to avoid being screened in the metal detectors.  Ask the security personnel to use the hand wand. - Avoid arc welding equipment, MRI testing (magnetic resonance imaging), TENS units (transcutaneous nerve stimulators).  Call the office for questions about other devices. - Avoid electrical appliances that are in poor condition or are not properly grounded. - Microwave ovens are safe to be near or to operate.

## 2014-11-24 NOTE — Discharge Summary (Signed)
ELECTROPHYSIOLOGY PROCEDURE DISCHARGE SUMMARY    Patient ID: Andrew Marshall  Admit date: 11/21/2014  8:01 PM  Discharge date: 11/24/2014  Primary Cardiologist: Dr Marlou Porch Electrophysiologist: Dr [name]  Primary Discharge Diagnosis:  Symptomatic complete heart block status post pacemaker implantation this admission  Procedures This Admission:  1.  Implantation of a [employer] dual chamber PPM on 10/24/14 by Dr [name].     2.  CXR on 11/23/14  demonstrated small apical pneumothorax status post device implantation.   Brief HPI:  Andrew Marshall is a 81 y.o. male with a hx of posterior wall myocardial infarction 9/01 with multiple revascularizations of the proximal left circumflex/OM, DES in 2003 of OM, mild to moderate (maybe worse based on last cath 2008) mitral regurgitation (10/13), and remote hx of atrial fibrillation. Off atenolol because of bradycardia in hospital 44-- 1.5 years ago.Marland Kitchen Hx RBBB. Last NUC 10/13- posterior lateral infarct with mild perinfarct ischemia. Normal EF.  Pt very active with bike riding. He was dizzy after a ride, and SOB, much more difficult workout than usual. HR with exertion was 50. and his HR was 38 on wrist monitor. He could feel his pulse pounding. He called and was instructed to come to the ER. EKG with CHB and Junct rhythm. BP stable 138/42. No reversible causes were found.  He was admitted for possible PPM implantation.   Hospital Course:  The patient was admitted and underwent implantation of a dual chamber PPM as outlined above.  He  was monitored on telemetry overnight which demonstrated sinus rhythm with V pacing.  Left chest was without hematoma or ecchymosis.  The device was interrogated and found to be functioning normally.  CXR was obtained and demonstrated a small apical pneumothorax post device implantation.  He was observed an additional 24 hours during which he remained clinically stable.  Wound care, arm mobility, and  restrictions were reviewed with the patient.  Tthe patient was examined and considered stable for discharge to home.   At time of discharge, he was alert, ambulatory, and without complaint.  He denies SOB or CP and was on room air.  Physical Exam: Filed Vitals:   11/24/14 1345  BP: 135/44  Pulse: 61  Temp: 97.8 F (36.6 C)  Resp:     GEN- The patient is well appearing, alert and oriented x 3 today.   HEENT: normocephalic, atraumatic; sclera clear, conjunctiva pink; hearing intact; oropharynx clear; neck supple, no JVP Lymph- no cervical lymphadenopathy Lungs- Clear to ausculation bilaterally with mildly decreased BS at the L upper lung field, normal work of breathing.  No wheezes, rales, rhonchi Heart- Regular rate and rhythm, no murmurs, rubs or gallops, PMI not laterally displaced GI- soft, non-tender, non-distended, bowel sounds present, no hepatosplenomegaly Extremities- no clubbing, cyanosis, or edema; DP/PT/radial pulses 2+ bilaterally MS- no significant deformity or atrophy Skin- warm and dry, no rash or lesion, left chest without hematoma/ecchymosis Psych- euthymic mood, full affect Neuro- strength and sensation are intact   Discharge Medications:    Medication List    TAKE these medications        amLODipine-valsartan 5-160 MG per tablet  Commonly known as:  EXFORGE  Take 1 tablet by mouth 2 (two) times daily.     atorvastatin 40 MG tablet  Commonly known as:  LIPITOR  TAKE 1 TABLET (40 MG TOTAL) BY MOUTH DAILY.     CHLORPHENIRAMINE MALEATE PO  Take 4 mg by mouth every 6 (six) hours as  needed (for congestion). 3-4 tablets daily.     Coenzyme Q-10 100 MG capsule  Take 100 mg by mouth daily.     famotidine 20 MG tablet  Commonly known as:  PEPCID  Take 20 mg by mouth as needed for heartburn or indigestion.     Hydrocodone-Acetaminophen 5-300 MG Tabs  Take 1 tablet by mouth every 6 (six) hours as needed.     multivitamin tablet  Take 1 tablet by mouth 3  (three) times a week.     nitroGLYCERIN 0.4 MG SL tablet  Commonly known as:  NITROSTAT  Place 0.4 mg under the tongue every 5 (five) minutes as needed for chest pain.     Resveratrol 250 MG Caps  Take 1 capsule by mouth daily.     sildenafil 100 MG tablet  Commonly known as:  VIAGRA  Take 100 mg by mouth daily as needed for erectile dysfunction.         Disposition:  Discharge Instructions    Diet - low sodium heart healthy    Complete by:  As directed      Discharge instructions    Complete by:  As directed   Return for complaints of chest pain or shortness of breath No driving until follow-up with MD     Increase activity slowly    Complete by:  As directed            Follow-up Information    Follow up with [name], MD On 11/28/2014.   Specialty:  Cardiology   Why:  10 am   Contact information:   [place] [address] [phone]        Duration of Discharge Encounter: Greater than 30 minutes including physician time.  Army Fossa MD, [facility] 11/24/2014 3:44 PM

## 2014-11-24 NOTE — Progress Notes (Signed)
SUBJECTIVE: The patient is doing well today.  Has incisional site pain.  Denies SOB, CP,  or any new concerns.  Marland Kitchen amLODipine  5 mg Oral Daily  . atorvastatin  40 mg Oral q1800  . fluticasone  1 spray Each Nare Daily  . irbesartan  150 mg Oral Daily  . loratadine  10 mg Oral Daily  . multivitamin with minerals  1 tablet Oral Q M,W,F  . sodium chloride  3 mL Intravenous Q12H      OBJECTIVE: Physical Exam: Filed Vitals:   11/23/14 1200 11/23/14 1418 11/23/14 2210 11/24/14 0450  BP: 132/47 105/56 147/52 152/62  Pulse:  62 65 65  Temp: 97.7 F (36.5 C) 97.7 F (36.5 C) 98.6 F (37 C) 98.4 F (36.9 C)  TempSrc: Oral Oral Oral Oral  Resp: 16 16    Height:      Weight:      SpO2: 98% 97% 96% 99%    Intake/Output Summary (Last 24 hours) at 11/24/14 0759 Last data filed at 11/24/14 0000  Gross per 24 hour  Intake    600 ml  Output      0 ml  Net    600 ml    Telemetry reveals sinus rhythm with V pacing, likely PMT x 1 yesterday  GEN- The patient is well appearing, alert and oriented x 3 today.   Head- normocephalic, atraumatic Eyes-  Sclera clear, conjunctiva pink Ears- hearing intact Oropharynx- clear Neck- supple,   Lungs- Clear to ausculation bilaterally, decreased BS at L apex, normal work of breathing Heart- Regular rate and rhythm, no murmurs, rubs or gallops, PMI not laterally displaced GI- soft, NT, ND, + BS Extremities- no clubbing, cyanosis, or edema Skin- no rash or lesion, pacemaker pocket is without hematoma   LABS: Basic Metabolic Panel:  Recent Labs  11/21/14 2032 11/22/14 0305 11/22/14 0505 11/23/14 0200  NA 136  --  137 139  K 4.7  --  4.3 4.2  CL 103  --  106 108  CO2 26  --  23 26  GLUCOSE 98  --  102* 87  BUN 33*  --  28* 20  CREATININE 1.35  --  1.21 1.07  CALCIUM 9.0  --  8.6 8.4  MG 2.3 2.2  --   --    Liver Function Tests: No results for input(s): AST, ALT, ALKPHOS, BILITOT, PROT, ALBUMIN in the last 72 hours. No results for  input(s): LIPASE, AMYLASE in the last 72 hours. CBC:  Recent Labs  11/21/14 2032 11/22/14 0505  WBC 10.1 8.4  HGB 13.4 12.6*  HCT 39.0 36.4*  MCV 89.9 90.5  PLT 185 181   Cardiac Enzymes:  Recent Labs  11/22/14 0030 11/22/14 0505 11/22/14 1034  TROPONINI 0.08* 0.08* 0.06*   Thyroid Function Tests:  Recent Labs  11/22/14 0305  TSH 1.821   Anemia Panel: No results for input(s): VITAMINB12, FOLATE, FERRITIN, TIBC, IRON, RETICCTPCT in the last 72 hours.  RADIOLOGY: Dg Chest 2 View  11/24/2014   CLINICAL DATA:  Pacemaker placement 2 days ago. Followup pneumothorax.  EXAM: CHEST  2 VIEW  COMPARISON:  11/23/2014  FINDINGS: Left apical pneumothorax is larger now approximately 10-15% in size. No effusion.  Dual lead pacemaker in good position and unchanged with leads in the right atrium and right ventricle.  Negative for heart failure. Left coronary stent noted. Negative for pneumonia.  IMPRESSION: Progression of left apical pneumothorax.   Electronically Signed   By: Juanda Crumble  Carlis Abbott M.D.   On: 11/24/2014 07:46   Dg Chest 2 View  11/23/2014   CLINICAL DATA:  Status post placement of pacemaker. Left arm soreness. Initial encounter.  EXAM: CHEST  2 VIEW  COMPARISON:  Chest radiograph performed 11/22/2014  FINDINGS: The lungs are well-aerated. There appears to be a small left apical pneumothorax, measuring approximately 15% of left lung volume. This appears to have been present on the prior study.  Minimal bibasilar atelectasis is noted. No pleural effusion is seen.  The heart is normal in size; the pacemaker is noted overlying the left chest wall, with leads ending overlying the right atrium and right ventricle. No acute osseous abnormalities are seen.  IMPRESSION: Small left apical pneumothorax, measuring approximately 15% of left lung volume. Minimal bibasilar atelectasis noted.  These results were called by telephone at the time of interpretation on 11/23/2014 at 6:14 am to [place] RN on  [place], who verbally acknowledged these results.   Electronically Signed   By: Garald Balding M.D.   On: 11/23/2014 06:16   Dg Chest 2 View  11/22/2014   CLINICAL DATA:  81 year old male with possible pneumothorax on the left, in the cardiac catheterization lab. Initial encounter.  EXAM: CHEST  2 VIEW  COMPARISON:  11/21/2014.  FINDINGS: Portable AP supine view at 1443 hrs. Mildly radiolucent appearance of the left cardiophrenic sulcus, but no pleural edge identified on the left. No mediastinal shift. A left chest dual lead cardiac pacemaker has been placed. Leads course to the right atrium and RV apex regions. Stable cardiac size and mediastinal contours. Allowing for portable technique, a the lungs otherwise appear clear. Visualized tracheal air column is within normal limits.  IMPRESSION: 1. No definite pneumothorax on the left; mild increased lucency of the left costophrenic sulcus might indicate an anterior pneumothorax. Upright versus right side down lateral decubitus view of the chest would evaluate further. 2. Left chest dual lead cardiac pacemaker placed with otherwise no adverse features. Study discussed by telephone with Dr. [name] on 11/22/2014 at 1448 hr.   Electronically Signed   By: Genevie Ann M.D.   On: 11/22/2014 15:07   Dg Chest Port 1 View  11/21/2014   CLINICAL DATA:  Bradycardia.  Dizziness.  EXAM: PORTABLE CHEST - 1 VIEW  COMPARISON:  03/07/2014  FINDINGS: The heart is at the upper limits of normal in size with a left ventricular configuration. Pulmonary vasculature is normal. There is no consolidation, pleural effusion, or pneumothorax. No acute osseous abnormalities seen.  IMPRESSION: Heart at the upper limits of normal in size with left ventricular configuration. No acute pulmonary process.   Electronically Signed   By: Jeb Levering M.D.   On: 11/21/2014 21:33    ASSESSMENT AND PLAN:  Active Problems:   Complete heart block   RBBB   Coronary artery disease involving native  coronary artery of native heart without angina pectoris  1. Complete heart block Doing well s/p PPM Device interrogation is normal CXR reveals stable leads, very small apical ptx may be a little worse but still small, asymptomatic  2. htn Stable No change required today  3. CAD No ischemic symptoms  Observe today Possibly home this afternoon  [name], MD 11/24/2014 7:59 AM

## 2014-11-24 NOTE — Progress Notes (Signed)
Pt discharged to home via W/C, condition stable, accompanied by family.

## 2014-11-28 ENCOUNTER — Ambulatory Visit (INDEPENDENT_AMBULATORY_CARE_PROVIDER_SITE_OTHER): Payer: Medicare Other | Admitting: Internal Medicine

## 2014-11-28 ENCOUNTER — Encounter: Payer: Self-pay | Admitting: Internal Medicine

## 2014-11-28 VITALS — BP 156/74 | HR 70 | Ht 70.0 in | Wt 185.0 lb

## 2014-11-28 DIAGNOSIS — I442 Atrioventricular block, complete: Secondary | ICD-10-CM

## 2014-11-28 DIAGNOSIS — R001 Bradycardia, unspecified: Secondary | ICD-10-CM

## 2014-11-28 DIAGNOSIS — R0602 Shortness of breath: Secondary | ICD-10-CM

## 2014-11-28 LAB — MDC_IDC_ENUM_SESS_TYPE_INCLINIC
Battery Remaining Longevity: 138 mo
Battery Voltage: 3.01 V
Brady Statistic RA Percent Paced: 31 %
Brady Statistic RV Percent Paced: 99.48 %
Date Time Interrogation Session: 20160404134644
Implantable Pulse Generator Model: 2240
Implantable Pulse Generator Serial Number: 7732903
Lead Channel Impedance Value: 387.5 Ohm
Lead Channel Impedance Value: 700 Ohm
Lead Channel Pacing Threshold Amplitude: 0.5 V
Lead Channel Pacing Threshold Amplitude: 0.5 V
Lead Channel Pacing Threshold Pulse Width: 0.4 ms
Lead Channel Pacing Threshold Pulse Width: 0.4 ms
Lead Channel Sensing Intrinsic Amplitude: 12 mV
Lead Channel Sensing Intrinsic Amplitude: 5 mV
Lead Channel Setting Pacing Amplitude: 0.75 V
Lead Channel Setting Pacing Amplitude: 1.5 V
Lead Channel Setting Pacing Pulse Width: 0.4 ms
Lead Channel Setting Sensing Sensitivity: 4 mV

## 2014-11-28 NOTE — Progress Notes (Signed)
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  Since his recent PPM implant, he has done well.  He has mild discomfort at night when trying to sleep.  He denies SOB.  Today, he denies symptoms of palpitations, exertional chest pain,  lower extremity edema, dizziness, presyncope, or syncope.  The patient is otherwise without complaint today.   Past Medical History  Diagnosis Date  . Heart attack 2001  . Prostate cancer 2008  . Pain, foot, chronic   . Esophageal reflux   . Cough   . Allergic rhinitis   . Pes planus   . Metatarsalgia   . Diverticula of colon   . ASCVD (arteriosclerotic cardiovascular disease)     single vessel  . Essential hypertension, benign   . History of atrial fibrillation     remote history of  . RLS (restless legs syndrome)   . ED (erectile dysfunction)   . H/O laryngeal cancer     superficial  . Coronary atherosclerosis of native coronary artery   . Mitral valve disorders   . Pure hypercholesterolemia   . Old myocardial infarction   . Dysrhythmia    Past Surgical History  Procedure Laterality Date  . Foot arthrodesis    . Esternal beam xrt and seeds for prostate cancer    . Colonoscopy w/ polypectomy    . Cardiac catheterization    . Permanent pacemaker insertion N/A 11/22/2014    Procedure: PERMANENT PACEMAKER INSERTION;  Surgeon: [name], MD;  Location: [facility] C[facility] LAB;  Service: Cardiovascular;  Laterality: N/A;    ROS- all systems are reviewed and negative except as per HPI above  Current Outpatient Prescriptions  Medication Sig Dispense Refill  . amLODipine-valsartan (EXFORGE) 5-160 MG per tablet Take 1 tablet by mouth 2 (two) times daily. 60 tablet 6  . atorvastatin (LIPITOR) 40 MG tablet TAKE 1 TABLET (40 MG TOTAL) BY MOUTH DAILY. 90 tablet 1  . CHLORPHENIRAMINE MALEATE PO Take 4 mg by mouth every 6 (six) hours as needed (for congestion).  3-4 tablets daily.    . Coenzyme Q-10 100 MG capsule Take 100 mg by mouth daily.     . famotidine (PEPCID) 20 MG tablet Take 20 mg by mouth daily as needed for heartburn or indigestion.     . Hydrocodone-Acetaminophen 5-300 MG TABS Take 1 tablet by mouth every 6 (six) hours as needed. (Patient taking differently: Take 1 tablet by mouth every 6 (six) hours as needed (PAIN). ) 20 each 0  . Multiple Vitamin (MULTIVITAMIN) tablet Take 1 tablet by mouth 3 (three) times a week.     . nitroGLYCERIN (NITROSTAT) 0.4 MG SL tablet Place 0.4 mg under the tongue every 5 (five) minutes as needed for chest pain (MAX 3 TABLETS).     . Resveratrol 250 MG CAPS Take 1 capsule by mouth daily.    . sildenafil (VIAGRA) 100 MG tablet Take 100 mg by mouth daily as needed for erectile dysfunction.     . prednisoLONE acetate (PRED FORTE) 1 % ophthalmic suspension HASN'T STARTED YET     No current facility-administered medications for this visit.    Physical Exam: Filed Vitals:   11/28/14 1026  BP: 156/74  Pulse: 70  Height: 5\' 10"  (1.778 m)  Weight: 185 lb (83.915 kg)    GEN- The patient is well appearing, alert and oriented x 3 today.   Head- normocephalic, atraumatic  Eyes-  Sclera clear, conjunctiva pink Ears- hearing intact Oropharynx- clear Lungs- Clear to ausculation bilaterally, normal work of breathing Chest- pacemaker pocket has small ecchymosis, no hematoma Heart- Regular rate and rhythm, no murmurs, rubs or gallops, PMI not laterally displaced GI- soft, NT, ND, + BS Extremities- no clubbing, cyanosis, or edema  Pacemaker interrogation- reviewed in detail today,  See PACEART report  Assessment and Plan:  1. Complete heart block Normal pacemaker function See Claudia Desanctis Art report No changes today CXR on Friday to follow-up small apical pneumothorax--> clinically is doing well  Return for routine wound check on Friday  Follow-up with me in 3 weeks

## 2014-11-28 NOTE — Patient Instructions (Signed)
Your physician recommends that you schedule a follow-up appointment in: 12/02/14 at 9:30 for wound check  A chest x-ray takes a picture of the organs and structures inside the chest, including the heart, lungs, and blood vessels. This test can show several things, including, whether the heart is enlarges; whether fluid is building up in the lungs; and whether pacemaker / defibrillator leads are still in place.---at the [place] office across from [place]

## 2014-12-02 ENCOUNTER — Ambulatory Visit (INDEPENDENT_AMBULATORY_CARE_PROVIDER_SITE_OTHER)
Admission: RE | Admit: 2014-12-02 | Discharge: 2014-12-02 | Disposition: A | Discharge status: Home / Self Care | Payer: Medicare Other | Source: Ambulatory Visit | Attending: Internal Medicine | Admitting: Internal Medicine

## 2014-12-02 ENCOUNTER — Ambulatory Visit (INDEPENDENT_AMBULATORY_CARE_PROVIDER_SITE_OTHER): Payer: Medicare Other | Admitting: *Deleted

## 2014-12-02 DIAGNOSIS — R0602 Shortness of breath: Secondary | ICD-10-CM | POA: Diagnosis not present

## 2014-12-02 DIAGNOSIS — R001 Bradycardia, unspecified: Secondary | ICD-10-CM

## 2014-12-02 DIAGNOSIS — Z950 Presence of cardiac pacemaker: Secondary | ICD-10-CM | POA: Diagnosis not present

## 2014-12-02 DIAGNOSIS — I442 Atrioventricular block, complete: Secondary | ICD-10-CM | POA: Diagnosis not present

## 2014-12-02 DIAGNOSIS — I517 Cardiomegaly: Secondary | ICD-10-CM | POA: Diagnosis not present

## 2014-12-02 LAB — MDC_IDC_ENUM_SESS_TYPE_INCLINIC
Battery Remaining Longevity: 134.4 mo
Battery Voltage: 3.01 V
Brady Statistic RA Percent Paced: 45 %
Brady Statistic RV Percent Paced: 99.22 %
Date Time Interrogation Session: 20160408135208
Implantable Pulse Generator Model: 2240
Implantable Pulse Generator Serial Number: 7732903
Lead Channel Impedance Value: 400 Ohm
Lead Channel Impedance Value: 750 Ohm
Lead Channel Pacing Threshold Amplitude: 0.375 V
Lead Channel Pacing Threshold Amplitude: 0.5 V
Lead Channel Pacing Threshold Pulse Width: 0.4 ms
Lead Channel Pacing Threshold Pulse Width: 0.4 ms
Lead Channel Sensing Intrinsic Amplitude: 12 mV
Lead Channel Sensing Intrinsic Amplitude: 2.1 mV
Lead Channel Setting Pacing Amplitude: 0.75 V
Lead Channel Setting Pacing Amplitude: 1.375
Lead Channel Setting Pacing Pulse Width: 0.4 ms
Lead Channel Setting Sensing Sensitivity: 4 mV

## 2014-12-02 NOTE — Progress Notes (Signed)
Wound check appointment. Steri-strips removed. Wound without redness or edema. Incision edges approximated, wound well healed. Normal device function. Thresholds, sensing, and impedances consistent with implant measurements. Device auto capture programmed on for extra safety margin. Histogram distribution appropriate for patient and level of activity. No mode switches or high ventricular rates noted. 19 "PMT" episodes recorded---VA 268ms/PVARP 338ms---no changes. Patient educated about wound care, arm mobility, lifting restrictions. ROV in 3 months with JA.

## 2014-12-06 ENCOUNTER — Telehealth: Payer: Self-pay | Admitting: *Deleted

## 2014-12-06 NOTE — Telephone Encounter (Signed)
Called pt to inform him of resolved pneumothorax. Pt appreciates call. He has a question about scheduling his cataracts surgery since device placement. Informed that a response from Dr. [name] was pending. Pt verbalizes understanding.

## 2014-12-06 NOTE — Telephone Encounter (Signed)
OK to proceed with cataract surgery. Please inform patient

## 2014-12-08 NOTE — Telephone Encounter (Signed)
Spoke with patient and let him know okay to proceed  He is due a follow up also

## 2014-12-13 DIAGNOSIS — F4320 Adjustment disorder, unspecified: Secondary | ICD-10-CM | POA: Diagnosis not present

## 2014-12-14 ENCOUNTER — Encounter: Payer: Self-pay | Admitting: Internal Medicine

## 2014-12-19 ENCOUNTER — Encounter: Payer: Self-pay | Admitting: Internal Medicine

## 2014-12-19 ENCOUNTER — Ambulatory Visit (INDEPENDENT_AMBULATORY_CARE_PROVIDER_SITE_OTHER): Payer: Medicare Other | Admitting: Internal Medicine

## 2014-12-19 VITALS — BP 140/66 | HR 65 | Ht 69.5 in | Wt 185.2 lb

## 2014-12-19 DIAGNOSIS — I2510 Atherosclerotic heart disease of native coronary artery without angina pectoris: Secondary | ICD-10-CM | POA: Diagnosis not present

## 2014-12-19 DIAGNOSIS — I442 Atrioventricular block, complete: Secondary | ICD-10-CM | POA: Diagnosis not present

## 2014-12-19 LAB — MDC_IDC_ENUM_SESS_TYPE_INCLINIC
Battery Remaining Longevity: 138 mo
Battery Voltage: 3.04 V
Brady Statistic RA Percent Paced: 18 %
Brady Statistic RV Percent Paced: 99.09 %
Date Time Interrogation Session: 20160425160333
Implantable Pulse Generator Model: 2240
Implantable Pulse Generator Serial Number: 7732903
Lead Channel Impedance Value: 362.5 Ohm
Lead Channel Impedance Value: 712.5 Ohm
Lead Channel Pacing Threshold Amplitude: 0.375 V
Lead Channel Pacing Threshold Amplitude: 0.5 V
Lead Channel Pacing Threshold Pulse Width: 0.4 ms
Lead Channel Pacing Threshold Pulse Width: 0.4 ms
Lead Channel Sensing Intrinsic Amplitude: 12 mV
Lead Channel Sensing Intrinsic Amplitude: 2 mV
Lead Channel Setting Pacing Amplitude: 0.75 V
Lead Channel Setting Pacing Amplitude: 1.375
Lead Channel Setting Pacing Pulse Width: 0.4 ms
Lead Channel Setting Sensing Sensitivity: 4 mV

## 2014-12-19 NOTE — Patient Instructions (Signed)
Medication Instructions:  Your physician recommends that you continue on your current medications as directed. Please refer to the Current Medication list given to you today.   Labwork: None ordered  Testing/Procedures: None ordered  Follow-Up: Your physician recommends that you schedule a follow-up appointment in: 3 months with Dr [name] (beginning of July)    Any Other Special Instructions Will Be Listed Below (If Applicable).

## 2014-12-19 NOTE — Progress Notes (Signed)
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  He has done very well.  Back to baseline health state. Today, he denies symptoms of palpitations, SOB, chest pain,  lower extremity edema, dizziness, presyncope, or syncope.  The patient is otherwise without complaint today.   Past Medical History  Diagnosis Date  . Heart attack 2001  . Prostate cancer 2008  . Pain, foot, chronic   . Esophageal reflux   . Cough   . Allergic rhinitis   . Pes planus   . Metatarsalgia   . Diverticula of colon   . ASCVD (arteriosclerotic cardiovascular disease)     single vessel  . Essential hypertension, benign   . History of atrial fibrillation     remote history of  . RLS (restless legs syndrome)   . ED (erectile dysfunction)   . H/O laryngeal cancer     superficial  . Coronary atherosclerosis of native coronary artery   . Mitral valve disorders   . Pure hypercholesterolemia   . Old myocardial infarction   . Dysrhythmia    Past Surgical History  Procedure Laterality Date  . Foot arthrodesis    . Esternal beam xrt and seeds for prostate cancer    . Colonoscopy w/ polypectomy    . Cardiac catheterization    . Permanent pacemaker insertion N/A 11/22/2014    Procedure: PERMANENT PACEMAKER INSERTION;  Surgeon: [name], MD;  Location: [facility] CATH LAB;  Service: Cardiovascular;  Laterality: N/A;    ROS- all systems are reviewed and negative except as per HPI above  Current Outpatient Prescriptions  Medication Sig Dispense Refill  . amLODipine-valsartan (EXFORGE) 5-160 MG per tablet Take 1 tablet by mouth 2 (two) times daily. 60 tablet 6  . atorvastatin (LIPITOR) 40 MG tablet TAKE 1 TABLET (40 MG TOTAL) BY MOUTH DAILY. 90 tablet 1  . CHLORPHENIRAMINE MALEATE PO Take 4 mg by mouth every 6 (six) hours as needed (for congestion). 3-4 tablets daily.    . Coenzyme Q-10 100 MG capsule Take 100 mg by  mouth daily.     . famotidine (PEPCID) 20 MG tablet Take 20 mg by mouth daily as needed for heartburn or indigestion.     . Hydrocodone-Acetaminophen 5-300 MG TABS Take 1 tablet by mouth every 6 (six) hours as needed. (Patient taking differently: Take 1 tablet by mouth every 6 (six) hours as needed (PAIN). ) 20 each 0  . Multiple Vitamin (MULTIVITAMIN) tablet Take 1 tablet by mouth 3 (three) times a week.     . nitroGLYCERIN (NITROSTAT) 0.4 MG SL tablet Place 0.4 mg under the tongue every 5 (five) minutes as needed for chest pain (MAX 3 TABLETS).     . prednisoLONE acetate (PRED FORTE) 1 % ophthalmic suspension HASN'T STARTED YET    . Resveratrol 250 MG CAPS Take 1 capsule by mouth daily.    . sildenafil (VIAGRA) 100 MG tablet Take 100 mg by mouth daily as needed for erectile dysfunction.      No current facility-administered medications for this visit.    Physical Exam: Filed Vitals:   12/19/14 1345  BP: 140/66  Pulse: 65  Height: 5' 9.5" (1.765 m)  Weight: 185 lb 3.2 oz (84.006 kg)  SpO2: 97%    GEN- The patient is well appearing, alert and oriented x 3 today.   Head- normocephalic, atraumatic Eyes-  Sclera clear, conjunctiva pink Ears- hearing intact  Oropharynx- clear Lungs- Clear to ausculation bilaterally, normal work of breathing Chest- pacemaker pocket has small ecchymosis, no hematoma Heart- Regular rate and rhythm, no murmurs, rubs or gallops, PMI not laterally displaced GI- soft, NT, ND, + BS Extremities- no clubbing, cyanosis, or edema  Pacemaker interrogation- reviewed in detail today,  See PACEART report  Assessment and Plan:  1. Complete heart block Normal pacemaker function See Pace Art report No changes today  Return to see me for 90 day check Resume all regular activity Merlin remote monitoring  [name] MD, [facility] 12/19/2014 2:02 PM

## 2014-12-20 DIAGNOSIS — H2512 Age-related nuclear cataract, left eye: Secondary | ICD-10-CM | POA: Diagnosis not present

## 2014-12-20 DIAGNOSIS — H25812 Combined forms of age-related cataract, left eye: Secondary | ICD-10-CM | POA: Diagnosis not present

## 2014-12-20 DIAGNOSIS — H25042 Posterior subcapsular polar age-related cataract, left eye: Secondary | ICD-10-CM | POA: Diagnosis not present

## 2014-12-25 HISTORY — PX: CATARACT EXTRACTION W/ INTRAOCULAR LENS IMPLANT: SHX1309

## 2014-12-28 DIAGNOSIS — F4320 Adjustment disorder, unspecified: Secondary | ICD-10-CM | POA: Diagnosis not present

## 2015-01-04 DIAGNOSIS — F4320 Adjustment disorder, unspecified: Secondary | ICD-10-CM | POA: Diagnosis not present

## 2015-01-09 ENCOUNTER — Telehealth: Payer: Self-pay | Admitting: Internal Medicine

## 2015-01-09 NOTE — Telephone Encounter (Signed)
Patient states he was bending down and felt light-headed upon standing. Says he still feels "a bit spacey". BP 140/72, HR 73.  HR yesterday while riding bike he says was up to 145 ("maybe?"). States his heart rate feels regular and strong today. He has had no recent med changes or OTC meds. He does have allergies but doesn't feel the lightheadedness has to do with that though. Patient advised to go to his Merlin machine and press the large white button twice in order to sumbit a remote device check. Patient did. Advised him that we would call him back within half an hour after we get the transmission and get it interpreted. Patient verbalized appreciation and understanding.

## 2015-01-09 NOTE — Telephone Encounter (Signed)
Remote transmission received and reviewed with Dr.Allred. 3 AMS episodes---AT per EGMs, LAST 5/18. Freeze capture shows Sinus w/PACs. No changes per Dr.Allred.   Left message w/pt to call back for results. sss

## 2015-01-09 NOTE — Telephone Encounter (Signed)
Informed patient that his remote transmission was received and reviewed by JA. Normal pacemaker function. No changes were recommended. Patient voiced understanding. Patient also asked for clarification on his MTR of 130bpm. I explained to his that the ppm would not pace him faster than that rate. Again, patient voiced understanding.

## 2015-01-09 NOTE — Telephone Encounter (Signed)
New problem    Pt has a pacer implant 3.29.16 and he is now having lightheadedness, pt would like to speak to nurse to possible come in today.

## 2015-01-09 NOTE — Telephone Encounter (Signed)
F/u ° ° °Pt returning your call °

## 2015-01-09 NOTE — Telephone Encounter (Signed)
Please see note below. Message was left for patient to call back for results from Remote Transmission.

## 2015-01-11 DIAGNOSIS — F4320 Adjustment disorder, unspecified: Secondary | ICD-10-CM | POA: Diagnosis not present

## 2015-01-20 DIAGNOSIS — F4320 Adjustment disorder, unspecified: Secondary | ICD-10-CM | POA: Diagnosis not present

## 2015-01-25 DIAGNOSIS — L570 Actinic keratosis: Secondary | ICD-10-CM | POA: Diagnosis not present

## 2015-01-25 DIAGNOSIS — L821 Other seborrheic keratosis: Secondary | ICD-10-CM | POA: Diagnosis not present

## 2015-01-25 DIAGNOSIS — D485 Neoplasm of uncertain behavior of skin: Secondary | ICD-10-CM | POA: Diagnosis not present

## 2015-01-25 DIAGNOSIS — L281 Prurigo nodularis: Secondary | ICD-10-CM | POA: Diagnosis not present

## 2015-01-25 DIAGNOSIS — Z85828 Personal history of other malignant neoplasm of skin: Secondary | ICD-10-CM | POA: Diagnosis not present

## 2015-01-30 ENCOUNTER — Encounter: Payer: Self-pay | Admitting: Internal Medicine

## 2015-02-06 ENCOUNTER — Telehealth: Payer: Self-pay | Admitting: Cardiology

## 2015-02-06 MED ORDER — VALSARTAN 160 MG PO TABS: 160.0000 mg | ORAL_TABLET | Freq: Every day | ORAL | Status: DC

## 2015-02-06 NOTE — Telephone Encounter (Signed)
Spoke with pt and he states that he has been having some swelling in bilateral ankles and feet that started about a week ago. Pt believes the Amlodipine/Valsartan medication could be causing this. Pt states BP has been 130-140/60's and HR 60-70. Pt denies SOB, CP, dizziness, lightheadedness and fatigue. Informed pt that I would route this information to Dr. Marlou Porch for review and advisement.

## 2015-02-06 NOTE — Telephone Encounter (Signed)
DC Exforge (amlodipine likely causing edema)  Start diovan 160mg  PO QD  Candee Furbish, MD

## 2015-02-06 NOTE — Telephone Encounter (Signed)
Called patient about medication changes. Will send prescription to patient's pharmacy.

## 2015-02-06 NOTE — Telephone Encounter (Signed)
New Message  Pt having reaction to medication- could not recall which med. Please call back and discuss.

## 2015-02-08 DIAGNOSIS — F4320 Adjustment disorder, unspecified: Secondary | ICD-10-CM | POA: Diagnosis not present

## 2015-02-14 DIAGNOSIS — I4891 Unspecified atrial fibrillation: Secondary | ICD-10-CM | POA: Diagnosis not present

## 2015-02-14 DIAGNOSIS — Z1389 Encounter for screening for other disorder: Secondary | ICD-10-CM | POA: Diagnosis not present

## 2015-02-14 DIAGNOSIS — I10 Essential (primary) hypertension: Secondary | ICD-10-CM | POA: Diagnosis not present

## 2015-02-16 DIAGNOSIS — F4320 Adjustment disorder, unspecified: Secondary | ICD-10-CM | POA: Diagnosis not present

## 2015-02-20 ENCOUNTER — Other Ambulatory Visit: Payer: Self-pay | Admitting: Cardiology

## 2015-02-22 DIAGNOSIS — Z8546 Personal history of malignant neoplasm of prostate: Secondary | ICD-10-CM | POA: Diagnosis not present

## 2015-02-22 DIAGNOSIS — F4320 Adjustment disorder, unspecified: Secondary | ICD-10-CM | POA: Diagnosis not present

## 2015-02-23 DIAGNOSIS — M25571 Pain in right ankle and joints of right foot: Secondary | ICD-10-CM | POA: Diagnosis not present

## 2015-02-28 ENCOUNTER — Other Ambulatory Visit: Payer: Self-pay

## 2015-03-01 ENCOUNTER — Ambulatory Visit (INDEPENDENT_AMBULATORY_CARE_PROVIDER_SITE_OTHER): Payer: Medicare Other | Admitting: Internal Medicine

## 2015-03-01 ENCOUNTER — Encounter: Payer: Self-pay | Admitting: Internal Medicine

## 2015-03-01 VITALS — BP 130/80 | HR 62 | Ht 69.5 in | Wt 180.0 lb

## 2015-03-01 DIAGNOSIS — I471 Supraventricular tachycardia: Secondary | ICD-10-CM

## 2015-03-01 DIAGNOSIS — I10 Essential (primary) hypertension: Secondary | ICD-10-CM | POA: Diagnosis not present

## 2015-03-01 DIAGNOSIS — R001 Bradycardia, unspecified: Secondary | ICD-10-CM

## 2015-03-01 DIAGNOSIS — I2510 Atherosclerotic heart disease of native coronary artery without angina pectoris: Secondary | ICD-10-CM

## 2015-03-01 DIAGNOSIS — I498 Other specified cardiac arrhythmias: Secondary | ICD-10-CM

## 2015-03-01 DIAGNOSIS — I442 Atrioventricular block, complete: Secondary | ICD-10-CM

## 2015-03-01 LAB — CUP PACEART INCLINIC DEVICE CHECK
Battery Remaining Longevity: 133.2 mo
Battery Voltage: 3.02 V
Brady Statistic RA Percent Paced: 23 %
Brady Statistic RV Percent Paced: 98 %
Date Time Interrogation Session: 20160706102000
Lead Channel Impedance Value: 437.5 Ohm
Lead Channel Impedance Value: 675 Ohm
Lead Channel Pacing Threshold Amplitude: 0.5 V
Lead Channel Pacing Threshold Amplitude: 0.625 V
Lead Channel Pacing Threshold Pulse Width: 0.4 ms
Lead Channel Pacing Threshold Pulse Width: 0.4 ms
Lead Channel Sensing Intrinsic Amplitude: 1.6 mV
Lead Channel Sensing Intrinsic Amplitude: 12 mV
Lead Channel Setting Pacing Amplitude: 0.75 V
Lead Channel Setting Pacing Amplitude: 1.625
Lead Channel Setting Pacing Pulse Width: 0.4 ms
Lead Channel Setting Sensing Sensitivity: 4 mV
Pulse Gen Model: 2240
Pulse Gen Serial Number: 7732903

## 2015-03-01 NOTE — Patient Instructions (Addendum)
Medication Instructions:  No changes were made to your medications today.  Labwork: N/A  Testing/Procedures: N/A  Follow-Up: - Remote monitoring is used to monitor your Pacemaker of ICD from home. This monitoring reduces the number of office visits required to check your device to one time per year. It allows [place] to keep an eye on the functioning of your device to ensure it is working properly. You are scheduled for a device check from home on 05/31/15. You may send your transmission at any time that day. If you have a wireless device, the transmission will be sent automatically. After your physician reviews your transmission, you will receive a postcard with your next transmission date.  - Your physician wants you to follow-up in: 1 year with Chanetta Marshall, NP for Dr. [name]. You will receive a reminder letter in the mail two months in advance. If you don't receive a letter, please call our office to schedule the follow-up appointment.  Any Other Special Instructions Will Be Listed Below (If Applicable).

## 2015-03-01 NOTE — Progress Notes (Signed)
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  He has done very well.  Back to baseline health state. Today, he denies symptoms of palpitations, SOB, chest pain,  lower extremity edema, dizziness, presyncope, or syncope.  The patient is otherwise without complaint today.   Past Medical History  Diagnosis Date  . Heart attack 2001  . Prostate cancer 2008  . Pain, foot, chronic   . Esophageal reflux   . Cough   . Allergic rhinitis   . Pes planus   . Metatarsalgia   . Diverticula of colon   . ASCVD (arteriosclerotic cardiovascular disease)     single vessel  . Essential hypertension, benign   . History of atrial fibrillation     remote history of  . RLS (restless legs syndrome)   . ED (erectile dysfunction)   . H/O laryngeal cancer     superficial  . Coronary atherosclerosis of native coronary artery   . Mitral valve disorders   . Pure hypercholesterolemia   . Old myocardial infarction   . Dysrhythmia    Past Surgical History  Procedure Laterality Date  . Foot arthrodesis    . Esternal beam xrt and seeds for prostate cancer    . Colonoscopy w/ polypectomy    . Cardiac catheterization    . Permanent pacemaker insertion N/A 11/22/2014    Procedure: PERMANENT PACEMAKER INSERTION;  Surgeon: [name], MD;  Location: [facility] CATH LAB;  Service: Cardiovascular;  Laterality: N/A;    ROS- all systems are reviewed and negative except as per HPI above  Current Outpatient Prescriptions  Medication Sig Dispense Refill  . atorvastatin (LIPITOR) 40 MG tablet TAKE 1 TABLET (40 MG TOTAL) BY MOUTH DAILY. 90 tablet 0  . CHLORPHENIRAMINE MALEATE PO Take 4 mg by mouth every 6 (six) hours as needed (for congestion). 3-4 tablets daily.    . Coenzyme Q-10 100 MG capsule Take 100 mg by mouth daily.     . famotidine (PEPCID) 20 MG tablet Take 20 mg by mouth daily as needed for heartburn or  indigestion.     . Hydrocodone-Acetaminophen 5-300 MG TABS Take 1 tablet by mouth every 6 (six) hours as needed. (Patient taking differently: Take 1 tablet by mouth every 6 (six) hours as needed (PAIN). ) 20 each 0  . Multiple Vitamin (MULTIVITAMIN) tablet Take 1 tablet by mouth 3 (three) times a week.     . nitroGLYCERIN (NITROSTAT) 0.4 MG SL tablet Place 0.4 mg under the tongue every 5 (five) minutes as needed for chest pain (MAX 3 TABLETS).     . prednisoLONE acetate (PRED FORTE) 1 % ophthalmic suspension HASN'T STARTED YET    . Resveratrol 250 MG CAPS Take 1 capsule by mouth daily.    . sildenafil (VIAGRA) 100 MG tablet Take 100 mg by mouth daily as needed for erectile dysfunction.     . valsartan (DIOVAN) 160 MG tablet Take 1 tablet (160 mg total) by mouth daily. 30 tablet 11   No current facility-administered medications for this visit.    Physical Exam: Filed Vitals:   03/01/15 0920  BP: 130/80  Pulse: 62  Height: 5' 9.5" (1.765 m)  Weight: 81.647 kg (180 lb)    GEN- The patient is well appearing, alert and oriented x 3 today.   Head- normocephalic, atraumatic Eyes-  Sclera clear, conjunctiva pink Ears- hearing intact Oropharynx- clear Lungs- Clear to ausculation  bilaterally, normal work of breathing Chest- pacemaker pocket has small ecchymosis, no hematoma Heart- Regular rate and rhythm, no murmurs, rubs or gallops, PMI not laterally displaced GI- soft, NT, ND, + BS Extremities- no clubbing, cyanosis, or edema  Pacemaker interrogation- reviewed in detail today,  See PACEART report  Assessment and Plan:  1. Complete heart block Normal pacemaker function See Pace Art report No changes today  2. Nonsustained atach/ afib Asymptomatic Episodes < 60 seconds Consider anticoagulation if episodes increase  3. HTN Above goal I would advise initiation of metoprolol succinate given frequent runs of nonsustained atach/ afib He will consider this and discuss further when he  sees Dr Marlou Porch again  [facility] remote monitoring Follow-up with Dr Marlou Porch as scheduled Return to see EP NP in 1 year  [name] MD, [facility] 03/01/2015 2:11 PM

## 2015-03-02 DIAGNOSIS — Z8546 Personal history of malignant neoplasm of prostate: Secondary | ICD-10-CM | POA: Diagnosis not present

## 2015-03-03 ENCOUNTER — Other Ambulatory Visit: Payer: Self-pay | Admitting: *Deleted

## 2015-03-03 MED ORDER — VALSARTAN 160 MG PO TABS: 160.0000 mg | ORAL_TABLET | Freq: Two times a day (BID) | ORAL | Status: DC

## 2015-03-08 DIAGNOSIS — F4320 Adjustment disorder, unspecified: Secondary | ICD-10-CM | POA: Diagnosis not present

## 2015-03-22 ENCOUNTER — Other Ambulatory Visit: Payer: Self-pay

## 2015-03-22 MED ORDER — VALSARTAN 160 MG PO TABS: 160.0000 mg | ORAL_TABLET | Freq: Two times a day (BID) | ORAL | Status: DC

## 2015-03-30 DIAGNOSIS — G4700 Insomnia, unspecified: Secondary | ICD-10-CM | POA: Diagnosis not present

## 2015-04-19 DIAGNOSIS — H11822 Conjunctivochalasis, left eye: Secondary | ICD-10-CM | POA: Diagnosis not present

## 2015-04-19 DIAGNOSIS — H11821 Conjunctivochalasis, right eye: Secondary | ICD-10-CM | POA: Diagnosis not present

## 2015-04-19 DIAGNOSIS — H02839 Dermatochalasis of unspecified eye, unspecified eyelid: Secondary | ICD-10-CM | POA: Diagnosis not present

## 2015-04-19 DIAGNOSIS — H16221 Keratoconjunctivitis sicca, not specified as Sjogren's, right eye: Secondary | ICD-10-CM | POA: Diagnosis not present

## 2015-04-25 DIAGNOSIS — G4700 Insomnia, unspecified: Secondary | ICD-10-CM | POA: Diagnosis not present

## 2015-05-02 ENCOUNTER — Telehealth: Payer: Self-pay | Admitting: Cardiology

## 2015-05-02 NOTE — Telephone Encounter (Signed)
Home phone number rings fast busy, LMTCB on mobile phone number listed.

## 2015-05-02 NOTE — Telephone Encounter (Signed)
Pt states when he was walking earlier this afternoon up a hill he developed fullness in his chest near his pacemaker site.  Pt states he stopped walking, chest fullness resolved.  Pt denies any other symptoms except usual shortness of breath walking up a hill. Pt states he exercised yesterday without symptoms. Pt denies any symptoms now and denies any symptoms since walk earlier today. Pt states he has not had any symptoms of chest fullness recently, chest fullness earlier today may be same as at time of heart attack on 2001.  I reviewed with Dr Pollie Friar Dr Acie Fredrickson:  Schedule appt with Flex PA 05/03/15, if any recurrent symptoms at all pt should report to ED tonight, avoid physical exertion.    Pt advised, verbalized understanding.

## 2015-05-02 NOTE — Telephone Encounter (Signed)
New Message  Pt c/o of Chest Pain: 1. Are you having CP right now? No. Just when he was taking the walk  2. Are you experiencing any other symptoms (ex. SOB, nausea, vomiting, sweating)? No  3. How long have you been experiencing CP? Just that one time around 2:30pm  4. Is your CP continuous or coming and going? Once  5. Have you taken Nitroglycerin? No. It wasn't that bad.   Comments: Chest pain slightly. Pt states that he has a pacer and he is not sure whether to call skains or Dr. Lamount Cohen

## 2015-05-03 ENCOUNTER — Encounter: Payer: Self-pay | Admitting: Nurse Practitioner

## 2015-05-03 ENCOUNTER — Ambulatory Visit (INDEPENDENT_AMBULATORY_CARE_PROVIDER_SITE_OTHER): Payer: Medicare Other | Admitting: Nurse Practitioner

## 2015-05-03 VITALS — BP 150/62 | HR 70 | Ht 69.5 in | Wt 186.6 lb

## 2015-05-03 DIAGNOSIS — I10 Essential (primary) hypertension: Secondary | ICD-10-CM

## 2015-05-03 DIAGNOSIS — I340 Nonrheumatic mitral (valve) insufficiency: Secondary | ICD-10-CM | POA: Insufficient documentation

## 2015-05-03 DIAGNOSIS — I200 Unstable angina: Secondary | ICD-10-CM | POA: Diagnosis not present

## 2015-05-03 DIAGNOSIS — Z8719 Personal history of other diseases of the digestive system: Secondary | ICD-10-CM | POA: Diagnosis not present

## 2015-05-03 DIAGNOSIS — E785 Hyperlipidemia, unspecified: Secondary | ICD-10-CM | POA: Diagnosis not present

## 2015-05-03 DIAGNOSIS — R079 Chest pain, unspecified: Secondary | ICD-10-CM

## 2015-05-03 LAB — CBC WITH DIFFERENTIAL/PLATELET
Basophils Absolute: 0.1 10*3/uL (ref 0.0–0.1)
Basophils Relative: 0.7 % (ref 0.0–3.0)
Eosinophils Absolute: 0.4 10*3/uL (ref 0.0–0.7)
Eosinophils Relative: 4.5 % (ref 0.0–5.0)
HCT: 42.2 % (ref 39.0–52.0)
Hemoglobin: 14.2 g/dL (ref 13.0–17.0)
Lymphocytes Relative: 23.7 % (ref 12.0–46.0)
Lymphs Abs: 2.2 10*3/uL (ref 0.7–4.0)
MCHC: 33.7 g/dL (ref 30.0–36.0)
MCV: 93.2 fl (ref 78.0–100.0)
Monocytes Absolute: 0.8 10*3/uL (ref 0.1–1.0)
Monocytes Relative: 8.4 % (ref 3.0–12.0)
Neutro Abs: 5.9 10*3/uL (ref 1.4–7.7)
Neutrophils Relative %: 62.7 % (ref 43.0–77.0)
Platelets: 197 10*3/uL (ref 150.0–400.0)
RBC: 4.53 Mil/uL (ref 4.22–5.81)
RDW: 15.7 % — ABNORMAL HIGH (ref 11.5–15.5)
WBC: 9.4 10*3/uL (ref 4.0–10.5)

## 2015-05-03 LAB — BASIC METABOLIC PANEL
BUN: 20 mg/dL (ref 6–23)
CO2: 28 mEq/L (ref 19–32)
Calcium: 9.1 mg/dL (ref 8.4–10.5)
Chloride: 104 mEq/L (ref 96–112)
Creatinine, Ser: 0.98 mg/dL (ref 0.40–1.50)
GFR: 77.84 mL/min (ref >60.00)
Glucose, Bld: 129 mg/dL — ABNORMAL HIGH (ref 70–99)
Potassium: 4 mEq/L (ref 3.5–5.1)
Sodium: 140 mEq/L (ref 135–145)

## 2015-05-03 LAB — PROTIME-INR
INR: 1 ratio (ref 0.8–1.0)
Prothrombin Time: 10.8 s (ref 9.6–13.1)

## 2015-05-03 MED ORDER — CARVEDILOL 3.125 MG PO TABS: 3.1250 mg | ORAL_TABLET | Freq: Two times a day (BID) | ORAL | Status: DC

## 2015-05-03 NOTE — Patient Instructions (Signed)
Medication Instructions:  START Carvedilol to 3.125mg  twice daily. An Rx has been sent to your pharmacy  Labwork: Bmet, Cbc, Pt/Inr today  Testing/Procedures: Your physician has requested that you have a cardiac catheterization. Cardiac catheterization is used to diagnose and/or treat various heart conditions. Doctors may recommend this procedure for a number of different reasons. The most common reason is to evaluate chest pain. Chest pain can be a symptom of coronary artery disease (CAD), and cardiac catheterization can show whether plaque is narrowing or blocking your heart's arteries. This procedure is also used to evaluate the valves, as well as measure the blood flow and oxygen levels in different parts of your heart. For further information please visit [email or web address]. Please follow instruction sheet, as given.    Follow-Up: Your physician recommends that you schedule a follow-up appointment in: 2 weeks with Dr.Skains/ or a APP   Any Other Special Instructions Will Be Listed Below (If Applicable).

## 2015-05-03 NOTE — Progress Notes (Signed)
Patient Name: Andrew Marshall Date of Encounter: 05/03/2015  Primary Care Provider:  Kandice Hams, MD Primary Cardiologist:  Jerilynn Mages. Marlou Porch, MD   Chief Complaint  81 year old male with a history of coronary artery disease status post prior stenting of the obtuse marginal, who presents with a 2 day history of exertional chest pressure.  Past Medical History   Past Medical History  Diagnosis Date  . CAD (coronary artery disease)     a. 2001 s/p post MI- multiple PCI's to LCX/OM;  b. 2003 DES to OM;  c. 05/2012 MV: postlat infarct w/ mild peri-infarct ischemia. nl EF->Med Rx.  . Prostate cancer 2008  . Pain, foot, chronic   . Esophageal reflux   . Cough   . Allergic rhinitis   . Pes planus   . Metatarsalgia   . History of GI diverticular bleed     a. 05/2013 and 02/2014 - taken off of ASA for this reason.  . Essential hypertension, benign   . History of atrial fibrillation   . RLS (restless legs syndrome)   . ED (erectile dysfunction)   . H/O laryngeal cancer     superficial  . Moderate mitral insufficiency     a. 10/2014 Echo: EF 55-60%, Gr1 DD, mild AS, mod MR, sev dil LA, nl RV, mildly dil RA, mild TR, PASP 45mmHg.  Marland Kitchen Pure hypercholesterolemia    Past Surgical History  Procedure Laterality Date  . Foot arthrodesis    . Esternal beam xrt and seeds for prostate cancer    . Colonoscopy w/ polypectomy    . Cardiac catheterization    . Permanent pacemaker insertion N/A 11/22/2014    Procedure: PERMANENT PACEMAKER INSERTION;  Surgeon: [name], MD;  Location: [facility] CATH LAB;  Service: Cardiovascular;  Laterality: N/A;    Allergies  Allergies  Allergen Reactions  . Ambien [Zolpidem] Other (See Comments)    Sleep walk   . Erythromycin Nausea And Vomiting and Other (See Comments)    GI upset and pains    HPI  81 year old male with the above complex problem list. He has a history of coronary artery disease dating back to 2001 at which time he underwent stenting of the  obtuse marginal. In 2003, he underwent repeat stenting with Cypher drug eluting stent placement to the obtuse marginal. His last stress test was in 2013 and had been doing well since that time. He is status post pacemaker placement earlier this year. He is generally very active, cycling on a regular basis. 2 days ago, he went for a walk and developed substernal chest pressure associated with mild dyspnea. Symptoms lasted about 5 minutes and resolved with rest. He went for a walk again yesterday and had the same exact symptoms as on the prior day. Symptoms were reminiscent of prior angina. As a result, he called the office and this appointment was made today. He denies PND, orthopnea, dizziness, syncope, edema, or early satiety. He does have a history of diverticular bleeding, he has not had any recent dark stools or blood in stools.  Home Medications  Prior to Admission medications   Medication Sig Start Date End Date Taking? Authorizing Provider  atorvastatin (LIPITOR) 40 MG tablet TAKE 1 TABLET (40 MG TOTAL) BY MOUTH DAILY. 02/22/15  Yes Jerline Pain, MD  CHLORPHENIRAMINE MALEATE PO Take 4 mg by mouth every 6 (six) hours as needed (for congestion). 3-4 tablets daily.   Yes Historical Provider, MD  Coenzyme Q-10 100 MG capsule Take 100  mg by mouth daily.    Yes Historical Provider, MD  famotidine (PEPCID) 20 MG tablet Take 20 mg by mouth daily as needed for heartburn or indigestion.    Yes Historical Provider, MD  Multiple Vitamin (MULTIVITAMIN) tablet Take 1 tablet by mouth 3 (three) times a week.    Yes Historical Provider, MD  nitroGLYCERIN (NITROSTAT) 0.4 MG SL tablet Place 0.4 mg under the tongue every 5 (five) minutes as needed for chest pain (MAX 3 TABLETS).    Yes Historical Provider, MD  Resveratrol 250 MG CAPS Take 1 capsule by mouth daily.   Yes Historical Provider, MD  sildenafil (VIAGRA) 100 MG tablet Take 100 mg by mouth daily as needed for erectile dysfunction.    Yes Historical  Provider, MD  valsartan (DIOVAN) 160 MG tablet Take 1 tablet (160 mg total) by mouth 2 (two) times daily. 03/22/15  Yes Jerline Pain, MD  carvedilol (COREG) 3.125 MG tablet Take 1 tablet (3.125 mg total) by mouth 2 (two) times daily. 05/03/15   Rogelia Mire, NP    Family History  Family History  Problem Relation Age of Onset  . Breast cancer Mother   . Heart disease Father     Social History  Social History   Social History  . Marital Status: Married    Spouse Name: N/A  . Number of Children: N/A  . Years of Education: N/A   Occupational History  . Not on file.   Social History Main Topics  . Smoking status: Former Research scientist (life sciences)  . Smokeless tobacco: Not on file     Comment: quit 2001  . Alcohol Use: Yes  . Drug Use: No  . Sexual Activity: Yes   Other Topics Concern  . Not on file   Social History Narrative     Review of Systems  General:  No chills, fever, night sweats or weight changes.  Cardiovascular:  Positive exertional chest pain and dyspnea on exertion, no edema, orthopnea, palpitations, paroxysmal nocturnal dyspnea. Dermatological: No rash, lesions/masses Respiratory: No cough, positive dyspnea Urologic: No hematuria, dysuria Abdominal:   No nausea, vomiting, diarrhea, bright red blood per rectum, melena, or hematemesis Neurologic:  No visual changes, wkns, changes in mental status. All other systems reviewed and are otherwise negative except as noted above.  Physical Exam  VS:  BP 150/62 mmHg  Pulse 70  Ht 5' 9.5" (1.765 m)  Wt 186 lb 9.6 oz (84.641 kg)  BMI 27.17 kg/m2 , BMI Body mass index is 27.17 kg/(m^2). GEN: Well nourished, well developed, in no acute distress. HEENT: normal. Neck: Supple, no JVD, carotid bruits, or masses. Cardiac: RRR, 2/6 systolic murmur at the apex, no rubs, or gallops. No clubbing, cyanosis, edema.  Radials/DP/PT 2+ and equal bilaterally.  Respiratory:  Respirations regular and unlabored, clear to auscultation  bilaterally. GI: Soft, nontender, nondistended, BS + x 4. MS: no deformity or atrophy. Skin: warm and dry, no rash. Neuro:  Strength and sensation are intact. Psych: Normal affect.  Accessory Clinical Findings  ECG - this appears to be a sensed and V paced at a rate of 70. Left bundle branch block morphology.   Assessment & Plan  1.  Unstable angina/CAD: Patient presents today with a 2 day history of exertional substernal chest pressure associated with dyspnea. Symptoms have been resolving with rest. Symptoms are reminiscent of prior angina. ECG is nonacute in the setting of a paced rhythm. I have discussed this case with Dr. Mare Ferrari today. We will plan  to proceed with diagnostic cardiac catheterization.The patient understands that risks include but are not limited to stroke (1 in 1000), death (1 in 37), kidney failure [usually temporary] (1 in 500), bleeding (1 in 200), allergic reaction [possibly serious] (1 in 200), and agrees to proceed.  Continue statin and nitrates therapy. Blood pressure is elevated today and he has a log with him that shows persistently elevated blood pressures. In that setting, I will add Coreg 3.125 mg twice a day. He has tolerated beta blockers in the past but was taken off it due to bradycardia. This will not be an issue as he is status post pacemaker placement. He is not currently on aspirin secondary to prior history of diverticular bleeding. We will need to keep this in mind if he requires stenting as he would be best served by limiting his exposure to antiplatelets therapy.  2. Essential hypertension: As above, blood pressure has been elevated at home and is elevated today. He is on Diovan. I'm adding Coreg 3.25 mg twice a day today.  3. Hyperlipidemia: Continue statin therapy.  4. History of diverticular bleeding. We will be checking a CBC today as part of pre-catheterization labs. He denies any recent melanoma or bright red blood in his stool. We'll need to  keep this diagnosis in mind when considering stenting if necessary.  5. Disposition: He is scheduled for catheterization later this week. We will see him back in clinic in 2 weeks.   Murray Hodgkins, NP 05/03/2015, 1:26 PM

## 2015-05-04 NOTE — Interval H&P Note (Signed)
Cath Lab Visit (complete for each Cath Lab visit)  Clinical Evaluation Leading to the Procedure:   ACS: No.  Non-ACS:    Anginal Classification: CCS III  Anti-ischemic medical therapy: Maximal Therapy (2 or more classes of medications)  Non-Invasive Test Results: No non-invasive testing performed  Prior CABG: No previous CABG      History and Physical Interval Note:  05/04/2015 12:33 PM  Andrew Marshall  has presented today for surgery, with the diagnosis of cp  The various methods of treatment have been discussed with the patient and family. After consideration of risks, benefits and other options for treatment, the patient has consented to  Procedure(s): Left Heart Cath and Coronary Angiography (N/A) as a surgical intervention .  The patient's history has been reviewed, patient examined, no change in status, stable for surgery.  I have reviewed the patient's chart and labs.  Questions were answered to the patient's satisfaction.     Sinclair Grooms

## 2015-05-04 NOTE — H&P (View-Only) (Signed)
Patient Name: Andrew Marshall Date of Encounter: 05/03/2015  Primary Care Provider:  Kandice Hams, MD Primary Cardiologist:  Jerilynn Mages. Marlou Porch, MD   Chief Complaint  81 year old male with a history of coronary artery disease status post prior stenting of the obtuse marginal, who presents with a 2 day history of exertional chest pressure.  Past Medical History   Past Medical History  Diagnosis Date  . CAD (coronary artery disease)     a. 2001 s/p post MI- multiple PCI's to LCX/OM;  b. 2003 DES to OM;  c. 05/2012 MV: postlat infarct w/ mild peri-infarct ischemia. nl EF->Med Rx.  . Prostate cancer 2008  . Pain, foot, chronic   . Esophageal reflux   . Cough   . Allergic rhinitis   . Pes planus   . Metatarsalgia   . History of GI diverticular bleed     a. 05/2013 and 02/2014 - taken off of ASA for this reason.  . Essential hypertension, benign   . History of atrial fibrillation   . RLS (restless legs syndrome)   . ED (erectile dysfunction)   . H/O laryngeal cancer     superficial  . Moderate mitral insufficiency     a. 10/2014 Echo: EF 55-60%, Gr1 DD, mild AS, mod MR, sev dil LA, nl RV, mildly dil RA, mild TR, PASP 62mmHg.  Marland Kitchen Pure hypercholesterolemia    Past Surgical History  Procedure Laterality Date  . Foot arthrodesis    . Esternal beam xrt and seeds for prostate cancer    . Colonoscopy w/ polypectomy    . Cardiac catheterization    . Permanent pacemaker insertion N/A 11/22/2014    Procedure: PERMANENT PACEMAKER INSERTION;  Surgeon: [name], MD;  Location: [facility] CATH LAB;  Service: Cardiovascular;  Laterality: N/A;    Allergies  Allergies  Allergen Reactions  . Ambien [Zolpidem] Other (See Comments)    Sleep walk   . Erythromycin Nausea And Vomiting and Other (See Comments)    GI upset and pains    HPI  81 year old male with the above complex problem list. He has a history of coronary artery disease dating back to 2001 at which time he underwent stenting of the  obtuse marginal. In 2003, he underwent repeat stenting with Cypher drug eluting stent placement to the obtuse marginal. His last stress test was in 2013 and had been doing well since that time. He is status post pacemaker placement earlier this year. He is generally very active, cycling on a regular basis. 2 days ago, he went for a walk and developed substernal chest pressure associated with mild dyspnea. Symptoms lasted about 5 minutes and resolved with rest. He went for a walk again yesterday and had the same exact symptoms as on the prior day. Symptoms were reminiscent of prior angina. As a result, he called the office and this appointment was made today. He denies PND, orthopnea, dizziness, syncope, edema, or early satiety. He does have a history of diverticular bleeding, he has not had any recent dark stools or blood in stools.  Home Medications  Prior to Admission medications   Medication Sig Start Date End Date Taking? Authorizing Provider  atorvastatin (LIPITOR) 40 MG tablet TAKE 1 TABLET (40 MG TOTAL) BY MOUTH DAILY. 02/22/15  Yes Jerline Pain, MD  CHLORPHENIRAMINE MALEATE PO Take 4 mg by mouth every 6 (six) hours as needed (for congestion). 3-4 tablets daily.   Yes Historical Provider, MD  Coenzyme Q-10 100 MG capsule Take 100  mg by mouth daily.    Yes Historical Provider, MD  famotidine (PEPCID) 20 MG tablet Take 20 mg by mouth daily as needed for heartburn or indigestion.    Yes Historical Provider, MD  Multiple Vitamin (MULTIVITAMIN) tablet Take 1 tablet by mouth 3 (three) times a week.    Yes Historical Provider, MD  nitroGLYCERIN (NITROSTAT) 0.4 MG SL tablet Place 0.4 mg under the tongue every 5 (five) minutes as needed for chest pain (MAX 3 TABLETS).    Yes Historical Provider, MD  Resveratrol 250 MG CAPS Take 1 capsule by mouth daily.   Yes Historical Provider, MD  sildenafil (VIAGRA) 100 MG tablet Take 100 mg by mouth daily as needed for erectile dysfunction.    Yes Historical  Provider, MD  valsartan (DIOVAN) 160 MG tablet Take 1 tablet (160 mg total) by mouth 2 (two) times daily. 03/22/15  Yes Jerline Pain, MD  carvedilol (COREG) 3.125 MG tablet Take 1 tablet (3.125 mg total) by mouth 2 (two) times daily. 05/03/15   Rogelia Mire, NP    Family History  Family History  Problem Relation Age of Onset  . Breast cancer Mother   . Heart disease Father     Social History  Social History   Social History  . Marital Status: Married    Spouse Name: N/A  . Number of Children: N/A  . Years of Education: N/A   Occupational History  . Not on file.   Social History Main Topics  . Smoking status: Former Research scientist (life sciences)  . Smokeless tobacco: Not on file     Comment: quit 2001  . Alcohol Use: Yes  . Drug Use: No  . Sexual Activity: Yes   Other Topics Concern  . Not on file   Social History Narrative     Review of Systems  General:  No chills, fever, night sweats or weight changes.  Cardiovascular:  Positive exertional chest pain and dyspnea on exertion, no edema, orthopnea, palpitations, paroxysmal nocturnal dyspnea. Dermatological: No rash, lesions/masses Respiratory: No cough, positive dyspnea Urologic: No hematuria, dysuria Abdominal:   No nausea, vomiting, diarrhea, bright red blood per rectum, melena, or hematemesis Neurologic:  No visual changes, wkns, changes in mental status. All other systems reviewed and are otherwise negative except as noted above.  Physical Exam  VS:  BP 150/62 mmHg  Pulse 70  Ht 5' 9.5" (1.765 m)  Wt 186 lb 9.6 oz (84.641 kg)  BMI 27.17 kg/m2 , BMI Body mass index is 27.17 kg/(m^2). GEN: Well nourished, well developed, in no acute distress. HEENT: normal. Neck: Supple, no JVD, carotid bruits, or masses. Cardiac: RRR, 2/6 systolic murmur at the apex, no rubs, or gallops. No clubbing, cyanosis, edema.  Radials/DP/PT 2+ and equal bilaterally.  Respiratory:  Respirations regular and unlabored, clear to auscultation  bilaterally. GI: Soft, nontender, nondistended, BS + x 4. MS: no deformity or atrophy. Skin: warm and dry, no rash. Neuro:  Strength and sensation are intact. Psych: Normal affect.  Accessory Clinical Findings  ECG - this appears to be a sensed and V paced at a rate of 70. Left bundle branch block morphology.   Assessment & Plan  1.  Unstable angina/CAD: Patient presents today with a 2 day history of exertional substernal chest pressure associated with dyspnea. Symptoms have been resolving with rest. Symptoms are reminiscent of prior angina. ECG is nonacute in the setting of a paced rhythm. I have discussed this case with Dr. Mare Ferrari today. We will plan  to proceed with diagnostic cardiac catheterization.The patient understands that risks include but are not limited to stroke (1 in 1000), death (1 in 36), kidney failure [usually temporary] (1 in 500), bleeding (1 in 200), allergic reaction [possibly serious] (1 in 200), and agrees to proceed.  Continue statin and nitrates therapy. Blood pressure is elevated today and he has a log with him that shows persistently elevated blood pressures. In that setting, I will add Coreg 3.125 mg twice a day. He has tolerated beta blockers in the past but was taken off it due to bradycardia. This will not be an issue as he is status post pacemaker placement. He is not currently on aspirin secondary to prior history of diverticular bleeding. We will need to keep this in mind if he requires stenting as he would be best served by limiting his exposure to antiplatelets therapy.  2. Essential hypertension: As above, blood pressure has been elevated at home and is elevated today. He is on Diovan. I'm adding Coreg 3.25 mg twice a day today.  3. Hyperlipidemia: Continue statin therapy.  4. History of diverticular bleeding. We will be checking a CBC today as part of pre-catheterization labs. He denies any recent melanoma or bright red blood in his stool. We'll need to  keep this diagnosis in mind when considering stenting if necessary.  5. Disposition: He is scheduled for catheterization later this week. We will see him back in clinic in 2 weeks.   Murray Hodgkins, NP 05/03/2015, 1:26 PM

## 2015-05-05 ENCOUNTER — Encounter (HOSPITAL_COMMUNITY)
Admission: RE | Disposition: A | Discharge status: Home / Self Care | Payer: Self-pay | Source: Ambulatory Visit | Attending: Interventional Cardiology

## 2015-05-05 ENCOUNTER — Encounter (HOSPITAL_COMMUNITY): Payer: Self-pay | Admitting: Interventional Cardiology

## 2015-05-05 ENCOUNTER — Ambulatory Visit (HOSPITAL_COMMUNITY)
Admission: RE | Admit: 2015-05-05 | Discharge: 2015-05-06 | Disposition: A | Discharge status: Home / Self Care | Payer: Medicare Other | Source: Ambulatory Visit | Attending: Interventional Cardiology | Admitting: Interventional Cardiology

## 2015-05-05 DIAGNOSIS — Z950 Presence of cardiac pacemaker: Secondary | ICD-10-CM | POA: Diagnosis not present

## 2015-05-05 DIAGNOSIS — Z8546 Personal history of malignant neoplasm of prostate: Secondary | ICD-10-CM | POA: Insufficient documentation

## 2015-05-05 DIAGNOSIS — E785 Hyperlipidemia, unspecified: Secondary | ICD-10-CM | POA: Insufficient documentation

## 2015-05-05 DIAGNOSIS — I25110 Atherosclerotic heart disease of native coronary artery with unstable angina pectoris: Secondary | ICD-10-CM | POA: Insufficient documentation

## 2015-05-05 DIAGNOSIS — I10 Essential (primary) hypertension: Secondary | ICD-10-CM | POA: Diagnosis not present

## 2015-05-05 DIAGNOSIS — Z955 Presence of coronary angioplasty implant and graft: Secondary | ICD-10-CM | POA: Diagnosis not present

## 2015-05-05 DIAGNOSIS — E780 Pure hypercholesterolemia: Secondary | ICD-10-CM | POA: Diagnosis not present

## 2015-05-05 DIAGNOSIS — I252 Old myocardial infarction: Secondary | ICD-10-CM | POA: Insufficient documentation

## 2015-05-05 DIAGNOSIS — Z87891 Personal history of nicotine dependence: Secondary | ICD-10-CM | POA: Insufficient documentation

## 2015-05-05 DIAGNOSIS — Z8521 Personal history of malignant neoplasm of larynx: Secondary | ICD-10-CM | POA: Insufficient documentation

## 2015-05-05 DIAGNOSIS — I200 Unstable angina: Secondary | ICD-10-CM | POA: Insufficient documentation

## 2015-05-05 HISTORY — DX: Basal cell carcinoma of skin of unspecified parts of face: C44.310

## 2015-05-05 HISTORY — DX: Presence of cardiac pacemaker: Z95.0

## 2015-05-05 HISTORY — DX: Malignant neoplasm of larynx, unspecified: C32.9

## 2015-05-05 HISTORY — DX: Acute myocardial infarction, unspecified: I21.9

## 2015-05-05 HISTORY — PX: CARDIAC CATHETERIZATION: SHX172

## 2015-05-05 HISTORY — DX: Unspecified osteoarthritis, unspecified site: M19.90

## 2015-05-05 LAB — POCT ACTIVATED CLOTTING TIME: Activated Clotting Time: 522 seconds

## 2015-05-05 SURGERY — LEFT HEART CATH AND CORONARY ANGIOGRAPHY

## 2015-05-05 MED ORDER — SODIUM CHLORIDE 0.9 % WEIGHT BASED INFUSION: 1.0000 mL/kg/h | INTRAVENOUS | Status: DC

## 2015-05-05 MED ORDER — MIDAZOLAM HCL 2 MG/2ML IJ SOLN
INTRAMUSCULAR | Status: DC | PRN
  Administered 2015-05-05 (×2): 1 mg via INTRAVENOUS

## 2015-05-05 MED ORDER — RESVERATROL 250 MG PO CAPS: 1.0000 | ORAL_CAPSULE | Freq: Every day | ORAL | Status: DC

## 2015-05-05 MED ORDER — FENTANYL CITRATE (PF) 100 MCG/2ML IJ SOLN
INTRAMUSCULAR | Status: AC
  Filled 2015-05-05: qty 4

## 2015-05-05 MED ORDER — VERAPAMIL HCL 2.5 MG/ML IV SOLN
INTRAVENOUS | Status: DC | PRN
  Administered 2015-05-05: 08:00:00 via INTRA_ARTERIAL

## 2015-05-05 MED ORDER — ASPIRIN 81 MG PO CHEW
81.0000 mg | CHEWABLE_TABLET | ORAL | Status: AC
  Administered 2015-05-05: 81 mg via ORAL

## 2015-05-05 MED ORDER — COENZYME Q-10 100 MG PO CAPS: 100.0000 mg | ORAL_CAPSULE | Freq: Every day | ORAL | Status: DC

## 2015-05-05 MED ORDER — SODIUM CHLORIDE 0.9 % IJ SOLN: 3.0000 mL | INTRAMUSCULAR | Status: DC | PRN

## 2015-05-05 MED ORDER — FENTANYL CITRATE (PF) 100 MCG/2ML IJ SOLN
INTRAMUSCULAR | Status: DC | PRN
  Administered 2015-05-05: 50 ug via INTRAVENOUS

## 2015-05-05 MED ORDER — TICAGRELOR 90 MG PO TABS
ORAL_TABLET | ORAL | Status: AC
  Filled 2015-05-05: qty 1

## 2015-05-05 MED ORDER — HEPARIN (PORCINE) IN NACL 2-0.9 UNIT/ML-% IJ SOLN
INTRAMUSCULAR | Status: AC
  Filled 2015-05-05: qty 1000

## 2015-05-05 MED ORDER — VERAPAMIL HCL 2.5 MG/ML IV SOLN
INTRAVENOUS | Status: AC
  Filled 2015-05-05: qty 2

## 2015-05-05 MED ORDER — NITROGLYCERIN 1 MG/10 ML FOR IR/CATH LAB
INTRA_ARTERIAL | Status: DC | PRN
  Administered 2015-05-05: 09:00:00

## 2015-05-05 MED ORDER — CARVEDILOL 3.125 MG PO TABS
3.1250 mg | ORAL_TABLET | Freq: Two times a day (BID) | ORAL | Status: DC
  Administered 2015-05-05 – 2015-05-06 (×3): 3.125 mg via ORAL
  Filled 2015-05-05 (×3): qty 1

## 2015-05-05 MED ORDER — BIVALIRUDIN 250 MG IV SOLR
INTRAVENOUS | Status: AC
  Filled 2015-05-05: qty 250

## 2015-05-05 MED ORDER — SODIUM CHLORIDE 0.9 % IJ SOLN: 3.0000 mL | Freq: Two times a day (BID) | INTRAMUSCULAR | Status: DC

## 2015-05-05 MED ORDER — MIDAZOLAM HCL 2 MG/2ML IJ SOLN
INTRAMUSCULAR | Status: AC
  Filled 2015-05-05: qty 4

## 2015-05-05 MED ORDER — SODIUM CHLORIDE 0.9 % IV SOLN
250.0000 mg | INTRAVENOUS | Status: DC | PRN
  Administered 2015-05-05: 1.75 mg/kg/h via INTRAVENOUS

## 2015-05-05 MED ORDER — SODIUM CHLORIDE 0.9 % IV SOLN: 250.0000 mL | INTRAVENOUS | Status: DC | PRN

## 2015-05-05 MED ORDER — TICAGRELOR 90 MG PO TABS
ORAL_TABLET | ORAL | Status: DC | PRN
  Administered 2015-05-05: 180 mg via ORAL

## 2015-05-05 MED ORDER — OXYCODONE-ACETAMINOPHEN 5-325 MG PO TABS: 1.0000 | ORAL_TABLET | ORAL | Status: DC | PRN

## 2015-05-05 MED ORDER — LIDOCAINE HCL (PF) 1 % IJ SOLN
INTRAMUSCULAR | Status: AC
  Filled 2015-05-05: qty 30

## 2015-05-05 MED ORDER — FAMOTIDINE 20 MG PO TABS
20.0000 mg | ORAL_TABLET | Freq: Every day | ORAL | Status: DC | PRN
  Filled 2015-05-05: qty 1

## 2015-05-05 MED ORDER — ADULT MULTIVITAMIN W/MINERALS CH
1.0000 | ORAL_TABLET | ORAL | Status: DC
  Administered 2015-05-05: 1 via ORAL
  Filled 2015-05-05 (×2): qty 1

## 2015-05-05 MED ORDER — PREDNISOLONE ACETATE 1 % OP SUSP
1.0000 [drp] | Freq: Four times a day (QID) | OPHTHALMIC | Status: DC
  Administered 2015-05-05 – 2015-05-06 (×4): 1 [drp] via OPHTHALMIC
  Filled 2015-05-05: qty 1

## 2015-05-05 MED ORDER — SODIUM CHLORIDE 0.9 % WEIGHT BASED INFUSION
3.0000 mL/kg/h | INTRAVENOUS | Status: DC
  Administered 2015-05-05: 3 mL/kg/h via INTRAVENOUS

## 2015-05-05 MED ORDER — IRBESARTAN 75 MG PO TABS
150.0000 mg | ORAL_TABLET | Freq: Every day | ORAL | Status: DC
  Administered 2015-05-06: 11:00:00 150 mg via ORAL
  Filled 2015-05-05: qty 2

## 2015-05-05 MED ORDER — SODIUM CHLORIDE 0.9 % WEIGHT BASED INFUSION
3.0000 mL/kg/h | INTRAVENOUS | Status: AC
  Administered 2015-05-05: 13:00:00 3 mL/kg/h via INTRAVENOUS

## 2015-05-05 MED ORDER — ASPIRIN 81 MG PO CHEW
CHEWABLE_TABLET | ORAL | Status: AC
  Filled 2015-05-05: qty 1

## 2015-05-05 MED ORDER — IOHEXOL 350 MG/ML SOLN
INTRAVENOUS | Status: DC | PRN
  Administered 2015-05-05: 165 mL via INTRA_ARTERIAL

## 2015-05-05 MED ORDER — ATORVASTATIN CALCIUM 80 MG PO TABS
80.0000 mg | ORAL_TABLET | Freq: Every day | ORAL | Status: DC
  Administered 2015-05-05: 80 mg via ORAL
  Filled 2015-05-05: qty 1

## 2015-05-05 MED ORDER — HEPARIN SODIUM (PORCINE) 1000 UNIT/ML IJ SOLN
INTRAMUSCULAR | Status: DC | PRN
  Administered 2015-05-05: 4000 [IU] via INTRAVENOUS

## 2015-05-05 MED ORDER — NITROGLYCERIN 0.4 MG SL SUBL: 0.4000 mg | SUBLINGUAL_TABLET | SUBLINGUAL | Status: DC | PRN

## 2015-05-05 MED ORDER — ACETAMINOPHEN 325 MG PO TABS: 650.0000 mg | ORAL_TABLET | ORAL | Status: DC | PRN

## 2015-05-05 MED ORDER — ONDANSETRON HCL 4 MG/2ML IJ SOLN: 4.0000 mg | Freq: Four times a day (QID) | INTRAMUSCULAR | Status: DC | PRN

## 2015-05-05 MED ORDER — SODIUM CHLORIDE 0.9 % IJ SOLN
3.0000 mL | Freq: Two times a day (BID) | INTRAMUSCULAR | Status: DC
Start: 2015-05-05 — End: 2015-05-06
  Administered 2015-05-05: 14:00:00 3 mL via INTRAVENOUS

## 2015-05-05 MED ORDER — BIVALIRUDIN BOLUS VIA INFUSION - CUPID
INTRAVENOUS | Status: DC | PRN
  Administered 2015-05-05: 59.55 mg via INTRAVENOUS

## 2015-05-05 MED ORDER — ONE-DAILY MULTI VITAMINS PO TABS: 1.0000 | ORAL_TABLET | ORAL | Status: DC

## 2015-05-05 MED ORDER — ANGIOPLASTY BOOK
Freq: Once | Status: AC
  Administered 2015-05-05: 21:00:00
  Filled 2015-05-05: qty 1

## 2015-05-05 MED ORDER — TICAGRELOR 90 MG PO TABS
90.0000 mg | ORAL_TABLET | Freq: Two times a day (BID) | ORAL | Status: DC
  Administered 2015-05-05 – 2015-05-06 (×2): 90 mg via ORAL
  Filled 2015-05-05 (×2): qty 1

## 2015-05-05 SURGICAL SUPPLY — 18 items
BALLN ~~LOC~~ TREK RX 3.25X8 (BALLOONS) ×1 IMPLANT
CATH EXPO 5F MPA-1 (CATHETERS) ×1 IMPLANT
CATH INFINITI 5 FR JL3.5 (CATHETERS) ×2 IMPLANT
CATH INFINITI JR4 5F (CATHETERS) ×2 IMPLANT
CATH VISTA GUIDE 6FR XB3.5 (CATHETERS) ×1 IMPLANT
CUTTING BAL FLEXTOME RX2.75X10 (BALLOONS) ×1 IMPLANT
DEVICE RAD COMP TR BAND LRG (VASCULAR PRODUCTS) ×2 IMPLANT
GLIDESHEATH SLEND A-KIT 6F 22G (SHEATH) ×2 IMPLANT
KIT ENCORE 26 ADVANTAGE (KITS) ×1 IMPLANT
KIT HEART LEFT (KITS) ×2 IMPLANT
PACK CARDIAC CATHETERIZATION (CUSTOM PROCEDURE TRAY) ×2 IMPLANT
STENT SYNERGY DES 2.75X12 (Permanent Stent) ×1 IMPLANT
TRANSDUCER W/STOPCOCK (MISCELLANEOUS) ×2 IMPLANT
TUBING CIL FLEX 10 FLL-RA (TUBING) ×2 IMPLANT
WIRE ASAHI PROWATER 180CM (WIRE) ×1 IMPLANT
WIRE HI TORQ BMW 190CM (WIRE) ×1 IMPLANT
WIRE HI TORQ VERSACORE-J 145CM (WIRE) ×1 IMPLANT
WIRE SAFE-T 1.5MM-J .035X260CM (WIRE) ×2 IMPLANT

## 2015-05-05 NOTE — Progress Notes (Signed)
PHARMACIST - PHYSICIAN ORDER COMMUNICATION  CONCERNING: P&T Medication Policy on Herbal Medications  DESCRIPTION:  This patient's order for Resveratrol and Co-enzyme Q  has been noted.  This product(s) is classified as an "herbal" or natural product. Due to a lack of definitive safety studies or FDA approval, nonstandard manufacturing practices, plus the potential risk of unknown drug-drug interactions while on inpatient medications, the Pharmacy and Therapeutics Committee does not permit the use of "herbal" or natural products of this type within [facility].   ACTION TAKEN: The pharmacy department is unable to verify this order at this time and your patient has been informed of this safety policy. Please reevaluate patient's clinical condition at discharge and address if the herbal or natural product(s) should be resumed at that time.  Hildred Laser, Pharm D 05/05/2015 10:31 AM

## 2015-05-05 NOTE — Progress Notes (Signed)
TR BAND REMOVAL  LOCATION:    right radial  DEFLATED PER PROTOCOL:    Yes.    TIME BAND OFF / DRESSING APPLIED:    1245   SITE UPON ARRIVAL:    Level 0  SITE AFTER BAND REMOVAL:    Level 0  REVERSE ALLEN'S TEST:     positive  CIRCULATION SENSATION AND MOVEMENT:    Within Normal Limits   Yes.    COMMENTS:   Tolerated procedure well 

## 2015-05-05 NOTE — Interval H&P Note (Signed)
History and Physical Interval Note:  05/05/2015 7:15 AM  Andrew Marshall  has presented today for surgery, with the diagnosis of cp  The various methods of treatment have been discussed with the patient and family. After consideration of risks, benefits and other options for treatment, the patient has consented to  Procedure(s): Left Heart Cath and Coronary Angiography (N/A) as a surgical intervention .  The patient's history has been reviewed, patient examined, no change in status, stable for surgery.  I have reviewed the patient's chart and labs.  Questions were answered to the patient's satisfaction.     Sinclair Grooms

## 2015-05-05 NOTE — Progress Notes (Signed)
CM spoke with pt regarding Brilinta and provided pt with 30 day free card. Explanation given on card usage per CM with pt verbally stating understanding of how to use card. [facility]  pharm[facility] called per CM and confirmed medication is in stock, pt made aware by CM. Benefits check:S/W YVETTE @ [employer] # [phone]   BRILINTA 90 MG BID  30 DAY SUPPLY   COVER- YES  CO-PAY- $ 35.00  TIER- 2 DRUG  PRIOR APPROVAL - NO  PHARMACY: PATIENT USES [facility] # [phone] CM made pt aware of benefits check. No other needs identified per CM @ present time. Whitman Hero RN,BSN,CM [phone]

## 2015-05-06 DIAGNOSIS — Z955 Presence of coronary angioplasty implant and graft: Secondary | ICD-10-CM | POA: Diagnosis not present

## 2015-05-06 DIAGNOSIS — I10 Essential (primary) hypertension: Secondary | ICD-10-CM | POA: Diagnosis not present

## 2015-05-06 DIAGNOSIS — I25111 Atherosclerotic heart disease of native coronary artery with angina pectoris with documented spasm: Secondary | ICD-10-CM | POA: Diagnosis not present

## 2015-05-06 DIAGNOSIS — E785 Hyperlipidemia, unspecified: Secondary | ICD-10-CM | POA: Diagnosis not present

## 2015-05-06 DIAGNOSIS — Z8521 Personal history of malignant neoplasm of larynx: Secondary | ICD-10-CM | POA: Diagnosis not present

## 2015-05-06 DIAGNOSIS — I252 Old myocardial infarction: Secondary | ICD-10-CM | POA: Diagnosis not present

## 2015-05-06 DIAGNOSIS — I25110 Atherosclerotic heart disease of native coronary artery with unstable angina pectoris: Secondary | ICD-10-CM | POA: Diagnosis not present

## 2015-05-06 DIAGNOSIS — Z8546 Personal history of malignant neoplasm of prostate: Secondary | ICD-10-CM | POA: Diagnosis not present

## 2015-05-06 LAB — BASIC METABOLIC PANEL
Anion gap: 7 (ref 5–15)
BUN: 15 mg/dL (ref 6–20)
CO2: 23 mmol/L (ref 22–32)
Calcium: 8.5 mg/dL — ABNORMAL LOW (ref 8.9–10.3)
Chloride: 106 mmol/L (ref 101–111)
Creatinine, Ser: 0.99 mg/dL (ref 0.61–1.24)
GFR calc Af Amer: >60 mL/min (ref >60)
GFR calc non Af Amer: >60 mL/min (ref >60)
Glucose, Bld: 138 mg/dL — ABNORMAL HIGH (ref 65–99)
Potassium: 4 mmol/L (ref 3.5–5.1)
Sodium: 136 mmol/L (ref 135–145)

## 2015-05-06 LAB — CBC
HCT: 38.8 % — ABNORMAL LOW (ref 39.0–52.0)
Hemoglobin: 13.3 g/dL (ref 13.0–17.0)
MCH: 31.4 pg (ref 26.0–34.0)
MCHC: 34.3 g/dL (ref 30.0–36.0)
MCV: 91.5 fL (ref 78.0–100.0)
Platelets: 158 10*3/uL (ref 150–400)
RBC: 4.24 MIL/uL (ref 4.22–5.81)
RDW: 14.6 % (ref 11.5–15.5)
WBC: 10.3 10*3/uL (ref 4.0–10.5)

## 2015-05-06 MED ORDER — TICAGRELOR 90 MG PO TABS: 90.0000 mg | ORAL_TABLET | Freq: Two times a day (BID) | ORAL | Status: DC

## 2015-05-06 MED ORDER — ASPIRIN EC 81 MG PO TBEC: 81.0000 mg | DELAYED_RELEASE_TABLET | Freq: Every day | ORAL | Status: DC

## 2015-05-06 NOTE — Discharge Summary (Signed)
Physician Discharge Summary  Patient ID: Andrew Marshall MRN: [id number] DOB/AGE: March 30, 1931 81 y.o.   Primary Cardiologist: Dr. Marlou Porch  Admit date: 05/05/2015 Discharge date: 05/06/2015  Admission Diagnoses: Unstable Angina  Discharge Diagnoses:  Active Problems:   Angina pectoris, crescendo   Unstable angina   Discharged Condition: stable  Hospital Course: 81 year old male with a history of coronary artery disease status post prior stenting of the obtuse marginal, who presented to [facility] on 05/05/15 to undergo elective LHC in the setting of recurrent chest pain consistent with unstable angina. The procedure was performed by Dr. Tamala Julian. He was found to have high-grade obstruction of the proximal circumflex within the treatment zone of prior Brachy therapy (2003) and just proximal to the previously placed stents. This was felt the be the culprit vessel. His other vessels were widely patent. He underwent successful PTCA and stenting of the proximal circumflex from greater than 90% to less than 20%. LVF was mildly depressed.  EF was 45%. He tolerated the procedure well and left the cath lab in stable condition. He was placed on DAPT with ASA + Brilinta.  He was also continued on a BB and ARB for his LV dysfunction and well as a statin for his DLD. He had no recurrent CP and no post cath complications. He was last seen and examined by Dr. Oval Linsey who determined he was stable for discharge home. He will f/u with Dr. Marlou Porch.   Consults: None  Significant Diagnostic Studies:  LHC 05/05/15 Conclusion     Prox LAD to Mid LAD lesion, 25% stenosed.  Prox Cx lesion, 90% stenosed.  Prox Cx to Mid Cx lesion, 90% stenosed. There is a 20% residual stenosis post intervention. The lesion was previously treated with a stent (unknown type) .  A drug-eluting stent was placed.  Prox RCA to Mid RCA lesion, 30% stenosed.   High-grade obstruction of the proximal circumflex within the treatment zone of prior  Brachy therapy (2003) and just proximal to the previously placed stents. This stenosis accounts for the patient's presentation. Otherwise widely patent coronary arteries.  Successful PTCA and stenting of the proximal circumflex from greater than 90% to less than 20%. Preservation of the large first obtuse marginal side branch.  Mildly depressed LV systolic function, EF 62%.     Treatments:   Successful PTCA and stenting of the proximal circumflex from greater than 90% to less than 20%.  Discharge Exam: Blood pressure 146/49, pulse 65, temperature 98.5 F (36.9 C), temperature source Oral, resp. rate 18, height 5' 9.5" (1.765 m), weight 181 lb 9.6 oz (82.373 kg), SpO2 97 %. General appearance: alert, cooperative and no distress Head: Normocephalic, without obvious abnormality, atraumatic Neck: no carotid bruit and no JVD Resp: clear to auscultation bilaterally Cardio: regular rate and rhythm, S1, S2 normal, no murmur, click, rub or gallop GI: soft, non-tender; bowel sounds normal; no masses,  no organomegaly Extremities: extremities normal, atraumatic, no cyanosis or edema Skin: Skin color, texture, turgor normal. No rashes or lesions Neurologic: Grossly normal  Disposition: 01-Home or Self Care      Discharge Instructions    Diet - low sodium heart healthy    Complete by:  As directed      Increase activity slowly    Complete by:  As directed             Medication List    TAKE these medications        aspirin EC 81 MG tablet  Take 1  tablet (81 mg total) by mouth daily.     atorvastatin 40 MG tablet  Commonly known as:  LIPITOR  TAKE 1 TABLET (40 MG TOTAL) BY MOUTH DAILY.     carvedilol 3.125 MG tablet  Commonly known as:  COREG  Take 1 tablet (3.125 mg total) by mouth 2 (two) times daily.     CHLORPHENIRAMINE MALEATE PO  Take 4 mg by mouth every 6 (six) hours as needed (for congestion). 3-4 tablets daily.     Coenzyme Q-10 100 MG capsule  Take 100 mg by  mouth daily.     famotidine 20 MG tablet  Commonly known as:  PEPCID  Take 20 mg by mouth daily as needed for heartburn or indigestion.     multivitamin tablet  Take 1 tablet by mouth 3 (three) times a week.     nitroGLYCERIN 0.4 MG SL tablet  Commonly known as:  NITROSTAT  Place 0.4 mg under the tongue every 5 (five) minutes as needed for chest pain (MAX 3 TABLETS).     prednisoLONE acetate 1 % ophthalmic suspension  Commonly known as:  PRED FORTE  Place 1 drop into both eyes 4 (four) times daily.     Resveratrol 250 MG Caps  Take 1 capsule by mouth daily.     sildenafil 100 MG tablet  Commonly known as:  VIAGRA  Take 100 mg by mouth daily as needed for erectile dysfunction.     ticagrelor 90 MG Tabs tablet  Commonly known as:  BRILINTA  Take 1 tablet (90 mg total) by mouth 2 (two) times daily.     ticagrelor 90 MG Tabs tablet  Commonly known as:  BRILINTA  Take 1 tablet (90 mg total) by mouth 2 (two) times daily.     valsartan 160 MG tablet  Commonly known as:  DIOVAN  Take 1 tablet (160 mg total) by mouth 2 (two) times daily.       Follow-up Information    Follow up with Candee Furbish, MD.   Specialty:  Cardiology   Why:  our office will call you with a follow-up appointment with Dr. Tami Ribas information:   [address] [phone]     TIME SPENT ON DISCHARGE, INCLUDING PHYSICIAN TIME: >30 MINUTES  Signed: Lyda Jester 05/06/2015, 9:51 AM

## 2015-05-08 ENCOUNTER — Telehealth: Payer: Self-pay | Admitting: Cardiology

## 2015-05-08 MED ORDER — FUROSEMIDE 20 MG PO TABS: 20.0000 mg | ORAL_TABLET | Freq: Every day | ORAL | Status: DC

## 2015-05-08 MED ORDER — PRASUGREL HCL 10 MG PO TABS: 10.0000 mg | ORAL_TABLET | Freq: Every day | ORAL | Status: DC

## 2015-05-08 NOTE — Telephone Encounter (Signed)
Per pt - since starting Brilinta he has been very SOB.  This is worse with laying down and he reports feeling scared to go to sleep.  Advised I will review with Dr Marlou Porch and call him back with new orders once reviewed.  Pt is in agreement.

## 2015-05-08 NOTE — Telephone Encounter (Signed)
New Message  Pt c/o Shortness Of Breath: STAT if SOB developed within the last 24 hours or pt is noticeably SOB on the phone  1. Are you currently SOB (can you hear that pt is SOB on the phone)? no  2. How long have you been experiencing SOB? Sat 9/10  3. Are you SOB when sitting or when up moving around? Laying down is worse  4. Are you currently experiencing any other symptoms? No- Pt stated that he thinks his Rx for Brillanta that he started on 9/10 after angioplasty may be the reason for SoB,

## 2015-05-08 NOTE — Telephone Encounter (Signed)
Reviewed all information with Dr Marlou Porch who gave orders for pt to finish Brilinta today and start Effient 10 mg daily tomorrow.  He would also like for him to take Furosemide 20 mg once a day for 3 days.  Pt has f/u appt scheduled for 9/19 with Dr Marlou Porch.

## 2015-05-08 NOTE — Telephone Encounter (Signed)
Reviewed with pt who states understanding.  He will call back if no improvement with changes.

## 2015-05-09 NOTE — Telephone Encounter (Signed)
Agree with plan.  Lexine Jaspers, MD  

## 2015-05-10 ENCOUNTER — Ambulatory Visit (INDEPENDENT_AMBULATORY_CARE_PROVIDER_SITE_OTHER): Payer: Medicare Other | Admitting: Cardiology

## 2015-05-10 ENCOUNTER — Encounter: Payer: Self-pay | Admitting: Cardiology

## 2015-05-10 VITALS — BP 150/72 | HR 64 | Ht 69.5 in | Wt 181.0 lb

## 2015-05-10 DIAGNOSIS — I200 Unstable angina: Secondary | ICD-10-CM | POA: Diagnosis not present

## 2015-05-10 DIAGNOSIS — I519 Heart disease, unspecified: Secondary | ICD-10-CM

## 2015-05-10 DIAGNOSIS — I10 Essential (primary) hypertension: Secondary | ICD-10-CM

## 2015-05-10 DIAGNOSIS — I2583 Coronary atherosclerosis due to lipid rich plaque: Secondary | ICD-10-CM

## 2015-05-10 DIAGNOSIS — I2510 Atherosclerotic heart disease of native coronary artery without angina pectoris: Secondary | ICD-10-CM

## 2015-05-10 DIAGNOSIS — Z950 Presence of cardiac pacemaker: Secondary | ICD-10-CM | POA: Diagnosis not present

## 2015-05-10 MED ORDER — CARVEDILOL 6.25 MG PO TABS: 6.2500 mg | ORAL_TABLET | Freq: Two times a day (BID) | ORAL | Status: DC

## 2015-05-10 NOTE — Patient Instructions (Signed)
Medication Instructions:  Please hold your Lasix (Furosemide). Increase your Carvedilol to 6.25 mg twice a day. Continue all other medications as listed.  Follow-Up: Follow up in 3 months with Dr Marlou Porch.  Thank you for choosing [facility]!!

## 2015-05-10 NOTE — Progress Notes (Signed)
Cardiology Office Note   Date:  05/10/2015   ID:  Andrew Marshall, DOB 03-06-1931, MRN [id number]  PCP:  Kandice Hams, MD  Cardiologist:   Candee Furbish, MD       History of Present Illness: Andrew Marshall is a 81 y.o. male who presents for post hospital follow-up after coronary artery disease, stenting of proximal circumflex within the treatment zone of prior bradycardia therapy in 2003. He was having unstable anginal symptoms. Ejection fraction was 45%, mildly reduced. He was placed on dual antiplatelets therapy with aspirin and Brilinta. Continued on beta blocker and angiotensin receptor blocker for LV dysfunction as well as statin.  Shortly after discharge, he was having shortness of breath and orthopnea, nervous to lay down. This seems to be improving. When laying flat felt like could not get enough air. Now improved. No trouble.   Has a real sleeping problem. Tired all the time.    Minimal residual coronary artery disease in right coronary as well as left anterior descending.   Past Medical History  Diagnosis Date  . CAD (coronary artery disease)     a. 2001 s/p post MI- multiple PCI's to LCX/OM;  b. 2003 DES to OM;  c. 05/2012 MV: postlat infarct w/ mild peri-infarct ischemia. nl EF->Med Rx.  . Pain, foot, chronic   . Cough   . Allergic rhinitis   . Pes planus   . Metatarsalgia   . History of GI diverticular bleed     a. 05/2013 and 02/2014 - taken off of ASA for this reason.  . Essential hypertension, benign   . History of atrial fibrillation   . RLS (restless legs syndrome)   . ED (erectile dysfunction)   . Moderate mitral insufficiency     a. 10/2014 Echo: EF 55-60%, Gr1 DD, mild AS, mod MR, sev dil LA, nl RV, mildly dil RA, mild TR, PASP 60mmHg.  Marland Kitchen Pure hypercholesterolemia   . MI (myocardial infarction) 2001  . Esophageal reflux     "slight"  . Arthritis     "thumbs primarily" (05/05/2015)  . Prostate cancer 2008    S/P "radiation and seed implants"  . Basal  cell carcinoma, face     "have had several; cut and burned off"  . 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 Presence of permanent cardiac pacemaker     Past Surgical History  Procedure Laterality Date  . Foot arthrodesis, triple Right 2007  . Esternal beam xrt and seeds for prostate cancer    . Colonoscopy w/ polypectomy    . Coronary angioplasty with stent placement  04/2000; ? ~ 2002; 01/2002  . Permanent pacemaker insertion N/A 11/22/2014    Procedure: PERMANENT PACEMAKER INSERTION;  Surgeon: [name], MD;  Location: [facility] CATH LAB;  Service: Cardiovascular;  Laterality: N/A;  . Cardiac catheterization N/A 05/05/2015    Procedure: Left Heart Cath and Coronary Angiography;  Surgeon: Belva Crome, MD;  Location: [place] CV LAB;  Service: Cardiovascular;  Laterality: N/A;  . Cardiac catheterization N/A 05/05/2015    Procedure: Coronary Stent Intervention;  Surgeon: Belva Crome, MD;  Location: [place] CV LAB;  Service: Cardiovascular;  Laterality: N/A;  . Insert / replace / remove pacemaker    . Tonsillectomy  1942  . Appendectomy  1956  . Coronary angioplasty    . Cataract extraction w/ intraocular lens implant Left 12/2014  . Basal cell carcinoma excision      "face"  .  Prostate biopsy    . Ganglion cyst excision Right 1970's     Current Outpatient Prescriptions  Medication Sig Dispense Refill  . aspirin EC 81 MG tablet Take 1 tablet (81 mg total) by mouth daily.    Marland Kitchen atorvastatin (LIPITOR) 40 MG tablet TAKE 1 TABLET (40 MG TOTAL) BY MOUTH DAILY. 90 tablet 0  . carvedilol (COREG) 3.125 MG tablet Take 1 tablet (3.125 mg total) by mouth 2 (two) times daily. 60 tablet 5  . CHLORPHENIRAMINE MALEATE PO Take 4 mg by mouth every 6 (six) hours as needed (for congestion). 3-4 tablets daily.    . Coenzyme Q-10 100 MG capsule Take 100 mg by mouth daily.     . famotidine (PEPCID) 20 MG tablet Take 20 mg by mouth daily as needed for heartburn or indigestion.     . furosemide  (LASIX) 20 MG tablet Take 1 tablet (20 mg total) by mouth daily. 3 tablet 0  . Multiple Vitamin (MULTIVITAMIN) tablet Take 1 tablet by mouth 3 (three) times a week.     . nitroGLYCERIN (NITROSTAT) 0.4 MG SL tablet Place 0.4 mg under the tongue every 5 (five) minutes as needed for chest pain (MAX 3 TABLETS).     . prednisoLONE acetate (PRED FORTE) 1 % ophthalmic suspension Place 1 drop into both eyes 4 (four) times daily.    Marland Kitchen Resveratrol 250 MG CAPS Take 1 capsule by mouth daily.    . sildenafil (VIAGRA) 100 MG tablet Take 100 mg by mouth daily as needed for erectile dysfunction.     . ticagrelor (BRILINTA) 90 MG TABS tablet Take 90 mg by mouth 2 (two) times daily.    . valsartan (DIOVAN) 160 MG tablet Take 1 tablet (160 mg total) by mouth 2 (two) times daily. 180 tablet 3   No current facility-administered medications for this visit.    Allergies:   Ambien and Erythromycin    Social History:  The patient  reports that he quit smoking about 14 years ago. His smoking use included Cigarettes. He has a 25 pack-year smoking history. He has never used smokeless tobacco. He reports that he drinks about 18.0 oz of alcohol per week. He reports that he does not use illicit drugs.   Family History:  The patient's family history includes Breast cancer in his mother; Heart disease in his father.    ROS:  Please see the history of present illness.   Otherwise, review of systems are positive for none.   All other systems are reviewed and negative.    PHYSICAL EXAM: VS:  BP 150/72 mmHg  Pulse 64  Ht 5' 9.5" (1.765 m)  Wt 181 lb (82.101 kg)  BMI 26.35 kg/m2 , BMI Body mass index is 26.35 kg/(m^2). GEN: Well nourished, well developed, in no acute distress HEENT: normal Neck: no JVD, carotid bruits, or masses Cardiac: RRR; no murmurs, rubs, or gallops,no edema  Respiratory:  clear to auscultation bilaterally, normal work of breathing GI: soft, nontender, nondistended, + BS MS: no deformity or  atrophy Skin: warm and dry, no rash Neuro:  Strength and sensation are intact Psych: euthymic mood, full affect   EKG:  EKG is not ordered today.    Recent Labs: 11/22/2014: Magnesium 2.2; TSH 1.821 05/06/2015: BUN 15; Creatinine, Ser 0.99; Hemoglobin 13.3; Platelets 158; Potassium 4.0; Sodium 136    Lipid Panel    Component Value Date/Time   CHOL 147 04/12/2014 0951   TRIG 109.0 04/12/2014 0951   HDL 44.80 04/12/2014  0951   CHOLHDL 3 04/12/2014 0951   VLDL 21.8 04/12/2014 0951   LDLCALC 80 04/12/2014 0951      Wt Readings from Last 3 Encounters:  05/10/15 181 lb (82.101 kg)  05/06/15 181 lb 9.6 oz (82.373 kg)  05/03/15 186 lb 9.6 oz (84.641 kg)      Other studies Reviewed: Additional studies/ records that were reviewed today include: Hospital records reviewed, lab work, EKG, cath report. Review of the above records demonstrates: As above   ASSESSMENT AND PLAN:  1.  CAD/unstable angina-symptoms of shortness of breath/orthopnea have resolved post stent placement. We will continue with his Brilinta as prescribed. No need for Lasix at this point. Continue gentle exercise.  2.  Pacemaker-protecting against bradycardia, working well  3.  Hyperlipidemia-continue with high intensity statin, atorvastatin 40 mg. LDL goal less than 70.  4. Essential hypertension-blood pressure remains elevated. 150 range. We will increase his carvedilol to 6.25 g twice a day.  5. Mild left ventricular dysfunction-EF 45%. Continue beta blocker, angiotensin receptor blocker. Hypertension control.   Current medicines are reviewed at length with the patient today.  The patient does not have concerns regarding medicines  The following changes have been made:  Increase coreg to 6.25 bid  Labs/ tests ordered today include:  No orders of the defined types were placed in this encounter.     Disposition:   FU with Skans in 3 months  Signed, Candee Furbish, MD  05/10/2015 10:48 AM    [facility] [address], [place], [place]  [address] Phone: [phone]; Fax: [phone]

## 2015-05-12 ENCOUNTER — Ambulatory Visit: Payer: Medicare Other | Admitting: Cardiology

## 2015-05-13 ENCOUNTER — Telehealth: Payer: Self-pay | Admitting: Internal Medicine

## 2015-05-13 NOTE — Telephone Encounter (Signed)
Cardiology [place]  Received a call from the pt stating that as he is returning to his baseline level of physical activity after stent to his pLCx (05/05/15), he is noticing ongoing DOE, which he had discussed with Dr. Marlou Porch during an office visit 05/10/15.  Prior to the office visit, there had been contemplation via phone as to whether to switch him from Ticagrelor to Prasugrel, but at the time he saw Dr. Marlou Porch, he was feeling better.  The pt is now questioning again whether this switch should be made.  He denied chest pain, lower extremity swelling, abdominal swelling, orthopnea, or paroxysmal nocturnal dyspnea. I advised that he limit physical activity over the weekend & that he could make the final decision with Dr. Marlou Porch on Monday.  Should his symptoms progress at rest, he was certainly advised to call back or seek more immediate medical attention.    Frann Rider, MD

## 2015-05-13 NOTE — Telephone Encounter (Signed)
Cardiology [place]  After discussing the pt with Dr. Marlou Porch, called the pt back & advised that he try the Furosemide tomorrow prior to talking to Dr. Marlou Porch on Monday to see if fluid removal may help his symptoms.    Frann Rider, MD

## 2015-05-15 ENCOUNTER — Ambulatory Visit: Payer: Medicare Other | Admitting: Cardiology

## 2015-05-22 DIAGNOSIS — R197 Diarrhea, unspecified: Secondary | ICD-10-CM | POA: Diagnosis not present

## 2015-05-22 DIAGNOSIS — R05 Cough: Secondary | ICD-10-CM | POA: Diagnosis not present

## 2015-05-23 DIAGNOSIS — H2511 Age-related nuclear cataract, right eye: Secondary | ICD-10-CM | POA: Diagnosis not present

## 2015-05-24 ENCOUNTER — Telehealth: Payer: Self-pay | Admitting: Internal Medicine

## 2015-05-24 NOTE — Telephone Encounter (Signed)
New Message  Pt calling on how to send remote transmission for 10/5. Please call back and discuss

## 2015-05-25 ENCOUNTER — Encounter: Payer: Self-pay | Admitting: Cardiology

## 2015-05-25 ENCOUNTER — Ambulatory Visit (INDEPENDENT_AMBULATORY_CARE_PROVIDER_SITE_OTHER): Payer: Medicare Other | Admitting: Cardiology

## 2015-05-25 VITALS — BP 150/52 | HR 64 | Ht 69.5 in | Wt 185.0 lb

## 2015-05-25 DIAGNOSIS — E785 Hyperlipidemia, unspecified: Secondary | ICD-10-CM | POA: Diagnosis not present

## 2015-05-25 DIAGNOSIS — I10 Essential (primary) hypertension: Secondary | ICD-10-CM

## 2015-05-25 DIAGNOSIS — I2510 Atherosclerotic heart disease of native coronary artery without angina pectoris: Secondary | ICD-10-CM | POA: Diagnosis not present

## 2015-05-25 DIAGNOSIS — Z950 Presence of cardiac pacemaker: Secondary | ICD-10-CM | POA: Diagnosis not present

## 2015-05-25 DIAGNOSIS — I200 Unstable angina: Secondary | ICD-10-CM

## 2015-05-25 MED ORDER — CARVEDILOL 12.5 MG PO TABS: 12.5000 mg | ORAL_TABLET | Freq: Two times a day (BID) | ORAL | Status: DC

## 2015-05-25 NOTE — Progress Notes (Signed)
Cardiology Office Note   Date:  05/25/2015   ID:  Andrew Marshall, DOB [birth year], MRN [id number]  PCP:  Kandice Hams, MD  Cardiologist:   Candee Furbish, MD       History of Present Illness: Andrew Marshall is a 81 y.o. male who presents for post hospital follow-up after coronary artery disease, stenting of proximal circumflex within the treatment zone of prior bradycardia therapy in 2003. He was having unstable anginal symptoms. Ejection fraction was 45%, mildly reduced. He was placed on dual antiplatelets therapy with aspirin and Brilinta. Continued on beta blocker and angiotensin receptor blocker for LV dysfunction as well as statin.  Shortly after discharge, he was having shortness of breath and orthopnea, nervous to lay down. This seems to be improving. When laying flat felt like could not get enough air. Now improved. No trouble.  He also experienced some diarrhea and wheezing. This may have been upper respiratory/viral illness.  Minimal residual coronary artery disease in right coronary as well as left anterior descending.   Past Medical History  Diagnosis Date  . CAD (coronary artery disease)     a. 2001 s/p post MI- multiple PCI's to LCX/OM;  b. 2003 DES to OM;  c. 05/2012 MV: postlat infarct w/ mild peri-infarct ischemia. nl EF->Med Rx.  . Pain, foot, chronic   . Cough   . Allergic rhinitis   . Pes planus   . Metatarsalgia   . History of GI diverticular bleed     a. 05/2013 and 02/2014 - taken off of ASA for this reason.  . Essential hypertension, benign   . History of atrial fibrillation   . RLS (restless legs syndrome)   . ED (erectile dysfunction)   . Moderate mitral insufficiency     a. 10/2014 Echo: EF 55-60%, Gr1 DD, mild AS, mod MR, sev dil LA, nl RV, mildly dil RA, mild TR, PASP 13mmHg.  Marland Kitchen Pure hypercholesterolemia   . MI (myocardial infarction) 2001  . Esophageal reflux     "slight"  . Arthritis     "thumbs primarily" (05/05/2015)  . Prostate cancer 2008    S/P "radiation and seed implants"  . Basal cell carcinoma, face     "have had several; cut and burned off"  . 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 Presence of permanent cardiac pacemaker     Past Surgical History  Procedure Laterality Date  . Foot arthrodesis, triple Right 2007  . Esternal beam xrt and seeds for prostate cancer    . Colonoscopy w/ polypectomy    . Coronary angioplasty with stent placement  04/2000; ? ~ 2002; 01/2002  . Permanent pacemaker insertion N/A 11/22/2014    Procedure: PERMANENT PACEMAKER INSERTION;  Surgeon: [name], MD;  Location[facility] CATH LAB;  Service: Cardiovascular;  Laterality: N/A;  . Cardiac catheterization N/A 05/05/2015    Procedure: Left Heart Cath and Coronary Angiography;  Surgeon: Belva Crome, MD;  Location: [place] CV LAB;  Service: Cardiovascular;  Laterality: N/A;  . Cardiac catheterization N/A 05/05/2015    Procedure: Coronary Stent Intervention;  Surgeon: Belva Crome, MD;  Location: [place] CV LAB;  Service: Cardiovascular;  Laterality: N/A;  . Insert / replace / remove pacemaker    . Tonsillectomy  1942  . Appendectomy  1956  . Coronary angioplasty    . Cataract extraction w/ intraocular lens implant Left 12/2014  . Basal cell carcinoma excision      "face"  .  Prostate biopsy    . Ganglion cyst excision Right 1970's     Current Outpatient Prescriptions  Medication Sig Dispense Refill  . aspirin EC 81 MG tablet Take 1 tablet (81 mg total) by mouth daily.    Marland Kitchen atorvastatin (LIPITOR) 40 MG tablet Take 40 mg by mouth 2 (two) times daily.    . carvedilol (COREG) 6.25 MG tablet Take 1 tablet (6.25 mg total) by mouth 2 (two) times daily. 60 tablet 6  . CHLORPHENIRAMINE MALEATE PO Take 4 mg by mouth every 6 (six) hours as needed (for congestion). 3-4 tablets daily.    . Coenzyme Q-10 100 MG capsule Take 100 mg by mouth daily.     . famotidine (PEPCID) 20 MG tablet Take 20 mg by mouth daily as needed for heartburn or  indigestion.     . Multiple Vitamin (MULTIVITAMIN) tablet Take 1 tablet by mouth 3 (three) times a week.     . nitroGLYCERIN (NITROSTAT) 0.4 MG SL tablet Place 0.4 mg under the tongue every 5 (five) minutes as needed for chest pain (MAX 3 TABLETS).     . Resveratrol 250 MG CAPS Take 1 capsule by mouth daily.    . sildenafil (VIAGRA) 100 MG tablet Take 100 mg by mouth daily as needed for erectile dysfunction.     . ticagrelor (BRILINTA) 90 MG TABS tablet Take 90 mg by mouth 2 (two) times daily.    . valsartan (DIOVAN) 160 MG tablet Take 1 tablet (160 mg total) by mouth 2 (two) times daily. 180 tablet 3   No current facility-administered medications for this visit.    Allergies:   Ambien and Erythromycin    Social History:  The patient  reports that he quit smoking about 15 years ago. His smoking use included Cigarettes. He has a 25 pack-year smoking history. He has never used smokeless tobacco. He reports that he drinks about 18.0 oz of alcohol per week. He reports that he does not use illicit drugs.   Family History:  The patient's family history includes Breast cancer in his mother; Heart disease in his father.    ROS:  Please see the history of present illness.   Otherwise, review of systems are positive for none.   All other systems are reviewed and negative.    PHYSICAL EXAM: VS:  BP 150/52 mmHg  Pulse 64  Ht 5' 9.5" (1.765 m)  Wt 185 lb (83.915 kg)  BMI 26.94 kg/m2 , BMI Body mass index is 26.94 kg/(m^2). GEN: Well nourished, well developed, in no acute distress HEENT: normal Neck: no JVD, carotid bruits, or masses Cardiac: RRR; no murmurs, rubs, or gallops,no edema  Respiratory:  clear to auscultation bilaterally, normal work of breathing GI: soft, nontender, nondistended, + BS MS: no deformity or atrophy Skin: warm and dry, no rash Neuro:  Strength and sensation are intact Psych: euthymic mood, full affect   EKG:  EKG is not ordered today.    Recent  Labs: 11/22/2014: Magnesium 2.2; TSH 1.821 05/06/2015: BUN 15; Creatinine, Ser 0.99; Hemoglobin 13.3; Platelets 158; Potassium 4.0; Sodium 136    Lipid Panel    Component Value Date/Time   CHOL 147 04/12/2014 0951   TRIG 109.0 04/12/2014 0951   HDL 44.80 04/12/2014 0951   CHOLHDL 3 04/12/2014 0951   VLDL 21.8 04/12/2014 0951   LDLCALC 80 04/12/2014 0951      Wt Readings from Last 3 Encounters:  05/25/15 185 lb (83.915 kg)  05/10/15 181 lb (82.101 kg)  05/06/15 181 lb 9.6 oz (82.373 kg)      Other studies Reviewed: Additional studies/ records that were reviewed today include: Hospital records reviewed, lab work, EKG, cath report. Review of the above records demonstrates: As above   ASSESSMENT AND PLAN:  1.  CAD/unstable angina- 05/05/15- stent placementsymptoms of shortness of breath/orthopnea have resolved/improved post stent placement. We will continue with his Brilinta as prescribed. No need for Lasix at this point. Continue gentle exercise. Several questions answered.  2. Pacemaker-protecting against bradycardia, working well  3. Hyperlipidemia-continue with high intensity statin, atorvastatin 40 mg. LDL goal less than 70.  4. Essential hypertension-blood pressure remains elevated. 150 range. We will increase his carvedilol once again to 12.5  g twice a day.  5. Mild left ventricular dysfunction-EF 45%. Continue beta blocker, angiotensin receptor blocker. Hypertension control.   6. I'm fine with him waiting 6 months before cataract surgery.   Current medicines are reviewed at length with the patient today.  The patient does not have concerns regarding medicines  The following changes have been made:  Increase coreg to 12.5 bid  Labs/ tests ordered today include:  No orders of the defined types were placed in this encounter.     Disposition:   FU with Skans in 3 months  Signed, Candee Furbish, MD  05/25/2015 4:22 PM    [facility] [address], [place], [place]  [address] Phone: [phone]; Fax: [phone]

## 2015-05-25 NOTE — Telephone Encounter (Signed)
LMOVM for pt to return my call.  

## 2015-05-25 NOTE — Patient Instructions (Signed)
Medication Instructions:  Please increase Carvedilol to 12.5 mg twice a day. Continue all other medications as listed.  Follow-Up: Follow up in December 2016 with Dr Marlou Porch.  Thank you for choosing [facility]!!

## 2015-05-26 NOTE — Telephone Encounter (Signed)
Spoke w/ pt and informed him that his transmission should be automatic and check him while he sleeps between midnight and 5 AM. Pt also aware that if we do not have the remote transmission by Noon that day we will call and ask him how to send a manual transmission. Informed pt today on how to do this. Pt verbalized understanding.

## 2015-05-31 ENCOUNTER — Ambulatory Visit (INDEPENDENT_AMBULATORY_CARE_PROVIDER_SITE_OTHER): Payer: Medicare Other | Admitting: *Deleted

## 2015-05-31 ENCOUNTER — Encounter: Payer: Self-pay | Admitting: Internal Medicine

## 2015-05-31 DIAGNOSIS — I442 Atrioventricular block, complete: Secondary | ICD-10-CM | POA: Diagnosis not present

## 2015-05-31 NOTE — Progress Notes (Signed)
Remote pacemaker transmission.   

## 2015-06-02 LAB — CUP PACEART REMOTE DEVICE CHECK
Battery Remaining Longevity: 128 mo
Battery Remaining Percentage: 95.5 %
Battery Voltage: 3.01 V
Brady Statistic AP VP Percent: 35 %
Brady Statistic AP VS Percent: 1 %
Brady Statistic AS VP Percent: 63 %
Brady Statistic AS VS Percent: 1 %
Brady Statistic RA Percent Paced: 34 %
Brady Statistic RV Percent Paced: 99 %
Date Time Interrogation Session: 20161005070951
Lead Channel Impedance Value: 430 Ohm
Lead Channel Impedance Value: 680 Ohm
Lead Channel Pacing Threshold Amplitude: 0.625 V
Lead Channel Pacing Threshold Amplitude: 0.625 V
Lead Channel Pacing Threshold Pulse Width: 0.4 ms
Lead Channel Pacing Threshold Pulse Width: 0.4 ms
Lead Channel Sensing Intrinsic Amplitude: 1.3 mV
Lead Channel Sensing Intrinsic Amplitude: 12 mV
Lead Channel Setting Pacing Amplitude: 0.875
Lead Channel Setting Pacing Amplitude: 1.625
Lead Channel Setting Pacing Pulse Width: 0.4 ms
Lead Channel Setting Sensing Sensitivity: 4 mV
Pulse Gen Model: 2240
Pulse Gen Serial Number: 7732903

## 2015-06-04 ENCOUNTER — Other Ambulatory Visit: Payer: Self-pay | Admitting: Cardiology

## 2015-07-09 DIAGNOSIS — Z23 Encounter for immunization: Secondary | ICD-10-CM | POA: Diagnosis not present

## 2015-07-12 ENCOUNTER — Telehealth: Payer: Self-pay | Admitting: Cardiology

## 2015-07-12 NOTE — Telephone Encounter (Signed)
Go to ER since he has not established with PCP.  Just had stent placed 05/05/15. Do not stop brilinta or ASA.  Candee Furbish, MD

## 2015-07-12 NOTE — Telephone Encounter (Signed)
Advised pt to go to ED for further evaluation.  Also advised to continue ASA and Brilinta per Dr Marlou Porch.  He states understanding but also states that he will probably wait to go because "in the past, when this has happened, it stopped on it's own."

## 2015-07-12 NOTE — Telephone Encounter (Signed)
Spoke with pt who reports he started passing blood per rectum about 2 am. He has since had 4 or 5 small BMs that are bloody stools but not liquid.  He states he has a history of GI X 2 in the past without being on any anticoagulation.  His GI MD is no longer practicing and he has not been established with his new PCP yet.  He is wanting to know if he should stop his Brilinta and/or ASA.  He has not taken either today.  Advised I will review with Dr Marlou Porch and call him back with further instructions.

## 2015-07-12 NOTE — Telephone Encounter (Signed)
New message     Pt c/o medication issue:  1. Name of Medication: brilinta 2. How are you currently taking this medication (dosage and times per day)? 90mg  bid 3. Are you having a reaction (difficulty breathing--STAT)? no 4. What is your medication issue?  Pt had blood in his stool last night.  Should he change his dosage

## 2015-07-12 NOTE — Telephone Encounter (Signed)
Go to ER since he has not established with PCP. Just had stent placed 05/05/15. Do not stop brilinta or ASA.   Candee Furbish, MD

## 2015-07-13 ENCOUNTER — Encounter: Payer: Self-pay | Admitting: Cardiology

## 2015-07-13 DIAGNOSIS — K922 Gastrointestinal hemorrhage, unspecified: Secondary | ICD-10-CM | POA: Diagnosis not present

## 2015-08-02 ENCOUNTER — Ambulatory Visit: Payer: Medicare Other | Admitting: Cardiology

## 2015-08-09 ENCOUNTER — Encounter: Payer: Self-pay | Admitting: Cardiology

## 2015-08-09 ENCOUNTER — Ambulatory Visit (INDEPENDENT_AMBULATORY_CARE_PROVIDER_SITE_OTHER): Payer: Medicare Other | Admitting: Cardiology

## 2015-08-09 ENCOUNTER — Ambulatory Visit (INDEPENDENT_AMBULATORY_CARE_PROVIDER_SITE_OTHER): Payer: Medicare Other | Admitting: *Deleted

## 2015-08-09 ENCOUNTER — Ambulatory Visit: Payer: Medicare Other | Admitting: Cardiology

## 2015-08-09 VITALS — BP 140/80 | HR 60 | Ht 69.5 in | Wt 182.1 lb

## 2015-08-09 DIAGNOSIS — R001 Bradycardia, unspecified: Secondary | ICD-10-CM | POA: Diagnosis not present

## 2015-08-09 DIAGNOSIS — I10 Essential (primary) hypertension: Secondary | ICD-10-CM

## 2015-08-09 DIAGNOSIS — I200 Unstable angina: Secondary | ICD-10-CM

## 2015-08-09 DIAGNOSIS — I340 Nonrheumatic mitral (valve) insufficiency: Secondary | ICD-10-CM

## 2015-08-09 DIAGNOSIS — Z950 Presence of cardiac pacemaker: Secondary | ICD-10-CM

## 2015-08-09 DIAGNOSIS — I252 Old myocardial infarction: Secondary | ICD-10-CM

## 2015-08-09 DIAGNOSIS — I2510 Atherosclerotic heart disease of native coronary artery without angina pectoris: Secondary | ICD-10-CM

## 2015-08-09 DIAGNOSIS — K625 Hemorrhage of anus and rectum: Secondary | ICD-10-CM

## 2015-08-09 DIAGNOSIS — I442 Atrioventricular block, complete: Secondary | ICD-10-CM

## 2015-08-09 LAB — CUP PACEART INCLINIC DEVICE CHECK
Battery Remaining Longevity: 117.6
Battery Voltage: 3.01 V
Brady Statistic RA Percent Paced: 52 %
Brady Statistic RV Percent Paced: 95 %
Date Time Interrogation Session: 20161214163556
Implantable Lead Implant Date: 20160329
Implantable Lead Implant Date: 20160329
Implantable Lead Location: 753859
Implantable Lead Location: 753860
Implantable Lead Model: 1948
Lead Channel Impedance Value: 412.5 Ohm
Lead Channel Impedance Value: 700 Ohm
Lead Channel Pacing Threshold Amplitude: 0.5 V
Lead Channel Pacing Threshold Amplitude: 0.75 V
Lead Channel Pacing Threshold Pulse Width: 0.4 ms
Lead Channel Pacing Threshold Pulse Width: 0.4 ms
Lead Channel Sensing Intrinsic Amplitude: 1.7 mV
Lead Channel Sensing Intrinsic Amplitude: 12 mV
Lead Channel Setting Pacing Amplitude: 0.875
Lead Channel Setting Pacing Amplitude: 2.5 V
Lead Channel Setting Pacing Pulse Width: 0.4 ms
Lead Channel Setting Sensing Sensitivity: 2.5 mV
Pulse Gen Model: 2240
Pulse Gen Serial Number: 7732903

## 2015-08-09 MED ORDER — CLOPIDOGREL BISULFATE 75 MG PO TABS: ORAL_TABLET | ORAL | Status: DC

## 2015-08-09 NOTE — Progress Notes (Signed)
Pacemaker check in clinic. Normal device function. Thresholds, sensing, impedances consistent with previous measurements. 23 "PMT" EGMs, ?loss of RA capture per industry, ACap Confirm turned off, RA output fixed at 2.5V per industry recommendation. PMT detection rate reprogrammed from 120bpm to 130bpm. Device programmed to maximize longevity. 28 AMS episodes, 1 EGM, duration ~6sec, +Eliquis. No high ventricular rates noted. Device programmed at appropriate safety margins; RV sensitivity reprogrammed from 4.5mV to 2.32mV. Histogram distribution appropriate for patient activity level. Device programmed to optimize intrinsic conduction. Estimated longevity 9.7 years. Patient education completed. Merlin on 11/08/15 and ROV with AS in 02/2016.

## 2015-08-09 NOTE — Patient Instructions (Addendum)
Medication Instructions:   STOP taking Brilinta (take your last dose tonight)   Start taking Plavix (Clopidogrel) in place of Brilinta, tomorrow morning.   >> On day 1, take 4 tablets (300 mg) - take them all together.  >> On day 2, start taking 1 tablet daily (75 mg Once daily)    Labwork: None today.  Testing/Procedures: None   Follow-Up: Dr. Candee Furbish 6 months.  Any Other Special Instructions Will Be Listed Below (If Applicable).

## 2015-08-09 NOTE — Progress Notes (Signed)
Cardiology Office Note   Date:  08/09/2015   ID:  [name], DOB Jun 26, 1931, MRN [id number]  PCP:  Kandice Hams, MD  Cardiologist:   Candee Furbish, MD   Chief Complaint  Patient presents with  . Follow-up  . Cardiomyopathy      History of Present Illness: Andrew Marshall is a 81 y.o. male with hospitalization in 9/16 in the setting of unstable angina coronary artery disease, stenting of proximal circumflex within the treatment zone of prior bradycardia therapy in 2003. He was having unstable anginal symptoms. Ejection fraction was 45%, mildly reduced. He was placed on dual antiplatelets therapy with aspirin and Brilinta. Continued on beta blocker and angiotensin receptor blocker for LV dysfunction as well as statin.  Shortly after discharge, he was having shortness of breath and orthopnea, nervous to lay down. This seems to be improving. When laying flat felt like could not get enough air. Now improved. No trouble. He also experienced some diarrhea and wheezing. This may have been upper respiratory/viral illness.  Minimal residual coronary artery disease in right coronary as well as left anterior descending.  Since last seen, he had some blood in his stool.  He has had this before.  He had a recurrence 10 days ago.  He reviewed this on the internet. He denies any associated symptoms.  He used to cycle quite a bit.  He is still having dyspnea related to Brilinta he thinks and cannot cycle like he would like.  He is also concerned about his blood pressures running too high.  He also complains of diarrhea. He can FU with his PCP for this.    Past Medical History  Diagnosis Date  . CAD (coronary artery disease)     a. 2001 s/p post MI- multiple PCI's to LCX/OM;  b. 2003 DES to OM;  c. 05/2012 MV: postlat infarct w/ mild peri-infarct ischemia. nl EF->Med Rx.  . Pain, foot, chronic   . Cough   . Allergic rhinitis   . Pes planus   . Metatarsalgia   . History of GI diverticular  bleed     a. 05/2013 and 02/2014 - taken off of ASA for this reason.  . Essential hypertension, benign   . History of atrial fibrillation   . RLS (restless legs syndrome)   . ED (erectile dysfunction)   . Moderate mitral insufficiency     a. 10/2014 Echo: EF 55-60%, Gr1 DD, mild AS, mod MR, sev dil LA, nl RV, mildly dil RA, mild TR, PASP 76mmHg.  Marland Kitchen Pure hypercholesterolemia   . MI (myocardial infarction) ([place]) 2001  . Esophageal reflux     "slight"  . Arthritis     "thumbs primarily" (05/05/2015)  . Prostate cancer ([place]) 2008    S/P "radiation and seed implants"  . Basal cell carcinoma, face     "have had several; cut and burned off"  . 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 Presence of permanent cardiac pacemaker     Past Surgical History  Procedure Laterality Date  . Foot arthrodesis, triple Right 2007  . Esternal beam xrt and seeds for prostate cancer    . Colonoscopy w/ polypectomy    . Coronary angioplasty with stent placement  04/2000; ? ~ 2002; 01/2002  . Permanent pacemaker insertion N/A 11/22/2014    Procedure: PERMANENT PACEMAKER INSERTION;  Surgeon: [name], MD;  Location: [facility] CATH LAB;  Service: Cardiovascular;  Laterality: N/A;  . Cardiac catheterization N/A  05/05/2015    Procedure: Left Heart Cath and Coronary Angiography;  Surgeon: Belva Crome, MD;  Location: [facility] CV LAB;  Service: Cardiovascular;  Laterality: N/A;  . Cardiac catheterization N/A 05/05/2015    Procedure: Coronary Stent Intervention;  Surgeon: Belva Crome, MD;  Location: [place] CV LAB;  Service: Cardiovascular;  Laterality: N/A;  . Insert / replace / remove pacemaker    . Tonsillectomy  1942  . Appendectomy  1956  . Coronary angioplasty    . Cataract extraction w/ intraocular lens implant Left 12/2014  . Basal cell carcinoma excision      "face"  . Prostate biopsy    . Ganglion cyst excision Right 1970's    Current Outpatient Prescriptions  Medication Sig Dispense  Refill  . aspirin EC 81 MG tablet Take 1 tablet (81 mg total) by mouth daily.    Marland Kitchen atorvastatin (LIPITOR) 40 MG tablet TAKE 1 TABLET (40 MG TOTAL) BY MOUTH DAILY. 90 tablet 1  . carvedilol (COREG) 12.5 MG tablet Take 1 tablet (12.5 mg total) by mouth 2 (two) times daily. 60 tablet 6  . Coenzyme Q-10 100 MG capsule Take 100 mg by mouth daily.     . famotidine (PEPCID) 20 MG tablet Take 20 mg by mouth daily as needed for heartburn or indigestion.     . Multiple Vitamin (MULTIVITAMIN) tablet Take 1 tablet by mouth 3 (three) times a week.     . nitroGLYCERIN (NITROSTAT) 0.4 MG SL tablet Place 0.4 mg under the tongue every 5 (five) minutes as needed for chest pain (MAX 3 TABLETS).     . Resveratrol 250 MG CAPS Take 1 capsule by mouth daily.    . sildenafil (VIAGRA) 100 MG tablet Take 100 mg by mouth daily as needed for erectile dysfunction.     . ticagrelor (BRILINTA) 90 MG TABS tablet Take 90 mg by mouth 2 (two) times daily.    . valsartan (DIOVAN) 160 MG tablet Take 1 tablet (160 mg total) by mouth 2 (two) times daily. 180 tablet 3  . clopidogrel (PLAVIX) 75 MG tablet On day 1, take 300 mg (4 tablets).  Then on day 2, start taking 1 tablet daily. 90 tablet 3   No current facility-administered medications for this visit.    Allergies:   Ambien and Erythromycin   Social History:  The patient  reports that he quit smoking about 15 years ago. His smoking use included Cigarettes. He has a 25 pack-year smoking history. He has never used smokeless tobacco. He reports that he drinks about 18.0 oz of alcohol per week. He reports that he does not use illicit drugs.   Family History:  The patient's family history includes Breast cancer in his mother; Heart disease in his father.   ROS:   Please see the history of present illness.    Review of Systems  Gastrointestinal: Positive for diarrhea and hematochezia.  All other systems reviewed and are negative.   PHYSICAL EXAM: VS:  BP 140/80 mmHg  Pulse  60  Ht 5' 9.5" (1.765 m)  Wt 182 lb 1.9 oz (82.609 kg)  BMI 26.52 kg/m2  SpO2 98% , Body mass index is 26.52 kg/(m^2). GEN: Well nourished, well developed, in no acute distress HEENT: normal Neck: no JVD, carotid bruits, or masses Cardiac: RRR; no murmurs, rubs, or gallops,no edema, good distal pulses, pacer Respiratory:  clear to auscultation bilaterally, normal work of breathing GI: soft, nontender, nondistended, + BS MS: no deformity or  atrophy Skin: warm and dry, no rash Neuro:  Alert and Oriented x 3, Strength and sensation are intact Psych: euthymic mood, full affect   EKG:   None today  Recent Labs: 11/22/2014: Magnesium 2.2; TSH 1.821 05/06/2015: BUN 15; Creatinine, Ser 0.99; Hemoglobin 13.3; Platelets 158; Potassium 4.0; Sodium 136    Lipid Panel    Component Value Date/Time   CHOL 147 04/12/2014 0951   TRIG 109.0 04/12/2014 0951   HDL 44.80 04/12/2014 0951   CHOLHDL 3 04/12/2014 0951   VLDL 21.8 04/12/2014 0951   LDLCALC 80 04/12/2014 0951      Wt Readings from Last 3 Encounters:  08/09/15 182 lb 1.9 oz (82.609 kg)  05/25/15 185 lb (83.915 kg)  05/10/15 181 lb (82.101 kg)      Other studies Reviewed: Additional studies/ records that were reviewed today include:  Catheterization, lab work, office notes. Review of the above records demonstrates: as above   ASSESSMENT AND PLAN:  1. Coronary artery disease involving native coronary artery of native heart without angina pectoris   - Overall without any exertional anginal symptoms.   - He is having some mild shortness of breath that he still is attributing to Brilinta.   - Given stent placement 05/05/15, we will transition him over to Plavix, load him with 375 thereafter. Hopefully this will help with this sensation.   - Continue beta blocker   2. Essential hypertension   - Blood pressure mildly elevated at times at home ranging from [id number] to [id number] systolic. Overall reasonable control.   3. Old MI (myocardial  infarction)   - Circumflex stent placed 05/05/15   4. Moderate mitral insufficiency   - Noted on echocardiogram 11/22/14.   5. Pacemaker   -  Structural rhythm was noted on EKG interrogation on 08/09/15, heart rate 120 to 1:30 range. This was discussed with electrophysiology. Appreciate their assistance. We will continue with current carvedilol. Prior complete heart block. He is asymptomatic with this.    6.       Bright red blood per rectum - had very infrequent episodes of this. He continued with his dual antiplatelet therapy throughout these experiences. I've encouraged him to discuss this with his primary, Dr. Delfina Redwood and potentially seek assistance from gastroenterology. He has not had any in explain weight loss.      Medication Adjustments/Labs and Tests Ordered: Current medicines are reviewed at length with the patient today.  Concerns regarding medicines are outlined above.  Labs/Tests ordered today are listed below. Patient Instructions  Medication Instructions:   STOP taking Brilinta (take your last dose tonight)   Start taking Plavix (Clopidogrel) in place of Brilinta, tomorrow morning.   >> On day 1, take 4 tablets (300 mg) - take them all together.  >> On day 2, start taking 1 tablet daily (75 mg Once daily)    Labwork: None today.  Testing/Procedures: None   Follow-Up: Dr. Candee Furbish 6 months.  Any Other Special Instructions Will Be Listed Below (If Applicable).       Bobby Rumpf, MD  08/09/2015 2:40 PM    [facility] [address], [place], [place]  [address] Phone: [phone]; Fax: [phone]

## 2015-08-10 DIAGNOSIS — I10 Essential (primary) hypertension: Secondary | ICD-10-CM | POA: Diagnosis not present

## 2015-08-10 DIAGNOSIS — I4891 Unspecified atrial fibrillation: Secondary | ICD-10-CM | POA: Diagnosis not present

## 2015-08-10 DIAGNOSIS — E7800 Pure hypercholesterolemia, unspecified: Secondary | ICD-10-CM | POA: Diagnosis not present

## 2015-08-10 DIAGNOSIS — R197 Diarrhea, unspecified: Secondary | ICD-10-CM | POA: Diagnosis not present

## 2015-08-18 ENCOUNTER — Other Ambulatory Visit: Payer: Self-pay | Admitting: Cardiology

## 2015-08-27 HISTORY — PX: INSERT / REPLACE / REMOVE PACEMAKER: SUR710

## 2015-08-30 ENCOUNTER — Ambulatory Visit: Payer: Medicare Other

## 2015-10-12 DIAGNOSIS — Z0000 Encounter for general adult medical examination without abnormal findings: Secondary | ICD-10-CM | POA: Diagnosis not present

## 2015-10-12 DIAGNOSIS — Z1389 Encounter for screening for other disorder: Secondary | ICD-10-CM | POA: Diagnosis not present

## 2015-10-30 ENCOUNTER — Other Ambulatory Visit: Payer: Self-pay | Admitting: Cardiology

## 2015-11-08 ENCOUNTER — Ambulatory Visit (INDEPENDENT_AMBULATORY_CARE_PROVIDER_SITE_OTHER): Payer: Medicare Other | Admitting: *Deleted

## 2015-11-08 DIAGNOSIS — R001 Bradycardia, unspecified: Secondary | ICD-10-CM | POA: Diagnosis not present

## 2015-11-08 DIAGNOSIS — I442 Atrioventricular block, complete: Secondary | ICD-10-CM | POA: Diagnosis not present

## 2015-11-08 LAB — CUP PACEART REMOTE DEVICE CHECK
Battery Remaining Longevity: 111 mo
Battery Remaining Percentage: 95.5 %
Battery Voltage: 2.99 V
Brady Statistic AP VP Percent: 75 %
Brady Statistic AP VS Percent: 5.4 %
Brady Statistic AS VP Percent: 4 %
Brady Statistic AS VS Percent: 13 %
Brady Statistic RA Percent Paced: 78 %
Brady Statistic RV Percent Paced: 79 %
Date Time Interrogation Session: 20170315060014
Implantable Lead Implant Date: 20160329
Implantable Lead Implant Date: 20160329
Implantable Lead Location: 753859
Implantable Lead Location: 753860
Implantable Lead Model: 1948
Lead Channel Impedance Value: 430 Ohm
Lead Channel Impedance Value: 730 Ohm
Lead Channel Pacing Threshold Amplitude: 0.75 V
Lead Channel Pacing Threshold Pulse Width: 0.4 ms
Lead Channel Sensing Intrinsic Amplitude: 12 mV
Lead Channel Sensing Intrinsic Amplitude: 2.1 mV
Lead Channel Setting Pacing Amplitude: 1 V
Lead Channel Setting Pacing Amplitude: 2.5 V
Lead Channel Setting Pacing Pulse Width: 0.4 ms
Lead Channel Setting Sensing Sensitivity: 2.5 mV
Pulse Gen Model: 2240
Pulse Gen Serial Number: 7732903

## 2015-11-08 NOTE — Progress Notes (Signed)
Remote pacemaker transmission.   

## 2015-12-01 ENCOUNTER — Telehealth: Payer: Self-pay | Admitting: Cardiology

## 2015-12-01 DIAGNOSIS — I2510 Atherosclerotic heart disease of native coronary artery without angina pectoris: Secondary | ICD-10-CM

## 2015-12-01 NOTE — Telephone Encounter (Signed)
New message      Calling to see if Dr Marlou Porch looked at the CD of his carotid arteries.  Please call

## 2015-12-01 NOTE — Telephone Encounter (Signed)
Dr Marlou Porch reviewed information from pt/dentist.  He gave verbal orders to have carotid doppler.  Pt is aware and will be expecting a call to schedule an appt for the testing to be completed.  Order placed in EPIC.

## 2015-12-06 ENCOUNTER — Ambulatory Visit (HOSPITAL_COMMUNITY)
Admission: RE | Admit: 2015-12-06 | Discharge: 2015-12-06 | Disposition: A | Discharge status: Home / Self Care | Payer: Medicare Other | Source: Ambulatory Visit | Attending: Cardiology | Admitting: Cardiology

## 2015-12-06 DIAGNOSIS — I2510 Atherosclerotic heart disease of native coronary artery without angina pectoris: Secondary | ICD-10-CM

## 2015-12-07 ENCOUNTER — Other Ambulatory Visit: Payer: Self-pay | Admitting: Cardiology

## 2015-12-07 ENCOUNTER — Inpatient Hospital Stay (HOSPITAL_COMMUNITY): Admission: RE | Admit: 2015-12-07 | Payer: Medicare Other | Source: Ambulatory Visit

## 2015-12-07 ENCOUNTER — Ambulatory Visit (HOSPITAL_COMMUNITY)
Admission: RE | Admit: 2015-12-07 | Discharge: 2015-12-07 | Disposition: A | Discharge status: Home / Self Care | Payer: Medicare Other | Source: Ambulatory Visit | Attending: Cardiology | Admitting: Cardiology

## 2015-12-07 DIAGNOSIS — I6529 Occlusion and stenosis of unspecified carotid artery: Secondary | ICD-10-CM

## 2015-12-07 DIAGNOSIS — I10 Essential (primary) hypertension: Secondary | ICD-10-CM | POA: Insufficient documentation

## 2015-12-07 DIAGNOSIS — I6523 Occlusion and stenosis of bilateral carotid arteries: Secondary | ICD-10-CM | POA: Insufficient documentation

## 2015-12-07 DIAGNOSIS — E785 Hyperlipidemia, unspecified: Secondary | ICD-10-CM | POA: Diagnosis not present

## 2015-12-11 DIAGNOSIS — H6123 Impacted cerumen, bilateral: Secondary | ICD-10-CM | POA: Diagnosis not present

## 2015-12-11 DIAGNOSIS — H908 Mixed conductive and sensorineural hearing loss, unspecified: Secondary | ICD-10-CM | POA: Insufficient documentation

## 2015-12-11 DIAGNOSIS — H7293 Unspecified perforation of tympanic membrane, bilateral: Secondary | ICD-10-CM | POA: Insufficient documentation

## 2015-12-12 ENCOUNTER — Encounter: Payer: Self-pay | Admitting: Cardiology

## 2015-12-18 ENCOUNTER — Encounter: Payer: Self-pay | Admitting: Cardiology

## 2015-12-18 DIAGNOSIS — I119 Hypertensive heart disease without heart failure: Secondary | ICD-10-CM | POA: Diagnosis not present

## 2015-12-18 DIAGNOSIS — I2510 Atherosclerotic heart disease of native coronary artery without angina pectoris: Secondary | ICD-10-CM | POA: Diagnosis not present

## 2015-12-18 DIAGNOSIS — Z955 Presence of coronary angioplasty implant and graft: Secondary | ICD-10-CM | POA: Diagnosis not present

## 2015-12-18 DIAGNOSIS — E785 Hyperlipidemia, unspecified: Secondary | ICD-10-CM | POA: Diagnosis not present

## 2015-12-18 DIAGNOSIS — Z950 Presence of cardiac pacemaker: Secondary | ICD-10-CM | POA: Diagnosis not present

## 2015-12-22 DIAGNOSIS — M545 Low back pain: Secondary | ICD-10-CM | POA: Diagnosis not present

## 2016-01-08 ENCOUNTER — Other Ambulatory Visit: Payer: Self-pay | Admitting: Cardiology

## 2016-01-08 NOTE — Telephone Encounter (Signed)
REFILL 

## 2016-01-18 DIAGNOSIS — R490 Dysphonia: Secondary | ICD-10-CM | POA: Insufficient documentation

## 2016-01-18 DIAGNOSIS — J383 Other diseases of vocal cords: Secondary | ICD-10-CM | POA: Diagnosis not present

## 2016-01-25 DIAGNOSIS — L400 Psoriasis vulgaris: Secondary | ICD-10-CM | POA: Diagnosis not present

## 2016-01-25 DIAGNOSIS — L218 Other seborrheic dermatitis: Secondary | ICD-10-CM | POA: Diagnosis not present

## 2016-01-25 DIAGNOSIS — D692 Other nonthrombocytopenic purpura: Secondary | ICD-10-CM | POA: Diagnosis not present

## 2016-01-25 DIAGNOSIS — Z85828 Personal history of other malignant neoplasm of skin: Secondary | ICD-10-CM | POA: Diagnosis not present

## 2016-01-25 DIAGNOSIS — L570 Actinic keratosis: Secondary | ICD-10-CM | POA: Diagnosis not present

## 2016-01-25 DIAGNOSIS — D1801 Hemangioma of skin and subcutaneous tissue: Secondary | ICD-10-CM | POA: Diagnosis not present

## 2016-01-29 DIAGNOSIS — I119 Hypertensive heart disease without heart failure: Secondary | ICD-10-CM | POA: Diagnosis not present

## 2016-01-29 DIAGNOSIS — E785 Hyperlipidemia, unspecified: Secondary | ICD-10-CM | POA: Diagnosis not present

## 2016-01-29 DIAGNOSIS — I2510 Atherosclerotic heart disease of native coronary artery without angina pectoris: Secondary | ICD-10-CM | POA: Diagnosis not present

## 2016-01-29 DIAGNOSIS — Z955 Presence of coronary angioplasty implant and graft: Secondary | ICD-10-CM | POA: Diagnosis not present

## 2016-01-29 DIAGNOSIS — Z950 Presence of cardiac pacemaker: Secondary | ICD-10-CM | POA: Diagnosis not present

## 2016-01-29 DIAGNOSIS — I252 Old myocardial infarction: Secondary | ICD-10-CM | POA: Diagnosis not present

## 2016-02-02 DIAGNOSIS — M545 Low back pain: Secondary | ICD-10-CM | POA: Diagnosis not present

## 2016-02-04 ENCOUNTER — Other Ambulatory Visit: Payer: Self-pay | Admitting: Cardiology

## 2016-02-08 ENCOUNTER — Ambulatory Visit: Payer: Medicare Other | Admitting: Cardiology

## 2016-03-01 ENCOUNTER — Other Ambulatory Visit: Payer: Self-pay | Admitting: Cardiology

## 2016-03-07 ENCOUNTER — Ambulatory Visit (INDEPENDENT_AMBULATORY_CARE_PROVIDER_SITE_OTHER): Payer: Medicare Other | Admitting: Nurse Practitioner

## 2016-03-07 ENCOUNTER — Encounter: Payer: Self-pay | Admitting: Nurse Practitioner

## 2016-03-07 VITALS — BP 146/98 | HR 59 | Ht 69.5 in | Wt 178.0 lb

## 2016-03-07 DIAGNOSIS — I10 Essential (primary) hypertension: Secondary | ICD-10-CM | POA: Diagnosis not present

## 2016-03-07 DIAGNOSIS — I6529 Occlusion and stenosis of unspecified carotid artery: Secondary | ICD-10-CM

## 2016-03-07 DIAGNOSIS — I25118 Atherosclerotic heart disease of native coronary artery with other forms of angina pectoris: Secondary | ICD-10-CM

## 2016-03-07 DIAGNOSIS — I442 Atrioventricular block, complete: Secondary | ICD-10-CM | POA: Diagnosis not present

## 2016-03-07 DIAGNOSIS — I471 Supraventricular tachycardia: Secondary | ICD-10-CM | POA: Diagnosis not present

## 2016-03-07 LAB — CUP PACEART INCLINIC DEVICE CHECK
Date Time Interrogation Session: 20170713155232
Implantable Lead Implant Date: 20160329
Implantable Lead Implant Date: 20160329
Implantable Lead Location: 753859
Implantable Lead Location: 753860
Implantable Lead Model: 1948
Pulse Gen Model: 2240
Pulse Gen Serial Number: 7732903

## 2016-03-07 MED ORDER — NITROGLYCERIN 0.4 MG SL SUBL: 0.4000 mg | SUBLINGUAL_TABLET | SUBLINGUAL | Status: DC | PRN

## 2016-03-07 NOTE — Patient Instructions (Addendum)
Medication Instructions:   Your physician recommends that you continue on your current medications as directed. Please refer to the Current Medication list given to you today.   If you need a refill on your cardiac medications before your next appointment, please call your pharmacy.  Labwork: NONE ORDER TODAY    Testing/Procedures:  NONE ORDER TODAY'    Follow-Up: Remote monitoring is used to monitor your Pacemaker of ICD from home. This monitoring reduces the number of office visits required to check your device to one time per year. It allows [place] to keep an eye on the functioning of your device to ensure it is working properly. You are scheduled for a device check from home on . ..06/07/2016..You may send your transmission at any time that day. If you have a wireless device, the transmission will be sent automatically. After your physician reviews your transmission, you will receive a postcard with your next transmission date.  Your physician wants you to follow-up in: [place] will receive a reminder letter in the mail two months in advance. If you don't receive a letter, please call our office to schedule the follow-up appointment.     Any Other Special Instructions Will Be Listed Below (If Applicable).

## 2016-03-07 NOTE — Progress Notes (Signed)
Electrophysiology Office Note Date: 03/07/2016  ID:  Andrew [place], DOB [birth year], MRN [id number]  PCP: Andrew Hams, MD Primary Cardiologist: Andrew Marshall Electrophysiologist: Allred  CC: Pacemaker follow-up  Andrew [place] is a 82 y.o. male seen today for Dr Andrew [place].  He presents today for routine electrophysiology followup.  Since last being seen in our clinic, the patient reports doing very well. He remains very active cycling but has been doing less lately because of the heat.  He denies chest pain, palpitations, dyspnea, PND, orthopnea, nausea, vomiting, dizziness, syncope, edema, weight gain, or early satiety.  Device History: STJ dual chamber PPM implanted 2016 for complete heart block    Past Medical History  Diagnosis Date  . CAD (coronary artery disease)     a. 2001 s/p post MI- multiple PCI's to LCX/OM;  b. 2003 DES to OM;  c. 05/2012 MV: postlat infarct w/ mild peri-infarct ischemia. nl EF->Med Rx.  . Pain, foot, chronic   . Allergic rhinitis   . Pes planus   . Metatarsalgia   . History of GI diverticular bleed     a. 05/2013 and 02/2014 - taken off of ASA for this reason.  . Essential hypertension, benign   . History of atrial fibrillation   . RLS (restless legs syndrome)   . ED (erectile dysfunction)   . Moderate mitral insufficiency     a. 10/2014 Echo: EF 55-60%, Gr1 DD, mild AS, mod MR, sev dil LA, nl RV, mildly dil RA, mild TR, PASP 57mmHg.  Marland Kitchen Pure hypercholesterolemia   . MI (myocardial infarction) ([place]) 2001  . Esophageal reflux     "slight"  . Arthritis     "thumbs primarily" (05/05/2015)  . Prostate cancer ([place]) 2008    S/P "radiation and seed implants"  . Basal cell carcinoma, face     "have had several; cut and burned off"  . 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 Presence of permanent cardiac pacemaker    Past Surgical History  Procedure Laterality Date  . Foot arthrodesis, triple Right 2007  . Esternal beam xrt and seeds for  prostate cancer    . Colonoscopy w/ polypectomy    . Coronary angioplasty with stent placement  04/2000; ? ~ 2002; 01/2002  . Permanent pacemaker insertion N/A 11/22/2014    Procedure: PERMANENT PACEMAKER INSERTION;  Surgeon: [name], MD;  Location: [facility] CATH LAB;  Service: Cardiovascular;  Laterality: N/A;  . Cardiac catheterization N/A 05/05/2015    Procedure: Left Heart Cath and Coronary Angiography;  Surgeon: Belva Crome, MD;  Location: [place] CV LAB;  Service: Cardiovascular;  Laterality: N/A;  . Cardiac catheterization N/A 05/05/2015    Procedure: Coronary Stent Intervention;  Surgeon: Belva Crome, MD;  Location: [place] CV LAB;  Service: Cardiovascular;  Laterality: N/A;  . Insert / replace / remove pacemaker    . Tonsillectomy  1942  . Appendectomy  1956  . Coronary angioplasty    . Cataract extraction w/ intraocular lens implant Left 12/2014  . Basal cell carcinoma excision      "face"  . Prostate biopsy    . Ganglion cyst excision Right 1970's    Current Outpatient Prescriptions  Medication Sig Dispense Refill  . aspirin EC 81 MG tablet Take 1 tablet (81 mg total) by mouth daily.    Marland Kitchen atorvastatin (LIPITOR) 40 MG tablet TAKE 1 TABLET (40 MG TOTAL) BY MOUTH DAILY. 90 tablet 1  . carvedilol (COREG) 12.5  MG tablet TAKE 1 TABLET (12.5 MG TOTAL) BY MOUTH 2 (TWO) TIMES DAILY. 60 tablet 5  . clopidogrel (PLAVIX) 75 MG tablet On day 1, take 300 mg (4 tablets).  Then on day 2, start taking 1 tablet daily. 90 tablet 3  . Coenzyme Q-10 100 MG capsule Take 100 mg by mouth daily.     . famotidine (PEPCID) 20 MG tablet Take 20 mg by mouth daily as needed for heartburn or indigestion.     . Multiple Vitamin (MULTIVITAMIN) tablet Take 1 tablet by mouth 3 (three) times a week.     . nitroGLYCERIN (NITROSTAT) 0.4 MG SL tablet Place 1 tablet (0.4 mg total) under the tongue every 5 (five) minutes as needed for chest pain (MAX 3 TABLETS). 25 tablet 3  . Resveratrol 250 MG CAPS Take 1  capsule by mouth daily.    . sildenafil (VIAGRA) 100 MG tablet Take 100 mg by mouth daily as needed for erectile dysfunction.     . valsartan (DIOVAN) 160 MG tablet Take 1 tablet (160 mg total) by mouth daily. Please call and schedule a six month follow up appointment per last office visit 60 tablet 5   No current facility-administered medications for this visit.    Allergies:   Ambien and Erythromycin   Social History: Social History   Social History  . Marital Status: Married    Spouse Name: N/A  . Number of Children: N/A  . Years of Education: N/A   Occupational History  . Not on file.   Social History Main Topics  . Smoking status: Former Smoker -- 0.50 packs/day for 50 years    Types: Cigarettes    Quit date: 05/22/2000  . Smokeless tobacco: Never Used  . Alcohol Use: 18.0 oz/week    30 Glasses of wine per week     Comment: 05/05/2015 "3-6 glasses of wine/day"  . Drug Use: No  . Sexual Activity: Yes   Other Topics Concern  . Not on file   Social History Narrative    Family History: Family History  Problem Relation Age of Onset  . Breast cancer Mother   . Heart disease Father      Review of Systems: All other systems reviewed and are otherwise negative except as noted above.   Physical Exam: VS:  BP 146/98 mmHg  Pulse 59  Ht 5' 9.5" (1.765 m)  Wt 178 lb (80.74 kg)  BMI 25.92 kg/m2 , BMI Body mass index is 25.92 kg/(m^2).  GEN- The patient is elderly appearing, alert and oriented x 3 today.   HEENT: normocephalic, atraumatic; sclera clear, conjunctiva pink; hearing intact; oropharynx clear; neck supple  Lungs- Clear to ausculation bilaterally, normal work of breathing.  No wheezes, rales, rhonchi Heart- Regular rate and rhythm, no murmurs, rubs or gallops  GI- soft, non-tender, non-distended, bowel sounds present  Extremities- no clubbing, cyanosis, or edema; DP/PT/radial pulses 2+ bilaterally MS- no significant deformity or atrophy Skin- warm and dry,  no rash or lesion; PPM pocket well healed Psych- euthymic mood, full affect Neuro- strength and sensation are intact  PPM Interrogation- reviewed in detail today,  See PACEART report  EKG:  EKG is not ordered today.  Recent Labs: 05/06/2015: BUN 15; Creatinine, Ser 0.99; Hemoglobin 13.3; Platelets 158; Potassium 4.0; Sodium 136   Wt Readings from Last 3 Encounters:  03/07/16 178 lb (80.74 kg)  08/09/15 182 lb 1.9 oz (82.609 kg)  05/25/15 185 lb (83.915 kg)     Other studies  Reviewed: Additional studies/ records that were reviewed today include: Dr Andrew Marshall and Dr Jackalyn Lombard office notes  Assessment and Plan:  1.  Complete heart block Normal PPM function See Pace Art report Pt with increased atrial pacing resulting in chronotropic incompetence. I have turned rate response on today and had extensive discussion with patient about physiology and rationale.  He will monitor exercise tolerance and call with update in a couple of weeks.   2.  Atrial tachycardia Burden by device interrogation 0% No AF identified Continue to monitor remotely  3.  HTN Stable No change required today  4.  CAD S/p PTCA and stenting of prox circumflex 04/2015 Recommendations at that time prolonged DAPT No bleeding issues on Plavix/ASA   Current medicines are reviewed at length with the patient today.   The patient does not have concerns regarding his medicines.  The following changes were made today:  none  Labs/ tests ordered today include: none  No orders of the defined types were placed in this encounter.     Disposition:   Follow up with Merlin transmissions, Dr Andrew Marshall as scheduled, me in 1 year    Signed, Chanetta Marshall, NP 03/07/2016 3:41 PM  [address] [place] [address] [phone] (office) [phone] (fax)

## 2016-03-09 ENCOUNTER — Other Ambulatory Visit: Payer: Self-pay | Admitting: Cardiology

## 2016-03-11 ENCOUNTER — Encounter (INDEPENDENT_AMBULATORY_CARE_PROVIDER_SITE_OTHER): Payer: Self-pay

## 2016-03-11 ENCOUNTER — Ambulatory Visit (INDEPENDENT_AMBULATORY_CARE_PROVIDER_SITE_OTHER): Payer: Medicare Other | Admitting: *Deleted

## 2016-03-11 ENCOUNTER — Telehealth: Payer: Self-pay | Admitting: Internal Medicine

## 2016-03-11 DIAGNOSIS — I442 Atrioventricular block, complete: Secondary | ICD-10-CM

## 2016-03-11 LAB — CUP PACEART INCLINIC DEVICE CHECK
Battery Remaining Longevity: 109.2
Battery Voltage: 2.99 V
Brady Statistic RA Percent Paced: 96 %
Brady Statistic RV Percent Paced: 99.1 %
Date Time Interrogation Session: 20170717165644
Implantable Lead Implant Date: 20160329
Implantable Lead Implant Date: 20160329
Implantable Lead Location: 753859
Implantable Lead Location: 753860
Implantable Lead Model: 1948
Lead Channel Impedance Value: 425 Ohm
Lead Channel Impedance Value: 700 Ohm
Lead Channel Pacing Threshold Amplitude: 0.5 V
Lead Channel Pacing Threshold Amplitude: 0.625 V
Lead Channel Pacing Threshold Pulse Width: 0.4 ms
Lead Channel Pacing Threshold Pulse Width: 0.4 ms
Lead Channel Sensing Intrinsic Amplitude: 10.1 mV
Lead Channel Sensing Intrinsic Amplitude: 4.6 mV
Lead Channel Setting Pacing Amplitude: 0.875
Lead Channel Setting Pacing Amplitude: 2.5 V
Lead Channel Setting Pacing Pulse Width: 0.4 ms
Lead Channel Setting Sensing Sensitivity: 2.5 mV
Pulse Gen Model: 2240
Pulse Gen Serial Number: 7732903

## 2016-03-11 NOTE — Progress Notes (Addendum)
Patient seen in device clinic, added on for "chest tightness" during activity over the weekend, thought to be related to PPM rate response.  Note that rate response was turned on at 03/07/16 appointment with Chanetta Marshall, NP.  Patient reports that he had an episode of chest "hollowness" that developed into "tightness" over the weekend while he was mowing his lawn.  He had another episode while walking the next day.  Each time, the sensation dissipated with rest.  Patient states he would have gone to the ED, but he assumed it was related to the programming change.  Patient reports that he has exercised (3 mile bike ride, walking up and down his "long and hilly driveway" with trash cans) since these episodes without any symptoms.  Patient walked up and down stairs in office x2, felt he was appropriately "out of breath" for the amount of exercise, but denies any chest discomfort or symptoms similar to previous episodes.  HR 110 bpm (Ap/Vp) upon interrogation after exercise.  No PPM programming changes made.  Reviewed with Dr. Lovena Le (DOD), who recommended that patient follow-up with his primary cardiologist for further assessment of chest discomfort.  Patient verbalizes understanding and states that he now sees Dr. Wynonia Lawman.  He and his wife agree to call Dr. Thurman Coyer office tomorrow.

## 2016-03-11 NOTE — Telephone Encounter (Signed)
Returned patient's call.  Advised that we can adjust his rate response.  Patient is agreeable to a device clinic appointment today at 4:00pm.  He is appreciative of call and denies questions or concerns at this time.

## 2016-03-11 NOTE — Telephone Encounter (Signed)
New message     The pt saw Amber last week July 13 th and made adjustment to the pacemaker now when the pt states only when active he feel chest tightness    1. Has your device fired? no  2. Is you device beeping? no  3. Are you experiencing draining or swelling at device site? no  4. Are you calling to see if we received your device transmission? no  5. Have you passed out? no

## 2016-03-11 NOTE — Telephone Encounter (Signed)
valsartan (DIOVAN) 160 MG tablet  Medication   Date: 02/05/2016  Department: [address] Office  Ordering/Authorizing: Jerline Pain, MD      Order Providers    Prescribing Provider Encounter Provider   Jerline Pain, MD Jerline Pain, MD    Medication Detail      Disp Refills Start End     valsartan (DIOVAN) 160 MG tablet 60 tablet 5 02/05/2016     Sig - Route: Take 1 tablet (160 mg total) by mouth daily. Please call and schedule a six month follow up appointment per last office visit - Oral    E-Prescribing Status: Receipt confirmed by pharmacy (02/05/2016 3:42 PM EDT)     Pharmacy    [id number] - [place], [place]

## 2016-03-12 ENCOUNTER — Encounter: Payer: Self-pay | Admitting: Cardiology

## 2016-03-12 DIAGNOSIS — I209 Angina pectoris, unspecified: Secondary | ICD-10-CM | POA: Diagnosis not present

## 2016-03-12 DIAGNOSIS — I252 Old myocardial infarction: Secondary | ICD-10-CM | POA: Diagnosis not present

## 2016-03-12 DIAGNOSIS — Z955 Presence of coronary angioplasty implant and graft: Secondary | ICD-10-CM | POA: Diagnosis not present

## 2016-03-12 DIAGNOSIS — I119 Hypertensive heart disease without heart failure: Secondary | ICD-10-CM | POA: Diagnosis not present

## 2016-03-12 DIAGNOSIS — I2510 Atherosclerotic heart disease of native coronary artery without angina pectoris: Secondary | ICD-10-CM | POA: Diagnosis not present

## 2016-03-12 DIAGNOSIS — E785 Hyperlipidemia, unspecified: Secondary | ICD-10-CM | POA: Diagnosis not present

## 2016-03-12 DIAGNOSIS — Z950 Presence of cardiac pacemaker: Secondary | ICD-10-CM | POA: Diagnosis not present

## 2016-03-12 NOTE — Progress Notes (Signed)
Patient ID: Andrew Marshall, male   DOB: [birth year], 82 y.o.   MRN: [id number]  Andrew Marshall    Date of visit:  03/12/2016 DOB:  [birth year]    Age:  82 yrs. Medical record number:  [id number]     Account number:  [id number] Primary Care Provider: POLITE MD, Marshall ____________________________ CURRENT DIAGNOSES  1. CAD Native without angina  2. Hyperlipidemia  3. Hypertensive heart disease without heart failure  4. Old myocardial infarction  5. Presence of cardiac pacemaker  6. Presence of coronary stent  7. Angina pectoris, unspecified ____________________________ ALLERGIES  Ambien, Sleep walking  Erythromycin Base, Stomach ache ____________________________ MEDICATIONS  1. Viagra 100 mg tablet, prn  2. Nitrostat 0.4 mg sublingual tablet, PRN  3. Zyrtec 10 mg capsule, PRN  4. Aspirin Childrens 81 mg chewable tablet  5. atorvastatin 40 mg tablet, 1 p.o. daily  6. carvedilol 12.5 mg tablet, BID  7. Plavix 75 mg tablet, 1 p.o. daily  8. omega-3 fatty acids-fish oil 360 mg-1,200 mg capsule  9. resveratrol 250 mg capsule, 1 p.o. daily  10. CoQ-10 100 mg capsule, 1 p.o. daily  11. Vitamin D3 2,000 unit capsule, 1 p.o. daily  12. Pepcid AC 10 mg tablet, PRN  13. valsartan 160 mg tablet, BID ____________________________ HISTORY OF PRESENT ILLNESS Patient seen early for cardiac evaluation. He was seen in the device clinic and felt to have chronotropic incompetence although the patient was totally asymptomatic and had been able to bicycle. His rate response was turned on. He then developed an episode of a follow feeling in his chest when he was walking up a hill and had another episode when he was mowing the grass. He had never had symptoms like this before but since then has been able to ride his bike walk and take trashcans uphill without recurrent symptoms. was recalled to the pacemaker office and evidently walked up a flight of stairs and had a heart rate of 110 without symptoms. He was advised to  call the office here. He comes in today and states he feels well. Prior to the change in his pacer rate he was totally asymptomatic. He denies PND, orthopnea or edema. He had a stent placed to the circumflex in September of 17 by Dr. Tamala Marshall. He prior to the pacemaker changes says really not had any symptoms and was asymptomatic prior to this being done. ____________________________ PAST HISTORY  Past Medical Illnesses:  hypertension, hyperlipidemia, diverticulitis, prostate cancer treated with external beam RT and seed implants, history of lower GI bleed thought due to diverticulitis 2014, history of squamous cell cancer of the larynx, restless legs, lumbar disc disease;  Cardiovascular Illnesses:  CAD, lateral wall MI 2001, mitral regurgitation, history of arrhythmia-SVT;  Surgical Procedures:  appendectomy, ganglion cyst, foot surgery;  NYHA Classification:  I;  Canadian Angina Classification:  Class 0: Asymptomatic;  Cardiology Procedures-Invasive:  3.5/12mm NIR stet to prox circumflex 2001, 3.0/12mm NIR stent circumflex 2002 distal to prior stent, Brachytherapy for ISR in late 2002, 3.0 x 24 mm Cypher stent to second OM covering prior stents, Cath 9/16, 2.5x 12 mm Synergy stent to proximal circ pst dil to 3.25 mm 05/11/16 Dr. Tamala Marshall, St. Jude pacemaker implant 2016;  Cardiology Procedures-Noninvasive:  echocardiogram March 2016;  Cardiac Cath Results:  normal Left main, 30% stenosis proximal LAD, 90% stenosis proximal CFX, 30% stenosis proximal RCA, Synergy stent to proximal circumflex;  Peripheral Vascular Procedures:  carotid doppler April 2017;  LVEF of 45% documented via cardiac cath  on 05/05/2015,   ____________________________ CARDIO-PULMONARY TEST DATES EKG Date:  12/18/2015;   Cardiac Cath Date:  05/05/2015;  Echocardiography Date: 11/22/2014;  Chest Xray Date: 12/02/2014;   ____________________________ FAMILY HISTORY Brother -- Brother alive with problem, Diabetes mellitus Father -- Father dead,  Death due to natural cause Mother -- Mother dead, Carcinoma of breast ____________________________ SOCIAL HISTORY Alcohol Use:  no alcohol use;  Smoking:  25 pack year history, used to smoke but quit 2000;  Diet:  regular diet;  Lifestyle:  divorced and remarried;  Exercise:  exercises regularly;  Occupation:  retired Nurse, children's;  Residence:  lives with wife;   ____________________________ REVIEW OF SYSTEMS General:  denies recent weight change, fatique or change in exercise tolerance.  Integumentary:skin cancers Eyes: cataract extraction bilaterally, photophobia Respiratory: denies dyspnea, cough, wheezing or hemoptysis. Cardiovascular:  please review HPI Abdominal: diarrhea Genitourinary-Male: urgency, erectile dysfunction  Musculoskeletal:  arthritis of the thumbs, lumbar disc disease, has had cortisone injections Neurological:  restless legs Psychiatric:  insomnia  ____________________________ PHYSICAL EXAMINATION VITAL SIGNS  Blood Pressure:  124/70 Sitting, Right arm, regular cuff  , 130/74 Standing, Right arm and regular cuff   Pulse:  68/min. Weight:  178.00 lbs. Height:  69.5"BMI: 26  Constitutional:  pleasant white male in no acute distress Skin:  warm and dry to touch, no apparent skin lesions, or masses noted. Head:  normocephalic, normal hair pattern, no masses or tenderness Chest:  clear to auscultation, healed pacemaker incision in the left pectoral area Cardiac:  regular rhythm, normal S1 and S2, No S3 or S4, no murmurs, gallops or rubs detected. Peripheral Pulses:  the femoral,dorsalis pedis, and posterior tibial pulses are full and equal bilaterally with no bruits auscultated. Extremities & Back:  no spinal abnormalities noted., normal muscle strength and tone., 1+ edema, RLE venous insufficiency changes present Neurological:  no gross motor or sensory deficits noted, affect appropriate, oriented x3. ____________________________ MOST RECENT LIPID PANEL 08/10/15  CHOL TOTL  157 mg/dl, LDL 91 NM, HDL 47 mg/dl, TRIGLYCER 110 mg/dl, ALT 19 u/l, ALK PHOS 58 u/l, DLDL 91 and AST 20 u/l ____________________________ IMPRESSIONS/PLAN  1. Vague chest tightness occurring in the setting of a recent change from DDD to DDDR 2. Coronary artery disease with previous stent to the circumflex 3. Hypertension 4. Hyperlipidemia  Recommendations:  Long discussion with patient and wife. Pacemaker was interrogated today I originally was going to change him back to DDD but the patient was concerned about riding a bicycle. I really think the easiest thing to do would be to restore him to the previous settings when he was asymptomatic. Because of the concern about angina I would like him to have a myocardial perfusion scan which will need to be done with Lexiscan because of a paced rhythm. I asked him to get a followup visit with Dr. [name] to further discuss pacemaker programming. I would note that the patient is on carvedilol that can cause a low atrial rate. It might be best to put him back to DDD and to taper off the carvedilol if chronotropic incompetence is a concern.  Greater than 40 minutes spent with patient including greater than 50% of the time face-to-face, counseling, and interrogation of pacemaker, review of previous records and coordination of care.  ____________________________ TODAYS ORDERS  1. Lexiscan 1 day: First Available                       ____________________________ Cardiology Physician:  Kerry Hough.  MD [facility]

## 2016-03-13 ENCOUNTER — Other Ambulatory Visit: Payer: Self-pay | Admitting: Internal Medicine

## 2016-03-13 MED ORDER — VALSARTAN 160 MG PO TABS: 160.0000 mg | ORAL_TABLET | Freq: Every day | ORAL | Status: DC

## 2016-03-14 DIAGNOSIS — Z955 Presence of coronary angioplasty implant and graft: Secondary | ICD-10-CM | POA: Diagnosis not present

## 2016-03-14 DIAGNOSIS — Z950 Presence of cardiac pacemaker: Secondary | ICD-10-CM | POA: Diagnosis not present

## 2016-03-14 DIAGNOSIS — I119 Hypertensive heart disease without heart failure: Secondary | ICD-10-CM | POA: Diagnosis not present

## 2016-03-14 DIAGNOSIS — I2510 Atherosclerotic heart disease of native coronary artery without angina pectoris: Secondary | ICD-10-CM | POA: Diagnosis not present

## 2016-03-14 DIAGNOSIS — E785 Hyperlipidemia, unspecified: Secondary | ICD-10-CM | POA: Diagnosis not present

## 2016-03-14 DIAGNOSIS — I209 Angina pectoris, unspecified: Secondary | ICD-10-CM | POA: Diagnosis not present

## 2016-03-14 DIAGNOSIS — I252 Old myocardial infarction: Secondary | ICD-10-CM | POA: Diagnosis not present

## 2016-03-18 ENCOUNTER — Telehealth: Payer: Self-pay | Admitting: *Deleted

## 2016-03-18 NOTE — Telephone Encounter (Signed)
Spoke to patient in regards to sx's since rate response was turned on on 03/07/16. Patient states that he followed up with his primary cardiologist about his sx's and he felt that pt needed to f/u with Dr.Allred to see if further adjustments needed to be made in his rate response.   Staff message sent to Lorenda Hatchet for scheduling.

## 2016-03-20 ENCOUNTER — Encounter: Payer: Self-pay | Admitting: Internal Medicine

## 2016-03-20 ENCOUNTER — Ambulatory Visit (INDEPENDENT_AMBULATORY_CARE_PROVIDER_SITE_OTHER): Payer: Medicare Other | Admitting: Internal Medicine

## 2016-03-20 VITALS — BP 120/66 | HR 65 | Ht 69.5 in | Wt 180.2 lb

## 2016-03-20 DIAGNOSIS — I6529 Occlusion and stenosis of unspecified carotid artery: Secondary | ICD-10-CM

## 2016-03-20 DIAGNOSIS — I10 Essential (primary) hypertension: Secondary | ICD-10-CM | POA: Diagnosis not present

## 2016-03-20 DIAGNOSIS — I495 Sick sinus syndrome: Secondary | ICD-10-CM

## 2016-03-20 DIAGNOSIS — I442 Atrioventricular block, complete: Secondary | ICD-10-CM | POA: Diagnosis not present

## 2016-03-20 NOTE — Patient Instructions (Signed)
Medication Instructions:  Your physician recommends that you continue on your current medications as directed. Please refer to the Current Medication list given to you today.   Labwork: None ordered   Testing/Procedures: None ordered   Follow-Up: Your physician recommends that you schedule a follow-up appointment in: 6 weeks with Dr Allred   Any Other Special Instructions Will Be Listed Below (If Applicable).     If you need a refill on your cardiac medications before your next appointment, please call your pharmacy.   

## 2016-03-22 ENCOUNTER — Encounter: Payer: Self-pay | Admitting: Internal Medicine

## 2016-03-22 NOTE — Progress Notes (Signed)
PCP: Kandice Hams, MD Primary Cardiologist:  Dr Wynonia Lawman (previously Dr Marlou Porch)  Andrew Marshall is a 82 y.o. male who presents today for routine electrophysiology followup.  Recently complained of some fatigue and decreased exercise tolerance.  His rate response was turned on when he saw Chanetta Marshall NP.  He did have several days soon thereafter of subjective heart "pounding".  He felt that his heart rate was excessive and he had chest pain with activity.  He says that he had a low risk myoview by Dr Wynonia Lawman.  He has since had no further troubles.  He remains active.  Today, he denies symptoms of palpitations, SOB, chest pain,  lower extremity edema, dizziness, presyncope, or syncope.  The patient is otherwise without complaint today.   Past Medical History:  Diagnosis Date  . Allergic rhinitis   . Arthritis    "thumbs primarily" (05/05/2015)  . Basal cell carcinoma, face    "have had several; cut and burned off"  . CAD (coronary artery disease)    a. 2001 s/p post MI- multiple PCI's to LCX/OM;  b. 2003 DES to OM;  c. 05/2012 MV: postlat infarct w/ mild peri-infarct ischemia. nl EF->Med Rx.  . ED (erectile dysfunction)   . Esophageal reflux    "slight"  . Essential hypertension, benign   . History of atrial fibrillation   . History of GI diverticular bleed    a. 05/2013 and 02/2014 - taken off of ASA for this reason.  . Metatarsalgia   . MI (myocardial infarction) ([place]) 2001  . Moderate mitral insufficiency    a. 10/2014 Echo: EF 55-60%, Gr1 DD, mild AS, mod MR, sev dil LA, nl RV, mildly dil RA, mild TR, PASP 42mmHg.  . Pain, foot, chronic   . Pes planus   . Presence of permanent cardiac pacemaker   . Primary squamous cell carcinoma of larynx ([place]) 1970's  . Prostate cancer ([place]) 2008   S/P "radiation and seed implants"  . Pure hypercholesterolemia   . RLS (restless legs syndrome)    Past Surgical History:  Procedure Laterality Date  . APPENDECTOMY  1956  . BASAL CELL CARCINOMA  EXCISION     "face"  . CARDIAC CATHETERIZATION N/A 05/05/2015   Procedure: Left Heart Cath and Coronary Angiography;  Surgeon: Belva Crome, MD;  Location: [place] [facility];  Service: Cardiovascular;  Laterality: N/A;  . CARDIAC CATHETERIZATION N/A 05/05/2015   Procedure: Coronary Stent Intervention;  Surgeon: Belva Crome, MD;  Location: [place] [facility];  Service: Cardiovascular;  Laterality: N/A;  . CATARACT EXTRACTION W/ INTRAOCULAR LENS IMPLANT Left 12/2014  . COLONOSCOPY W/ POLYPECTOMY    . CORONARY ANGIOPLASTY    . CORONARY ANGIOPLASTY WITH STENT PLACEMENT  04/2000; ? ~ 2002; 01/2002  . ESTERNAL BEAM XRT AND SEEDS FOR PROSTATE CANCER    . FOOT ARTHRODESIS, TRIPLE Right 2007  . GANGLION CYST EXCISION Right 1970's  . INSERT / REPLACE / REMOVE PACEMAKER    . PERMANENT PACEMAKER INSERTION N/A 11/22/2014   Procedure: PERMANENT PACEMAKER INSERTION;  Surgeon: [name], MD;  Location: [facility];  Service: Cardiovascular;  Laterality: N/A;  . PROSTATE BIOPSY    . TONSILLECTOMY  1942    ROS- all systems are reviewed and negative except as per HPI above  Current Outpatient Prescriptions  Medication Sig Dispense Refill  . aspirin EC 81 MG tablet Take 1 tablet (81 mg total) by mouth daily.    Marland Kitchen atorvastatin (LIPITOR) 40 MG tablet  TAKE 1 TABLET (40 MG TOTAL) BY MOUTH DAILY. 90 tablet 1  . carvedilol (COREG) 12.5 MG tablet TAKE 1 TABLET (12.5 MG TOTAL) BY MOUTH 2 (TWO) TIMES DAILY. 60 tablet 5  . cetirizine (ZYRTEC) 10 MG tablet Take 10 mg by mouth daily.    . Cholecalciferol (VITAMIN D3) 2000 units TABS Take 1 tablet by mouth daily.    . clopidogrel (PLAVIX) 75 MG tablet Take 75 mg by mouth daily.    . Coenzyme Q-10 100 MG capsule Take 100 mg by mouth daily.     . famotidine (PEPCID) 10 MG tablet Take 10 mg by mouth daily as needed for heartburn or indigestion.    . Multiple Vitamin (MULTIVITAMIN) tablet Take 1 tablet by mouth 3 (three) times a week.     . nitroGLYCERIN (NITROSTAT) 0.4  MG SL tablet Place 1 tablet (0.4 mg total) under the tongue every 5 (five) minutes as needed for chest pain (MAX 3 TABLETS). 25 tablet 3  . Omega-3 Fatty Acids (FISH OIL PO) Take 2,400 mg by mouth daily.    Marland Kitchen Resveratrol 250 MG CAPS Take 1 capsule by mouth daily.    . sildenafil (VIAGRA) 100 MG tablet Take 100 mg by mouth daily as needed for erectile dysfunction.     . valsartan (DIOVAN) 160 MG tablet Take 160 mg by mouth 2 (two) times daily.     No current facility-administered medications for this visit.     Physical Exam: Vitals:   03/20/16 0915  BP: 120/66  Pulse: 65  Weight: 180 lb 3.2 oz (81.7 kg)  Height: 5' 9.5" (1.765 m)    GEN- The patient is well appearing, alert and oriented x 3 today.   Head- normocephalic, atraumatic Eyes-  Sclera clear, conjunctiva pink Ears- hearing intact Oropharynx- clear Lungs- Clear to ausculation bilaterally, normal work of breathing Chest- pacemaker pocket has small ecchymosis, no hematoma Heart- Regular rate and rhythm, no murmurs, rubs or gallops, PMI not laterally displaced GI- soft, NT, ND, + BS Extremities- no clubbing, cyanosis, or edema  Pacemaker interrogation- reviewed in detail today,  See PACEART report  Assessment and Plan:  1. Complete heart block/ sick sinus syndrome Normal pacemaker function See Claudia Desanctis Art report I had a long discussion with the patient today about options of turning rate response off, adjusting his sensor, or leaving it as is (as he now feels better).   He is clear that he would like to keep rate response turned on but with adjustment.  I have therefore lowered his slope from 10 to 8.  I have also reduced upper rate from 130 to 125 bpm (pt preference). We ambulated him in the office today and he did well. He will return for reassessment by me in 4-6 weeks. He will all our office should problems arise.  2. Nonsustained atach/ afib Asymptomatic Episodes < 60 seconds Consider anticoagulation if episodes  increase  3. HTN Stable No change required today  Merlin remote monitoring Follow-up with Dr Wynonia Lawman as scheduled Return to see me in 4-6 weeks  [name] MD, [facility] 03/22/2016 8:51 AM

## 2016-03-28 ENCOUNTER — Encounter: Payer: Self-pay | Admitting: Internal Medicine

## 2016-04-09 DIAGNOSIS — H906 Mixed conductive and sensorineural hearing loss, bilateral: Secondary | ICD-10-CM | POA: Diagnosis not present

## 2016-04-25 DIAGNOSIS — G4700 Insomnia, unspecified: Secondary | ICD-10-CM | POA: Diagnosis not present

## 2016-05-06 ENCOUNTER — Ambulatory Visit (INDEPENDENT_AMBULATORY_CARE_PROVIDER_SITE_OTHER): Payer: Medicare Other | Admitting: Internal Medicine

## 2016-05-06 ENCOUNTER — Encounter: Payer: Self-pay | Admitting: Internal Medicine

## 2016-05-06 VITALS — BP 148/80 | HR 60 | Ht 69.5 in | Wt 178.8 lb

## 2016-05-06 DIAGNOSIS — I6529 Occlusion and stenosis of unspecified carotid artery: Secondary | ICD-10-CM

## 2016-05-06 DIAGNOSIS — I495 Sick sinus syndrome: Secondary | ICD-10-CM | POA: Diagnosis not present

## 2016-05-06 MED ORDER — CARVEDILOL 12.5 MG PO TABS: 6.2500 mg | ORAL_TABLET | Freq: Two times a day (BID) | ORAL | 5 refills | Status: DC

## 2016-05-06 NOTE — Progress Notes (Signed)
PCP: Kandice Hams, MD Primary Cardiologist:  Dr Wynonia Lawman (previously Dr Marlou Porch)  Andrew Marshall is a 82 y.o. male who presents today for routine electrophysiology followup.  He remains active.  He feels that he is SOB with moderate activity.  This did not improve with rate response change last visit.  Today, he denies symptoms of palpitations, SOB, chest pain,  lower extremity edema, dizziness, presyncope, or syncope.  The patient is otherwise without complaint today.   Past Medical History:  Diagnosis Date  . Allergic rhinitis   . Arthritis    "thumbs primarily" (05/05/2015)  . Basal cell carcinoma, face    "have had several; cut and burned off"  . CAD (coronary artery disease)    a. 2001 s/p post MI- multiple PCI's to LCX/OM;  b. 2003 DES to OM;  c. 05/2012 MV: postlat infarct w/ mild peri-infarct ischemia. nl EF->Med Rx.  . ED (erectile dysfunction)   . Esophageal reflux    "slight"  . Essential hypertension, benign   . History of atrial fibrillation   . History of GI diverticular bleed    a. 05/2013 and 02/2014 - taken off of ASA for this reason.  . Metatarsalgia   . MI (myocardial infarction) ([place]) 2001  . Moderate mitral insufficiency    a. 10/2014 Echo: EF 55-60%, Gr1 DD, mild AS, mod MR, sev dil LA, nl RV, mildly dil RA, mild TR, PASP 21mmHg.  . Pain, foot, chronic   . Pes planus   . Presence of permanent cardiac pacemaker   . Primary squamous cell carcinoma of larynx ([place]) 1970's  . Prostate cancer ([place]) 2008   S/P "radiation and seed implants"  . Pure hypercholesterolemia   . RLS (restless legs syndrome)    Past Surgical History:  Procedure Laterality Date  . APPENDECTOMY  1956  . BASAL CELL CARCINOMA EXCISION     "face"  . CARDIAC CATHETERIZATION N/A 05/05/2015   Procedure: Left Heart Cath and Coronary Angiography;  Surgeon: Belva Crome, MD;  Location: [place] [facility];  Service: Cardiovascular;  Laterality: N/A;  . CARDIAC CATHETERIZATION N/A 05/05/2015   Procedure:  Coronary Stent Intervention;  Surgeon: Belva Crome, MD;  Location: [place] [facility];  Service: Cardiovascular;  Laterality: N/A;  . CATARACT EXTRACTION W/ INTRAOCULAR LENS IMPLANT Left 12/2014  . COLONOSCOPY W/ POLYPECTOMY    . CORONARY ANGIOPLASTY    . CORONARY ANGIOPLASTY WITH STENT PLACEMENT  04/2000; ? ~ 2002; 01/2002  . ESTERNAL BEAM XRT AND SEEDS FOR PROSTATE CANCER    . FOOT ARTHRODESIS, TRIPLE Right 2007  . GANGLION CYST EXCISION Right 1970's  . INSERT / REPLACE / REMOVE PACEMAKER    . PERMANENT PACEMAKER INSERTION N/A 11/22/2014   Procedure: PERMANENT PACEMAKER INSERTION;  Surgeon: [name], MD;  Location: [facility] CATH LAB;  Service: Cardiovascular;  Laterality: N/A;  . PROSTATE BIOPSY    . TONSILLECTOMY  1942    ROS- all systems are reviewed and negative except as per HPI above  Current Outpatient Prescriptions  Medication Sig Dispense Refill  . aspirin EC 81 MG tablet Take 1 tablet (81 mg total) by mouth daily.    Marland Kitchen atorvastatin (LIPITOR) 40 MG tablet TAKE 1 TABLET (40 MG TOTAL) BY MOUTH DAILY. 90 tablet 1  . carvedilol (COREG) 12.5 MG tablet Take 0.5 tablets (6.25 mg total) by mouth 2 (two) times daily with a meal. 60 tablet 5  . cetirizine (ZYRTEC) 10 MG tablet Take 10 mg by mouth daily.    Marland Kitchen  Cholecalciferol (VITAMIN D3) 2000 units TABS Take 1 tablet by mouth daily.    . clopidogrel (PLAVIX) 75 MG tablet Take 75 mg by mouth daily.    . Coenzyme Q-10 100 MG capsule Take 100 mg by mouth daily.     . famotidine (PEPCID) 10 MG tablet Take 10 mg by mouth daily as needed for heartburn or indigestion.    . Multiple Vitamin (MULTIVITAMIN) tablet Take 1 tablet by mouth 3 (three) times a week.     . nitroGLYCERIN (NITROSTAT) 0.4 MG SL tablet Place 1 tablet (0.4 mg total) under the tongue every 5 (five) minutes as needed for chest pain (MAX 3 TABLETS). 25 tablet 3  . Omega-3 Fatty Acids (FISH OIL PO) Take 2,400 mg by mouth daily.    Marland Kitchen Resveratrol 250 MG CAPS Take 1 capsule by mouth  daily.    . sildenafil (VIAGRA) 100 MG tablet Take 100 mg by mouth daily as needed for erectile dysfunction.     . valsartan (DIOVAN) 160 MG tablet Take 160 mg by mouth 2 (two) times daily.     No current facility-administered medications for this visit.     Physical Exam: Vitals:   05/06/16 1354  BP: (!) 148/80  Pulse: 60  Weight: 178 lb 12.8 oz (81.1 kg)  Height: 5' 9.5" (1.765 m)    GEN- The patient is well appearing, alert and oriented x 3 today.   Head- normocephalic, atraumatic Eyes-  Sclera clear, conjunctiva pink Ears- hearing intact Oropharynx- clear Lungs- Clear to ausculation bilaterally, normal work of breathing Chest- pacemaker pocket has small ecchymosis, no hematoma Heart- Regular rate and rhythm, no murmurs, rubs or gallops, PMI not laterally displaced GI- soft, NT, ND, + BS Extremities- no clubbing, cyanosis, or edema  Pacemaker interrogation- reviewed in detail today,  See PACEART report  Assessment and Plan:  1. Complete heart block/ sick sinus syndrome Normal pacemaker function See Claudia Desanctis Art report He is 95% A paced and 100% V paced.  I did not make any changes to his device today Reduce coreg to 6.25mg  BID today to see if exercise tolerance improves Follow-up with Dr Wynonia Lawman for further evaluation and management  2. HTN Will need to follow closely with reduced coreg No changes today  Merlin remote monitoring Follow-up with Dr Wynonia Lawman as scheduled Return to see me when needed  [name] MD, [facility] 05/06/2016 2:14 PM

## 2016-05-06 NOTE — Patient Instructions (Signed)
Medication Instructions:  Your physician has recommended you make the following change in your medication:  1) Decrease Carvedilol to 6.25 mg twice daily   Labwork: None ordered   Testing/Procedures: None ordered   Follow-Up: Your physician recommends that you schedule a follow-up appointment as needed with Dr [name] will follow with Dr Wynonia Lawman   Any Other Special Instructions Will Be Listed Below (If Applicable).     If you need a refill on your cardiac medications before your next appointment, please call your pharmacy.

## 2016-05-20 DIAGNOSIS — Z8546 Personal history of malignant neoplasm of prostate: Secondary | ICD-10-CM | POA: Diagnosis not present

## 2016-05-23 DIAGNOSIS — S93501A Unspecified sprain of right great toe, initial encounter: Secondary | ICD-10-CM | POA: Diagnosis not present

## 2016-05-25 DIAGNOSIS — M13171 Monoarthritis, not elsewhere classified, right ankle and foot: Secondary | ICD-10-CM | POA: Diagnosis not present

## 2016-06-02 DIAGNOSIS — M1310 Monoarthritis, not elsewhere classified, unspecified site: Secondary | ICD-10-CM | POA: Diagnosis not present

## 2016-06-04 DIAGNOSIS — Z23 Encounter for immunization: Secondary | ICD-10-CM | POA: Diagnosis not present

## 2016-06-04 DIAGNOSIS — M1310 Monoarthritis, not elsewhere classified, unspecified site: Secondary | ICD-10-CM | POA: Diagnosis not present

## 2016-06-18 ENCOUNTER — Ambulatory Visit (INDEPENDENT_AMBULATORY_CARE_PROVIDER_SITE_OTHER): Payer: Medicare Other | Admitting: Pulmonary Disease

## 2016-06-18 ENCOUNTER — Encounter: Payer: Self-pay | Admitting: Pulmonary Disease

## 2016-06-18 DIAGNOSIS — G4700 Insomnia, unspecified: Secondary | ICD-10-CM

## 2016-06-18 NOTE — Assessment & Plan Note (Addendum)
Pt is being seen for insomnia.  No life changing event when this started 5 yrs ago.  Slowly changed pt's life/schedule.  He has good sleep habits/hygiene.  Thoughts:  1.He has some signs of OSA but less likely to be the main reason for insomnia.  2. I think the main reason is 2/2 mental health issues. As mentioned, he had issues growing up in [place].  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  This is probably in his subconscious.  At the end of the day, I think he is afraid to sleep for fear that he will not wake up.  Come 3-4 am, the body is just so tired, he eventually falls asleep.  This has been frequently happening that it has become his norm.  I asked him why he wanted to change his habits.  He said he wanted to wake up earlier so he can "maximize the day" and spend more time with his wife.  Wife sleeps early and wakes up at 7-8am.  3. Pt has always been a "night person".  He has always been productive at night so this is just how he has been wired.   Plan : 1. Pt wants to wake up earlier at 8am to spend more time with the wife.  At least, I mentioned to him that part of the issue is because of anxiety and stress.  He is able to acknowledge this and work on his anxiety non pharmacologically. I mentioned, he can be placed on meds and he has to mention this to his PCP.  2. We discussed about various behaviors to achieve this goal. One way is to slowly advance his wake up time by 30 minutes q weekly.  With this, since he wants 7 hrs of sleep, he should go to bed 7 hrs before his set wake up time. He usually wakes up at 9:30-10 am.  He can set his wake up time at 9am strictly and go to bed at 2am strictly for 1 week.  Once attained, he advances his wake up time to 8:30 am, etc.  He advances his wake up time weekly. He will try this and see if it will work.  3.  I mentioned to him drastic changes  in behavior such as setting his wake up time at 8am strictly and he goes to bed strictly at 1 am. No naps. This will eventually force him to fall asleep at 1am.  He feels this might be too hard but he may try this as well.  4.  Mentioned to him to take melatonin 1 hr before bedtime and make sure the room is dark to enhance the effects of this hormone.  5. He will ask the wife to observe if he has witnessed apneas, gasping, choking, and other signs of OSA.  If he does, he will call back to have a HST done.  Anticipate, he will not have issues with cpap.  6. Mentioned about Cognitive Behavior Therapy as another option. He will think about things and decide.   Patient seemed receptive to changing his habits. He wants to try to change things w/o the aid of medicines.  He will get back at me if the insomnia persists to be a problem.  He will ask his wife re: symptoms of OSA.  If wife is concerned about OSA, he will have a sleep study.  I am not too concerned about his tossing and turning causing insomnia as he feels it does not prevent him from falling asleep.

## 2016-06-18 NOTE — Progress Notes (Signed)
Subjective:    Patient ID: Andrew Marshall, male    DOB: 12/16/1930, 82 y.o.   MRN: [id number]  HPI  This is the case of Andrew Marshall, 82 y.o. Male, who made a self referral for insomnia.   As you very well know, patient has a 25 pack year smoking history, quit in 2000 when he had a heart attack.   Patient's main issue is insomnia.  This started 5 yrs ago.  No life changing event then.  It slowly crept up on patient. At the end of the day, through the yrs, pt ends up falling asleep anytime from 3am-4am.  He sleeps for 5-6 hrs, usually straight, then wakes up at 9-10 am.  He goes to bed at 12:45 am- 1am.  Lays down for 20-30 minutes and usually is not able to fall asleep. He gets up and starts reading boring books or engage in boring stuff.  After 20-30 minutes, he goes back to bed and he attempts to sleep to no avail.  This cycle happens for 3-4 times until he eventually falls asleep at 3am-4am.   He is married. Per wife, he snores. Denies witnessed apneas,gasping, choking.  Pt tosses and turns a lot when he is asleep. Pt denies issues with legs preventing him from falling asleep.   He is very educated and has read a lot about sleep hygiene and does follow good sleep habits.  His room is dark and conducive to sleeping.  He has tried different meds to no avail. Currently, he takes melatonin.  Ambien made him sleep walk.  He has tried hypnosis and other therapies  w/o much effect.   When he wakes up in the morning, sometimes he does not have "the energy" to start the day.  Denies HA.  Has some hypersomnia during the day but not too bad.  He rarely naps during daytime.  He tries to be physically active.  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  He had his own business in [place] which eventually merged with a business here in [place].   He denies being depressed or being anxious. He did mention that he "had a horrible childhood" when  he was growing up in [place].  Denied insomnia then.  At this juncture, he is preoccupied about the fact that his colleagues are dying one by one and most of them are in SNF or deconditioned.  Talking to him, I get the sense that he equates "falling asleep" to "potentially not waking up (i.e. Dying)" and he acknowledges that this thought is in his subconscious.   He had a sleep study in the lab 5 years ago.  That was (-) as he did not fall asleep.    Review of Systems  Constitutional: Negative for fever and unexpected weight change.  HENT: Negative for congestion, dental problem, ear pain, nosebleeds, postnasal drip, rhinorrhea, sinus pressure, sneezing, sore throat and trouble swallowing.   Eyes: Negative for redness and itching.  Respiratory: Negative for cough, chest tightness, shortness of breath and wheezing.   Cardiovascular: Negative for palpitations and leg swelling.  Gastrointestinal: Negative for nausea and vomiting.  Genitourinary: Negative for dysuria.  Musculoskeletal: Negative for joint swelling.  Skin: Negative for rash.  Neurological: Negative for headaches.  Hematological: Does not bruise/bleed easily.  Psychiatric/Behavioral: Negative for dysphoric mood. The patient is not nervous/anxious.    Past Medical History:  Diagnosis Date  .  Allergic rhinitis   . Arthritis    "thumbs primarily" (05/05/2015)  . Basal cell carcinoma, face    "have had several; cut and burned off"  . CAD (coronary artery disease)    a. 2001 s/p post MI- multiple PCI's to LCX/OM;  b. 2003 DES to OM;  c. 05/2012 MV: postlat infarct w/ mild peri-infarct ischemia. nl EF->Med Rx.  . ED (erectile dysfunction)   . Esophageal reflux    "slight"  . Essential hypertension, benign   . History of atrial fibrillation   . History of GI diverticular bleed    a. 05/2013 and 02/2014 - taken off of ASA for this reason.  . Metatarsalgia   . MI (myocardial infarction) 2001  . Moderate mitral insufficiency    a.  10/2014 Echo: EF 55-60%, Gr1 DD, mild AS, mod MR, sev dil LA, nl RV, mildly dil RA, mild TR, PASP 96mmHg.  . Pain, foot, chronic   . Pes planus   . Presence of permanent cardiac pacemaker   . 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 Prostate cancer ([place]) 2008   S/P "radiation and seed implants"  . Pure hypercholesterolemia   . RLS (restless legs syndrome)     S/P Radiation for prostate CA.  (-) DVT Family History  Problem Relation Age of Onset  . Breast cancer Mother   . Heart disease Father      Past Surgical History:  Procedure Laterality Date  . APPENDECTOMY  1956  . BASAL CELL CARCINOMA EXCISION     "face"  . CARDIAC CATHETERIZATION N/A 05/05/2015   Procedure: Left Heart Cath and Coronary Angiography;  Surgeon: Belva Crome, MD;  Location: [place] [facility];  Service: Cardiovascular;  Laterality: N/A;  . CARDIAC CATHETERIZATION N/A 05/05/2015   Procedure: Coronary Stent Intervention;  Surgeon: Belva Crome, MD;  Location: [place] [facility];  Service: Cardiovascular;  Laterality: N/A;  . CATARACT EXTRACTION W/ INTRAOCULAR LENS IMPLANT Left 12/2014  . COLONOSCOPY W/ POLYPECTOMY    . CORONARY ANGIOPLASTY    . CORONARY ANGIOPLASTY WITH STENT PLACEMENT  04/2000; ? ~ 2002; 01/2002  . ESTERNAL BEAM XRT AND SEEDS FOR PROSTATE CANCER    . FOOT ARTHRODESIS, TRIPLE Right 2007  . GANGLION CYST EXCISION Right 1970's  . INSERT / REPLACE / REMOVE PACEMAKER    . PERMANENT PACEMAKER INSERTION N/A 11/22/2014   Procedure: PERMANENT PACEMAKER INSERTION;  Surgeon: [name], MD;  Location: [facility] CATH LAB;  Service: Cardiovascular;  Laterality: N/A;  . PROSTATE BIOPSY    . TONSILLECTOMY  1942    Social History   Social History  . Marital status: Married    Spouse name: N/A  . Number of children: N/A  . Years of education: N/A   Occupational History  . Not on file.   Social History Main Topics  . Smoking status: Former Smoker    Packs/day: 0.50    Years: 50.00     Types: Cigarettes    Quit date: 05/22/2000  . Smokeless tobacco: Never Used  . Alcohol use 1.8 oz/week    3 Glasses of wine per week     Comment: 05/05/2015 "3-6 glasses of wine/day"  . Drug use: No  . Sexual activity: Yes   Other Topics Concern  . Not on file   Social History Narrative  . 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 Drinks wine nightly.   Allergies  Allergen  Reactions  . Ambien [Zolpidem] Other (See Comments)    Sleep walk   . Erythromycin Nausea And Vomiting and Other (See Comments)    GI upset and pains     Outpatient Medications Prior to Visit  Medication Sig Dispense Refill  . aspirin EC 81 MG tablet Take 1 tablet (81 mg total) by mouth daily.    Marland Kitchen atorvastatin (LIPITOR) 40 MG tablet TAKE 1 TABLET (40 MG TOTAL) BY MOUTH DAILY. 90 tablet 1  . carvedilol (COREG) 12.5 MG tablet Take 0.5 tablets (6.25 mg total) by mouth 2 (two) times daily with a meal. 60 tablet 5  . cetirizine (ZYRTEC) 10 MG tablet Take 10 mg by mouth daily.    . Cholecalciferol (VITAMIN D3) 2000 units TABS Take 1 tablet by mouth daily.    . clopidogrel (PLAVIX) 75 MG tablet Take 75 mg by mouth daily.    . Coenzyme Q-10 100 MG capsule Take 100 mg by mouth daily.     . famotidine (PEPCID) 10 MG tablet Take 10 mg by mouth daily as needed for heartburn or indigestion.    . Multiple Vitamin (MULTIVITAMIN) tablet Take 1 tablet by mouth 3 (three) times a week.     . nitroGLYCERIN (NITROSTAT) 0.4 MG SL tablet Place 1 tablet (0.4 mg total) under the tongue every 5 (five) minutes as needed for chest pain (MAX 3 TABLETS). 25 tablet 3  . Omega-3 Fatty Acids (FISH OIL PO) Take 2,400 mg by mouth daily.    Marland Kitchen Resveratrol 250 MG CAPS Take 1 capsule by mouth daily.    . sildenafil (VIAGRA) 100 MG tablet Take 100 mg by mouth daily as needed for erectile dysfunction.     . valsartan (DIOVAN) 160 MG tablet Take 160 mg by mouth 2 (two) times daily.     No facility-administered medications  prior to visit.    No orders of the defined types were placed in this encounter.       Objective:   Physical Exam  Vitals:  Vitals:   06/18/16 1418  BP: (!) 144/72  Pulse: 62  SpO2: 98%  Weight: 174 lb (78.9 kg)  Height: 5' 9.5" (1.765 m)    Constitutional/General:  Pleasant, well-nourished, well-developed, not in any distress,  Comfortably seating.  Well kempt  Body mass index is 25.33 kg/m. Wt Readings from Last 3 Encounters:  06/18/16 174 lb (78.9 kg)  05/06/16 178 lb 12.8 oz (81.1 kg)  03/20/16 180 lb 3.2 oz (81.7 kg)    HEENT: Pupils equal and reactive to light and accommodation. Anicteric sclerae. Normal nasal mucosa.   No oral  lesions,  mouth clear,  oropharynx clear, no postnasal drip. (-) Oral thrush. No dental caries.  Airway - Mallampati class III  Neck: No masses. Midline trachea. No JVD, (-) LAD. (-) bruits appreciated.  Respiratory/Chest: Grossly normal chest. (-) deformity. (-) Accessory muscle use.  Symmetric expansion. (-) Tenderness on palpation.  Resonant on percussion.  Diminished BS on both lower lung zones. (-) wheezing, crackles, rhonchi (-) egophony  Cardiovascular: Regular rate and  rhythm, heart sounds normal, no murmur or gallops, no peripheral edema  Gastrointestinal:  Normal bowel sounds. Soft, non-tender. No hepatosplenomegaly.  (-) masses.   Musculoskeletal:  Normal muscle tone. Normal gait.   Extremities: Grossly normal. (-) clubbing, cyanosis.  (-) edema  Skin: (-) rash,lesions seen.   Neurological/Psychiatric : alert, oriented to time, place, person. Normal mood and affect          Assessment &  Plan:  Insomnia Pt is being seen for insomnia.  No life changing event when this started 5 yrs ago.  Slowly changed pt's life/schedule.  He has good sleep habits/hygiene.  Thoughts:  1.He has some signs of OSA but less likely to be the main reason for insomnia.  2. I think the main reason is 2/2 mental health issues. As  mentioned, he had issues growing up in [place].  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  This is probably in his subconscious.  At the end of the day, I think he is afraid to sleep for fear that he will not wake up.  Come 3-4 am, the body is just so tired, he eventually falls asleep.  This has been frequently happening that it has become his norm.  I asked him why he wanted to change his habits.  He said he wanted to wake up earlier so he can "maximize the day" and spend more time with his wife.  Wife sleeps early and wakes up at 7-8am.  3. Pt has always been a "night person".  He has always been productive at night so this is just how he has been wired.   Plan : 1. Pt wants to wake up earlier at 8am to spend more time with the wife.  At least, I mentioned to him that part of the issue is because of anxiety and stress.  He is able to acknowledge this and work on his anxiety non pharmacologically. I mentioned, he can be placed on meds and he has to mention this to his PCP.  2. We discussed about various behaviors to achieve this goal. One way is to slowly advance his wake up time by 30 minutes q weekly.  With this, since he wants 7 hrs of sleep, he should go to bed 7 hrs before his set wake up time. He usually wakes up at 9:30-10 am.  He can set his wake up time at 9am strictly and go to bed at 2am strictly for 1 week.  Once attained, he advances his wake up time to 8:30 am, etc.  He advances his wake up time weekly. He will try this and see if it will work.  3.  I mentioned to him drastic changes in behavior such as setting his wake up time at 8am strictly and he goes to bed strictly at 1 am. No naps. This will eventually force him to fall asleep at 1am.  He feels this might be too hard but he may try this as well.  4.  Mentioned to him to take melatonin 1 hr before bedtime and make sure the room is  dark to enhance the effects of this hormone.  5. He will ask the wife to observe if he has witnessed apneas, gasping, choking, and other signs of OSA.  If he does, he will call back to have a HST done.  Anticipate, he will not have issues with cpap.  6. Mentioned about Cognitive Behavior Therapy as another option. He will think about things and decide.   Patient seemed receptive to changing his habits. He wants to try to change things w/o the aid of medicines.  He will get back at me if the insomnia persists to be a problem.  He will ask his wife re: symptoms of OSA.  If wife is concerned about OSA, he will have  a sleep study.  I am not too concerned about his tossing and turning causing insomnia as he feels it does not prevent him from falling asleep.      Return to clinic as needed.   Monica Becton, MD 06/18/2016, 11:46 PM [facility] Pager [phone] After 3 pm or if no answer, call [phone]

## 2016-06-18 NOTE — Patient Instructions (Signed)
It was a pleasure taking care of you today!  5 Rules of Good Sleep Hygiene: 1. Go to bed only when sleepy. 2. Try to wake up at the same time everyday.  3. If you are not able to fall asleep within the hour, get up and do something boring and monotonous.  Continue doing this until you get sleepy and you fall asleep.  4. Avoid naps. 5. The bed should just be used for sleep and intimacy.    Return to clinic as needed.

## 2016-06-26 DIAGNOSIS — H2511 Age-related nuclear cataract, right eye: Secondary | ICD-10-CM | POA: Diagnosis not present

## 2016-07-03 ENCOUNTER — Other Ambulatory Visit: Payer: Self-pay | Admitting: Cardiology

## 2016-07-03 NOTE — Telephone Encounter (Signed)
Carvedilol decreased to 12.5 mg tab - take 0.5 tab BID on 05/06/16 by Dr. [name]  Assessment and Plan:  1. Complete heart block/ sick sinus syndrome Normal pacemaker function See Claudia Desanctis Art report He is 95% A paced and 100% V paced.  I did not make any changes to his device today Reduce coreg to 6.25mg  BID today to see if exercise tolerance improves Follow-up with Dr Wynonia Lawman for further evaluation and management  2. HTN Will need to follow closely with reduced coreg No changes today  Merlin remote monitoring Follow-up with Dr Wynonia Lawman as scheduled Return to see me when needed  [name] MD, [facility] 05/06/2016 2:14 PM      Referring Provider   Seward Carol, MD      Diagnoses    Codes Comments  Sick sinus syndrome ([place]) - Primary ICD-9-CM: 427.81 ICD-10-CM: I49.5        Reason for Visit   Follow-up SSS  Reason for Visit History    Ordered Medications    Disp Refills Start End  carvedilol (COREG) 12.5 MG tablet 60 tablet 5 05/06/2016   Take 0.5 tablets (6.25 mg total) by mouth 2 (two) times daily with a meal. - Oral  Discontinued Medications    Reason for Discontinue  carvedilol (COREG) 12.5 MG tablet Reorder  Level of Service   Level of Service  PR OFFICE OUTPATIENT VISIT 15 MINUTES [id number]   Follow-up and Disposition   Return if symptoms worsen or fail to improve.  Routing History  Follow-up and Disposition History    All Charges for This Encounter   Code Description Service Date Service Provider Modifiers Qty  [id number] PR OFFICE OUTPATIENT VISIT 15 MINUTES 05/06/2016 [name], MD  1  [phone] PR ASA/ANTIPLAT THER USED 05/06/2016 [name], MD  1  [phone] PR CURRENT TOBACCO NON-USER 05/06/2016 [name], MD  1  [phone] PR PROGRAM EVAL IMPLANTABLE IN PERSN DUAL LD PACER 05/06/2016 [name], MD  1  Routing History   From: [name], MD On: 05/06/2016 2:20 PM  To: Jacolyn Reedy, MD  Priority: Routine  AVS Reports   Date/Time Report  Action User  05/06/2016 2:12 PM After Visit Summary Printed Dionicio Stall, RN  Patient Instructions   Medication Instructions:  Your physician has recommended you make the following change in your medication:  1) Decrease Carvedilol to 6.25 mg twice daily   Labwork: None ordered   Testing/Procedures: None ordered   Follow-Up: Your physician recommends that you schedule a follow-up appointment as needed with Dr [name] will follow with Dr Wynonia Lawman   Any Other Special Instructions Will Be Listed Below (If Applicable).     If you need a refill on your cardiac medications before your next appointment, please call your pharmacy.

## 2016-07-05 DIAGNOSIS — M5116 Intervertebral disc disorders with radiculopathy, lumbar region: Secondary | ICD-10-CM | POA: Diagnosis not present

## 2016-07-11 ENCOUNTER — Other Ambulatory Visit: Payer: Self-pay | Admitting: Surgical

## 2016-07-11 DIAGNOSIS — M5116 Intervertebral disc disorders with radiculopathy, lumbar region: Secondary | ICD-10-CM

## 2016-07-26 HISTORY — PX: EYE SURGERY: SHX253

## 2016-07-30 ENCOUNTER — Other Ambulatory Visit: Payer: Self-pay | Admitting: Physician Assistant

## 2016-08-01 ENCOUNTER — Ambulatory Visit
Admission: RE | Admit: 2016-08-01 | Discharge: 2016-08-01 | Disposition: A | Discharge status: Home / Self Care | Payer: Medicare Other | Source: Ambulatory Visit | Attending: Surgical | Admitting: Surgical

## 2016-08-01 DIAGNOSIS — M5116 Intervertebral disc disorders with radiculopathy, lumbar region: Secondary | ICD-10-CM

## 2016-08-01 DIAGNOSIS — M48061 Spinal stenosis, lumbar region without neurogenic claudication: Secondary | ICD-10-CM | POA: Diagnosis not present

## 2016-08-01 MED ORDER — IOPAMIDOL (ISOVUE-M 200) INJECTION 41%
15.0000 mL | Freq: Once | INTRAMUSCULAR | Status: AC
  Administered 2016-08-01: 15 mL via INTRATHECAL

## 2016-08-01 MED ORDER — MEPERIDINE HCL 100 MG/ML IJ SOLN
75.0000 mg | Freq: Once | INTRAMUSCULAR | Status: AC
  Administered 2016-08-01: 75 mg via INTRAMUSCULAR

## 2016-08-01 MED ORDER — ONDANSETRON HCL 4 MG/2ML IJ SOLN
4.0000 mg | Freq: Once | INTRAMUSCULAR | Status: AC
  Administered 2016-08-01: 4 mg via INTRAMUSCULAR

## 2016-08-01 MED ORDER — DIAZEPAM 5 MG PO TABS
5.0000 mg | ORAL_TABLET | Freq: Once | ORAL | Status: AC
  Administered 2016-08-01: 5 mg via ORAL

## 2016-08-01 NOTE — Progress Notes (Signed)
Pt states he has been off Plavix for the past 5 days.

## 2016-08-01 NOTE — Discharge Instructions (Signed)

## 2016-08-05 ENCOUNTER — Ambulatory Visit (INDEPENDENT_AMBULATORY_CARE_PROVIDER_SITE_OTHER): Payer: Medicare Other | Admitting: *Deleted

## 2016-08-05 DIAGNOSIS — I495 Sick sinus syndrome: Secondary | ICD-10-CM | POA: Diagnosis not present

## 2016-08-05 DIAGNOSIS — I252 Old myocardial infarction: Secondary | ICD-10-CM | POA: Diagnosis not present

## 2016-08-05 DIAGNOSIS — Z950 Presence of cardiac pacemaker: Secondary | ICD-10-CM | POA: Diagnosis not present

## 2016-08-05 DIAGNOSIS — I119 Hypertensive heart disease without heart failure: Secondary | ICD-10-CM | POA: Diagnosis not present

## 2016-08-05 DIAGNOSIS — E785 Hyperlipidemia, unspecified: Secondary | ICD-10-CM | POA: Diagnosis not present

## 2016-08-05 DIAGNOSIS — Z955 Presence of coronary angioplasty implant and graft: Secondary | ICD-10-CM | POA: Diagnosis not present

## 2016-08-05 DIAGNOSIS — I209 Angina pectoris, unspecified: Secondary | ICD-10-CM | POA: Diagnosis not present

## 2016-08-05 DIAGNOSIS — I2510 Atherosclerotic heart disease of native coronary artery without angina pectoris: Secondary | ICD-10-CM | POA: Diagnosis not present

## 2016-08-05 NOTE — Progress Notes (Signed)
Remote pacemaker transmission.   

## 2016-08-06 DIAGNOSIS — H2511 Age-related nuclear cataract, right eye: Secondary | ICD-10-CM | POA: Diagnosis not present

## 2016-08-09 ENCOUNTER — Encounter: Payer: Self-pay | Admitting: Cardiology

## 2016-08-09 DIAGNOSIS — M5116 Intervertebral disc disorders with radiculopathy, lumbar region: Secondary | ICD-10-CM | POA: Diagnosis not present

## 2016-08-09 DIAGNOSIS — M898X6 Other specified disorders of bone, lower leg: Secondary | ICD-10-CM | POA: Diagnosis not present

## 2016-08-12 DIAGNOSIS — Z8546 Personal history of malignant neoplasm of prostate: Secondary | ICD-10-CM | POA: Diagnosis not present

## 2016-08-16 DIAGNOSIS — H02834 Dermatochalasis of left upper eyelid: Secondary | ICD-10-CM | POA: Diagnosis not present

## 2016-08-16 DIAGNOSIS — H02042 Spastic entropion of right lower eyelid: Secondary | ICD-10-CM | POA: Diagnosis not present

## 2016-08-16 DIAGNOSIS — H02831 Dermatochalasis of right upper eyelid: Secondary | ICD-10-CM | POA: Diagnosis not present

## 2016-08-16 DIAGNOSIS — H02532 Eyelid retraction right lower eyelid: Secondary | ICD-10-CM | POA: Diagnosis not present

## 2016-08-16 DIAGNOSIS — H02052 Trichiasis without entropian right lower eyelid: Secondary | ICD-10-CM | POA: Diagnosis not present

## 2016-08-17 ENCOUNTER — Other Ambulatory Visit: Payer: Self-pay | Admitting: Cardiology

## 2016-08-29 LAB — CUP PACEART REMOTE DEVICE CHECK
Battery Remaining Longevity: 116 mo
Battery Remaining Percentage: 95.5 %
Battery Voltage: 2.99 V
Brady Statistic AP VP Percent: 91 %
Brady Statistic AP VS Percent: 1 %
Brady Statistic AS VP Percent: 8 %
Brady Statistic AS VS Percent: 1 %
Brady Statistic RA Percent Paced: 90 %
Brady Statistic RV Percent Paced: 99 %
Date Time Interrogation Session: 20171211082951
Implantable Lead Implant Date: 20160329
Implantable Lead Implant Date: 20160329
Implantable Lead Location: 753859
Implantable Lead Location: 753860
Implantable Lead Model: 1948
Implantable Pulse Generator Implant Date: 20160329
Lead Channel Impedance Value: 390 Ohm
Lead Channel Impedance Value: 660 Ohm
Lead Channel Pacing Threshold Amplitude: 0.5 V
Lead Channel Pacing Threshold Amplitude: 0.625 V
Lead Channel Pacing Threshold Pulse Width: 0.4 ms
Lead Channel Pacing Threshold Pulse Width: 0.4 ms
Lead Channel Sensing Intrinsic Amplitude: 12 mV
Lead Channel Sensing Intrinsic Amplitude: 3.4 mV
Lead Channel Setting Pacing Amplitude: 0.875
Lead Channel Setting Pacing Amplitude: 2.5 V
Lead Channel Setting Pacing Pulse Width: 0.4 ms
Lead Channel Setting Sensing Sensitivity: 2.5 mV
Pulse Gen Model: 2240
Pulse Gen Serial Number: 7732903

## 2016-08-30 ENCOUNTER — Other Ambulatory Visit: Payer: Self-pay | Admitting: Neurosurgery

## 2016-08-30 DIAGNOSIS — M5126 Other intervertebral disc displacement, lumbar region: Secondary | ICD-10-CM | POA: Diagnosis not present

## 2016-08-30 DIAGNOSIS — Q762 Congenital spondylolisthesis: Secondary | ICD-10-CM | POA: Diagnosis not present

## 2016-08-30 DIAGNOSIS — M5416 Radiculopathy, lumbar region: Secondary | ICD-10-CM | POA: Diagnosis not present

## 2016-08-30 DIAGNOSIS — M5136 Other intervertebral disc degeneration, lumbar region: Secondary | ICD-10-CM | POA: Diagnosis not present

## 2016-08-30 DIAGNOSIS — M48062 Spinal stenosis, lumbar region with neurogenic claudication: Secondary | ICD-10-CM | POA: Diagnosis not present

## 2016-08-30 DIAGNOSIS — M4726 Other spondylosis with radiculopathy, lumbar region: Secondary | ICD-10-CM | POA: Diagnosis not present

## 2016-08-30 DIAGNOSIS — M546 Pain in thoracic spine: Secondary | ICD-10-CM | POA: Diagnosis not present

## 2016-09-06 IMAGING — CR DG CHEST 2V
2 series · 2 of 2 positions shown · non-contrast
Comparison: None.

CLINICAL DATA: Pacemaker insertion .

EXAM:
CHEST  2 VIEW

[view not recorded (1 of 2)]
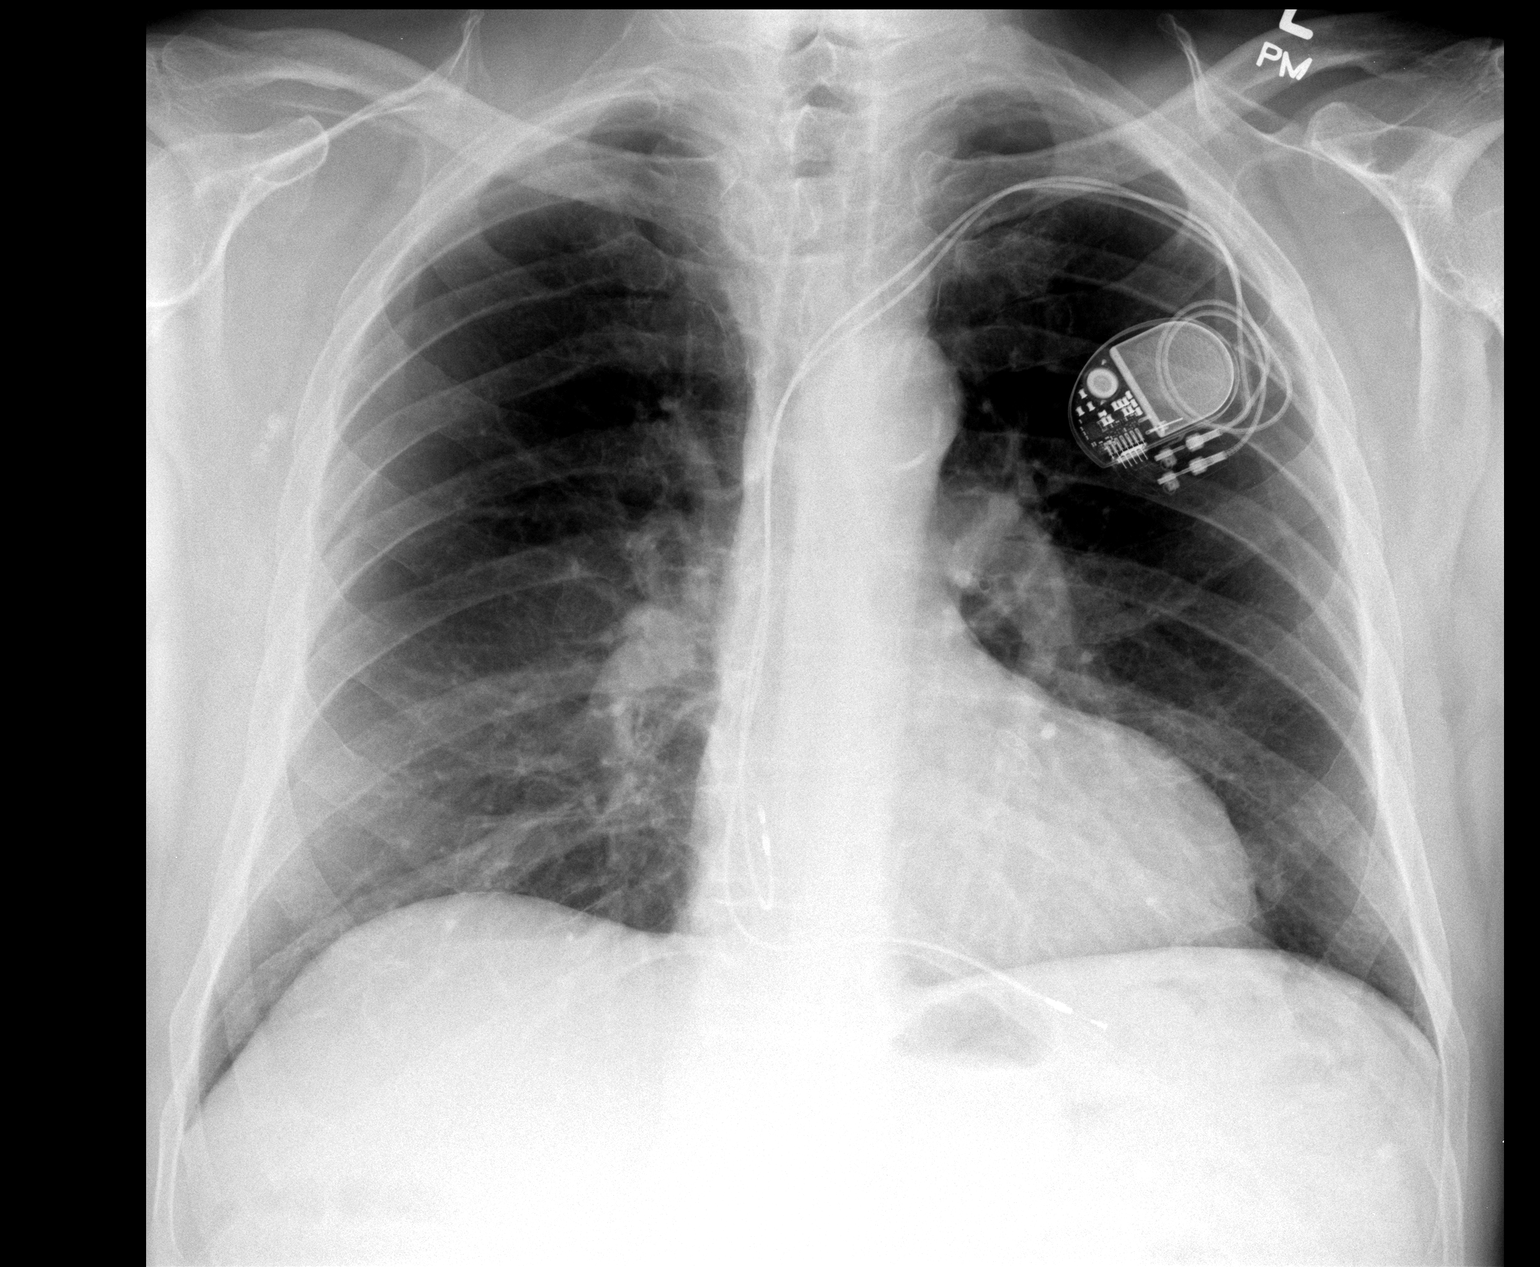

[view not recorded (2 of 2)]
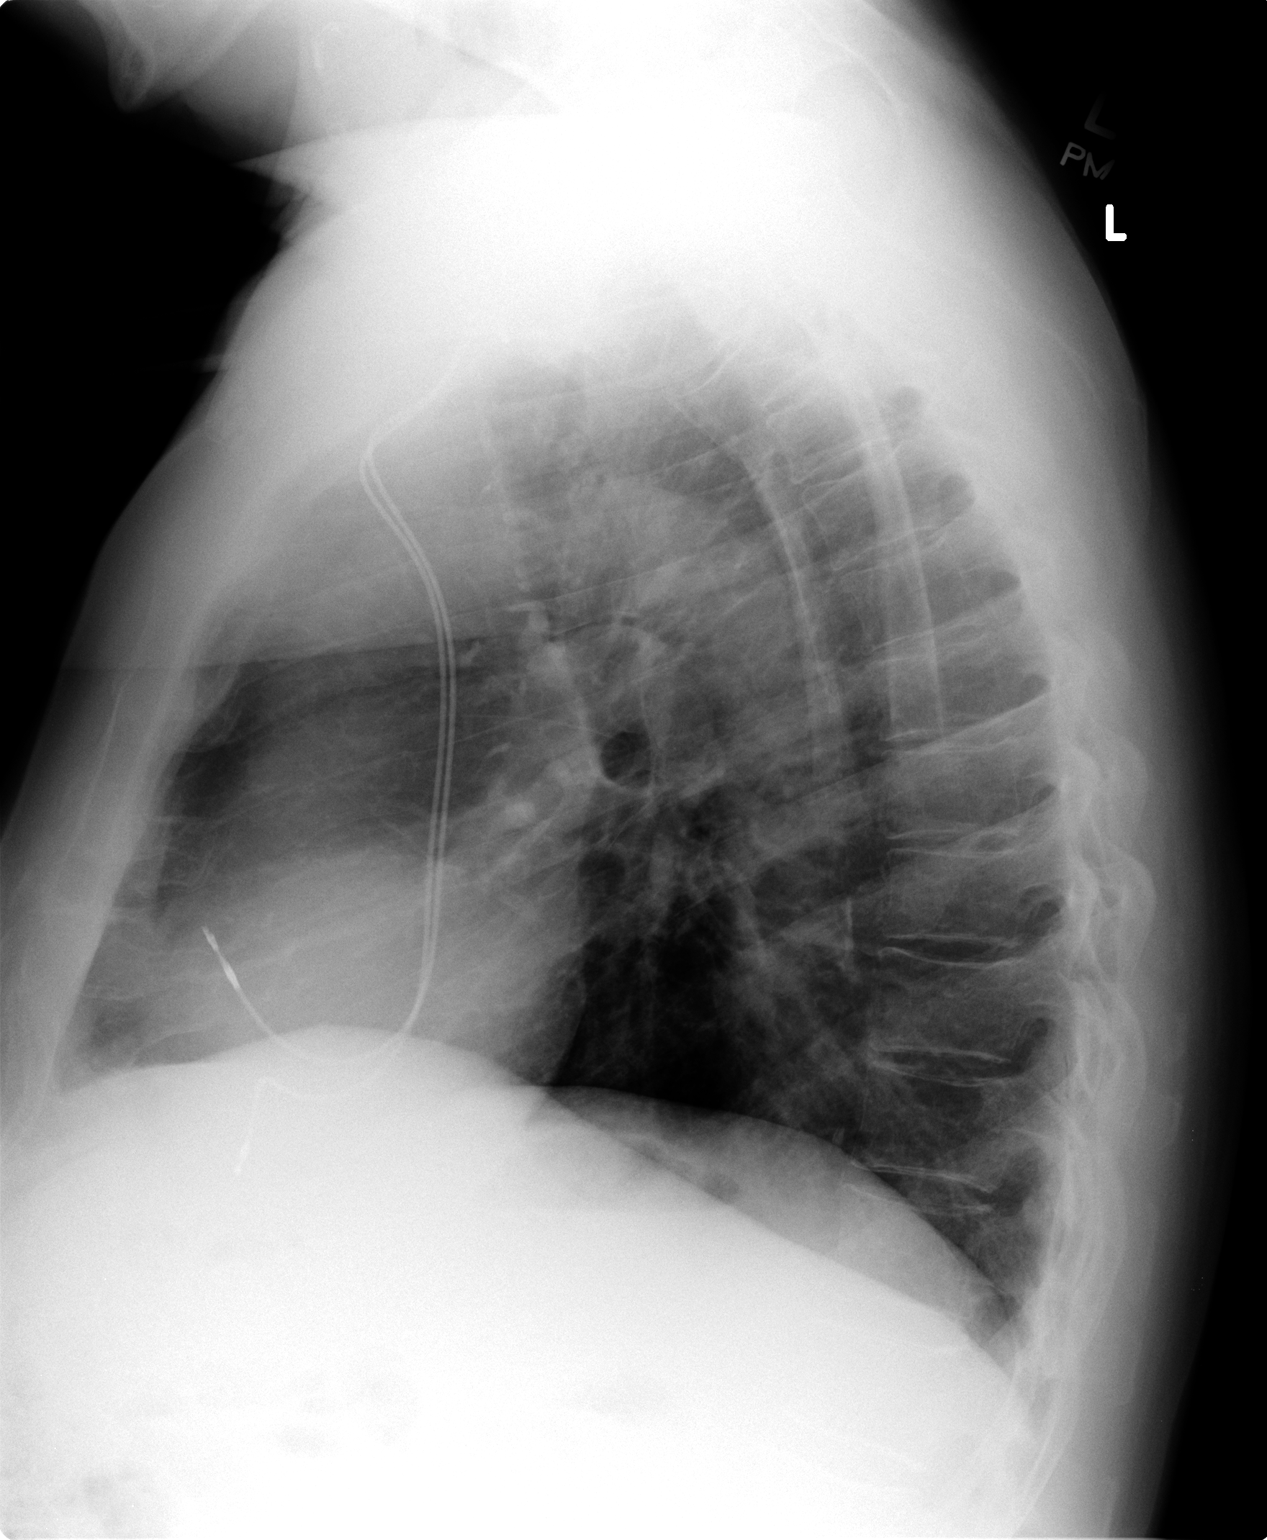

[2 of 2 positions shown; findings below may reference images not displayed]

FINDINGS: Cardiac pacer with lead tips in right atrium and right ventricle.
Mild cardiomegaly. Normal pulmonary vascularity. No focal pulmonary
infiltrate. No pleural effusion or pneumothorax. Degenerative
changes thoracic spine. Stable calcifications in the right axilla
consistent with calcified lymph nodes.
IMPRESSION: 1. Cardiac pacer noted lead tips in right atrium right ventricle.
2. Stable cardiomegaly.  No congestive heart failure.

## 2016-09-10 ENCOUNTER — Encounter (HOSPITAL_COMMUNITY)
Admission: RE | Admit: 2016-09-10 | Discharge: 2016-09-10 | Disposition: A | Discharge status: Home / Self Care | Payer: Medicare Other | Source: Ambulatory Visit | Attending: Neurosurgery | Admitting: Neurosurgery

## 2016-09-10 ENCOUNTER — Encounter (HOSPITAL_COMMUNITY): Payer: Self-pay

## 2016-09-10 DIAGNOSIS — Z01818 Encounter for other preprocedural examination: Secondary | ICD-10-CM | POA: Insufficient documentation

## 2016-09-10 DIAGNOSIS — M5106 Intervertebral disc disorders with myelopathy, lumbar region: Secondary | ICD-10-CM | POA: Insufficient documentation

## 2016-09-10 HISTORY — DX: Atrioventricular block, complete: I44.2

## 2016-09-10 LAB — CBC
HCT: 34.9 % — ABNORMAL LOW (ref 39.0–52.0)
Hemoglobin: 11.9 g/dL — ABNORMAL LOW (ref 13.0–17.0)
MCH: 31 pg (ref 26.0–34.0)
MCHC: 34.1 g/dL (ref 30.0–36.0)
MCV: 90.9 fL (ref 78.0–100.0)
Platelets: 196 10*3/uL (ref 150–400)
RBC: 3.84 MIL/uL — ABNORMAL LOW (ref 4.22–5.81)
RDW: 14.6 % (ref 11.5–15.5)
WBC: 9.6 10*3/uL (ref 4.0–10.5)

## 2016-09-10 LAB — BASIC METABOLIC PANEL
Anion gap: 7 (ref 5–15)
BUN: 21 mg/dL — ABNORMAL HIGH (ref 6–20)
CO2: 24 mmol/L (ref 22–32)
Calcium: 9 mg/dL (ref 8.9–10.3)
Chloride: 107 mmol/L (ref 101–111)
Creatinine, Ser: 0.82 mg/dL (ref 0.61–1.24)
GFR calc Af Amer: >60 mL/min (ref >60)
GFR calc non Af Amer: >60 mL/min (ref >60)
Glucose, Bld: 127 mg/dL — ABNORMAL HIGH (ref 65–99)
Potassium: 4.4 mmol/L (ref 3.5–5.1)
Sodium: 138 mmol/L (ref 135–145)

## 2016-09-10 LAB — PROTIME-INR
INR: 1.01
Prothrombin Time: 13.3 seconds (ref 11.4–15.2)

## 2016-09-10 LAB — SURGICAL PCR SCREEN
MRSA, PCR: NEGATIVE
Staphylococcus aureus: NEGATIVE

## 2016-09-10 NOTE — Pre-Procedure Instructions (Addendum)
Andrew Marshall  09/10/2016      [facility] [address] [place] [address] Phone: [phone] Fax: [phone]    Your procedure is scheduled on 09/18/16.  Report to [facility] Admitting at 745 A.M.  Call this number if you have problems the morning of surgery:  [phone]   Remember:  Do not eat food or drink liquids after midnight.  Take these medicines the morning of surgery with A SIP OF WATER      Carvedilol(coreg),famitodine(pepcid),nitro if needed, eye drops if needed  STOP all herbel meds, nsaids (aleve,naproxen,advil,ibuprofen) 7 days prior to surgery starting 09/11/16 tomorrow including vit d, coq10,multi vit, fish oil, reseratrol,   Stop Aspirin, plavix per dr   Lazaro Arms not wear jewelry, make-up or nail polish.  Do not wear lotions, powders, or perfumes, or deoderant.  Do not shave 48 hours prior to surgery.  Men may shave face and neck.  Do not bring valuables to the hospital.  [facility] is not responsible for any belongings or valuables.  Contacts, dentures or bridgework may not be worn into surgery.  Leave your suitcase in the car.  After surgery it may be brought to your room.  For patients admitted to the hospital, discharge time will be determined by your treatment team.  Patients discharged the day of surgery will not be allowed to drive home.   Special instructions:   Special Instructions: [place] - Preparing for Surgery  Before surgery, you can play an important role.  Because skin is not sterile, your skin needs to be as free of germs as possible.  You can reduce the number of germs on you skin by washing with CHG (chlorahexidine gluconate) soap before surgery.  CHG is an antiseptic cleaner which kills germs and bonds with the skin to continue killing germs even after washing.  Please DO NOT use if you have an allergy to CHG or antibacterial soaps.  If your skin  becomes reddened/irritated stop using the CHG and inform your nurse when you arrive at Short Stay.  Do not shave (including legs and underarms) for at least 48 hours prior to the first CHG shower.  You may shave your face.  Please follow these instructions carefully:   1.  Shower with CHG Soap the night before surgery and the morning of Surgery.  2.  If you choose to wash your hair, wash your hair first as usual with your normal shampoo.  3.  After you shampoo, rinse your hair and body thoroughly to remove the Shampoo.  4.  Use CHG as you would any other liquid soap.  You can apply chg directly  to the skin and wash gently with scrungie or a clean washcloth.  5.  Apply the CHG Soap to your body ONLY FROM THE NECK DOWN.  Do not use on open wounds or open sores.  Avoid contact with your eyes ears, mouth and genitals (private parts).  Wash genitals (private parts)       with your normal soap.  6.  Wash thoroughly, paying special attention to the area where your surgery will be performed.  7.  Thoroughly rinse your body with warm water from the neck down.  8.  DO NOT shower/wash with your normal soap after using and rinsing off the CHG Soap.  9.  Pat yourself dry with a clean towel.  10.  Wear clean pajamas.            11.  Place clean sheets on your bed the night of your first shower and do not sleep with pets.  Day of Surgery  Do not apply any lotions/deodorants the morning of surgery.  Please wear clean clothes to the hospital/surgery center.  Please read over the  fact sheets that you were given.

## 2016-09-13 ENCOUNTER — Encounter (HOSPITAL_COMMUNITY): Payer: Self-pay

## 2016-09-13 NOTE — Progress Notes (Addendum)
Anesthesia Chart Review:  Pt is an 83 year old male scheduled for left L3-4 lumbar laminotomy and microdiscectomy on 09/18/2016 with Jovita Gamma, MD  - Cardiologist is Tollie Eth, MD, last office visit 08/05/16, who cleared pt for surgery.  - EP cardiologist is [name], MD, last office visit 05/06/16  PMH includes:  CAD (DES to CX 2016; prior stenting to CX), atrial fibrillation, mitral insufficiency, complete heart block, sick sinus syndrome, pacemaker ([employer] implanted 11/22/14), HTN, hyperlipidemia, prostate cancer, laryngeal cancer. Former smoker. BMI 25  Medications include: ASA, lipitor, carvedilol, plavix, pepcid, sildenafil, valsartan. Last dose ASA and plavix 09/04/16  Preoperative labs reviewed.    EKG 09/10/16: Atrial-paced rhythm. LAD. LBBB.   Nuclear stress test 03/14/16 [facility]):  1. Abnormal Lexiscan Myoview scan with evidence of a prior lateral wall MI but no ischemia. 2. Abnormal quantitative gated SPECT EF of 38% with lateral wall hypokinesis. 3. Recommendations: Patient has no evidence of ischemia at this time. He will have further investigation of his pacer function. Repeat echo as appears to have dropped EF from prior echo prior to pacer placement. --  Spoke with Juliann Pulse from Dr. Thurman Coyer office. Echo has not yet been done but it does not need to be done prior to surgery.   Carotid duplex 12/08/15: 1-39% bilateral ICA stenosis.  Cardiac cath 05/05/15:  1. Prox LAD to Mid LAD lesion, 25% stenosed. 2. Prox Cx lesion, 90% stenosed. 3. Prox Cx to Mid Cx lesion, 90% stenosed. There is a 20% residual stenosis post intervention. The lesion was previously treated with a stent (unknown type) . 4. A drug-eluting stent was placed. 5. Prox RCA to Mid RCA lesion, 30% stenosed.   High-grade obstruction of the proximal circumflex within the treatment zone of prior Brachy therapy  (2003) and just proximal to the previously placed stents. This stenosis accounts for  the patient's presentation. Otherwise widely patent coronary arteries.  Successful PTCA and stenting of the proximal circumflex from greater than 90% to less than 20%. Preservation of the large first obtuse marginal side branch.  Mildly depressed LV systolic function, EF [id number].  Recommendations: Prolonged dual antiplatelet therapy given prior history of brachytherapy.   Echo 11/22/14:  - Left ventricle: The cavity size was normal. Wall thickness wasincreased in a pattern of mild LVH. Systolic function was normal.The estimated ejection fraction was in the range of 55% to 60%.Doppler parameters are consistent with abnormal left ventricular relaxation (grade 1 diastolic dysfunction). The E/e&' ratio isbetween 8-15, suggesting indeterminate LV filling pressure. - Aortic valve: Mildly calcified with leaflet restriction. Mild aortic stenosis - AVA around 1.6 cm2. Trileaflet. Mean gradient(S): 12 mm Hg. Peak gradient (S): 24 mm Hg. Valve area (VTI):1.48 cm^2. Valve area (Vmax): 1.15 cm^2. Valve area (Vmean): 1.47cm^2. - Mitral valve: Thickened leaflets. Calcified annulus. There wasmoderate regurgitation. - Left atrium: Severely dilated at 59 ml/m2. - Right ventricle: The cavity size was normal. Wall thickness wasnormal. Systolic function is reduced. - Right atrium: The atrium was mildly dilated. - Tricuspid valve: There was mild regurgitation. - Pulmonary arteries: PA peak pressure: 36 mm Hg (S). - Inferior vena cava: The vessel was normal in size. The respirophasic diameter changes were in the normal range (>= 50%),consistent with normal central venous pressure.  If no changes, I anticipate pt can proceed with surgery as scheduled.   Willeen Cass, FNP-BC [facility]/Anesthesiology Phone: [phone] 09/17/2016 1:00 PM

## 2016-09-17 NOTE — Anesthesia Preprocedure Evaluation (Addendum)
Anesthesia Evaluation  Patient identified by MRN, date of birth, ID band Patient awake  General Assessment Comment:PMH includes:  CAD (DES to CX 2016; prior stenting to CX), atrial fibrillation, mitral insufficiency, complete heart block, sick sinus syndrome, pacemaker (St. Jude implanted 11/22/14), HTN, hyperlipidemia, prostate cancer, laryngeal cancer. Former smoker. BMI 25  Reviewed: Allergy & Precautions, NPO status , Patient's Chart, lab work & pertinent test results, reviewed documented beta blocker date and time   Airway Mallampati: III  TM Distance: >3 FB Neck ROM: Full    Dental  (+) Teeth Intact, Dental Advisory Given, Implants   Pulmonary former smoker,    Pulmonary exam normal breath sounds clear to auscultation       Cardiovascular hypertension, Pt. on home beta blockers and Pt. on medications + CAD, + Past MI and + Cardiac Stents  Normal cardiovascular exam+ dysrhythmias (complete heart block) Atrial Fibrillation + pacemaker + Valvular Problems/Murmurs MR  Rhythm:Regular Rate:Normal  EKG 09/10/16: Atrial-paced rhythm. LAD. LBBB.   Nuclear stress test 03/14/16 [facility]):  1. Abnormal Lexiscan Myoview scan with evidence of a prior lateral wall MI but no ischemia. 2. Abnormal quantitative gated SPECT EF of 38% with lateral wall hypokinesis. 3. Recommendations: Patient has no evidence of ischemia at this time. He will have further investigation of his pacer function. Repeat echo as appears to have dropped EF from prior echo prior to pacer placement. --  Spoke with Juliann Pulse from Dr. Thurman Coyer office. Echo has not yet been done but it does not need to be done prior to surgery.   Carotid duplex 12/08/15: 1-39% bilateral ICA stenosis.  Cardiac cath 05/05/15:  1. Prox LAD to Mid LAD lesion, 25% stenosed. 2. Prox Cx lesion, 90% stenosed. 3. Prox Cx to Mid Cx lesion, 90% stenosed. There is a 20% residual stenosis post  intervention. The lesion was previously treated with a stent (unknown type) . 4. A drug-eluting stent was placed. 5. Prox RCA to Mid RCA lesion, 30% stenosed.   High-grade obstruction of the proximal circumflex within the treatment zone of prior Brachy therapy (2003) and just proximal to the previously placed stents. This stenosis accounts for the patient's presentation. Otherwise widely patent coronary arteries.  Successful PTCA and stenting of the proximal circumflex from greater than 90% to less than 20%. Preservation of the large first obtuse marginal side branch.  Mildly depressed LV systolic function, EF [id number].  Recommendations: Prolonged dual antiplatelet therapy given prior history of brachytherapy.   Echo 11/22/14:  - Left ventricle: The cavity size was normal. Wall thickness wasincreased in a pattern of mild LVH. Systolic function was normal.The estimated ejection fraction was in the range of 55% to 60%.Doppler parameters are consistent with abnormal left ventricular relaxation (grade 1 diastolic dysfunction). The E/e&' ratio isbetween 8-15, suggesting indeterminate LV filling pressure. - Aortic valve: Mildly calcified with leaflet restriction. Mild aortic stenosis - AVA around 1.6 cm2. Trileaflet. Mean gradient(S): 12 mm Hg. Peak gradient (S): 24 mm Hg. Valve area (VTI):1.48 cm^2. Valve area (Vmax): 1.15 cm^2. Valve area (Vmean): 1.47cm^2. - Mitral valve: Thickened leaflets. Calcified annulus. There wasmoderate regurgitation. - Left atrium: Severely dilated at 59 ml/m2. - Right ventricle: The cavity size was normal. Wall thickness wasnormal. Systolic function is reduced. - Right atrium: The atrium was mildly dilated. - Tricuspid valve: There was mild regurgitation. - Pulmonary arteries: PA peak pressure: 36 mm Hg (S). - Inferior vena cava: The vessel was normal in size. The respirophasic diameter changes were in the normal range (>=  50%),consistent with normal central  venous pressure.   Neuro/Psych negative neurological ROS  negative psych ROS   GI/Hepatic Neg liver ROS, GERD  Medicated,H/o diverticular bleed   Endo/Other  negative endocrine ROS  Renal/GU negative Renal ROS     Musculoskeletal  (+) Arthritis , Osteoarthritis,    Abdominal   Peds  Hematology  (+) Blood dyscrasia (Plavix therapy), anemia ,   Anesthesia Other Findings Day of surgery medications reviewed with the patient.  Reproductive/Obstetrics                            Anesthesia Physical Anesthesia Plan  ASA: III  Anesthesia Plan: General   Post-op Pain Management:    Induction: Intravenous  Airway Management Planned: Oral ETT  Additional Equipment:   Intra-op Plan:   Post-operative Plan: Extubation in OR  Informed Consent: I have reviewed the patients History and Physical, chart, labs and discussed the procedure including the risks, benefits and alternatives for the proposed anesthesia with the patient or authorized representative who has indicated his/her understanding and acceptance.   Dental advisory given  Plan Discussed with: CRNA  Anesthesia Plan Comments: (Risks/benefits of general anesthesia discussed with patient including risk of damage to teeth, lips, gum, and tongue, nausea/vomiting, allergic reactions to medications, and the possibility of heart attack, stroke and death.  All patient questions answered.  Patient wishes to proceed.  2nd IV after induction. No versed. Magnet available in the room.)       Anesthesia Quick Evaluation

## 2016-09-18 ENCOUNTER — Ambulatory Visit (HOSPITAL_COMMUNITY): Payer: Medicare Other | Admitting: Emergency Medicine

## 2016-09-18 ENCOUNTER — Observation Stay (HOSPITAL_COMMUNITY)
Admission: RE | Admit: 2016-09-18 | Discharge: 2016-09-19 | Disposition: A | Discharge status: Home / Self Care | Payer: Medicare Other | Source: Ambulatory Visit | Attending: Neurosurgery | Admitting: Neurosurgery

## 2016-09-18 ENCOUNTER — Encounter (HOSPITAL_COMMUNITY): Payer: Self-pay | Admitting: *Deleted

## 2016-09-18 ENCOUNTER — Ambulatory Visit: Admit: 2016-09-18 | Payer: Medicare Other | Admitting: Orthopedic Surgery

## 2016-09-18 ENCOUNTER — Ambulatory Visit (HOSPITAL_COMMUNITY): Payer: Medicare Other | Admitting: Anesthesiology

## 2016-09-18 ENCOUNTER — Encounter (HOSPITAL_COMMUNITY)
Admission: RE | Disposition: A | Discharge status: Home / Self Care | Payer: Self-pay | Source: Ambulatory Visit | Attending: Neurosurgery

## 2016-09-18 ENCOUNTER — Observation Stay (HOSPITAL_COMMUNITY): Payer: Medicare Other

## 2016-09-18 DIAGNOSIS — M5116 Intervertebral disc disorders with radiculopathy, lumbar region: Secondary | ICD-10-CM | POA: Diagnosis not present

## 2016-09-18 DIAGNOSIS — I442 Atrioventricular block, complete: Secondary | ICD-10-CM | POA: Diagnosis not present

## 2016-09-18 DIAGNOSIS — I252 Old myocardial infarction: Secondary | ICD-10-CM | POA: Diagnosis not present

## 2016-09-18 DIAGNOSIS — M48061 Spinal stenosis, lumbar region without neurogenic claudication: Secondary | ICD-10-CM | POA: Diagnosis not present

## 2016-09-18 DIAGNOSIS — E7800 Pure hypercholesterolemia, unspecified: Secondary | ICD-10-CM | POA: Insufficient documentation

## 2016-09-18 DIAGNOSIS — I10 Essential (primary) hypertension: Secondary | ICD-10-CM | POA: Diagnosis not present

## 2016-09-18 DIAGNOSIS — Z955 Presence of coronary angioplasty implant and graft: Secondary | ICD-10-CM | POA: Diagnosis not present

## 2016-09-18 DIAGNOSIS — M2408 Loose body, other site: Secondary | ICD-10-CM | POA: Insufficient documentation

## 2016-09-18 DIAGNOSIS — I340 Nonrheumatic mitral (valve) insufficiency: Secondary | ICD-10-CM | POA: Diagnosis not present

## 2016-09-18 DIAGNOSIS — I2583 Coronary atherosclerosis due to lipid rich plaque: Secondary | ICD-10-CM | POA: Insufficient documentation

## 2016-09-18 DIAGNOSIS — M5126 Other intervertebral disc displacement, lumbar region: Secondary | ICD-10-CM | POA: Diagnosis not present

## 2016-09-18 DIAGNOSIS — M9973 Connective tissue and disc stenosis of intervertebral foramina of lumbar region: Secondary | ICD-10-CM | POA: Diagnosis not present

## 2016-09-18 DIAGNOSIS — Z950 Presence of cardiac pacemaker: Secondary | ICD-10-CM | POA: Insufficient documentation

## 2016-09-18 DIAGNOSIS — I25110 Atherosclerotic heart disease of native coronary artery with unstable angina pectoris: Secondary | ICD-10-CM | POA: Insufficient documentation

## 2016-09-18 DIAGNOSIS — E785 Hyperlipidemia, unspecified: Secondary | ICD-10-CM | POA: Diagnosis not present

## 2016-09-18 DIAGNOSIS — Z7982 Long term (current) use of aspirin: Secondary | ICD-10-CM | POA: Insufficient documentation

## 2016-09-18 DIAGNOSIS — K219 Gastro-esophageal reflux disease without esophagitis: Secondary | ICD-10-CM | POA: Diagnosis not present

## 2016-09-18 DIAGNOSIS — M4726 Other spondylosis with radiculopathy, lumbar region: Secondary | ICD-10-CM | POA: Insufficient documentation

## 2016-09-18 DIAGNOSIS — R197 Diarrhea, unspecified: Secondary | ICD-10-CM | POA: Diagnosis not present

## 2016-09-18 DIAGNOSIS — M519 Unspecified thoracic, thoracolumbar and lumbosacral intervertebral disc disorder: Secondary | ICD-10-CM | POA: Diagnosis present

## 2016-09-18 DIAGNOSIS — Z419 Encounter for procedure for purposes other than remedying health state, unspecified: Secondary | ICD-10-CM

## 2016-09-18 DIAGNOSIS — R000 Tachycardia, unspecified: Secondary | ICD-10-CM | POA: Diagnosis not present

## 2016-09-18 DIAGNOSIS — Z7902 Long term (current) use of antithrombotics/antiplatelets: Secondary | ICD-10-CM | POA: Diagnosis not present

## 2016-09-18 DIAGNOSIS — Z87891 Personal history of nicotine dependence: Secondary | ICD-10-CM | POA: Insufficient documentation

## 2016-09-18 DIAGNOSIS — Z79891 Long term (current) use of opiate analgesic: Secondary | ICD-10-CM | POA: Diagnosis not present

## 2016-09-18 DIAGNOSIS — M47896 Other spondylosis, lumbar region: Secondary | ICD-10-CM | POA: Insufficient documentation

## 2016-09-18 DIAGNOSIS — I495 Sick sinus syndrome: Secondary | ICD-10-CM | POA: Diagnosis not present

## 2016-09-18 DIAGNOSIS — Z79899 Other long term (current) drug therapy: Secondary | ICD-10-CM | POA: Diagnosis not present

## 2016-09-18 HISTORY — PX: LUMBAR LAMINECTOMY/DECOMPRESSION MICRODISCECTOMY: SHX5026

## 2016-09-18 SURGERY — LUMBAR LAMINECTOMY/DECOMPRESSION MICRODISCECTOMY
Anesthesia: General

## 2016-09-18 SURGERY — LUMBAR LAMINECTOMY/DECOMPRESSION MICRODISCECTOMY 1 LEVEL
Anesthesia: General | Site: Spine Lumbar | Laterality: Left

## 2016-09-18 MED ORDER — KETOROLAC TROMETHAMINE 30 MG/ML IJ SOLN
15.0000 mg | Freq: Four times a day (QID) | INTRAMUSCULAR | Status: DC
  Administered 2016-09-18 – 2016-09-19 (×3): 15 mg via INTRAVENOUS
  Filled 2016-09-18 (×3): qty 1

## 2016-09-18 MED ORDER — ACETAMINOPHEN 10 MG/ML IV SOLN
INTRAVENOUS | Status: AC
  Filled 2016-09-18: qty 100

## 2016-09-18 MED ORDER — THROMBIN 5000 UNITS EX SOLR
CUTANEOUS | Status: AC
  Filled 2016-09-18: qty 10000

## 2016-09-18 MED ORDER — SUGAMMADEX SODIUM 200 MG/2ML IV SOLN
INTRAVENOUS | Status: DC | PRN
  Administered 2016-09-18: 200 mg via INTRAVENOUS

## 2016-09-18 MED ORDER — PHENYLEPHRINE HCL 10 MG/ML IJ SOLN
INTRAVENOUS | Status: DC | PRN
  Administered 2016-09-18: 25 ug/min via INTRAVENOUS

## 2016-09-18 MED ORDER — ALUM & MAG HYDROXIDE-SIMETH 200-200-20 MG/5ML PO SUSP: 30.0000 mL | Freq: Four times a day (QID) | ORAL | Status: DC | PRN

## 2016-09-18 MED ORDER — BUPIVACAINE HCL (PF) 0.5 % IJ SOLN
INTRAMUSCULAR | Status: DC | PRN
  Administered 2016-09-18: 10 mL

## 2016-09-18 MED ORDER — SODIUM CHLORIDE 0.9 % IR SOLN
Status: DC | PRN
  Administered 2016-09-18: 11:00:00

## 2016-09-18 MED ORDER — MENTHOL 3 MG MT LOZG: 1.0000 | LOZENGE | OROMUCOSAL | Status: DC | PRN

## 2016-09-18 MED ORDER — KETOROLAC TROMETHAMINE 15 MG/ML IJ SOLN
INTRAMUSCULAR | Status: AC
  Administered 2016-09-18: 15 mg
  Filled 2016-09-18: qty 1

## 2016-09-18 MED ORDER — COENZYME Q-10 100 MG PO CAPS: 100.0000 mg | ORAL_CAPSULE | Freq: Every evening | ORAL | Status: DC

## 2016-09-18 MED ORDER — RESVERATROL 250 MG PO CAPS: 250.0000 mg | ORAL_CAPSULE | Freq: Every evening | ORAL | Status: DC

## 2016-09-18 MED ORDER — NITROGLYCERIN 0.4 MG SL SUBL: 0.4000 mg | SUBLINGUAL_TABLET | SUBLINGUAL | Status: DC | PRN

## 2016-09-18 MED ORDER — FENTANYL CITRATE (PF) 100 MCG/2ML IJ SOLN
INTRAMUSCULAR | Status: DC | PRN
  Administered 2016-09-18: 100 ug via INTRAVENOUS

## 2016-09-18 MED ORDER — CEFAZOLIN SODIUM-DEXTROSE 2-4 GM/100ML-% IV SOLN
2.0000 g | INTRAVENOUS | Status: AC
  Administered 2016-09-18: 2 g via INTRAVENOUS

## 2016-09-18 MED ORDER — BISACODYL 10 MG RE SUPP: 10.0000 mg | Freq: Every day | RECTAL | Status: DC | PRN

## 2016-09-18 MED ORDER — ACETAMINOPHEN 10 MG/ML IV SOLN
1000.0000 mg | Freq: Once | INTRAVENOUS | Status: AC
  Administered 2016-09-18: 1000 mg via INTRAVENOUS
  Filled 2016-09-18: qty 100

## 2016-09-18 MED ORDER — KCL IN DEXTROSE-NACL 20-5-0.45 MEQ/L-%-% IV SOLN: INTRAVENOUS | Status: DC

## 2016-09-18 MED ORDER — ONDANSETRON HCL 4 MG/2ML IJ SOLN
4.0000 mg | Freq: Four times a day (QID) | INTRAMUSCULAR | Status: DC | PRN
  Administered 2016-09-18: 4 mg via INTRAVENOUS
  Filled 2016-09-18 (×2): qty 2

## 2016-09-18 MED ORDER — DEXAMETHASONE SODIUM PHOSPHATE 10 MG/ML IJ SOLN
INTRAMUSCULAR | Status: DC | PRN
  Administered 2016-09-18: 8 mg via INTRAVENOUS

## 2016-09-18 MED ORDER — 0.9 % SODIUM CHLORIDE (POUR BTL) OPTIME
TOPICAL | Status: DC | PRN
  Administered 2016-09-18: 1000 mL

## 2016-09-18 MED ORDER — CYCLOBENZAPRINE HCL 5 MG PO TABS: 5.0000 mg | ORAL_TABLET | Freq: Three times a day (TID) | ORAL | Status: DC | PRN

## 2016-09-18 MED ORDER — MAGNESIUM HYDROXIDE 400 MG/5ML PO SUSP: 30.0000 mL | Freq: Every day | ORAL | Status: DC | PRN

## 2016-09-18 MED ORDER — MORPHINE SULFATE (PF) 4 MG/ML IV SOLN: 4.0000 mg | INTRAVENOUS | Status: DC | PRN

## 2016-09-18 MED ORDER — CHLORHEXIDINE GLUCONATE CLOTH 2 % EX PADS: 6.0000 | MEDICATED_PAD | Freq: Once | CUTANEOUS | Status: DC

## 2016-09-18 MED ORDER — FENTANYL CITRATE (PF) 100 MCG/2ML IJ SOLN
INTRAMUSCULAR | Status: AC
  Filled 2016-09-18: qty 4

## 2016-09-18 MED ORDER — PROPOFOL 10 MG/ML IV BOLUS
INTRAVENOUS | Status: DC | PRN
  Administered 2016-09-18: 150 mg via INTRAVENOUS

## 2016-09-18 MED ORDER — BUPIVACAINE HCL (PF) 0.5 % IJ SOLN
INTRAMUSCULAR | Status: AC
  Filled 2016-09-18: qty 30

## 2016-09-18 MED ORDER — LACTATED RINGERS IV SOLN
INTRAVENOUS | Status: DC
  Administered 2016-09-18 (×2): via INTRAVENOUS

## 2016-09-18 MED ORDER — PROPOFOL 10 MG/ML IV BOLUS
INTRAVENOUS | Status: AC
  Filled 2016-09-18: qty 20

## 2016-09-18 MED ORDER — ATORVASTATIN CALCIUM 40 MG PO TABS
40.0000 mg | ORAL_TABLET | Freq: Every day | ORAL | Status: DC
  Administered 2016-09-18: 40 mg via ORAL
  Filled 2016-09-18: qty 2
  Filled 2016-09-18: qty 1

## 2016-09-18 MED ORDER — ONDANSETRON HCL 4 MG/2ML IJ SOLN
4.0000 mg | Freq: Four times a day (QID) | INTRAMUSCULAR | Status: DC | PRN
Start: 2016-09-18 — End: 2016-09-19

## 2016-09-18 MED ORDER — FENTANYL CITRATE (PF) 100 MCG/2ML IJ SOLN
25.0000 ug | INTRAMUSCULAR | Status: DC | PRN
  Administered 2016-09-18 (×2): 25 ug via INTRAVENOUS
  Administered 2016-09-18: 50 ug via INTRAVENOUS

## 2016-09-18 MED ORDER — KETOROLAC TROMETHAMINE 30 MG/ML IJ SOLN: 15.0000 mg | Freq: Once | INTRAMUSCULAR | Status: AC

## 2016-09-18 MED ORDER — HEMOSTATIC AGENTS (NO CHARGE) OPTIME
TOPICAL | Status: DC | PRN
  Administered 2016-09-18: 1 via TOPICAL

## 2016-09-18 MED ORDER — ACETAMINOPHEN 650 MG RE SUPP: 650.0000 mg | RECTAL | Status: DC | PRN

## 2016-09-18 MED ORDER — ROCURONIUM BROMIDE 100 MG/10ML IV SOLN
INTRAVENOUS | Status: DC | PRN
  Administered 2016-09-18: 50 mg via INTRAVENOUS

## 2016-09-18 MED ORDER — PHENYLEPHRINE HCL 10 MG/ML IJ SOLN
INTRAMUSCULAR | Status: DC | PRN
Start: 2016-09-18 — End: 2016-09-18
  Administered 2016-09-18: 80 ug via INTRAVENOUS

## 2016-09-18 MED ORDER — THROMBIN 5000 UNITS EX SOLR
CUTANEOUS | Status: DC | PRN
  Administered 2016-09-18 (×2): 5000 [IU] via TOPICAL

## 2016-09-18 MED ORDER — LIDOCAINE-EPINEPHRINE (PF) 2 %-1:200000 IJ SOLN
INTRAMUSCULAR | Status: AC
  Filled 2016-09-18: qty 20

## 2016-09-18 MED ORDER — FENTANYL CITRATE (PF) 100 MCG/2ML IJ SOLN
INTRAMUSCULAR | Status: DC | PRN
  Administered 2016-09-18 (×2): 50 ug via INTRAVENOUS
  Administered 2016-09-18: 100 ug via INTRAVENOUS

## 2016-09-18 MED ORDER — OXYCODONE-ACETAMINOPHEN 5-325 MG PO TABS: 1.0000 | ORAL_TABLET | ORAL | Status: DC | PRN

## 2016-09-18 MED ORDER — LIDOCAINE HCL (CARDIAC) 20 MG/ML IV SOLN
INTRAVENOUS | Status: DC | PRN
  Administered 2016-09-18: 70 mg via INTRATRACHEAL

## 2016-09-18 MED ORDER — ONDANSETRON HCL 4 MG PO TABS: 4.0000 mg | ORAL_TABLET | Freq: Four times a day (QID) | ORAL | Status: DC | PRN

## 2016-09-18 MED ORDER — ACETAMINOPHEN 325 MG PO TABS: 650.0000 mg | ORAL_TABLET | ORAL | Status: DC | PRN

## 2016-09-18 MED ORDER — LIDOCAINE-EPINEPHRINE (PF) 2 %-1:200000 IJ SOLN
INTRAMUSCULAR | Status: DC | PRN
  Administered 2016-09-18: 10 mL

## 2016-09-18 MED ORDER — FENTANYL CITRATE (PF) 100 MCG/2ML IJ SOLN
INTRAMUSCULAR | Status: AC
  Filled 2016-09-18: qty 2

## 2016-09-18 MED ORDER — ONDANSETRON HCL 4 MG/2ML IJ SOLN: 4.0000 mg | Freq: Once | INTRAMUSCULAR | Status: DC | PRN

## 2016-09-18 MED ORDER — METHYLPREDNISOLONE ACETATE 80 MG/ML IJ SUSP
INTRAMUSCULAR | Status: DC | PRN
  Administered 2016-09-18: 80 mg

## 2016-09-18 MED ORDER — HYDROCODONE-ACETAMINOPHEN 5-325 MG PO TABS: 1.0000 | ORAL_TABLET | ORAL | Status: DC | PRN

## 2016-09-18 MED ORDER — CEFAZOLIN SODIUM-DEXTROSE 2-4 GM/100ML-% IV SOLN
INTRAVENOUS | Status: AC
  Filled 2016-09-18: qty 100

## 2016-09-18 MED ORDER — METHYLPREDNISOLONE ACETATE 80 MG/ML IJ SUSP
INTRAMUSCULAR | Status: AC
  Filled 2016-09-18: qty 1

## 2016-09-18 MED ORDER — SODIUM CHLORIDE 0.9% FLUSH
3.0000 mL | Freq: Two times a day (BID) | INTRAVENOUS | Status: DC
  Administered 2016-09-18 (×2): 3 mL via INTRAVENOUS

## 2016-09-18 MED ORDER — HYDROXYZINE HCL 25 MG PO TABS: 50.0000 mg | ORAL_TABLET | ORAL | Status: DC | PRN

## 2016-09-18 MED ORDER — SODIUM CHLORIDE 0.9% FLUSH: 3.0000 mL | INTRAVENOUS | Status: DC | PRN

## 2016-09-18 MED ORDER — PHENOL 1.4 % MT LIQD: 1.0000 | OROMUCOSAL | Status: DC | PRN

## 2016-09-18 MED ORDER — IRBESARTAN 150 MG PO TABS
150.0000 mg | ORAL_TABLET | Freq: Every day | ORAL | Status: DC
  Administered 2016-09-18: 150 mg via ORAL
  Filled 2016-09-18 (×2): qty 1

## 2016-09-18 MED ORDER — HYDROXYZINE HCL 50 MG/ML IM SOLN: 50.0000 mg | INTRAMUSCULAR | Status: DC | PRN

## 2016-09-18 SURGICAL SUPPLY — 63 items
ADH SKN CLS APL DERMABOND .7 (GAUZE/BANDAGES/DRESSINGS) ×1
APL SKNCLS STERI-STRIP NONHPOA (GAUZE/BANDAGES/DRESSINGS)
BAG DECANTER FOR FLEXI CONT (MISCELLANEOUS) ×3 IMPLANT
BENZOIN TINCTURE PRP APPL 2/3 (GAUZE/BANDAGES/DRESSINGS) IMPLANT
BLADE CLIPPER SURG (BLADE) IMPLANT
BUR ACORN 6.0 ACORN (BURR) IMPLANT
BUR ACORN 6.0MM ACORN (BURR)
BUR ACRON 5.0MM COATED (BURR) IMPLANT
BUR MATCHSTICK NEURO 3.0 LAGG (BURR) ×3 IMPLANT
CANISTER SUCT 3000ML PPV (MISCELLANEOUS) ×3 IMPLANT
CARTRIDGE OIL MAESTRO DRILL (MISCELLANEOUS) ×1 IMPLANT
CLOSURE WOUND 1/2 X4 (GAUZE/BANDAGES/DRESSINGS)
DERMABOND ADVANCED (GAUZE/BANDAGES/DRESSINGS) ×2
DERMABOND ADVANCED .7 DNX12 (GAUZE/BANDAGES/DRESSINGS) IMPLANT
DIFFUSER DRILL AIR PNEUMATIC (MISCELLANEOUS) ×3 IMPLANT
DRAPE LAPAROTOMY 100X72X124 (DRAPES) ×3 IMPLANT
DRAPE MICROSCOPE LEICA (MISCELLANEOUS) ×3 IMPLANT
DRAPE POUCH INSTRU U-SHP 10X18 (DRAPES) ×3 IMPLANT
ELECT REM PT RETURN 9FT ADLT (ELECTROSURGICAL) ×3
ELECTRODE REM PT RTRN 9FT ADLT (ELECTROSURGICAL) ×1 IMPLANT
GAUZE SPONGE 4X4 12PLY STRL (GAUZE/BANDAGES/DRESSINGS) IMPLANT
GAUZE SPONGE 4X4 16PLY XRAY LF (GAUZE/BANDAGES/DRESSINGS) IMPLANT
GLOVE BIOGEL PI IND STRL 7.5 (GLOVE) IMPLANT
GLOVE BIOGEL PI IND STRL 8 (GLOVE) ×1 IMPLANT
GLOVE BIOGEL PI INDICATOR 7.5 (GLOVE) ×2
GLOVE BIOGEL PI INDICATOR 8 (GLOVE) ×2
GLOVE ECLIPSE 7.5 STRL STRAW (GLOVE) ×3 IMPLANT
GLOVE EXAM NITRILE LRG STRL (GLOVE) IMPLANT
GLOVE EXAM NITRILE XL STR (GLOVE) IMPLANT
GLOVE EXAM NITRILE XS STR PU (GLOVE) IMPLANT
GLOVE SURG SS PI 7.0 STRL IVOR (GLOVE) ×3 IMPLANT
GOWN STRL REUS W/ TWL LRG LVL3 (GOWN DISPOSABLE) ×1 IMPLANT
GOWN STRL REUS W/ TWL XL LVL3 (GOWN DISPOSABLE) IMPLANT
GOWN STRL REUS W/TWL 2XL LVL3 (GOWN DISPOSABLE) IMPLANT
GOWN STRL REUS W/TWL LRG LVL3 (GOWN DISPOSABLE) ×3
GOWN STRL REUS W/TWL XL LVL3 (GOWN DISPOSABLE)
KIT BASIN OR (CUSTOM PROCEDURE TRAY) ×3 IMPLANT
KIT ROOM TURNOVER OR (KITS) ×3 IMPLANT
NDL HYPO 18GX1.5 BLUNT FILL (NEEDLE) IMPLANT
NDL SPNL 18GX3.5 QUINCKE PK (NEEDLE) ×1 IMPLANT
NDL SPNL 22GX3.5 QUINCKE BK (NEEDLE) ×1 IMPLANT
NEEDLE HYPO 18GX1.5 BLUNT FILL (NEEDLE) ×3 IMPLANT
NEEDLE SPNL 18GX3.5 QUINCKE PK (NEEDLE) ×3 IMPLANT
NEEDLE SPNL 22GX3.5 QUINCKE BK (NEEDLE) ×3 IMPLANT
NS IRRIG 1000ML POUR BTL (IV SOLUTION) ×3 IMPLANT
OIL CARTRIDGE MAESTRO DRILL (MISCELLANEOUS) ×3
PACK LAMINECTOMY NEURO (CUSTOM PROCEDURE TRAY) ×3 IMPLANT
PAD ARMBOARD 7.5X6 YLW CONV (MISCELLANEOUS) ×9 IMPLANT
PATTIES SURGICAL .5 X1 (DISPOSABLE) ×3 IMPLANT
RUBBERBAND STERILE (MISCELLANEOUS) ×6 IMPLANT
SPONGE LAP 4X18 X RAY DECT (DISPOSABLE) IMPLANT
SPONGE SURGIFOAM ABS GEL SZ50 (HEMOSTASIS) ×3 IMPLANT
STRIP CLOSURE SKIN 1/2X4 (GAUZE/BANDAGES/DRESSINGS) IMPLANT
SUT PROLENE 6 0 BV (SUTURE) IMPLANT
SUT VIC AB 1 CT1 18XBRD ANBCTR (SUTURE) ×1 IMPLANT
SUT VIC AB 1 CT1 8-18 (SUTURE) ×3
SUT VIC AB 2-0 CP2 18 (SUTURE) ×3 IMPLANT
SUT VIC AB 3-0 SH 8-18 (SUTURE) IMPLANT
SYR 3ML LL SCALE MARK (SYRINGE) ×2 IMPLANT
SYR 5ML LL (SYRINGE) IMPLANT
TOWEL OR 17X24 6PK STRL BLUE (TOWEL DISPOSABLE) ×3 IMPLANT
TOWEL OR 17X26 10 PK STRL BLUE (TOWEL DISPOSABLE) ×3 IMPLANT
WATER STERILE IRR 1000ML POUR (IV SOLUTION) ×3 IMPLANT

## 2016-09-18 NOTE — H&P (Signed)
Subjective: Patient is a 83 y.o. right-handed white male who is admitted for treatment of left lumbar radiculopathy, with weakness of the left dorsiflexor and EHL, secondary to a left L3-4 lumbar disc patient, with a fragment is migrated caudally behind the body of L4, superimposed upon severe multifactorial lumbar stenosis at the L3-4 level.  Symptoms began to half months ago without any particular inciting cause. He developed pain in the lateral aspect the left leg. Extent of the left ankle into the dorsum the left foot as well as the lateral left thigh and left buttock. He has some soreness in the low back, but the radicular pain to the left lower extremity is much more severe. He has a sense of some weakness in the left foot as well as some paresthesias, but no numbness. Patient was evaluated with a lumbar myelogram and post myelogram CT scan (due to having a pacemaker) which revealed severe stenosis at the L3-4 level with a left L3-4 disc herniation, with free fragment has migrated caudally behind the body of L4. He also has bilateral L5 pars interarticularis defects with a grade 2 spondylolisthesis at the L5-S1 level. He is admitted now for a left L3-4 lumbar laminotomy and microdiscectomy.   Patient Active Problem List   Diagnosis Date Noted  . Pacemaker 05/10/2015  . Left ventricular dysfunction 05/10/2015  . Coronary artery disease involving native coronary artery of native heart with angina pectoris with documented spasm ([place])   . Angina pectoris, crescendo ([place]) 05/05/2015  . Unstable angina ([place])   . Moderate mitral insufficiency   . Essential hypertension, benign   . History of GI diverticular bleed   . Atrial tachycardia ([place]) 03/01/2015  . RBBB   . Coronary artery disease involving native coronary artery of native heart without angina pectoris   . Complete heart block ([place]) 11/21/2014  . Essential hypertension 11/09/2014  . Old MI (myocardial infarction) 11/09/2014  .  Hyperlipidemia 11/09/2014  . Coronary artery disease due to lipid rich plaque 11/09/2014  . Lower gastrointestinal bleeding 03/07/2014  . Orthostatic hypotension 03/07/2014  . GI bleed 03/07/2014  . Bradycardia 03/07/2014  . Diarrhea 03/07/2014  . Pure hypercholesterolemia 11/23/2013  . Insomnia 11/23/2013  . CAD (coronary artery disease) 05/31/2013  . Lower GI bleed 05/31/2013  . UPPER RESPIRATORY INFECTION, ACUTE, WITH BRONCHITIS 07/30/2010  . FOOT PAIN, CHRONIC 02/19/2008  . CARCINOMA, PROSTATE 11/13/2007  . HYPERTENSION 11/13/2007  . Allergic rhinitis due to allergen 11/13/2007  . ESOPHAGEAL REFLUX 11/13/2007  . COUGH 11/13/2007  . METATARSALGIA 02/24/2007  . Abnormality of gait 02/24/2007   Past Medical History:  Diagnosis Date  . Allergic rhinitis   . Arthritis    "thumbs primarily" (05/05/2015)  . Basal cell carcinoma, face    "have had several; cut and burned off"  . CAD (coronary artery disease)    a. 2001 s/p post MI- multiple PCI's to LCX/OM;  b. 2003 DES to OM;  c. 05/2012 MV: postlat infarct w/ mild peri-infarct ischemia. nl EF->Med Rx.  . Complete heart block ([place])    St. Jude pacemaker implanted 11/22/14  . ED (erectile dysfunction)   . Esophageal reflux    "slight"  . Essential hypertension, benign   . Heart murmur   . History of atrial fibrillation   . History of GI diverticular bleed    a. 05/2013 and 02/2014 - taken off of ASA for this reason.  . Metatarsalgia   . MI (myocardial infarction) 2001  . Moderate mitral insufficiency  a. 10/2014 Echo: EF 55-60%, Gr1 DD, mild AS, mod MR, sev dil LA, nl RV, mildly dil RA, mild TR, PASP 42mmHg.  . Pain, foot, chronic   . Pes planus   . Presence of permanent cardiac pacemaker   . 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 Prostate cancer ([place]) 2008   S/P "radiation and seed implants"  . Pure hypercholesterolemia   . RLS (restless legs syndrome)     Past Surgical History:  Procedure Laterality  Date  . APPENDECTOMY  1956  . BASAL CELL CARCINOMA EXCISION     "face"  . CARDIAC CATHETERIZATION N/A 05/05/2015   Procedure: Left Heart Cath and Coronary Angiography;  Surgeon: Belva Crome, MD;  Location: [facility];  Service: Cardiovascular;  Laterality: N/A;  . CARDIAC CATHETERIZATION N/A 05/05/2015   Procedure: Coronary Stent Intervention;  Surgeon: Belva Crome, MD;  Location: [place] [facility];  Service: Cardiovascular;  Laterality: N/A;  . CATARACT EXTRACTION W/ INTRAOCULAR LENS IMPLANT Left 12/2014  . COLONOSCOPY W/ POLYPECTOMY    . CORONARY ANGIOPLASTY    . CORONARY ANGIOPLASTY WITH STENT PLACEMENT  04/2000; ? ~ 2002; 01/2002  . ESTERNAL BEAM XRT AND SEEDS FOR PROSTATE CANCER    . EYE SURGERY Right 07/2016  . FOOT ARTHRODESIS, TRIPLE Right 2007  . GANGLION CYST EXCISION Right 1970's   wrist  . INSERT / REPLACE / REMOVE PACEMAKER  2017  . PERMANENT PACEMAKER INSERTION N/A 11/22/2014   Procedure: PERMANENT PACEMAKER INSERTION;  Surgeon: [name], MD;  Location: [facility];  Service: Cardiovascular;  Laterality: N/A;  . PROSTATE BIOPSY    . TONSILLECTOMY  1942    Prescriptions Prior to Admission  Medication Sig Dispense Refill Last Dose  . aspirin EC 81 MG tablet Take 1 tablet (81 mg total) by mouth daily.   Past Month at Unknown time  . atorvastatin (LIPITOR) 40 MG tablet TAKE 1 TABLET (40 MG TOTAL) BY MOUTH DAILY. (Patient taking differently: TAKE 1 TABLET (40 MG TOTAL) BY MOUTH DAILY AT LUNCH.) 90 tablet 2 09/17/2016 at Unknown time  . carvedilol (COREG) 12.5 MG tablet Take 0.5 tablets (6.25 mg total) by mouth 2 (two) times daily with a meal. (Patient taking differently: Take 12.5 mg by mouth 2 (two) times daily with a meal. ) 30 tablet 9 09/18/2016 at Unknown time  . cetirizine (ZYRTEC) 10 MG tablet Take 10 mg by mouth daily as needed for allergies.    09/18/2016 at Unknown time  . Cholecalciferol (VITAMIN D3) 2000 units TABS Take 2,000 Units by mouth every evening.     Past Week at Unknown time  . clopidogrel (PLAVIX) 75 MG tablet Take 1 tablet (75 mg total) by mouth daily. 90 tablet 3 Past Month at Unknown time  . Coenzyme Q-10 100 MG capsule Take 100 mg by mouth every evening.    Past Month at Unknown time  . famotidine (PEPCID) 10 MG tablet Take 10 mg by mouth daily as needed for heartburn or indigestion.   Past Month at Unknown time  . Multiple Vitamin (MULTIVITAMIN) tablet Take 1 tablet by mouth 3 (three) times a week.    Past Month at Unknown time  . nitroGLYCERIN (NITROSTAT) 0.4 MG SL tablet Place 1 tablet (0.4 mg total) under the tongue every 5 (five) minutes as needed for chest pain (MAX 3 TABLETS). 25 tablet 3 Taking  . Omega-3 Fatty Acids (FISH OIL) 1200 MG CAPS Take 1,200 mg by mouth 2 (  two) times daily.   Past Month at Unknown time  . oxyCODONE-acetaminophen (PERCOCET/ROXICET) 5-325 MG tablet Take 1 tablet by mouth at bedtime.   Past Week at Unknown time  . Resveratrol 250 MG CAPS Take 250 mg by mouth every evening.    Past Week at Unknown time  . tetrahydrozoline 0.05 % ophthalmic solution Place 1 drop into both eyes 3 (three) times daily as needed (for dry/irritated eyes).   Past Month at Unknown time  . valsartan (DIOVAN) 160 MG tablet Take 160 mg by mouth 2 (two) times daily.   Past Month at Unknown time  . sildenafil (VIAGRA) 100 MG tablet Take 100 mg by mouth daily as needed for erectile dysfunction.    More than a month at Unknown time   Allergies  Allergen Reactions  . Ambien [Zolpidem] Other (See Comments)    Sleep walk   . Erythromycin Nausea And Vomiting and Other (See Comments)    GI upset and pains    Social History  Substance Use Topics  . Smoking status: Former Smoker    Packs/day: 0.50    Years: 50.00    Types: Cigarettes    Quit date: 05/22/2000  . Smokeless tobacco: Never Used  . Alcohol use Yes     Comment: 05/05/2015 "3-6 glasses of wine/day"    Family History  Problem Relation Age of Onset  . Breast cancer Mother    . Heart disease Father      Review of Systems A comprehensive review of systems was negative.  Objective: Vital signs in last 24 hours: Temp:  [97.6 F (36.4 C)] 97.6 F (36.4 C) (01/24 0722) Pulse Rate:  [59] 59 (01/24 0722) Resp:  [18] 18 (01/24 0722) BP: (116-121)/(38-43) 116/43 (01/24 0741) SpO2:  [98 %] 98 % (01/24 0722) Weight:  [78.9 kg (174 lb)] 78.9 kg (174 lb) (01/24 0733)  EXAM: Patient well-developed well-nourished white male in no acute distress. Lungs are clear to auscultation , the patient has symmetrical respiratory excursion. Heart has a regular rate and rhythm normal S1 and S2 no murmur.   Abdomen is soft nontender nondistended bowel sounds are present. Extremity examination shows no clubbing cyanosis or edema. However the right foot and ankle show changes consistent with his history of fusion. Straight leg raising is positive the left, negative on the right. Motor examination shows 5 over 5 strength in the the iliopsoas and quadriceps bilaterally, as well as the right dorsiflexor, extensor hallicus  longus, and plantar flexor.  However the left dorsal flexors 4+, the left extensor hallicus longus is 3, and the left plantar flexors 5.  Sensation is intact to pinprick in the distal lower extremities. Reflexes are symmetrical bilaterally. No pathologic reflexes are present. Patient's gait and stance favor the left lower extremity.    Data Review:CBC    Component Value Date/Time   WBC 9.6 09/10/2016 0947   RBC 3.84 (L) 09/10/2016 0947   HGB 11.9 (L) 09/10/2016 0947   HCT 34.9 (L) 09/10/2016 0947   PLT 196 09/10/2016 0947   MCV 90.9 09/10/2016 0947   MCH 31.0 09/10/2016 0947   MCHC 34.1 09/10/2016 0947   RDW 14.6 09/10/2016 0947   LYMPHSABS 2.2 05/03/2015 1001   MONOABS 0.8 05/03/2015 1001   EOSABS 0.4 05/03/2015 1001   BASOSABS 0.1 05/03/2015 1001                          BMET    Component Value  Date/Time   NA 138 09/10/2016 0947   K 4.4 09/10/2016 0947    CL 107 09/10/2016 0947   CO2 24 09/10/2016 0947   GLUCOSE 127 (H) 09/10/2016 0947   BUN 21 (H) 09/10/2016 0947   CREATININE 0.82 09/10/2016 0947   CALCIUM 9.0 09/10/2016 0947   GFRNONAA >60 09/10/2016 0947   GFRAA >60 09/10/2016 0947     Assessment/Plan: Patient with disabling left lumbar radiculopathy secondary to a left L3-4 lumbar disc herniation, with a free fragment has migrated caudally behind the body of L4, superimposed upon underlying severe multifactorial lumbar stenosis at L3-4, is admitted now for left L3-4 lumbar laminotomy and microdiscectomy.  I've discussed with the patient the nature of his condition, the nature the surgical procedure, the typical length of surgery, hospital stay, and overall recuperation. We discussed limitations postoperatively. I discussed risks of surgery including risks of infection, bleeding, possibly need for transfusion, the risk of nerve root dysfunction with pain, weakness, numbness, or paresthesias, or risk of dural tear and CSF leakage and possible need for further surgery, the risk of recurrent disc herniation and the possible need for further surgery, and the risk of anesthetic complications including myocardial infarction, stroke, pneumonia, and death. Understanding all this the patient does wish to proceed with surgery and is admitted for such.    Hosie Spangle, MD 09/18/2016 9:42 AM

## 2016-09-18 NOTE — Transfer of Care (Signed)
Immediate Anesthesia Transfer of Care Note  Patient: Andrew Marshall  Procedure(s) Performed: Procedure(s) with comments: LEFT LUMBAR THREE- LUMBAR FOUR LUMBAR LAMINOTOMY AND MICRODISCECTOMY (Left) - LEFT LUMBAR 3- LUMBAR 4 LUMBAR LAMINOTOMY AND MICRODISCECTOMY  Patient Location: PACU  Anesthesia Type:General  Level of Consciousness: awake, alert  and oriented  Airway & Oxygen Therapy: Patient Spontanous Breathing and Patient connected to nasal cannula oxygen  Post-op Assessment: Report given to RN, Post -op Vital signs reviewed and stable and Patient moving all extremities X 4  Post vital signs: Reviewed and stable  Last Vitals:  Vitals:   09/18/16 0741 09/18/16 1143  BP: (!) 116/43 (!) (P) 150/55  Pulse:  (P) 60  Resp:  (P) 12  Temp:  (P) 36.4 C    Last Pain:  Vitals:   09/18/16 0722  TempSrc: Oral         Complications: No apparent anesthesia complications

## 2016-09-18 NOTE — Progress Notes (Signed)
Vitals:   09/18/16 1230 09/18/16 1245 09/18/16 1314 09/18/16 1624  BP: 139/65 (!) 149/61 (!) 151/71 (!) 132/54  Pulse: 60 60 63 63  Resp: 10 10 16 16   Temp:  97.3 F (36.3 C)  97.5 F (36.4 C)  TempSrc:      SpO2: 99% 100% 99% 100%  Weight:      Height:        Patient up and active, ambulating in the halls several times. Wound clean and dry; no swelling, erythema, or drainage. Excellent relief of radicular pain.  Plan: Doing well following surgery today. Encouraged to ambulate. Continue to progress through postoperative recovery.  Hosie Spangle, MD 09/18/2016, 7:09 PM

## 2016-09-18 NOTE — Op Note (Signed)
09/18/2016  11:31 AM  PATIENT:  Andrew Marshall  83 y.o. male  PRE-OPERATIVE DIAGNOSIS:  Left L3-4 lumbar disc herniation with a free fragment migrated caudally behind the left side of the body of L4, L3-4 multifactorial lumbar stenosis, lumbar spondylosis, lumbar degenerative disc disease, left lumbar radiculopathy with left dorsiflexor and extensor hallicus longus weakness  POST-OPERATIVE DIAGNOSIS:  Left L3-4 lumbar disc herniation with a free fragment migrated caudally behind the left side of the body of L4, L3-4 multifactorial lumbar stenosis, lumbar spondylosis, lumbar degenerative disc disease, left lumbar radiculopathy with left dorsiflexor and extensor hallicus longus weakness  PROCEDURE:  Procedure(s):  LEFT LUMBAR THREE- LUMBAR FOUR LUMBAR LAMINOTOMY AND MICRODISCECTOMY  SURGEON:  Surgeon(s): Jovita Gamma, MD  ASSISTANTS: Newman Pies, M.D.  ANESTHESIA:   general  EBL:  Total I/O In: 1000 [I.V.:1000] Out: -   EBL: 20 mL  BLOOD ADMINISTERED:none  COUNT:  Correct per nursing staff  DICTATION: Patient was brought to the operating room and placed under general endotracheal anesthesia. Patient was turned to prone position the lumbar region was prepped with Betadine soap and solution and draped in a sterile fashion. The midline was infiltrated with local anesthetic with epinephrine. A localizing x-ray was taken and the L3-4 level was identified. Midline incision was made over the L3-4 level and was carried down through the subcutaneous tissue to the lumbar fascia. The lumbar fascia was incised on the left side and the paraspinal muscles were dissected from the spinous processes and lamina in a subperiosteal fashion. Another x-ray was taken and the L3-4 intralaminar space was identified. The operating microscope was draped and brought into the field provided additional magnification, illumination, and visualization. Laminotomy was performed using the high-speed drill and Kerrison  punches. The ligamentum flavum was carefully resected. The underlying thecal sac and exiting left L4 nerve root were identified. The disc herniation was identified caudal to the L3-4 disc space level, behind the left side of the body of L4, compressing the exiting left L4 nerve root and thecal sac.  The thecal sac and nerve root gently retracted medially. Using a variety of micro-looks, we mobilized the free fragment, and it was removed in a piecemeal fashion. The ventral epidural space was thoroughly explored, and all loose fracture the disc material were removed. Good decompression of the thecal sac and exiting left L4 nerve root was achieved. Hemostasis was established with the use of bipolar cautery and Gelfoam with thrombin. The Gelfoam was removed and hemostasis confirmed. We then instilled 2 cc of fentanyl and 80 mg of Depo-Medrol into the epidural space. Deep fascia was closed with interrupted undyed 1 Vicryl sutures. Scarpa's fascia was closed with interrupted undyed 1 Vicryl sutures in the subcutaneous and subcuticular layer were closed with interrupted inverted 2-0 undyed Vicryl sutures. The skin edges were approximated with Dermabond. Following surgery the patient was turned back to a supine position to be reversed from the anesthetic extubated and transferred to the recovery room for further care.  PLAN OF CARE: Admit for overnight observation  PATIENT DISPOSITION:  PACU - hemodynamically stable.   Delay start of Pharmacological VTE agent (>24hrs) due to surgical blood loss or risk of bleeding:  yes

## 2016-09-18 NOTE — Anesthesia Procedure Notes (Signed)
Procedure Name: Intubation Date/Time: 09/18/2016 9:57 AM Performed by: Mariea Clonts Pre-anesthesia Checklist: Patient identified, Emergency Drugs available, Suction available and Patient being monitored Patient Re-evaluated:Patient Re-evaluated prior to inductionOxygen Delivery Method: Circle System Utilized Preoxygenation: Pre-oxygenation with 100% oxygen Intubation Type: IV induction Ventilation: Mask ventilation without difficulty Laryngoscope Size: Miller and 2 Grade View: Grade II Tube type: Oral Tube size: 7.5 mm Number of attempts: 1 Airway Equipment and Method: Stylet and Oral airway Placement Confirmation: ETT inserted through vocal cords under direct vision,  positive ETCO2 and breath sounds checked- equal and bilateral Tube secured with: Tape Dental Injury: Teeth and Oropharynx as per pre-operative assessment

## 2016-09-18 NOTE — Anesthesia Postprocedure Evaluation (Signed)
Anesthesia Post Note  Patient: [name]  Procedure(s) Performed: Procedure(s) (LRB): LEFT LUMBAR THREE- LUMBAR FOUR LUMBAR LAMINOTOMY AND MICRODISCECTOMY (Left)  Patient location during evaluation: PACU Anesthesia Type: General Level of consciousness: awake and alert Pain management: pain level controlled Vital Signs Assessment: post-procedure vital signs reviewed and stable Respiratory status: spontaneous breathing, nonlabored ventilation, respiratory function stable and patient connected to nasal cannula oxygen Cardiovascular status: blood pressure returned to baseline and stable Postop Assessment: no signs of nausea or vomiting Anesthetic complications: no       Last Vitals:  Vitals:   09/18/16 1245 09/18/16 1314  BP: (!) 149/61 (!) 151/71  Pulse: 60 63  Resp: 10 16  Temp: 36.3 C     Last Pain:  Vitals:   09/18/16 1245  TempSrc:   PainSc: 3                  Catalina Gravel

## 2016-09-19 ENCOUNTER — Encounter (HOSPITAL_COMMUNITY): Payer: Self-pay | Admitting: Neurosurgery

## 2016-09-19 DIAGNOSIS — M48061 Spinal stenosis, lumbar region without neurogenic claudication: Secondary | ICD-10-CM | POA: Diagnosis not present

## 2016-09-19 DIAGNOSIS — Z950 Presence of cardiac pacemaker: Secondary | ICD-10-CM | POA: Diagnosis not present

## 2016-09-19 DIAGNOSIS — M2408 Loose body, other site: Secondary | ICD-10-CM | POA: Diagnosis not present

## 2016-09-19 DIAGNOSIS — M5116 Intervertebral disc disorders with radiculopathy, lumbar region: Secondary | ICD-10-CM | POA: Diagnosis not present

## 2016-09-19 DIAGNOSIS — M47896 Other spondylosis, lumbar region: Secondary | ICD-10-CM | POA: Diagnosis not present

## 2016-09-19 DIAGNOSIS — I25110 Atherosclerotic heart disease of native coronary artery with unstable angina pectoris: Secondary | ICD-10-CM | POA: Diagnosis not present

## 2016-09-19 NOTE — Progress Notes (Signed)
Pt doing well. Pt and wife given D/C instructions with Rx's, verbal understanding was provided. Pt's incision is open to air and is clean and dry. Pt's IV was removed prior to D/C. Pt D/C'd home via walking @ 1125 per MD order. Pt is stable @ D/C and has no other needs at this time. Holli Humbles, RN

## 2016-09-19 NOTE — Discharge Instructions (Signed)

## 2016-09-19 NOTE — Discharge Summary (Signed)
Physician Discharge Summary  Patient ID: LINUX EICHER MRN: [id number] DOB/AGE: 03/12/1931 83 y.o.  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 with a free fragment migrated caudally behind the left side of the body of L4, L3-4 multifactorial lumbar stenosis, lumbar spondylosis, lumbar degenerative disc disease, left lumbar radiculopathy with left dorsiflexor and extensor hallicus longus weakness  Discharge Diagnoses:  Left L3-4 lumbar disc herniation with a free fragment migrated caudally behind the left side of the body of L4, L3-4 multifactorial lumbar stenosis, lumbar spondylosis, lumbar degenerative disc disease, left lumbar radiculopathy with left dorsiflexor and extensor hallicus longus weakness Active Problems:   HNP (herniated nucleus pulposus), lumbar   Discharged Condition: good  Hospital Course: Patient admitted, underwent a left L3-4 lumbar laminotomy and microdiscectomy. Postoperatively he has done very well. He is up and ambulating actively. His incision is healing nicely; there is no swelling, erythema, or drainage. He is asking to be discharged to home, and has been given instructions regarding wound care and activities following discharge. He is scheduled to follow-up with me in 3 weeks. We offered a prescription for pain medication, but the patient declined, and plans on using over-the-counter NSAIDs, Tylenol, and some pain medication he still has in his home.  I've recommended that he continue to hold his Plavix for 1 week, but at that point if he is doing well and his wound is doing well he can resume the Plavix. However he explains that he scheduled to see Dr. Wynonia Lawman in about a week for a echocardiogram, and is going to ask Dr. Wynonia Lawman whether or not he wants him to resume the Plavix, or whether the patient can forego it.  Discharge Exam: Blood pressure (!) 137/59, pulse 60, temperature 98 F (36.7 C), temperature  source Oral, resp. rate 18, height 5' 9.5" (1.765 m), weight 78.9 kg (174 lb), SpO2 100 %.  Disposition: 01-Home or Self Care  Discharge Instructions    Discharge wound care:    Complete by:  As directed    Leave the wound open to air. Shower daily with the wound uncovered. Water and soapy water should run over the incision area. Do not wash directly on the incision for 2 weeks. Remove the glue after 2 weeks.   Driving Restrictions    Complete by:  As directed    No driving for 2 weeks. May ride in the car locally now. May begin to drive locally in 2 weeks.   Other Restrictions    Complete by:  As directed    Walk gradually increasing distances out in the fresh air at least twice a day. Walking additional 6 times inside the house, gradually increasing distances, daily. No bending, lifting, or twisting. Perform activities between shoulder and waist height (that is at counter height when standing or table height when sitting).     Allergies as of 09/19/2016      Reactions   Ambien [zolpidem] Other (See Comments)   Sleep walk   Erythromycin Nausea And Vomiting, Other (See Comments)   GI upset and pains      Medication List    TAKE these medications   aspirin EC 81 MG tablet Take 1 tablet (81 mg total) by mouth daily.   atorvastatin 40 MG tablet Commonly known as:  LIPITOR TAKE 1 TABLET (40 MG TOTAL) BY MOUTH DAILY. What changed:  See the new instructions.   carvedilol 12.5 MG tablet Commonly known as:  COREG Take  0.5 tablets (6.25 mg total) by mouth 2 (two) times daily with a meal. What changed:  how much to take   cetirizine 10 MG tablet Commonly known as:  ZYRTEC Take 10 mg by mouth daily as needed for allergies.   clopidogrel 75 MG tablet Commonly known as:  PLAVIX Take 1 tablet (75 mg total) by mouth daily.   Coenzyme Q-10 100 MG capsule Take 100 mg by mouth every evening.   famotidine 10 MG tablet Commonly known as:  PEPCID Take 10 mg by mouth daily as needed  for heartburn or indigestion.   Fish Oil 1200 MG Caps Take 1,200 mg by mouth 2 (two) times daily.   multivitamin tablet Take 1 tablet by mouth 3 (three) times a week.   nitroGLYCERIN 0.4 MG SL tablet Commonly known as:  NITROSTAT Place 1 tablet (0.4 mg total) under the tongue every 5 (five) minutes as needed for chest pain (MAX 3 TABLETS).   oxyCODONE-acetaminophen 5-325 MG tablet Commonly known as:  PERCOCET/ROXICET Take 1 tablet by mouth at bedtime.   Resveratrol 250 MG Caps Take 250 mg by mouth every evening.   sildenafil 100 MG tablet Commonly known as:  VIAGRA Take 100 mg by mouth daily as needed for erectile dysfunction.   tetrahydrozoline 0.05 % ophthalmic solution Place 1 drop into both eyes 3 (three) times daily as needed (for dry/irritated eyes).   valsartan 160 MG tablet Commonly known as:  DIOVAN Take 160 mg by mouth 2 (two) times daily.   Vitamin D3 2000 units Tabs Take 2,000 Units by mouth every evening.        SignedHosie Spangle 09/19/2016, 10:11 AM

## 2016-10-07 DIAGNOSIS — I2510 Atherosclerotic heart disease of native coronary artery without angina pectoris: Secondary | ICD-10-CM | POA: Diagnosis not present

## 2016-10-14 DIAGNOSIS — Z1389 Encounter for screening for other disorder: Secondary | ICD-10-CM | POA: Diagnosis not present

## 2016-10-14 DIAGNOSIS — Z0000 Encounter for general adult medical examination without abnormal findings: Secondary | ICD-10-CM | POA: Diagnosis not present

## 2016-10-17 DIAGNOSIS — H02834 Dermatochalasis of left upper eyelid: Secondary | ICD-10-CM | POA: Diagnosis not present

## 2016-10-17 DIAGNOSIS — H02423 Myogenic ptosis of bilateral eyelids: Secondary | ICD-10-CM | POA: Diagnosis not present

## 2016-10-17 DIAGNOSIS — H02413 Mechanical ptosis of bilateral eyelids: Secondary | ICD-10-CM | POA: Diagnosis not present

## 2016-10-17 DIAGNOSIS — H02132 Senile ectropion of right lower eyelid: Secondary | ICD-10-CM | POA: Diagnosis not present

## 2016-10-17 DIAGNOSIS — H53483 Generalized contraction of visual field, bilateral: Secondary | ICD-10-CM | POA: Diagnosis not present

## 2016-10-17 DIAGNOSIS — Z09 Encounter for follow-up examination after completed treatment for conditions other than malignant neoplasm: Secondary | ICD-10-CM | POA: Diagnosis not present

## 2016-10-17 DIAGNOSIS — H0279 Other degenerative disorders of eyelid and periocular area: Secondary | ICD-10-CM | POA: Diagnosis not present

## 2016-10-17 DIAGNOSIS — H02135 Senile ectropion of left lower eyelid: Secondary | ICD-10-CM | POA: Diagnosis not present

## 2016-10-17 DIAGNOSIS — H02831 Dermatochalasis of right upper eyelid: Secondary | ICD-10-CM | POA: Diagnosis not present

## 2016-11-04 ENCOUNTER — Ambulatory Visit (INDEPENDENT_AMBULATORY_CARE_PROVIDER_SITE_OTHER): Payer: Medicare Other | Admitting: *Deleted

## 2016-11-04 DIAGNOSIS — I495 Sick sinus syndrome: Secondary | ICD-10-CM

## 2016-11-05 NOTE — Progress Notes (Signed)
Remote pacemaker transmission.   

## 2016-11-06 ENCOUNTER — Encounter: Payer: Self-pay | Admitting: Cardiology

## 2016-11-06 LAB — CUP PACEART REMOTE DEVICE CHECK
Battery Remaining Longevity: 115 mo
Battery Remaining Percentage: 95.5 %
Battery Voltage: 2.99 V
Brady Statistic AP VP Percent: 90 %
Brady Statistic AP VS Percent: 1 %
Brady Statistic AS VP Percent: 7.9 %
Brady Statistic AS VS Percent: 1 %
Brady Statistic RA Percent Paced: 88 %
Brady Statistic RV Percent Paced: 98 %
Date Time Interrogation Session: 20180312071435
Implantable Lead Implant Date: 20160329
Implantable Lead Implant Date: 20160329
Implantable Lead Location: 753859
Implantable Lead Location: 753860
Implantable Lead Model: 1948
Implantable Pulse Generator Implant Date: 20160329
Lead Channel Impedance Value: 390 Ohm
Lead Channel Impedance Value: 660 Ohm
Lead Channel Pacing Threshold Amplitude: 0.5 V
Lead Channel Pacing Threshold Amplitude: 0.75 V
Lead Channel Pacing Threshold Pulse Width: 0.4 ms
Lead Channel Pacing Threshold Pulse Width: 0.4 ms
Lead Channel Sensing Intrinsic Amplitude: 12 mV
Lead Channel Sensing Intrinsic Amplitude: 3.4 mV
Lead Channel Setting Pacing Amplitude: 1 V
Lead Channel Setting Pacing Amplitude: 2.5 V
Lead Channel Setting Pacing Pulse Width: 0.4 ms
Lead Channel Setting Sensing Sensitivity: 2.5 mV
Pulse Gen Model: 2240
Pulse Gen Serial Number: 7732903

## 2016-12-30 DIAGNOSIS — Q762 Congenital spondylolisthesis: Secondary | ICD-10-CM | POA: Diagnosis not present

## 2016-12-30 DIAGNOSIS — M4726 Other spondylosis with radiculopathy, lumbar region: Secondary | ICD-10-CM | POA: Diagnosis not present

## 2016-12-30 DIAGNOSIS — M5136 Other intervertebral disc degeneration, lumbar region: Secondary | ICD-10-CM | POA: Diagnosis not present

## 2016-12-30 DIAGNOSIS — Z9889 Other specified postprocedural states: Secondary | ICD-10-CM | POA: Diagnosis not present

## 2017-02-03 ENCOUNTER — Ambulatory Visit (INDEPENDENT_AMBULATORY_CARE_PROVIDER_SITE_OTHER): Payer: Medicare Other | Admitting: *Deleted

## 2017-02-03 DIAGNOSIS — I495 Sick sinus syndrome: Secondary | ICD-10-CM

## 2017-02-03 NOTE — Progress Notes (Signed)
Remote pacemaker transmission.   

## 2017-02-04 DIAGNOSIS — N5201 Erectile dysfunction due to arterial insufficiency: Secondary | ICD-10-CM | POA: Diagnosis not present

## 2017-02-04 DIAGNOSIS — R361 Hematospermia: Secondary | ICD-10-CM | POA: Diagnosis not present

## 2017-02-05 ENCOUNTER — Encounter: Payer: Self-pay | Admitting: Cardiology

## 2017-02-05 LAB — CUP PACEART REMOTE DEVICE CHECK
Battery Remaining Longevity: 115 mo
Battery Remaining Percentage: 95.5 %
Battery Voltage: 2.99 V
Brady Statistic AP VP Percent: 90 %
Brady Statistic AP VS Percent: 1 %
Brady Statistic AS VP Percent: 7.1 %
Brady Statistic AS VS Percent: 1 %
Brady Statistic RA Percent Paced: 88 %
Brady Statistic RV Percent Paced: 97 %
Date Time Interrogation Session: 20180611093545
Implantable Lead Implant Date: 20160329
Implantable Lead Implant Date: 20160329
Implantable Lead Location: 753859
Implantable Lead Location: 753860
Implantable Lead Model: 1948
Implantable Pulse Generator Implant Date: 20160329
Lead Channel Impedance Value: 390 Ohm
Lead Channel Impedance Value: 660 Ohm
Lead Channel Pacing Threshold Amplitude: 0.5 V
Lead Channel Pacing Threshold Amplitude: 0.875 V
Lead Channel Pacing Threshold Pulse Width: 0.4 ms
Lead Channel Pacing Threshold Pulse Width: 0.4 ms
Lead Channel Sensing Intrinsic Amplitude: 12 mV
Lead Channel Sensing Intrinsic Amplitude: 2.9 mV
Lead Channel Setting Pacing Amplitude: 1.125
Lead Channel Setting Pacing Amplitude: 2.5 V
Lead Channel Setting Pacing Pulse Width: 0.4 ms
Lead Channel Setting Sensing Sensitivity: 2.5 mV
Pulse Gen Model: 2240
Pulse Gen Serial Number: 7732903

## 2017-02-11 DIAGNOSIS — I2510 Atherosclerotic heart disease of native coronary artery without angina pectoris: Secondary | ICD-10-CM | POA: Diagnosis not present

## 2017-02-11 DIAGNOSIS — Z950 Presence of cardiac pacemaker: Secondary | ICD-10-CM | POA: Diagnosis not present

## 2017-02-11 DIAGNOSIS — I209 Angina pectoris, unspecified: Secondary | ICD-10-CM | POA: Diagnosis not present

## 2017-02-11 DIAGNOSIS — Z955 Presence of coronary angioplasty implant and graft: Secondary | ICD-10-CM | POA: Diagnosis not present

## 2017-02-11 DIAGNOSIS — E785 Hyperlipidemia, unspecified: Secondary | ICD-10-CM | POA: Diagnosis not present

## 2017-02-11 DIAGNOSIS — I252 Old myocardial infarction: Secondary | ICD-10-CM | POA: Diagnosis not present

## 2017-02-11 DIAGNOSIS — I119 Hypertensive heart disease without heart failure: Secondary | ICD-10-CM | POA: Diagnosis not present

## 2017-02-13 ENCOUNTER — Encounter: Payer: Self-pay | Admitting: Internal Medicine

## 2017-02-27 DIAGNOSIS — D1801 Hemangioma of skin and subcutaneous tissue: Secondary | ICD-10-CM | POA: Diagnosis not present

## 2017-02-27 DIAGNOSIS — D225 Melanocytic nevi of trunk: Secondary | ICD-10-CM | POA: Diagnosis not present

## 2017-02-27 DIAGNOSIS — L821 Other seborrheic keratosis: Secondary | ICD-10-CM | POA: Diagnosis not present

## 2017-02-27 DIAGNOSIS — Z85828 Personal history of other malignant neoplasm of skin: Secondary | ICD-10-CM | POA: Diagnosis not present

## 2017-02-27 DIAGNOSIS — L720 Epidermal cyst: Secondary | ICD-10-CM | POA: Diagnosis not present

## 2017-02-27 DIAGNOSIS — L570 Actinic keratosis: Secondary | ICD-10-CM | POA: Diagnosis not present

## 2017-02-27 DIAGNOSIS — D692 Other nonthrombocytopenic purpura: Secondary | ICD-10-CM | POA: Diagnosis not present

## 2017-03-03 ENCOUNTER — Encounter: Payer: Self-pay | Admitting: Internal Medicine

## 2017-03-03 ENCOUNTER — Ambulatory Visit (INDEPENDENT_AMBULATORY_CARE_PROVIDER_SITE_OTHER): Payer: Medicare Other | Admitting: Internal Medicine

## 2017-03-03 VITALS — BP 116/70 | HR 62 | Ht 69.5 in | Wt 175.2 lb

## 2017-03-03 DIAGNOSIS — I495 Sick sinus syndrome: Secondary | ICD-10-CM

## 2017-03-03 DIAGNOSIS — Z950 Presence of cardiac pacemaker: Secondary | ICD-10-CM

## 2017-03-03 DIAGNOSIS — I442 Atrioventricular block, complete: Secondary | ICD-10-CM | POA: Diagnosis not present

## 2017-03-03 DIAGNOSIS — I480 Paroxysmal atrial fibrillation: Secondary | ICD-10-CM

## 2017-03-03 LAB — CUP PACEART INCLINIC DEVICE CHECK
Battery Remaining Longevity: 110 mo
Battery Voltage: 2.99 V
Brady Statistic RA Percent Paced: 88 %
Brady Statistic RV Percent Paced: 97 %
Date Time Interrogation Session: 20180709144751
Implantable Lead Implant Date: 20160329
Implantable Lead Implant Date: 20160329
Implantable Lead Location: 753859
Implantable Lead Location: 753860
Implantable Lead Model: 1948
Implantable Pulse Generator Implant Date: 20160329
Lead Channel Impedance Value: 400 Ohm
Lead Channel Impedance Value: 687.5 Ohm
Lead Channel Pacing Threshold Amplitude: 0.75 V
Lead Channel Pacing Threshold Amplitude: 0.75 V
Lead Channel Pacing Threshold Amplitude: 0.75 V
Lead Channel Pacing Threshold Amplitude: 0.75 V
Lead Channel Pacing Threshold Pulse Width: 0.4 ms
Lead Channel Pacing Threshold Pulse Width: 0.4 ms
Lead Channel Pacing Threshold Pulse Width: 0.4 ms
Lead Channel Pacing Threshold Pulse Width: 0.4 ms
Lead Channel Sensing Intrinsic Amplitude: 12 mV
Lead Channel Sensing Intrinsic Amplitude: 3.1 mV
Lead Channel Setting Pacing Amplitude: 1.125
Lead Channel Setting Pacing Amplitude: 2.5 V
Lead Channel Setting Pacing Pulse Width: 0.4 ms
Lead Channel Setting Sensing Sensitivity: 2.5 mV
Pulse Gen Model: 2240
Pulse Gen Serial Number: 7732903

## 2017-03-03 NOTE — Progress Notes (Signed)
PCP: Seward Carol, MD Primary Cardiologist:Dr Wynonia Lawman (previously Dr Marlou Porch)  Andrew Marshall is a 83 y.o. male who presents today for routine electrophysiology followup.  Since last being seen in our clinic, the patient reports doing reasonably well.  He is recovering from back surgery.  His exercise tolerance when riding bikes is less that it was a year ago, likely due to inactivity.   Today, he denies symptoms of palpitations, chest pain, lower extremity edema, dizziness, presyncope, or syncope.  The patient is otherwise without complaint today.   Past Medical History:  Diagnosis Date  . Allergic rhinitis   . Arthritis    "thumbs primarily" (05/05/2015)  . Basal cell carcinoma, face    "have had several; cut and burned off"  . CAD (coronary artery disease)    a. 2001 s/p post MI- multiple PCI's to LCX/OM;  b. 2003 DES to OM;  c. 05/2012 MV: postlat infarct w/ mild peri-infarct ischemia. nl EF->Med Rx.  . Complete heart block ([place])    St. Jude pacemaker implanted 11/22/14  . ED (erectile dysfunction)   . Esophageal reflux    "slight"  . Essential hypertension, benign   . Heart murmur   . History of atrial fibrillation   . History of GI diverticular bleed    a. 05/2013 and 02/2014 - taken off of ASA for this reason.  . Metatarsalgia   . MI (myocardial infarction) ([place]) 2001  . Moderate mitral insufficiency    a. 10/2014 Echo: EF 55-60%, Gr1 DD, mild AS, mod MR, sev dil LA, nl RV, mildly dil RA, mild TR, PASP 52mmHg.  . Pain, foot, chronic   . Pes planus   . Presence of permanent cardiac pacemaker   . 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 Prostate cancer ([place]) 2008   S/P "radiation and seed implants"  . Pure hypercholesterolemia   . RLS (restless legs syndrome)    Past Surgical History:  Procedure Laterality Date  . APPENDECTOMY  1956  . BASAL CELL CARCINOMA EXCISION     "face"  . CARDIAC CATHETERIZATION N/A 05/05/2015   Procedure: Left Heart Cath and Coronary  Angiography;  Surgeon: Belva Crome, MD;  Location: [facility];  Service: Cardiovascular;  Laterality: N/A;  . CARDIAC CATHETERIZATION N/A 05/05/2015   Procedure: Coronary Stent Intervention;  Surgeon: Belva Crome, MD;  Location: [facility];  Service: Cardiovascular;  Laterality: N/A;  . CATARACT EXTRACTION W/ INTRAOCULAR LENS IMPLANT Left 12/2014  . COLONOSCOPY W/ POLYPECTOMY    . CORONARY ANGIOPLASTY    . CORONARY ANGIOPLASTY WITH STENT PLACEMENT  04/2000; ? ~ 2002; 01/2002  . ESTERNAL BEAM XRT AND SEEDS FOR PROSTATE CANCER    . EYE SURGERY Right 07/2016  . FOOT ARTHRODESIS, TRIPLE Right 2007  . GANGLION CYST EXCISION Right 1970's   wrist  . INSERT / REPLACE / REMOVE PACEMAKER  2017  . LUMBAR LAMINECTOMY/DECOMPRESSION MICRODISCECTOMY Left 09/18/2016   Procedure: LEFT LUMBAR THREE- LUMBAR FOUR LUMBAR LAMINOTOMY AND MICRODISCECTOMY;  Surgeon: Jovita Gamma, MD;  Location: [facility];  Service: Neurosurgery;  Laterality: Left;  LEFT LUMBAR 3- LUMBAR 4 LUMBAR LAMINOTOMY AND MICRODISCECTOMY  . PERMANENT PACEMAKER INSERTION N/A 11/22/2014   Procedure: PERMANENT PACEMAKER INSERTION;  Surgeon: [name], MD;  Location: [facility];  Service: Cardiovascular;  Laterality: N/A;  . PROSTATE BIOPSY    . TONSILLECTOMY  1942    ROS- all systems are reviewed and negative except as per HPI above  Current Outpatient Prescriptions  Medication Sig Dispense Refill  . carvedilol (COREG) 12.5 MG tablet Take 12.5 mg by mouth 2 (two) times daily with a meal.    . aspirin EC 81 MG tablet Take 1 tablet (81 mg total) by mouth daily.    Marland Kitchen atorvastatin (LIPITOR) 40 MG tablet TAKE 1 TABLET (40 MG TOTAL) BY MOUTH DAILY. 90 tablet 2  . cetirizine (ZYRTEC) 10 MG tablet Take 10 mg by mouth daily as needed for allergies.     . Cholecalciferol (VITAMIN D3) 2000 units TABS Take 2,000 Units by mouth every evening.     . clopidogrel (PLAVIX) 75 MG tablet Take 1 tablet (75 mg total) by mouth daily. 90 tablet 3   . Coenzyme Q-10 100 MG capsule Take 100 mg by mouth every evening.     . nitroGLYCERIN (NITROSTAT) 0.4 MG SL tablet Place 1 tablet (0.4 mg total) under the tongue every 5 (five) minutes as needed for chest pain (MAX 3 TABLETS). 25 tablet 3  . sildenafil (VIAGRA) 100 MG tablet Take 100 mg by mouth daily as needed for erectile dysfunction.     . valsartan (DIOVAN) 160 MG tablet Take 1 tablet by mouth in the AM and take 1/2 tablet in the PM     No current facility-administered medications for this visit.     Physical Exam: Vitals:   03/03/17 1213  BP: 116/70  Pulse: 62  SpO2: 97%  Weight: 175 lb 3.2 oz (79.5 kg)  Height: 5' 9.5" (1.765 m)    GEN- The patient is well appearing, alert and oriented x 3 today.   Head- normocephalic, atraumatic Eyes-  Sclera clear, conjunctiva pink Ears- hearing intact Oropharynx- clear Lungs- Clear to ausculation bilaterally, normal work of breathing Chest- pacemaker pocket is well healed Heart- Regular rate and rhythm, no murmurs, rubs or gallops, PMI not laterally displaced GI- soft, NT, ND, + BS Extremities- no clubbing, cyanosis, or edema  Pacemaker interrogation- reviewed in detail today,  See PACEART report  Assessment and Plan:  1. Complete heart block/ sick sinus syndrome Normal pacemaker function See Pace Art report No changes today We discussed reducing coreg or adjusting rate response if his exercise tolerance does not improve as he returns to normal activity.  For now he wishes to "sit tight".  2. afib New diagnosis We discussed at length today Discovered on PPM interrogation today, single 7 min episode in March. Given low burden, will not anticoagulate at this time Will follow remotely and consider anticoagulation if AF burden increases  3. HTN Stable No change required today   [place] remote monitoring Follow-up with Dr Wynonia Lawman as scheduled Return to see me in a year per his preference  [name] MD,  [facility] 03/03/2017 12:47 PM

## 2017-03-03 NOTE — Patient Instructions (Addendum)
Medication Instructions:  Your physician recommends that you continue on your current medications as directed. Please refer to the Current Medication list given to you today.   Labwork: None ordered   Testing/Procedures: None ordered   Follow-Up: Your physician wants you to follow-up in: 12 months with Dr [name] Dennis Bast will receive a reminder letter in the mail two months in advance. If you don't receive a letter, please call our office to schedule the follow-up appointment.  Remote monitoring is used to monitor your  ICD from home. This monitoring reduces the number of office visits required to check your device to one time per year. It allows [place] to keep an eye on the functioning of your device to ensure it is working properly. You are scheduled for a device check from home on 05/05/17. You may send your transmission at any time that day. If you have a wireless device, the transmission will be sent automatically. After your physician reviews your transmission, you will receive a postcard with your next transmission date.

## 2017-03-07 DIAGNOSIS — M7741 Metatarsalgia, right foot: Secondary | ICD-10-CM | POA: Diagnosis not present

## 2017-03-07 DIAGNOSIS — M19072 Primary osteoarthritis, left ankle and foot: Secondary | ICD-10-CM | POA: Diagnosis not present

## 2017-03-20 DIAGNOSIS — R202 Paresthesia of skin: Secondary | ICD-10-CM | POA: Diagnosis not present

## 2017-03-24 DIAGNOSIS — H02834 Dermatochalasis of left upper eyelid: Secondary | ICD-10-CM | POA: Diagnosis not present

## 2017-03-24 DIAGNOSIS — H0279 Other degenerative disorders of eyelid and periocular area: Secondary | ICD-10-CM | POA: Diagnosis not present

## 2017-03-24 DIAGNOSIS — H02413 Mechanical ptosis of bilateral eyelids: Secondary | ICD-10-CM | POA: Diagnosis not present

## 2017-03-24 DIAGNOSIS — H02423 Myogenic ptosis of bilateral eyelids: Secondary | ICD-10-CM | POA: Diagnosis not present

## 2017-03-24 DIAGNOSIS — H02135 Senile ectropion of left lower eyelid: Secondary | ICD-10-CM | POA: Diagnosis not present

## 2017-03-24 DIAGNOSIS — H02831 Dermatochalasis of right upper eyelid: Secondary | ICD-10-CM | POA: Diagnosis not present

## 2017-03-24 DIAGNOSIS — H02132 Senile ectropion of right lower eyelid: Secondary | ICD-10-CM | POA: Diagnosis not present

## 2017-03-27 DIAGNOSIS — H60331 Swimmer's ear, right ear: Secondary | ICD-10-CM | POA: Diagnosis not present

## 2017-03-27 DIAGNOSIS — Z974 Presence of external hearing-aid: Secondary | ICD-10-CM | POA: Diagnosis not present

## 2017-03-27 DIAGNOSIS — H7291 Unspecified perforation of tympanic membrane, right ear: Secondary | ICD-10-CM | POA: Diagnosis not present

## 2017-03-27 DIAGNOSIS — H6122 Impacted cerumen, left ear: Secondary | ICD-10-CM | POA: Diagnosis not present

## 2017-03-27 DIAGNOSIS — H906 Mixed conductive and sensorineural hearing loss, bilateral: Secondary | ICD-10-CM | POA: Diagnosis not present

## 2017-03-31 DIAGNOSIS — H02135 Senile ectropion of left lower eyelid: Secondary | ICD-10-CM | POA: Diagnosis not present

## 2017-03-31 DIAGNOSIS — H02423 Myogenic ptosis of bilateral eyelids: Secondary | ICD-10-CM | POA: Diagnosis not present

## 2017-03-31 DIAGNOSIS — H0279 Other degenerative disorders of eyelid and periocular area: Secondary | ICD-10-CM | POA: Diagnosis not present

## 2017-03-31 DIAGNOSIS — H02132 Senile ectropion of right lower eyelid: Secondary | ICD-10-CM | POA: Diagnosis not present

## 2017-03-31 DIAGNOSIS — H02831 Dermatochalasis of right upper eyelid: Secondary | ICD-10-CM | POA: Diagnosis not present

## 2017-03-31 DIAGNOSIS — H02413 Mechanical ptosis of bilateral eyelids: Secondary | ICD-10-CM | POA: Diagnosis not present

## 2017-03-31 DIAGNOSIS — H02834 Dermatochalasis of left upper eyelid: Secondary | ICD-10-CM | POA: Diagnosis not present

## 2017-04-07 DIAGNOSIS — Z9889 Other specified postprocedural states: Secondary | ICD-10-CM | POA: Diagnosis not present

## 2017-04-07 DIAGNOSIS — Q762 Congenital spondylolisthesis: Secondary | ICD-10-CM | POA: Diagnosis not present

## 2017-04-07 DIAGNOSIS — M4726 Other spondylosis with radiculopathy, lumbar region: Secondary | ICD-10-CM | POA: Diagnosis not present

## 2017-04-07 DIAGNOSIS — M5136 Other intervertebral disc degeneration, lumbar region: Secondary | ICD-10-CM | POA: Diagnosis not present

## 2017-04-21 DIAGNOSIS — I10 Essential (primary) hypertension: Secondary | ICD-10-CM | POA: Diagnosis not present

## 2017-04-21 DIAGNOSIS — H16223 Keratoconjunctivitis sicca, not specified as Sjogren's, bilateral: Secondary | ICD-10-CM | POA: Diagnosis not present

## 2017-04-21 DIAGNOSIS — H02042 Spastic entropion of right lower eyelid: Secondary | ICD-10-CM | POA: Diagnosis not present

## 2017-04-21 DIAGNOSIS — H26492 Other secondary cataract, left eye: Secondary | ICD-10-CM | POA: Diagnosis not present

## 2017-04-23 ENCOUNTER — Ambulatory Visit: Payer: Medicare Other | Attending: Neurosurgery

## 2017-04-23 DIAGNOSIS — M25652 Stiffness of left hip, not elsewhere classified: Secondary | ICD-10-CM

## 2017-04-23 DIAGNOSIS — M545 Low back pain, unspecified: Secondary | ICD-10-CM

## 2017-04-23 DIAGNOSIS — M25651 Stiffness of right hip, not elsewhere classified: Secondary | ICD-10-CM | POA: Insufficient documentation

## 2017-04-23 DIAGNOSIS — G8929 Other chronic pain: Secondary | ICD-10-CM

## 2017-04-23 DIAGNOSIS — M6281 Muscle weakness (generalized): Secondary | ICD-10-CM | POA: Diagnosis not present

## 2017-04-23 NOTE — Patient Instructions (Addendum)
Foot: Heel Raises    Sitting, place right foot flat on floor, knee pointed forward. Lift heel off floor. Do not allow knee to tilt. Keep toes on floor.  2x10, 3-4 times a day  ANKLE: Pump With Resistance Band    With exercise band around arch of involved foot, bend foot and toes toward floor against the band's resistance. Repeat _20-25__ times. Do _2-3__ times per day.  Copyright  VHI. All rights reserved.  Abduction: Clam (Eccentric) - Side-Lying    Lie on side with knees bent. Lift top knee, keeping feet together. Keep trunk steady.  Tighten abdominals.  Do 2x10, 2 times a day.  Perform all exercises below:  Hold _20___ seconds. Repeat _3___ times.  Do __3__ sessions per day. CAUTION: Movement should be gentle, steady and slow.  Knee to Chest  Lying supine, bend involved knee to chest. Perform with each leg.   HIP: Hamstrings - Short Sitting   Rest leg on raised surface. Keep knee straight. Lift chest.      Lifting Principles  .Maintain proper posture and head alignment. .Slide object as close as possible before lifting. .Move obstacles out of the way. .Test before lifting; ask for help if too heavy. .Tighten stomach muscles without holding breath. .Use smooth movements; do not jerk. .Use legs to do the work, and pivot with feet. .Distribute the work load symmetrically and close to the center of trunk. .Push instead of pull whenever possible.   Squat down and hold basket close to stand. Use leg muscles to do the work.    Avoid twisting or bending back. Pivot around using foot movements, and bend at knees if needed when reaching for articles.        Getting Into / Out of Bed   Lower self to lie down on one side by raising legs and lowering head at the same time. Use arms to assist moving without twisting. Bend both knees to roll onto back if desired. To sit up, start from lying on side, and use same move-ments in reverse. Keep trunk aligned with  legs.    Shift weight from front foot to back foot as item is lifted off shelf.    When leaning forward to pick object up from floor, extend one leg out behind. Keep back straight. Hold onto a sturdy support with other hand.      Sit upright, head facing forward. Try using a roll to support lower back. Keep shoulders relaxed, and avoid rounded back. Keep hips level with knees. Avoid crossing legs for long periods.     [facility] [address] [place], [place] [address] Phone # [phone] Fax [phone]

## 2017-04-23 NOTE — Therapy (Signed)
[facility] [address] [place], [place], [address] Phone: [phone]   Fax:  [phone]  Physical Therapy Evaluation  Patient Details  Name: Andrew Marshall MRN: [id number] Date of Birth: July 03, 1931 Referring Provider: Jovita Gamma, MD  Encounter Date: 04/23/2017      PT End of Session - 04/23/17 1226    Visit Number 1   Number of Visits 10   Date for PT Re-Evaluation 06/04/17   PT Start Time 9476   PT Stop Time 1231   PT Time Calculation (min) 44 min   Activity Tolerance Patient tolerated treatment well   Behavior During Therapy [facility] for tasks assessed/performed      Past Medical History:  Diagnosis Date  . Allergic rhinitis   . Arthritis    "thumbs primarily" (05/05/2015)  . Basal cell carcinoma, face    "have had several; cut and burned off"  . CAD (coronary artery disease)    a. 2001 s/p post MI- multiple PCI's to LCX/OM;  b. 2003 DES to OM;  c. 05/2012 MV: postlat infarct w/ mild peri-infarct ischemia. nl EF->Med Rx.  . Complete heart block ([place])    St. Jude pacemaker implanted 11/22/14  . ED (erectile dysfunction)   . Esophageal reflux    "slight"  . Essential hypertension, benign   . Heart murmur   . History of atrial fibrillation   . History of GI diverticular bleed    a. 05/2013 and 02/2014 - taken off of ASA for this reason.  . Metatarsalgia   . MI (myocardial infarction) ([place]) 2001  . Moderate mitral insufficiency    a. 10/2014 Echo: EF 55-60%, Gr1 DD, mild AS, mod MR, sev dil LA, nl RV, mildly dil RA, mild TR, PASP 5mmHg.  . Pain, foot, chronic   . Pes planus   . Presence of permanent cardiac pacemaker   . 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 Prostate cancer ([place]) 2008   S/P "radiation and seed implants"  . Pure hypercholesterolemia   . RLS (restless legs syndrome)     Past Surgical History:  Procedure Laterality Date  . APPENDECTOMY  1956  . BASAL CELL  CARCINOMA EXCISION     "face"  . CARDIAC CATHETERIZATION N/A 05/05/2015   Procedure: Left Heart Cath and Coronary Angiography;  Surgeon: Belva Crome, MD;  Location: [place] [facility];  Service: Cardiovascular;  Laterality: N/A;  . CARDIAC CATHETERIZATION N/A 05/05/2015   Procedure: Coronary Stent Intervention;  Surgeon: Belva Crome, MD;  Location: [place] [facility];  Service: Cardiovascular;  Laterality: N/A;  . CATARACT EXTRACTION W/ INTRAOCULAR LENS IMPLANT Left 12/2014  . COLONOSCOPY W/ POLYPECTOMY    . CORONARY ANGIOPLASTY    . CORONARY ANGIOPLASTY WITH STENT PLACEMENT  04/2000; ? ~ 2002; 01/2002  . ESTERNAL BEAM XRT AND SEEDS FOR PROSTATE CANCER    . EYE SURGERY Right 07/2016  . FOOT ARTHRODESIS, TRIPLE Right 2007  . GANGLION CYST EXCISION Right 1970's   wrist  . INSERT / REPLACE / REMOVE PACEMAKER  2017  . LUMBAR LAMINECTOMY/DECOMPRESSION MICRODISCECTOMY Left 09/18/2016   Procedure: LEFT LUMBAR THREE- LUMBAR FOUR LUMBAR LAMINOTOMY AND MICRODISCECTOMY;  Surgeon: Jovita Gamma, MD;  Location: [place];  Service: Neurosurgery;  Laterality: Left;  LEFT LUMBAR 3- LUMBAR 4 LUMBAR LAMINOTOMY AND MICRODISCECTOMY  . PERMANENT PACEMAKER INSERTION N/A 11/22/2014   Procedure: PERMANENT PACEMAKER INSERTION;  Surgeon: [name], MD;  Location: [facility] C[facility];  Service: Cardiovascular;  Laterality: N/A;  . PROSTATE BIOPSY    . TONSILLECTOMY  1942    There were no vitals filed for this visit.       Subjective Assessment - 04/23/17 1150    Subjective Pt presents to PT s/p lumbar surgery for laminectomy and microdiscectomy surgery performed 08/2016.  Pt returned to bike riding and was up to a good distance before back pain began ~6 weeks ago.  Pt has stopped riding his bike and would like to return to endurance exercises as he is going to [place] in 6 weeks.     Pertinent History lumbar laminectomy and discectomy surgery L3-4 performed 09/18/16, Rt ankle fusion.  PACEMAKER   Patient Stated Goals return  to bike riding, reduce LBP, improve endurance for bike riding and daily activity   Currently in Pain? Yes   Pain Score 1    Pain Location Back   Pain Orientation Lower;Left;Right   Pain Descriptors / Indicators Aching;Sore;Nagging   Pain Type Chronic pain   Pain Onset More than a month ago   Pain Frequency Constant   Aggravating Factors  activity, bending down and extending at the arms   Pain Relieving Factors not being as active            [facility] PT Assessment - 04/23/17 0001      Assessment   Medical Diagnosis s/p lumbar discectomy   Referring Provider Jovita Gamma, MD   Onset Date/Surgical Date 02/24/17   Next MD Visit 6 months   Prior Therapy none     Precautions   Precautions Back     Restrictions   Weight Bearing Restrictions No     Balance Screen   Has the patient fallen in the past 6 months No   Has the patient had a decrease in activity level because of a fear of falling?  No   Is the patient reluctant to leave their home because of a fear of falling?  No     Home Environment   Living Environment Private residence   Living Arrangements Spouse/significant other   Type of [place] to enter   Home Layout Two level     Prior Function   Level of [place] Retired   Leisure bike riding     Cognition   Overall Cognitive Status Within Functional Limits for tasks assessed     Observation/Other Assessments   Focus on Therapeutic Outcomes (FOTO)  35% limitation     Posture/Postural Control   Posture/Postural Control Postural limitations   Postural Limitations Forward head;Rounded Shoulders;Decreased lumbar lordosis;Posterior pelvic tilt;Flexed trunk     ROM / Strength   AROM / PROM / Strength AROM;PROM;Strength     AROM   Overall AROM  Deficits   Overall AROM Comments lumbar A/ROM limited by 25% in all directions with Rt sided lumbar pain with all A/ROM     PROM   Overall PROM  Deficits   Overall  PROM Comments bil hip flexibility is limited by 50% into IR/ER and hamstring flexibility is limited to 50 degrees bil.       Strength   Overall Strength Deficits   Overall Strength Comments Rt LE is 4+/5, Lt LE 4/5 hip, 4+/5 knee.  abdominals 4/5     Palpation   Palpation comment tension in bil gluteals and lumbar paraspinals     Transfers   Transfers Independent with all Transfers     Ambulation/Gait   Gait Pattern  Within Functional Limits            Objective measurements completed on examination: See above findings.                  PT Education - 04/23/17 1224    Education provided Yes   Education Details body mechaincs, hip flexibility, clams, DF    Person(s) Educated Patient   Methods Demonstration;Handout;Explanation   Comprehension Returned demonstration;Verbalized understanding          PT Short Term Goals - 04/23/17 1156      PT SHORT TERM GOAL #1   Title be independent in initial HEP   Time 4   Period Weeks   Status New     PT SHORT TERM GOAL #2   Title verbalize and deomonstrate body mechanics modifications and report abdominal bracing with all activity   Time 4   Period Weeks   Status New     PT SHORT TERM GOAL #3   Title report a 30% reduction in the frequency and intensity of LBP with activity   Time 4   Period Weeks   Status New     PT SHORT TERM GOAL #4   Title improve hip flexibility to get leg over bike with 25% increased ease   Time 4   Period Weeks   Status New           PT Long Term Goals - 04/23/17 1149      PT LONG TERM GOAL #1   Title be independent in advanced HEP   Time 6   Period Weeks   Status New     PT LONG TERM GOAL #2   Title reduce FOTO to < or = to 32% limitation   Time 6   Period Weeks   Status New     PT LONG TERM GOAL #3   Title return to riding bike for regular distance without increased LBP   Time 6   Period Weeks   Status New     PT LONG TERM GOAL #4   Title report a 60%  reduction in the frequency and intensity of LBP with activity   Time 6   Period Weeks   Status New     PT LONG TERM GOAL #5   Title improve LE flexibility to get leg over bike with 60% increased ease   Time 6   Period Weeks   Status New                Plan - 04/23/17 1240    Clinical Impression Statement Pt presents to PT 7 months s/p lumbar laminectomy and discectomy at L3-4.  Pt was doing well after surgery and had return to riding his bike 20 miles.  Pt began to have LBP ~6 weeks ago and stopped bike riding.  Pt would like to return to riding his bike and improve his endurance for a trip to [place] in 6 weeks.  Pt with limited hip flexibility, hip and core strength and Lt LE strength.  Pt with forward head posture and reduced lumbar lordosis.  Pt will benefit from skilled PT for core strength, endurance, flexibility and modalities for pain as needed.     History and Personal Factors relevant to plan of care: PACEMAKER, Rt foot fusion, lumbar surgery   Clinical Presentation Evolving   Clinical Presentation due to: LBP over the past 6 weeks that is new   Clinical Decision Making Moderate   Rehab Potential  Good   PT Frequency 2x / week   PT Duration 6 weeks   PT Treatment/Interventions ADLs/Self Care Home Management;Moist Heat;Ultrasound;Functional mobility training;Patient/family education;Balance training;Therapeutic exercise;Therapeutic activities;Manual techniques;Taping;Passive range of motion   PT Next Visit Plan more body mechanics education, core strength progression, hip flexibility   Consulted and Agree with Plan of Care Patient      Patient will benefit from skilled therapeutic intervention in order to improve the following deficits and impairments:  Decreased range of motion, Pain, Postural dysfunction, Decreased strength, Hypomobility, Impaired flexibility, Improper body mechanics, Decreased activity tolerance, Decreased endurance, Increased muscle spasms  Visit  Diagnosis: Muscle weakness (generalized) - Plan: PT plan of care cert/re-cert  Chronic bilateral low back pain without sciatica - Plan: PT plan of care cert/re-cert  Stiffness of left hip, not elsewhere classified - Plan: PT plan of care cert/re-cert  Stiffness of right hip, not elsewhere classified - Plan: PT plan of care cert/re-cert      G-Codes - 76/19/50 1149    Functional Assessment Tool Used (Outpatient Only) FOTO: 35% limitation   Functional Limitation Other PT primary   Other PT Primary Current Status (D3267) At least 20 percent but less than 40 percent impaired, limited or restricted   Other PT Primary Goal Status (T2458) At least 20 percent but less than 40 percent impaired, limited or restricted       Problem List Patient Active Problem List   Diagnosis Date Noted  . HNP (herniated nucleus pulposus), lumbar 09/18/2016  . Pacemaker 05/10/2015  . Left ventricular dysfunction 05/10/2015  . Coronary artery disease involving native coronary artery of native heart with angina pectoris with documented spasm ([place])   . Angina pectoris, crescendo ([place]) 05/05/2015  . Unstable angina ([place])   . Moderate mitral insufficiency   . Essential hypertension, benign   . History of GI diverticular bleed   . Atrial tachycardia ([place]) 03/01/2015  . RBBB   . Coronary artery disease involving native coronary artery of native heart without angina pectoris   . Complete heart block ([place]) 11/21/2014  . Essential hypertension 11/09/2014  . Old MI (myocardial infarction) 11/09/2014  . Hyperlipidemia 11/09/2014  . Coronary artery disease due to lipid rich plaque 11/09/2014  . Lower gastrointestinal bleeding 03/07/2014  . Orthostatic hypotension 03/07/2014  . GI bleed 03/07/2014  . Bradycardia 03/07/2014  . Diarrhea 03/07/2014  . Pure hypercholesterolemia 11/23/2013  . Insomnia 11/23/2013  . CAD (coronary artery disease) 05/31/2013  . Lower GI bleed 05/31/2013  . UPPER RESPIRATORY  INFECTION, ACUTE, WITH BRONCHITIS 07/30/2010  . FOOT PAIN, CHRONIC 02/19/2008  . CARCINOMA, PROSTATE 11/13/2007  . HYPERTENSION 11/13/2007  . Allergic rhinitis due to allergen 11/13/2007  . ESOPHAGEAL REFLUX 11/13/2007  . COUGH 11/13/2007  . METATARSALGIA 02/24/2007  . Abnormality of gait 02/24/2007   Sigurd Sos, PT 04/23/17 12:51 PM  [facility] [address], [place], [place], [address] Phone: [phone]   Fax:  [phone]  Name: Andrew Marshall MRN: [id number] Date of Birth: [birth year]

## 2017-04-30 ENCOUNTER — Ambulatory Visit: Payer: Medicare Other | Attending: Neurosurgery

## 2017-04-30 DIAGNOSIS — G8929 Other chronic pain: Secondary | ICD-10-CM | POA: Insufficient documentation

## 2017-04-30 DIAGNOSIS — M545 Low back pain, unspecified: Secondary | ICD-10-CM

## 2017-04-30 DIAGNOSIS — M25652 Stiffness of left hip, not elsewhere classified: Secondary | ICD-10-CM | POA: Diagnosis not present

## 2017-04-30 DIAGNOSIS — M25651 Stiffness of right hip, not elsewhere classified: Secondary | ICD-10-CM | POA: Insufficient documentation

## 2017-04-30 DIAGNOSIS — M6281 Muscle weakness (generalized): Secondary | ICD-10-CM | POA: Insufficient documentation

## 2017-04-30 NOTE — Patient Instructions (Addendum)
Lower abdominal/core stability exercises  1. Practice your breathing technique: Inhale through your nose expanding your belly and rib cage. Try not to breathe into your chest. Exhale slowly and gradually out your mouth feeling a sense of softness to your body. Practice multiple times. This can be performed unlimited.  2. Finding the lower abdominals. Laying on your back with the knees bent, place your fingers just below your belly button. Using your breathing technique from above, on your exhale gently pull the belly button away from your fingertips without tensing any other muscles. Practice this 5x. Next, as you exhale, draw belly button inwards and hold onto it...then feel as if you are pulling that muscle across your pelvis like you are tightening a belt. This can be hard to do at first so be patient and practice. Do 5-10 reps 1-3 x day. Always recognize quality over quantity; if your abdominal muscles become tired you will notice you may tighten/contract other muscles. This is the time to take a break.   Practice this first laying on your back, then in sitting, progressing to standing and finally adding it to all your daily movements.   Piriformis Stretch - Supine    Pull uninvolved knee across body to opposite shoulder. Hold slight stretch for _20__ seconds. Repeat with involved leg. Repeat _3__ times. Do _1-2__ times per day.  Copyright  VHI. All rights reserved.   Straight Leg Raise    Tighten stomach and slowly raise locked  Leg.   Repeat _10___ times per set. Do __2__ sets per session. Do ___1-2_ sessions per day.  http://orth.exer.us/1102   Copyright  VHI. All rights reserved.  [place] [address] [place], [place] [address] Phone # [phone] Fax [phone]

## 2017-04-30 NOTE — Therapy (Signed)
[facility] [address], [place], [place], [address] Phone: [phone]   Fax:  [phone]  Physical Therapy Treatment  Patient Details  Name: Andrew Marshall MRN: [id number] Date of Birth: Dec 05, 1930 Referring Provider: Jovita Gamma, MD  Encounter Date: 04/30/2017      PT End of Session - 04/30/17 1443    Visit Number 2   Number of Visits 10   Date for PT Re-Evaluation 06/04/17   PT Start Time 1400   PT Stop Time 1444   PT Time Calculation (min) 44 min   Activity Tolerance Patient tolerated treatment well   Behavior During Therapy [facility] for tasks assessed/performed      Past Medical History:  Diagnosis Date  . Allergic rhinitis   . Arthritis    "thumbs primarily" (05/05/2015)  . Basal cell carcinoma, face    "have had several; cut and burned off"  . CAD (coronary artery disease)    a. 2001 s/p post MI- multiple PCI's to LCX/OM;  b. 2003 DES to OM;  c. 05/2012 MV: postlat infarct w/ mild peri-infarct ischemia. nl EF->Med Rx.  . Complete heart block ([place])    St. Jude pacemaker implanted 11/22/14  . ED (erectile dysfunction)   . Esophageal reflux    "slight"  . Essential hypertension, benign   . Heart murmur   . History of atrial fibrillation   . History of GI diverticular bleed    a. 05/2013 and 02/2014 - taken off of ASA for this reason.  . Metatarsalgia   . MI (myocardial infarction) ([place]) 2001  . Moderate mitral insufficiency    a. 10/2014 Echo: EF 55-60%, Gr1 DD, mild AS, mod MR, sev dil LA, nl RV, mildly dil RA, mild TR, PASP 72mmHg.  . Pain, foot, chronic   . Pes planus   . Presence of permanent cardiac pacemaker   . 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 Prostate cancer ([place]) 2008   S/P "radiation and seed implants"  . Pure hypercholesterolemia   . RLS (restless legs syndrome)     Past Surgical History:  Procedure Laterality Date  . APPENDECTOMY  1956  . BASAL CELL  CARCINOMA EXCISION     "face"  . CARDIAC CATHETERIZATION N/A 05/05/2015   Procedure: Left Heart Cath and Coronary Angiography;  Surgeon: Belva Crome, MD;  Location: [place] [facility];  Service: Cardiovascular;  Laterality: N/A;  . CARDIAC CATHETERIZATION N/A 05/05/2015   Procedure: Coronary Stent Intervention;  Surgeon: Belva Crome, MD;  Location: [place] [facility];  Service: Cardiovascular;  Laterality: N/A;  . CATARACT EXTRACTION W/ INTRAOCULAR LENS IMPLANT Left 12/2014  . COLONOSCOPY W/ POLYPECTOMY    . CORONARY ANGIOPLASTY    . CORONARY ANGIOPLASTY WITH STENT PLACEMENT  04/2000; ? ~ 2002; 01/2002  . ESTERNAL BEAM XRT AND SEEDS FOR PROSTATE CANCER    . EYE SURGERY Right 07/2016  . FOOT ARTHRODESIS, TRIPLE Right 2007  . GANGLION CYST EXCISION Right 1970's   wrist  . INSERT / REPLACE / REMOVE PACEMAKER  2017  . LUMBAR LAMINECTOMY/DECOMPRESSION MICRODISCECTOMY Left 09/18/2016   Procedure: LEFT LUMBAR THREE- LUMBAR FOUR LUMBAR LAMINOTOMY AND MICRODISCECTOMY;  Surgeon: Jovita Gamma, MD;  Location: [place] [facility];  Service: Neurosurgery;  Laterality: Left;  LEFT LUMBAR 3- LUMBAR 4 LUMBAR LAMINOTOMY AND MICRODISCECTOMY  . PERMANENT PACEMAKER INSERTION N/A 11/22/2014   Procedure: PERMANENT PACEMAKER INSERTION;  Surgeon: [name], MD;  [facility];  Service: Cardiovascular;  Laterality: N/A;  . PROSTATE BIOPSY    . TONSILLECTOMY  1942    There were no vitals filed for this visit.      Subjective Assessment - 04/30/17 1401    Subjective Pt has been doing exercises and documenting pain after.  Pt with Rt ankle pain after doing green theraband DF.  Has stopped for now and will ease back in this week.  Pt was able to ride bike for 5 miles today.     Pertinent History lumbar laminectomy and discectomy surgery L3-4 performed 09/18/16, Rt ankle fusion.  PACEMAKER   Patient Stated Goals return to bike riding, reduce LBP, improve endurance for bike riding and daily activity   Currently in Pain?  Yes   Pain Score 1    Pain Location Back   Pain Orientation Lower;Left;Right   Pain Descriptors / Indicators Aching;Sore;Nagging   Pain Type Chronic pain   Pain Onset More than a month ago   Aggravating Factors  activity, bending down and extending arms.     Pain Relieving Factors not being as active                         [facility] Adult PT Treatment/Exercise - 04/30/17 0001      Exercises   Exercises Lumbar;Knee/Hip     Lumbar Exercises: Stretches   Active Hamstring Stretch 2 reps;20 seconds  verbal cues for posture   Single Knee to Chest Stretch 3 reps;20 seconds   Piriformis Stretch 2 reps;20 seconds     Lumbar Exercises: Aerobic   Stationary Bike Level 2 x 6 minutes     Lumbar Exercises: Supine   Ab Set 10 reps   Straight Leg Raise 20 reps  core activation     Lumbar Exercises: Sidelying   Clam 20 reps  emphasis on core activation     Knee/Hip Exercises: Seated   Sit to Sand 20 reps;without UE support                PT Education - 04/30/17 1436    Education provided Yes   Education Details SLR and piriformis stretch   Person(s) Educated Patient   Methods Explanation;Demonstration;Handout   Comprehension Verbalized understanding;Returned demonstration          PT Short Term Goals - 04/23/17 1156      PT SHORT TERM GOAL #1   Title be independent in initial HEP   Time 4   Period Weeks   Status New     PT SHORT TERM GOAL #2   Title verbalize and deomonstrate body mechanics modifications and report abdominal bracing with all activity   Time 4   Period Weeks   Status New     PT SHORT TERM GOAL #3   Title report a 30% reduction in the frequency and intensity of LBP with activity   Time 4   Period Weeks   Status New     PT SHORT TERM GOAL #4   Title improve hip flexibility to get leg over bike with 25% increased ease   Time 4   Period Weeks   Status New           PT Long Term Goals - 04/23/17 1149      PT LONG TERM  GOAL #1   Title be independent in advanced HEP   Time 6   Period Weeks   Status New     PT LONG TERM GOAL #2  Title reduce FOTO to < or = to 32% limitation   Time 6   Period Weeks   Status New     PT LONG TERM GOAL #3   Title return to riding bike for regular distance without increased LBP   Time 6   Period Weeks   Status New     PT LONG TERM GOAL #4   Title report a 60% reduction in the frequency and intensity of LBP with activity   Time 6   Period Weeks   Status New     PT LONG TERM GOAL #5   Title improve LE flexibility to get leg over bike with 60% increased ease   Time 6   Period Weeks   Status New               Plan - 04/30/17 1411    Clinical Impression Statement Pt with only1 session after evaluation.  Pt has been performing HEP and was able to do a 5 mile bike ride today.  Pt with mild LBP with activity.  Pt is making body mechanics modifications with home tasks.  Pt with forward head and reduced lordosis and weak abdominals.  Pt will benefit from continued PT for core strength, endurance, flexibility and LE strength.     Rehab Potential Good   PT Frequency 2x / week   PT Duration 6 weeks   PT Treatment/Interventions ADLs/Self Care Home Management;Moist Heat;Ultrasound;Functional mobility training;Patient/family education;Balance training;Therapeutic exercise;Therapeutic activities;Manual techniques;Taping;Passive range of motion   PT Next Visit Plan core strength progression, hip flexibility   Consulted and Agree with Plan of Care Patient      Patient will benefit from skilled therapeutic intervention in order to improve the following deficits and impairments:  Decreased range of motion, Pain, Postural dysfunction, Decreased strength, Hypomobility, Impaired flexibility, Improper body mechanics, Decreased activity tolerance, Decreased endurance, Increased muscle spasms  Visit Diagnosis: Muscle weakness (generalized)  Chronic bilateral low back pain  without sciatica  Stiffness of left hip, not elsewhere classified  Stiffness of right hip, not elsewhere classified     Problem List Patient Active Problem List   Diagnosis Date Noted  . HNP (herniated nucleus pulposus), lumbar 09/18/2016  . Pacemaker 05/10/2015  . Left ventricular dysfunction 05/10/2015  . Coronary artery disease involving native coronary artery of native heart with angina pectoris with documented spasm ([place])   . Angina pectoris, crescendo ([place]) 05/05/2015  . Unstable angina ([place])   . Moderate mitral insufficiency   . Essential hypertension, benign   . History of GI diverticular bleed   . Atrial tachycardia ([place]) 03/01/2015  . RBBB   . Coronary artery disease involving native coronary artery of native heart without angina pectoris   . Complete heart block ([place]) 11/21/2014  . Essential hypertension 11/09/2014  . Old MI (myocardial infarction) 11/09/2014  . Hyperlipidemia 11/09/2014  . Coronary artery disease due to lipid rich plaque 11/09/2014  . Lower gastrointestinal bleeding 03/07/2014  . Orthostatic hypotension 03/07/2014  . GI bleed 03/07/2014  . Bradycardia 03/07/2014  . Diarrhea 03/07/2014  . Pure hypercholesterolemia 11/23/2013  . Insomnia 11/23/2013  . CAD (coronary artery disease) 05/31/2013  . Lower GI bleed 05/31/2013  . UPPER RESPIRATORY INFECTION, ACUTE, WITH BRONCHITIS 07/30/2010  . FOOT PAIN, CHRONIC 02/19/2008  . CARCINOMA, PROSTATE 11/13/2007  . HYPERTENSION 11/13/2007  . Allergic rhinitis due to allergen 11/13/2007  . ESOPHAGEAL REFLUX 11/13/2007  . COUGH 11/13/2007  . METATARSALGIA 02/24/2007  . Abnormality of gait 02/24/2007  Sigurd Sos, PT 04/30/17 2:46 PM  [facility] [address], [place], [place], [address] Phone: [phone]   Fax:  [phone]  Name: ISAAH FURRY MRN: [id number] Date of Birth: [birth year]

## 2017-05-02 ENCOUNTER — Encounter: Payer: Medicare Other | Admitting: Rehabilitation

## 2017-05-03 ENCOUNTER — Other Ambulatory Visit: Payer: Self-pay | Admitting: Internal Medicine

## 2017-05-05 ENCOUNTER — Telehealth: Payer: Self-pay | Admitting: Internal Medicine

## 2017-05-05 ENCOUNTER — Ambulatory Visit (INDEPENDENT_AMBULATORY_CARE_PROVIDER_SITE_OTHER): Payer: Medicare Other | Admitting: *Deleted

## 2017-05-05 DIAGNOSIS — I495 Sick sinus syndrome: Secondary | ICD-10-CM

## 2017-05-05 MED ORDER — IRBESARTAN 150 MG PO TABS: 150.0000 mg | ORAL_TABLET | Freq: Every day | ORAL | 3 refills | Status: DC

## 2017-05-05 NOTE — Progress Notes (Signed)
Remote pacemaker transmission.   

## 2017-05-05 NOTE — Telephone Encounter (Signed)
Called pt and he states he has gotten his valsartan from a different pharmacy that was able to order it. Pharmacy is aware.

## 2017-05-05 NOTE — Telephone Encounter (Signed)
New message    Lisabeth Devoid from [employer] is calling to find out about pt Diovan prescription. Please call.   Pt c/o medication issue:  1. Name of Medication: Diovan 160 mg  2. How are you currently taking this medication (dosage and times per day)? 160 mg  3. Are you having a reaction (difficulty breathing--STAT)? no  4. What is your medication issue? Med was recalled.

## 2017-05-07 ENCOUNTER — Encounter: Payer: Self-pay | Admitting: Cardiology

## 2017-05-07 ENCOUNTER — Ambulatory Visit: Payer: Medicare Other | Admitting: Physical Therapy

## 2017-05-07 ENCOUNTER — Encounter: Payer: Self-pay | Admitting: Physical Therapy

## 2017-05-07 DIAGNOSIS — M25652 Stiffness of left hip, not elsewhere classified: Secondary | ICD-10-CM | POA: Diagnosis not present

## 2017-05-07 DIAGNOSIS — M25651 Stiffness of right hip, not elsewhere classified: Secondary | ICD-10-CM

## 2017-05-07 DIAGNOSIS — M6281 Muscle weakness (generalized): Secondary | ICD-10-CM | POA: Diagnosis not present

## 2017-05-07 DIAGNOSIS — G8929 Other chronic pain: Secondary | ICD-10-CM | POA: Diagnosis not present

## 2017-05-07 DIAGNOSIS — M545 Low back pain, unspecified: Secondary | ICD-10-CM

## 2017-05-07 NOTE — Therapy (Signed)
[facility] [address], [place], [place], [address] Phone: [phone]   Fax:  [phone]  Physical Therapy Treatment  Patient Details  Name: Andrew Marshall MRN: [id number] Date of Birth: Sep 22, 1930 Referring Provider: Jovita Gamma, MD  Encounter Date: 05/07/2017      PT End of Session - 05/07/17 1504    Visit Number 3   Number of Visits 10   Date for PT Re-Evaluation 06/04/17   PT Start Time 8250   PT Stop Time 1529   PT Time Calculation (min) 43 min   Activity Tolerance Patient tolerated treatment well   Behavior During Therapy [facility] for tasks assessed/performed      Past Medical History:  Diagnosis Date  . Allergic rhinitis   . Arthritis    "thumbs primarily" (05/05/2015)  . Basal cell carcinoma, face    "have had several; cut and burned off"  . CAD (coronary artery disease)    a. 2001 s/p post MI- multiple PCI's to LCX/OM;  b. 2003 DES to OM;  c. 05/2012 MV: postlat infarct w/ mild peri-infarct ischemia. nl EF->Med Rx.  . Complete heart block ([place])    St. Jude pacemaker implanted 11/22/14  . ED (erectile dysfunction)   . Esophageal reflux    "slight"  . Essential hypertension, benign   . Heart murmur   . History of atrial fibrillation   . History of GI diverticular bleed    a. 05/2013 and 02/2014 - taken off of ASA for this reason.  . Metatarsalgia   . MI (myocardial infarction) ([place]) 2001  . Moderate mitral insufficiency    a. 10/2014 Echo: EF 55-60%, Gr1 DD, mild AS, mod MR, sev dil LA, nl RV, mildly dil RA, mild TR, PASP 6mmHg.  . Pain, foot, chronic   . Pes planus   . Presence of permanent cardiac pacemaker   . 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 Prostate cancer ([place]) 2008   S/P "radiation and seed implants"  . Pure hypercholesterolemia   . RLS (restless legs syndrome)     Past Surgical History:  Procedure Laterality Date  . APPENDECTOMY  1956  . BASAL CELL  CARCINOMA EXCISION     "face"  . CARDIAC CATHETERIZATION N/A 05/05/2015   Procedure: Left Heart Cath and Coronary Angiography;  Surgeon: Belva Crome, MD;  Location: [place] [facility];  Service: Cardiovascular;  Laterality: N/A;  . CARDIAC CATHETERIZATION N/A 05/05/2015   Procedure: Coronary Stent Intervention;  Surgeon: Belva Crome, MD;  Location: [place] [facility];  Service: Cardiovascular;  Laterality: N/A;  . CATARACT EXTRACTION W/ INTRAOCULAR LENS IMPLANT Left 12/2014  . COLONOSCOPY W/ POLYPECTOMY    . CORONARY ANGIOPLASTY    . CORONARY ANGIOPLASTY WITH STENT PLACEMENT  04/2000; ? ~ 2002; 01/2002  . ESTERNAL BEAM XRT AND SEEDS FOR PROSTATE CANCER    . EYE SURGERY Right 07/2016  . FOOT ARTHRODESIS, TRIPLE Right 2007  . GANGLION CYST EXCISION Right 1970's   wrist  . INSERT / REPLACE / REMOVE PACEMAKER  2017  . LUMBAR LAMINECTOMY/DECOMPRESSION MICRODISCECTOMY Left 09/18/2016   Procedure: LEFT LUMBAR THREE- LUMBAR FOUR LUMBAR LAMINOTOMY AND MICRODISCECTOMY;  Surgeon: Jovita Gamma, MD;  Location: [place];  Service: Neurosurgery;  Laterality: Left;  LEFT LUMBAR 3- LUMBAR 4 LUMBAR LAMINOTOMY AND MICRODISCECTOMY  . PERMANENT PACEMAKER INSERTION N/A 11/22/2014   Procedure: PERMANENT PACEMAKER INSERTION;  Surgeon: [name], MD;  Location: [facility] CATH LAB;  Service: Cardiovascular;  Laterality: N/A;  . PROSTATE BIOPSY    . TONSILLECTOMY  1942    There were no vitals filed for this visit.      Subjective Assessment - 05/07/17 1450    Subjective I am able to get on the bike again.  I am up to riding my bike 9 miles now.  I have been having more back pain when I do the plantarflexion against resisistance 2x/day, so I am only doing it 1x   Pertinent History lumbar laminectomy and discectomy surgery L3-4 performed 09/18/16, Rt ankle fusion.  PACEMAKER   Patient Stated Goals return to bike riding, reduce LBP, improve endurance for bike riding and daily activity   Currently in Pain? No/denies                          [facility] Adult PT Treatment/Exercise - 05/07/17 0001      Lumbar Exercises: Supine   Ab Set 10 reps  with breathing   Straight Leg Raise 20 reps  core activation   Large Ball Oblique Isometric 15 reps  roll out with red ball and overhead reach     Lumbar Exercises: Sidelying   Clam 15 reps     Knee/Hip Exercises: Standing   Walking with Sports Cord backwards only 20#     Knee/Hip Exercises: Seated   Sit to Sand without UE support;3 sets;10 reps  10 with ball squeeze; 20 with green band ABd                  PT Short Term Goals - 05/07/17 1453      PT SHORT TERM GOAL #1   Title be independent in initial HEP   Time 4   Period Weeks   Status Achieved     PT SHORT TERM GOAL #2   Title verbalize and deomonstrate body mechanics modifications and report abdominal bracing with all activity   Time 4   Period Weeks   Status On-going     PT SHORT TERM GOAL #3   Title report a 30% reduction in the frequency and intensity of LBP with activity   Baseline pain when kneeling down to weed   Time 4   Period Weeks   Status On-going     PT SHORT TERM GOAL #4   Title improve hip flexibility to get leg over bike with 25% increased ease   Time 4   Period Weeks   Status Achieved           PT Long Term Goals - 04/23/17 1149      PT LONG TERM GOAL #1   Title be independent in advanced HEP   Time 6   Period Weeks   Status New     PT LONG TERM GOAL #2   Title reduce FOTO to < or = to 32% limitation   Time 6   Period Weeks   Status New     PT LONG TERM GOAL #3   Title return to riding bike for regular distance without increased LBP   Time 6   Period Weeks   Status New     PT LONG TERM GOAL #4   Title report a 60% reduction in the frequency and intensity of LBP with activity   Time 6   Period Weeks   Status New     PT LONG TERM GOAL #5   Title improve LE flexibility to get leg over bike with 60%  increased ease   Time 6    Period Weeks   Status New               Plan - 05/07/17 1453    Clinical Impression Statement Pt has reduced pain and able to lift LE over the bike in order to begin bike riding.  Pt has been working on abdominal and core engaging with movements and is making progress.  He tends to flare ribcage with core contraction.  Pt did well with verbal cues and able to perform exercises correctly with good technique.  Pt will benefit from skilled PT to improve abdominal muscle coordination for improved lumbar stabilty during functional activiities.   Rehab Potential Good   PT Treatment/Interventions ADLs/Self Care Home Management;Moist Heat;Ultrasound;Functional mobility training;Patient/family education;Balance training;Therapeutic exercise;Therapeutic activities;Manual techniques;Taping;Passive range of motion   PT Next Visit Plan core strength progression, hip flexibility   Consulted and Agree with Plan of Care Patient      Patient will benefit from skilled therapeutic intervention in order to improve the following deficits and impairments:  Decreased range of motion, Pain, Postural dysfunction, Decreased strength, Hypomobility, Impaired flexibility, Improper body mechanics, Decreased activity tolerance, Decreased endurance, Increased muscle spasms  Visit Diagnosis: Muscle weakness (generalized)  Chronic bilateral low back pain without sciatica  Stiffness of left hip, not elsewhere classified  Stiffness of right hip, not elsewhere classified     Problem List Patient Active Problem List   Diagnosis Date Noted  . HNP (herniated nucleus pulposus), lumbar 09/18/2016  . Pacemaker 05/10/2015  . Left ventricular dysfunction 05/10/2015  . Coronary artery disease involving native coronary artery of native heart with angina pectoris with documented spasm ([place])   . Angina pectoris, crescendo ([place]) 05/05/2015  . Unstable angina ([place])   . Moderate mitral insufficiency   . Essential  hypertension, benign   . History of GI diverticular bleed   . Atrial tachycardia ([place]) 03/01/2015  . RBBB   . Coronary artery disease involving native coronary artery of native heart without angina pectoris   . Complete heart block ([place]) 11/21/2014  . Essential hypertension 11/09/2014  . Old MI (myocardial infarction) 11/09/2014  . Hyperlipidemia 11/09/2014  . Coronary artery disease due to lipid rich plaque 11/09/2014  . Lower gastrointestinal bleeding 03/07/2014  . Orthostatic hypotension 03/07/2014  . GI bleed 03/07/2014  . Bradycardia 03/07/2014  . Diarrhea 03/07/2014  . Pure hypercholesterolemia 11/23/2013  . Insomnia 11/23/2013  . CAD (coronary artery disease) 05/31/2013  . Lower GI bleed 05/31/2013  . UPPER RESPIRATORY INFECTION, ACUTE, WITH BRONCHITIS 07/30/2010  . FOOT PAIN, CHRONIC 02/19/2008  . CARCINOMA, PROSTATE 11/13/2007  . HYPERTENSION 11/13/2007  . Allergic rhinitis due to allergen 11/13/2007  . ESOPHAGEAL REFLUX 11/13/2007  . COUGH 11/13/2007  . METATARSALGIA 02/24/2007  . Abnormality of gait 02/24/2007    Zannie Cove, PT 05/07/2017, 3:32 PM  [facility] [address], [place], [place], [address] Phone: [phone]   Fax:  [phone]  Name: Andrew Marshall MRN: [id number] Date of Birth: 1931-04-25

## 2017-05-08 LAB — CUP PACEART REMOTE DEVICE CHECK
Date Time Interrogation Session: 20180913110925
Implantable Lead Implant Date: 20160329
Implantable Lead Implant Date: 20160329
Implantable Lead Location: 753859
Implantable Lead Location: 753860
Implantable Lead Model: 1948
Implantable Pulse Generator Implant Date: 20160329
Lead Channel Setting Pacing Amplitude: 1.125
Lead Channel Setting Pacing Amplitude: 2.5 V
Lead Channel Setting Pacing Pulse Width: 0.4 ms
Lead Channel Setting Sensing Sensitivity: 2.5 mV
Pulse Gen Model: 2240
Pulse Gen Serial Number: 7732903

## 2017-05-09 ENCOUNTER — Ambulatory Visit: Payer: Medicare Other | Admitting: Physical Therapy

## 2017-05-09 DIAGNOSIS — G8929 Other chronic pain: Secondary | ICD-10-CM

## 2017-05-09 DIAGNOSIS — M545 Low back pain, unspecified: Secondary | ICD-10-CM

## 2017-05-09 DIAGNOSIS — M25651 Stiffness of right hip, not elsewhere classified: Secondary | ICD-10-CM

## 2017-05-09 DIAGNOSIS — H906 Mixed conductive and sensorineural hearing loss, bilateral: Secondary | ICD-10-CM | POA: Diagnosis not present

## 2017-05-09 DIAGNOSIS — M25652 Stiffness of left hip, not elsewhere classified: Secondary | ICD-10-CM | POA: Diagnosis not present

## 2017-05-09 DIAGNOSIS — H60331 Swimmer's ear, right ear: Secondary | ICD-10-CM | POA: Diagnosis not present

## 2017-05-09 DIAGNOSIS — M6281 Muscle weakness (generalized): Secondary | ICD-10-CM | POA: Diagnosis not present

## 2017-05-09 NOTE — Patient Instructions (Signed)
   HIP ABDUCTION - STANDING   While standing, raise your leg out to the side. Keep your knee straight and maintain your toes pointed forward the entire time.   Use your arms for support if needed for balance and safety.  2 sets of 10 reps    HIP EXTENSION - STANDING  While standing, balance on one leg and move your other leg in a backward direction. Do not swing the leg. Perform smooth and controlled movements.  Keep your trunk stable and without arching during the movement.  Use your arms for support if needed for balance and safety.  2 sets of 10 reps    Standing Hamstring Stretch  Place your heel on a step/stair, bring your toes towards you. Keeping your back straight bend forward at the hips until you feel the stretch. Hold 30 sec repeat 3 x    HIP FLEXOR STRETCH - FOOT ON CHAIR  While standing, place your foot on a chair as shown. Next, bend your knee to stretch the front of the thigh on the leg that is on the ground. Hold 30 sec repeat 3x     Piriformis Stretch (Figure Four)  While lying down, bend up one knee keeping the foot on the mat or floor. Bend opposite leg and cross ankle over the bent knee. Gently push inside of crossed leg at knee. You should feel the stretch in the back of the buttock of crossed leg. Hold 30 sec repeat 3x

## 2017-05-09 NOTE — Therapy (Signed)
[facility] [address] [place], [place], [address] Phone: [phone]   Fax:  [phone]  Physical Therapy Treatment  Patient Details  Name: Andrew Marshall MRN: [id number] Date of Birth: 15-Dec-1930 Referring Provider: Jovita Gamma, MD  Encounter Date: 05/09/2017      PT End of Session - 05/09/17 0856    Visit Number 4   Number of Visits 10   Date for PT Re-Evaluation 06/04/17   PT Start Time 0852   PT Stop Time 0930   PT Time Calculation (min) 38 min   Activity Tolerance Patient tolerated treatment well   Behavior During Therapy [facility] for tasks assessed/performed      Past Medical History:  Diagnosis Date  . Allergic rhinitis   . Arthritis    "thumbs primarily" (05/05/2015)  . Basal cell carcinoma, face    "have had several; cut and burned off"  . CAD (coronary artery disease)    a. 2001 s/p post MI- multiple PCI's to LCX/OM;  b. 2003 DES to OM;  c. 05/2012 MV: postlat infarct w/ mild peri-infarct ischemia. nl EF->Med Rx.  . Complete heart block ([place])    St. Jude pacemaker implanted 11/22/14  . ED (erectile dysfunction)   . Esophageal reflux    "slight"  . Essential hypertension, benign   . Heart murmur   . History of atrial fibrillation   . History of GI diverticular bleed    a. 05/2013 and 02/2014 - taken off of ASA for this reason.  . Metatarsalgia   . MI (myocardial infarction) ([place]) 2001  . Moderate mitral insufficiency    a. 10/2014 Echo: EF 55-60%, Gr1 DD, mild AS, mod MR, sev dil LA, nl RV, mildly dil RA, mild TR, PASP 11mmHg.  . Pain, foot, chronic   . Pes planus   . Presence of permanent cardiac pacemaker   . 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 Prostate cancer ([place]) 2008   S/P "radiation and seed implants"  . Pure hypercholesterolemia   . RLS (restless legs syndrome)     Past Surgical History:  Procedure Laterality Date  . APPENDECTOMY  1956  . BASAL CELL  CARCINOMA EXCISION     "face"  . CARDIAC CATHETERIZATION N/A 05/05/2015   Procedure: Left Heart Cath and Coronary Angiography;  Surgeon: Belva Crome, MD;  Location: [place] [facility];  Service: Cardiovascular;  Laterality: N/A;  . CARDIAC CATHETERIZATION N/A 05/05/2015   Procedure: Coronary Stent Intervention;  Surgeon: Belva Crome, MD;  Location: [place] [facility];  Service: Cardiovascular;  Laterality: N/A;  . CATARACT EXTRACTION W/ INTRAOCULAR LENS IMPLANT Left 12/2014  . COLONOSCOPY W/ POLYPECTOMY    . CORONARY ANGIOPLASTY    . CORONARY ANGIOPLASTY WITH STENT PLACEMENT  04/2000; ? ~ 2002; 01/2002  . ESTERNAL BEAM XRT AND SEEDS FOR PROSTATE CANCER    . EYE SURGERY Right 07/2016  . FOOT ARTHRODESIS, TRIPLE Right 2007  . GANGLION CYST EXCISION Right 1970's   wrist  . INSERT / REPLACE / REMOVE PACEMAKER  2017  . LUMBAR LAMINECTOMY/DECOMPRESSION MICRODISCECTOMY Left 09/18/2016   Procedure: LEFT LUMBAR THREE- LUMBAR FOUR LUMBAR LAMINOTOMY AND MICRODISCECTOMY;  Surgeon: Jovita Gamma, MD;  Location: [place];  Service: Neurosurgery;  Laterality: Left;  LEFT LUMBAR 3- LUMBAR 4 LUMBAR LAMINOTOMY AND MICRODISCECTOMY  . PERMANENT PACEMAKER INSERTION N/A 11/22/2014   Procedure: PERMANENT PACEMAKER INSERTION;  Surgeon: [name], MD;  Location: [facility] CATH LAB;  Service: Cardiovascular;  Laterality: N/A;  . PROSTATE BIOPSY    . TONSILLECTOMY  1942    There were no vitals filed for this visit.      Subjective Assessment - 05/09/17 0856    Subjective Denies pain this morning.  Still having some pain with ankle PF with band.   Pertinent History lumbar laminectomy and discectomy surgery L3-4 performed 09/18/16, Rt ankle fusion.  PACEMAKER   Patient Stated Goals return to bike riding, reduce LBP, improve endurance for bike riding and daily activity   Currently in Pain? No/denies                         [facility] Adult PT Treatment/Exercise - 05/09/17 0001      Lumbar Exercises: Aerobic    Stationary Bike Level 2 x 6 minutes     Lumbar Exercises: Standing   Heel Raises 20 reps   Other Standing Lumbar Exercises standing hip 3 ways - 2 x 10     Knee/Hip Exercises: Stretches   Active Hamstring Stretch Both;3 reps;30 seconds   Hip Flexor Stretch Both;3 reps;30 seconds   Piriformis Stretch Both;3 reps;30 seconds     Knee/Hip Exercises: Standing   Walking with Sports Cord 20# back and side to side - 5x each way                PT Education - 05/09/17 0935    Education provided Yes   Education Details standing hip abd, ext, piriformis stretch, h/s stretch, hip flexor stretch   Person(s) Educated Patient   Methods Explanation;Demonstration;Handout   Comprehension Verbalized understanding;Returned demonstration          PT Short Term Goals - 05/07/17 1453      PT SHORT TERM GOAL #1   Title be independent in initial HEP   Time 4   Period Weeks   Status Achieved     PT SHORT TERM GOAL #2   Title verbalize and deomonstrate body mechanics modifications and report abdominal bracing with all activity   Time 4   Period Weeks   Status On-going     PT SHORT TERM GOAL #3   Title report a 30% reduction in the frequency and intensity of LBP with activity   Baseline pain when kneeling down to weed   Time 4   Period Weeks   Status On-going     PT SHORT TERM GOAL #4   Title improve hip flexibility to get leg over bike with 25% increased ease   Time 4   Period Weeks   Status Achieved           PT Long Term Goals - 04/23/17 1149      PT LONG TERM GOAL #1   Title be independent in advanced HEP   Time 6   Period Weeks   Status New     PT LONG TERM GOAL #2   Title reduce FOTO to < or = to 32% limitation   Time 6   Period Weeks   Status New     PT LONG TERM GOAL #3   Title return to riding bike for regular distance without increased LBP   Time 6   Period Weeks   Status New     PT LONG TERM GOAL #4   Title report a 60% reduction in the  frequency and intensity of LBP with activity   Time 6   Period Weeks   Status New  PT LONG TERM GOAL #5   Title improve LE flexibility to get leg over bike with 60% increased ease   Time 6   Period Weeks   Status New               Plan - 05/09/17 0857    Clinical Impression Statement Patient did well with exercises, became very fatigued with lateral movements.  He needs cues to stabilize pelvis and low back throughout.  Pt continues to need skilled PT for improved lateral hip stability and core control.   PT Treatment/Interventions ADLs/Self Care Home Management;Moist Heat;Ultrasound;Functional mobility training;Patient/family education;Balance training;Therapeutic exercise;Therapeutic activities;Manual techniques;Taping;Passive range of motion   PT Next Visit Plan core strength progression, hip flexibility   Consulted and Agree with Plan of Care Patient      Patient will benefit from skilled therapeutic intervention in order to improve the following deficits and impairments:  Decreased range of motion, Pain, Postural dysfunction, Decreased strength, Hypomobility, Impaired flexibility, Improper body mechanics, Decreased activity tolerance, Decreased endurance, Increased muscle spasms  Visit Diagnosis: Muscle weakness (generalized)  Chronic bilateral low back pain without sciatica  Stiffness of left hip, not elsewhere classified  Stiffness of right hip, not elsewhere classified     Problem List Patient Active Problem List   Diagnosis Date Noted  . HNP (herniated nucleus pulposus), lumbar 09/18/2016  . Pacemaker 05/10/2015  . Left ventricular dysfunction 05/10/2015  . Coronary artery disease involving native coronary artery of native heart with angina pectoris with documented spasm ([place])   . Angina pectoris, crescendo ([place]) 05/05/2015  . Unstable angina ([place])   . Moderate mitral insufficiency   . Essential hypertension, benign   . History of GI diverticular bleed    . Atrial tachycardia ([place]) 03/01/2015  . RBBB   . Coronary artery disease involving native coronary artery of native heart without angina pectoris   . Complete heart block ([place]) 11/21/2014  . Essential hypertension 11/09/2014  . Old MI (myocardial infarction) 11/09/2014  . Hyperlipidemia 11/09/2014  . Coronary artery disease due to lipid rich plaque 11/09/2014  . Lower gastrointestinal bleeding 03/07/2014  . Orthostatic hypotension 03/07/2014  . GI bleed 03/07/2014  . Bradycardia 03/07/2014  . Diarrhea 03/07/2014  . Pure hypercholesterolemia 11/23/2013  . Insomnia 11/23/2013  . CAD (coronary artery disease) 05/31/2013  . Lower GI bleed 05/31/2013  . UPPER RESPIRATORY INFECTION, ACUTE, WITH BRONCHITIS 07/30/2010  . FOOT PAIN, CHRONIC 02/19/2008  . CARCINOMA, PROSTATE 11/13/2007  . HYPERTENSION 11/13/2007  . Allergic rhinitis due to allergen 11/13/2007  . ESOPHAGEAL REFLUX 11/13/2007  . COUGH 11/13/2007  . METATARSALGIA 02/24/2007  . Abnormality of gait 02/24/2007    Zannie Cove, PT 05/09/2017, 9:39 AM  [facility] [address] [place], [place], [address] Phone: [phone]   Fax:  [phone]  Name: Andrew Marshall MRN: [id number] Date of Birth: 28-Jan-1931

## 2017-05-12 ENCOUNTER — Ambulatory Visit: Payer: Medicare Other

## 2017-05-12 DIAGNOSIS — M25651 Stiffness of right hip, not elsewhere classified: Secondary | ICD-10-CM | POA: Diagnosis not present

## 2017-05-12 DIAGNOSIS — M545 Low back pain, unspecified: Secondary | ICD-10-CM

## 2017-05-12 DIAGNOSIS — M25652 Stiffness of left hip, not elsewhere classified: Secondary | ICD-10-CM | POA: Diagnosis not present

## 2017-05-12 DIAGNOSIS — M6281 Muscle weakness (generalized): Secondary | ICD-10-CM

## 2017-05-12 DIAGNOSIS — G8929 Other chronic pain: Secondary | ICD-10-CM

## 2017-05-12 NOTE — Therapy (Signed)
[facility] [address] [place], [place], [address] Phone: [phone]   Fax:  [phone]  Physical Therapy Treatment  Patient Details  Name: Andrew Marshall MRN: [id number] Date of Birth: December 12, 1930 Referring Provider: Jovita Gamma, MD  Encounter Date: 05/12/2017      PT End of Session - 05/12/17 1522    Visit Number 5   Number of Visits 10   Date for PT Re-Evaluation 06/04/17   PT Start Time 8099   PT Stop Time 1523   PT Time Calculation (min) 38 min   Activity Tolerance Patient tolerated treatment well   Behavior During Therapy [facility] for tasks assessed/performed      Past Medical History:  Diagnosis Date  . Allergic rhinitis   . Arthritis    "thumbs primarily" (05/05/2015)  . Basal cell carcinoma, face    "have had several; cut and burned off"  . CAD (coronary artery disease)    a. 2001 s/p post MI- multiple PCI's to LCX/OM;  b. 2003 DES to OM;  c. 05/2012 MV: postlat infarct w/ mild peri-infarct ischemia. nl EF->Med Rx.  . Complete heart block ([place])    St. Jude pacemaker implanted 11/22/14  . ED (erectile dysfunction)   . Esophageal reflux    "slight"  . Essential hypertension, benign   . Heart murmur   . History of atrial fibrillation   . History of GI diverticular bleed    a. 05/2013 and 02/2014 - taken off of ASA for this reason.  . Metatarsalgia   . MI (myocardial infarction) ([place]) 2001  . Moderate mitral insufficiency    a. 10/2014 Echo: EF 55-60%, Gr1 DD, mild AS, mod MR, sev dil LA, nl RV, mildly dil RA, mild TR, PASP 8mmHg.  . Pain, foot, chronic   . Pes planus   . Presence of permanent cardiac pacemaker   . 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 Prostate cancer ([place]) 2008   S/P "radiation and seed implants"  . Pure hypercholesterolemia   . RLS (restless legs syndrome)     Past Surgical History:  Procedure Laterality Date  . APPENDECTOMY  1956  . BASAL CELL  CARCINOMA EXCISION     "face"  . CARDIAC CATHETERIZATION N/A 05/05/2015   Procedure: Left Heart Cath and Coronary Angiography;  Surgeon: Belva Crome, MD;  Location: [place] [facility];  Service: Cardiovascular;  Laterality: N/A;  . CARDIAC CATHETERIZATION N/A 05/05/2015   Procedure: Coronary Stent Intervention;  Surgeon: Belva Crome, MD;  Location: [place] [facility];  Service: Cardiovascular;  Laterality: N/A;  . CATARACT EXTRACTION W/ INTRAOCULAR LENS IMPLANT Left 12/2014  . COLONOSCOPY W/ POLYPECTOMY    . CORONARY ANGIOPLASTY    . CORONARY ANGIOPLASTY WITH STENT PLACEMENT  04/2000; ? ~ 2002; 01/2002  . ESTERNAL BEAM XRT AND SEEDS FOR PROSTATE CANCER    . EYE SURGERY Right 07/2016  . FOOT ARTHRODESIS, TRIPLE Right 2007  . GANGLION CYST EXCISION Right 1970's   wrist  . INSERT / REPLACE / REMOVE PACEMAKER  2017  . LUMBAR LAMINECTOMY/DECOMPRESSION MICRODISCECTOMY Left 09/18/2016   Procedure: LEFT LUMBAR THREE- LUMBAR FOUR LUMBAR LAMINOTOMY AND MICRODISCECTOMY;  Surgeon: Jovita Gamma, MD;  Location: [place];  Service: Neurosurgery;  Laterality: Left;  LEFT LUMBAR 3- LUMBAR 4 LUMBAR LAMINOTOMY AND MICRODISCECTOMY  . PERMANENT PACEMAKER INSERTION N/A 11/22/2014   Procedure: PERMANENT PACEMAKER INSERTION;  Surgeon: [name], MD;  Location: [facility] CATH LAB;  Service: Cardiovascular;  Laterality: N/A;  . PROSTATE BIOPSY    . TONSILLECTOMY  1942    There were no vitals filed for this visit.      Subjective Assessment - 05/12/17 1452    Pertinent History lumbar laminectomy and discectomy surgery L3-4 performed 09/18/16, Rt ankle fusion.  PACEMAKER   Currently in Pain? No/denies                         [facility] Adult PT Treatment/Exercise - 05/12/17 0001      Lumbar Exercises: Aerobic   Stationary Bike Level 2 x 8 minutes     Lumbar Exercises: Standing   Heel Raises 20 reps   Other Standing Lumbar Exercises standing on black balance pad: diagonals with yellow ball 2x10 bil with  core activation      Knee/Hip Exercises: Stretches   Active Hamstring Stretch Both;3 reps;30 seconds   Piriformis Stretch Both;3 reps;30 seconds     Knee/Hip Exercises: Standing   Walking with Sports Cord 20# back and side to side - 10x each way     Knee/Hip Exercises: Seated   Sit to Sand without UE support;3 sets;10 reps  core activation holding yellow ball                  PT Short Term Goals - 05/12/17 1455      PT SHORT TERM GOAL #1   Title be independent in initial HEP   Status Achieved     PT SHORT TERM GOAL #2   Title verbalize and deomonstrate body mechanics modifications and report abdominal bracing with all activity   Status Achieved     PT SHORT TERM GOAL #3   Title report a 30% reduction in the frequency and intensity of LBP with activity     PT SHORT TERM GOAL #4   Title improve hip flexibility to get leg over bike with 25% increased ease   Status Achieved           PT Long Term Goals - 05/12/17 1456      PT LONG TERM GOAL #4   Title report a 60% reduction in the frequency and intensity of LBP with activity   Status Achieved     PT LONG TERM GOAL #5   Title improve LE flexibility to get leg over bike with 60% increased ease   Status Achieved               Plan - 05/12/17 1457    Clinical Impression Statement Pt denies any pain in the low back over the past week.  Pt is now able to get his leg onto his bike without difficulty.  Pt is more aware of his core and is activating his core with activity.  Pt tolerates core stabilization exercises well today.  Pt has not been riding his bike due to weather and plans to do it this week.  Pt will continue to benefit from skilled PT for strength, endurance, core and flexibility.     Rehab Potential Good   PT Frequency 2x / week   PT Duration 6 weeks   PT Treatment/Interventions ADLs/Self Care Home Management;Moist Heat;Ultrasound;Functional mobility training;Patient/family education;Balance  training;Therapeutic exercise;Therapeutic activities;Manual techniques;Taping;Passive range of motion   PT Next Visit Plan core strength progression, hip flexibility   Consulted and Agree with Plan of Care Patient      Patient will benefit from skilled therapeutic intervention in order to improve the following deficits and impairments:  Decreased range of motion, Pain, Postural dysfunction, Decreased strength, Hypomobility, Impaired flexibility, Improper body mechanics, Decreased activity tolerance, Decreased endurance, Increased muscle spasms  Visit Diagnosis: Muscle weakness (generalized)  Chronic bilateral low back pain without sciatica  Stiffness of left hip, not elsewhere classified  Stiffness of right hip, not elsewhere classified     Problem List Patient Active Problem List   Diagnosis Date Noted  . HNP (herniated nucleus pulposus), lumbar 09/18/2016  . Pacemaker 05/10/2015  . Left ventricular dysfunction 05/10/2015  . Coronary artery disease involving native coronary artery of native heart with angina pectoris with documented spasm ([place])   . Angina pectoris, crescendo ([place]) 05/05/2015  . Unstable angina ([place])   . Moderate mitral insufficiency   . Essential hypertension, benign   . History of GI diverticular bleed   . Atrial tachycardia ([place]) 03/01/2015  . RBBB   . Coronary artery disease involving native coronary artery of native heart without angina pectoris   . Complete heart block ([place]) 11/21/2014  . Essential hypertension 11/09/2014  . Old MI (myocardial infarction) 11/09/2014  . Hyperlipidemia 11/09/2014  . Coronary artery disease due to lipid rich plaque 11/09/2014  . Lower gastrointestinal bleeding 03/07/2014  . Orthostatic hypotension 03/07/2014  . GI bleed 03/07/2014  . Bradycardia 03/07/2014  . Diarrhea 03/07/2014  . Pure hypercholesterolemia 11/23/2013  . Insomnia 11/23/2013  . CAD (coronary artery disease) 05/31/2013  . Lower GI bleed 05/31/2013  .  UPPER RESPIRATORY INFECTION, ACUTE, WITH BRONCHITIS 07/30/2010  . FOOT PAIN, CHRONIC 02/19/2008  . CARCINOMA, PROSTATE 11/13/2007  . HYPERTENSION 11/13/2007  . Allergic rhinitis due to allergen 11/13/2007  . ESOPHAGEAL REFLUX 11/13/2007  . COUGH 11/13/2007  . METATARSALGIA 02/24/2007  . Abnormality of gait 02/24/2007   Sigurd Sos, PT 05/12/17 3:28 PM  [facility] [address], [place], [place], [address] Phone: [phone]   Fax:  [phone]  Name: Andrew Marshall MRN: [id number] Date of Birth: 1930/10/10

## 2017-05-14 ENCOUNTER — Ambulatory Visit: Payer: Medicare Other

## 2017-05-14 DIAGNOSIS — M25652 Stiffness of left hip, not elsewhere classified: Secondary | ICD-10-CM | POA: Diagnosis not present

## 2017-05-14 DIAGNOSIS — M545 Low back pain, unspecified: Secondary | ICD-10-CM

## 2017-05-14 DIAGNOSIS — M6281 Muscle weakness (generalized): Secondary | ICD-10-CM

## 2017-05-14 DIAGNOSIS — M25651 Stiffness of right hip, not elsewhere classified: Secondary | ICD-10-CM

## 2017-05-14 DIAGNOSIS — G8929 Other chronic pain: Secondary | ICD-10-CM | POA: Diagnosis not present

## 2017-05-14 NOTE — Therapy (Addendum)
[facility] [address], [place], [place], [address] Phone: [phone]   Fax:  [phone]  Physical Therapy Treatment  Patient Details  Name: Andrew Marshall MRN: [id number] Date of Birth: 1931/07/11 Referring Provider: Jovita Gamma, MD  Encounter Date: 05/14/2017      PT End of Session - 05/14/17 1521    Visit Number 6   Number of Visits 10   Date for PT Re-Evaluation 06/04/17   PT Start Time 7371   PT Stop Time 1524   PT Time Calculation (min) 39 min   Activity Tolerance Patient tolerated treatment well   Behavior During Therapy [facility] for tasks assessed/performed      Past Medical History:  Diagnosis Date  . Allergic rhinitis   . Arthritis    "thumbs primarily" (05/05/2015)  . Basal cell carcinoma, face    "have had several; cut and burned off"  . CAD (coronary artery disease)    a. 2001 s/p post MI- multiple PCI's to LCX/OM;  b. 2003 DES to OM;  c. 05/2012 MV: postlat infarct w/ mild peri-infarct ischemia. nl EF->Med Rx.  . Complete heart block ([place])    St. Jude pacemaker implanted 11/22/14  . ED (erectile dysfunction)   . Esophageal reflux    "slight"  . Essential hypertension, benign   . Heart murmur   . History of atrial fibrillation   . History of GI diverticular bleed    a. 05/2013 and 02/2014 - taken off of ASA for this reason.  . Metatarsalgia   . MI (myocardial infarction) ([place]) 2001  . Moderate mitral insufficiency    a. 10/2014 Echo: EF 55-60%, Gr1 DD, mild AS, mod MR, sev dil LA, nl RV, mildly dil RA, mild TR, PASP 56mmHg.  . Pain, foot, chronic   . Pes planus   . Presence of permanent cardiac pacemaker   . 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 Prostate cancer ([place]) 2008   S/P "radiation and seed implants"  . Pure hypercholesterolemia   . RLS (restless legs syndrome)     Past Surgical History:  Procedure Laterality Date  . APPENDECTOMY  1956  . BASAL CELL  CARCINOMA EXCISION     "face"  . CARDIAC CATHETERIZATION N/A 05/05/2015   Procedure: Left Heart Cath and Coronary Angiography;  Surgeon: Belva Crome, MD;  Location: [place] [facility];  Service: Cardiovascular;  Laterality: N/A;  . CARDIAC CATHETERIZATION N/A 05/05/2015   Procedure: Coronary Stent Intervention;  Surgeon: Belva Crome, MD;  Location: [place] [facility];  Service: Cardiovascular;  Laterality: N/A;  . CATARACT EXTRACTION W/ INTRAOCULAR LENS IMPLANT Left 12/2014  . COLONOSCOPY W/ POLYPECTOMY    . CORONARY ANGIOPLASTY    . CORONARY ANGIOPLASTY WITH STENT PLACEMENT  04/2000; ? ~ 2002; 01/2002  . ESTERNAL BEAM XRT AND SEEDS FOR PROSTATE CANCER    . EYE SURGERY Right 07/2016  . FOOT ARTHRODESIS, TRIPLE Right 2007  . GANGLION CYST EXCISION Right 1970's   wrist  . INSERT / REPLACE / REMOVE PACEMAKER  2017  . LUMBAR LAMINECTOMY/DECOMPRESSION MICRODISCECTOMY Left 09/18/2016   Procedure: LEFT LUMBAR THREE- LUMBAR FOUR LUMBAR LAMINOTOMY AND MICRODISCECTOMY;  Surgeon: Jovita Gamma, MD;  Location: [place];  Service: Neurosurgery;  Laterality: Left;  LEFT LUMBAR 3- LUMBAR 4 LUMBAR LAMINOTOMY AND MICRODISCECTOMY  . PERMANENT PACEMAKER INSERTION N/A 11/22/2014   Procedure: PERMANENT PACEMAKER INSERTION;  Surgeon: [name], MD;  Location: [facility] CATH LAB;  Service: Cardiovascular;  Laterality: N/A;  . PROSTATE BIOPSY    . TONSILLECTOMY  1942    There were no vitals filed for this visit.      Subjective Assessment - 05/14/17 1531    Subjective I went for a bike ride: 7.5 miles.  No problem with this.     Currently in Pain? No/denies                         [facility] Adult PT Treatment/Exercise - 05/14/17 0001      Lumbar Exercises: Aerobic   Stationary Bike Level 2 x 8 minutes     Lumbar Exercises: Standing   Heel Raises 20 reps   Other Standing Lumbar Exercises standing on black balance pad: diagonals with yellow ball 2x10 bil with core activation      Lumbar Exercises:  Sidelying   Clam 20 reps   Clam Limitations red band added     Knee/Hip Exercises: Stretches   Active Hamstring Stretch Both;3 reps;30 seconds   Piriformis Stretch Both;3 reps;30 seconds     Knee/Hip Exercises: Standing   Walking with Sports Cord 20# back and side to side - 10x each way     Knee/Hip Exercises: Seated   Sit to Sand without UE support;3 sets;10 reps  core activation holding yellow ball                  PT Short Term Goals - 05/12/17 1455      PT SHORT TERM GOAL #1   Title be independent in initial HEP   Status Achieved     PT SHORT TERM GOAL #2   Title verbalize and deomonstrate body mechanics modifications and report abdominal bracing with all activity   Status Achieved     PT SHORT TERM GOAL #3   Title report a 30% reduction in the frequency and intensity of LBP with activity     PT SHORT TERM GOAL #4   Title improve hip flexibility to get leg over bike with 25% increased ease   Status Achieved           PT Long Term Goals - 05/12/17 1456      PT LONG TERM GOAL #4   Title report a 60% reduction in the frequency and intensity of LBP with activity   Status Achieved     PT LONG TERM GOAL #5   Title improve LE flexibility to get leg over bike with 60% increased ease   Status Achieved               Plan - 05/14/17 1456    Clinical Impression Statement Pt denies any pain in the low back over the past week.  Pt is now able to get his bike without difficulty.  Pt was able to ride his bike 7.5 miles without pain or limitation.  Pt is activating his core with activity.  Pt toerated core stabilization exercises well this week.  Pt will continue to benefit from skilled PT for strength, endurance and core flexibility.     PT Frequency 2x / week   PT Duration 6 weeks   PT Treatment/Interventions ADLs/Self Care Home Management;Moist Heat;Ultrasound;Functional mobility training;Patient/family education;Balance training;Therapeutic  exercise;Therapeutic activities;Manual techniques;Taping;Passive range of motion   PT Next Visit Plan core strength progression, hip flexibility   Consulted and Agree with Plan of Care Patient      Patient will benefit from skilled therapeutic intervention in order to improve the following deficits  and impairments:  Decreased range of motion, Pain, Postural dysfunction, Decreased strength, Hypomobility, Impaired flexibility, Improper body mechanics, Decreased activity tolerance, Decreased endurance, Increased muscle spasms  Visit Diagnosis: Muscle weakness (generalized)  Chronic bilateral low back pain without sciatica  Stiffness of left hip, not elsewhere classified  Stiffness of right hip, not elsewhere classified     Problem List Patient Active Problem List   Diagnosis Date Noted  . HNP (herniated nucleus pulposus), lumbar 09/18/2016  . Pacemaker 05/10/2015  . Left ventricular dysfunction 05/10/2015  . Coronary artery disease involving native coronary artery of native heart with angina pectoris with documented spasm ([place])   . Angina pectoris, crescendo ([place]) 05/05/2015  . Unstable angina ([place])   . Moderate mitral insufficiency   . Essential hypertension, benign   . History of GI diverticular bleed   . Atrial tachycardia ([place]) 03/01/2015  . RBBB   . Coronary artery disease involving native coronary artery of native heart without angina pectoris   . Complete heart block ([place]) 11/21/2014  . Essential hypertension 11/09/2014  . Old MI (myocardial infarction) 11/09/2014  . Hyperlipidemia 11/09/2014  . Coronary artery disease due to lipid rich plaque 11/09/2014  . Lower gastrointestinal bleeding 03/07/2014  . Orthostatic hypotension 03/07/2014  . GI bleed 03/07/2014  . Bradycardia 03/07/2014  . Diarrhea 03/07/2014  . Pure hypercholesterolemia 11/23/2013  . Insomnia 11/23/2013  . CAD (coronary artery disease) 05/31/2013  . Lower GI bleed 05/31/2013  . UPPER RESPIRATORY  INFECTION, ACUTE, WITH BRONCHITIS 07/30/2010  . FOOT PAIN, CHRONIC 02/19/2008  . CARCINOMA, PROSTATE 11/13/2007  . HYPERTENSION 11/13/2007  . Allergic rhinitis due to allergen 11/13/2007  . ESOPHAGEAL REFLUX 11/13/2007  . COUGH 11/13/2007  . METATARSALGIA 02/24/2007  . Abnormality of gait 02/24/2007     Sigurd Sos, PT 05/14/17 3:32 PM  [facility] [address], [place], [place], [address] Phone: [phone]   Fax:  [phone]  Name: KYSON KUPPER MRN: [id number] Date of Birth: August 26, 1931

## 2017-05-19 ENCOUNTER — Telehealth: Payer: Self-pay

## 2017-05-19 NOTE — Telephone Encounter (Signed)
Spoke with pt regarding episode from 9/23 at 4:00pm~ pt did not recall anything from that date and time.

## 2017-05-20 ENCOUNTER — Ambulatory Visit: Payer: Medicare Other

## 2017-05-20 DIAGNOSIS — M25652 Stiffness of left hip, not elsewhere classified: Secondary | ICD-10-CM | POA: Diagnosis not present

## 2017-05-20 DIAGNOSIS — M25651 Stiffness of right hip, not elsewhere classified: Secondary | ICD-10-CM | POA: Diagnosis not present

## 2017-05-20 DIAGNOSIS — M545 Low back pain, unspecified: Secondary | ICD-10-CM

## 2017-05-20 DIAGNOSIS — M6281 Muscle weakness (generalized): Secondary | ICD-10-CM

## 2017-05-20 DIAGNOSIS — G8929 Other chronic pain: Secondary | ICD-10-CM | POA: Diagnosis not present

## 2017-05-20 NOTE — Therapy (Signed)
[facility] [address] [place], [place], [address] Phone: [phone]   Fax:  [phone]  Physical Therapy Treatment  Patient Details  Name: [place] MRN: [id number] Date of Birth: 12-Feb-1931 Referring Provider: Jovita Gamma, MD  Encounter Date: 05/20/2017      PT End of Session - 05/20/17 1100    Visit Number 7   Number of Visits 10   Date for PT Re-Evaluation 06/04/17   PT Start Time 4166   PT Stop Time 1059   PT Time Calculation (min) 44 min   Activity Tolerance Patient tolerated treatment well   Behavior During Therapy [facility] for tasks assessed/performed      Past Medical History:  Diagnosis Date  . Allergic rhinitis   . Arthritis    "thumbs primarily" (05/05/2015)  . Basal cell carcinoma, face    "have had several; cut and burned off"  . CAD (coronary artery disease)    a. 2001 s/p post MI- multiple PCI's to LCX/OM;  b. 2003 DES to OM;  c. 05/2012 MV: postlat infarct w/ mild peri-infarct ischemia. nl EF->Med Rx.  . Complete heart block ([place])    St. Jude pacemaker implanted 11/22/14  . ED (erectile dysfunction)   . Esophageal reflux    "slight"  . Essential hypertension, benign   . Heart murmur   . History of atrial fibrillation   . History of GI diverticular bleed    a. 05/2013 and 02/2014 - taken off of ASA for this reason.  . Metatarsalgia   . MI (myocardial infarction) ([place]) 2001  . Moderate mitral insufficiency    a. 10/2014 Echo: EF 55-60%, Gr1 DD, mild AS, mod MR, sev dil LA, nl RV, mildly dil RA, mild TR, PASP 75mmHg.  . Pain, foot, chronic   . Pes planus   . Presence of permanent cardiac pacemaker   . 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 Prostate cancer ([place]) 2008   S/P "radiation and seed implants"  . Pure hypercholesterolemia   . RLS (restless legs syndrome)     Past Surgical History:  Procedure Laterality Date  . APPENDECTOMY  1956  . BASAL CELL  CARCINOMA EXCISION     "face"  . CARDIAC CATHETERIZATION N/A 05/05/2015   Procedure: Left Heart Cath and Coronary Angiography;  Surgeon: Belva Crome, MD;  Location: [place] [facility];  Service: Cardiovascular;  Laterality: N/A;  . CARDIAC CATHETERIZATION N/A 05/05/2015   Procedure: Coronary Stent Intervention;  Surgeon: Belva Crome, MD;  Location: [place] [facility];  Service: Cardiovascular;  Laterality: N/A;  . CATARACT EXTRACTION W/ INTRAOCULAR LENS IMPLANT Left 12/2014  . COLONOSCOPY W/ POLYPECTOMY    . CORONARY ANGIOPLASTY    . CORONARY ANGIOPLASTY WITH STENT PLACEMENT  04/2000; ? ~ 2002; 01/2002  . ESTERNAL BEAM XRT AND SEEDS FOR PROSTATE CANCER    . EYE SURGERY Right 07/2016  . FOOT ARTHRODESIS, TRIPLE Right 2007  . GANGLION CYST EXCISION Right 1970's   wrist  . INSERT / REPLACE / REMOVE PACEMAKER  2017  . LUMBAR LAMINECTOMY/DECOMPRESSION MICRODISCECTOMY Left 09/18/2016   Procedure: LEFT LUMBAR THREE- LUMBAR FOUR LUMBAR LAMINOTOMY AND MICRODISCECTOMY;  Surgeon: Jovita Gamma, MD;  Location: [place];  Service: Neurosurgery;  Laterality: Left;  LEFT LUMBAR 3- LUMBAR 4 LUMBAR LAMINOTOMY AND MICRODISCECTOMY  . PERMANENT PACEMAKER INSERTION N/A 11/22/2014   Procedure: PERMANENT PACEMAKER INSERTION;  Surgeon: [name], MD;  Location: [facility] CATH LAB;  Service: Cardiovascular;  Laterality: N/A;  . PROSTATE BIOPSY    . TONSILLECTOMY  1942    There were no vitals filed for this visit.      Subjective Assessment - 05/20/17 1017    Subjective I havent had pain for 2-3 days.  I have been riding my bike- I did 15 miles over the weekend and had some pain after.  I am comfortable with 8 miles now.     Currently in Pain? No/denies                         [facility] Adult PT Treatment/Exercise - 05/20/17 0001      Lumbar Exercises: Aerobic   Stationary Bike Level 2 x 8 minutes     Lumbar Exercises: Standing   Heel Raises 20 reps   Row Power tower;Both;20 reps  30# x10, 35# x10    Other Standing Lumbar Exercises standing on black balance pad: diagonals with yellow ball 2x10 bil with core activation      Lumbar Exercises: Sidelying   Clam 20 reps   Clam Limitations red band added     Knee/Hip Exercises: Stretches   Active Hamstring Stretch Both;3 reps;30 seconds   Piriformis Stretch Both;3 reps;30 seconds     Knee/Hip Exercises: Standing   Walking with Sports Cord 20# back and side to side - 10x each way     Knee/Hip Exercises: Seated   Sit to Sand without UE support;3 sets;10 reps  core activation holding yellow ball, standing on black pad                  PT Short Term Goals - 05/20/17 1020      PT SHORT TERM GOAL #3   Title report a 30% reduction in the frequency and intensity of LBP with activity   Status Achieved     PT SHORT TERM GOAL #4   Title improve hip flexibility to get leg over bike with 25% increased ease   Status Achieved           PT Long Term Goals - 05/12/17 1456      PT LONG TERM GOAL #4   Title report a 60% reduction in the frequency and intensity of LBP with activity   Status Achieved     PT LONG TERM GOAL #5   Title improve LE flexibility to get leg over bike with 60% increased ease   Status Achieved               Plan - 05/20/17 1020    Clinical Impression Statement Pt has been increasing his mileage with bike riding.  Pt is comfortable with riding ~8 miles, did 15 miles over the weekend and this was too much.  Pt with improved flexibility and able to get his leg over his bike now.  Pt is working to activate his core with all activity.  Pt without pain over the past few days with regular activity.  Pt will continue to benefit from skilled PT for LE strength, flexibility and endurance.     PT Frequency 2x / week   PT Duration 6 weeks   PT Treatment/Interventions ADLs/Self Care Home Management;Moist Heat;Ultrasound;Functional mobility training;Patient/family education;Balance training;Therapeutic  exercise;Therapeutic activities;Manual techniques;Taping;Passive range of motion   PT Next Visit Plan core strength progression, hip flexibility   Consulted and Agree with Plan of Care Patient      Patient will benefit from skilled therapeutic intervention in order to improve the  following deficits and impairments:  Decreased range of motion, Pain, Postural dysfunction, Decreased strength, Hypomobility, Impaired flexibility, Improper body mechanics, Decreased activity tolerance, Decreased endurance, Increased muscle spasms  Visit Diagnosis: Muscle weakness (generalized)  Chronic bilateral low back pain without sciatica  Stiffness of left hip, not elsewhere classified  Stiffness of right hip, not elsewhere classified     Problem List Patient Active Problem List   Diagnosis Date Noted  . HNP (herniated nucleus pulposus), lumbar 09/18/2016  . Pacemaker 05/10/2015  . Left ventricular dysfunction 05/10/2015  . Coronary artery disease involving native coronary artery of native heart with angina pectoris with documented spasm ([place])   . Angina pectoris, crescendo ([place]) 05/05/2015  . Unstable angina ([place])   . Moderate mitral insufficiency   . Essential hypertension, benign   . History of GI diverticular bleed   . Atrial tachycardia ([place]) 03/01/2015  . RBBB   . Coronary artery disease involving native coronary artery of native heart without angina pectoris   . Complete heart block ([place]) 11/21/2014  . Essential hypertension 11/09/2014  . Old MI (myocardial infarction) 11/09/2014  . Hyperlipidemia 11/09/2014  . Coronary artery disease due to lipid rich plaque 11/09/2014  . Lower gastrointestinal bleeding 03/07/2014  . Orthostatic hypotension 03/07/2014  . GI bleed 03/07/2014  . Bradycardia 03/07/2014  . Diarrhea 03/07/2014  . Pure hypercholesterolemia 11/23/2013  . Insomnia 11/23/2013  . CAD (coronary artery disease) 05/31/2013  . Lower GI bleed 05/31/2013  . UPPER RESPIRATORY  INFECTION, ACUTE, WITH BRONCHITIS 07/30/2010  . FOOT PAIN, CHRONIC 02/19/2008  . CARCINOMA, PROSTATE 11/13/2007  . HYPERTENSION 11/13/2007  . Allergic rhinitis due to allergen 11/13/2007  . ESOPHAGEAL REFLUX 11/13/2007  . COUGH 11/13/2007  . METATARSALGIA 02/24/2007  . Abnormality of gait 02/24/2007    Sigurd Sos, PT 05/20/17 11:02 AM  [facility] [address], [place], [place], [address] Phone: [phone]   Fax:  [phone]  Name: Andrew [place] MRN: [id number] Date of Birth: Feb 05, 1931

## 2017-05-27 ENCOUNTER — Ambulatory Visit: Payer: Medicare Other | Attending: Neurosurgery

## 2017-05-27 DIAGNOSIS — M6281 Muscle weakness (generalized): Secondary | ICD-10-CM | POA: Diagnosis not present

## 2017-05-27 DIAGNOSIS — G8929 Other chronic pain: Secondary | ICD-10-CM | POA: Insufficient documentation

## 2017-05-27 DIAGNOSIS — M25652 Stiffness of left hip, not elsewhere classified: Secondary | ICD-10-CM | POA: Diagnosis not present

## 2017-05-27 DIAGNOSIS — M25651 Stiffness of right hip, not elsewhere classified: Secondary | ICD-10-CM | POA: Insufficient documentation

## 2017-05-27 DIAGNOSIS — M545 Low back pain, unspecified: Secondary | ICD-10-CM

## 2017-05-27 NOTE — Therapy (Signed)
[facility] [address] [place], [place], [address] Phone: [phone]   Fax:  [phone]  Physical Therapy Treatment  Patient Details  Name: Andrew [place] MRN: [id number] Date of Birth: Apr 24, 1931 Referring Provider: Jovita Gamma, MD  Encounter Date: 05/27/2017      PT End of Session - 05/27/17 1100    Visit Number 8   Number of Visits 10   Date for PT Re-Evaluation 06/04/17   PT Start Time 1740   PT Stop Time 1058   PT Time Calculation (min) 43 min   Activity Tolerance Patient tolerated treatment well   Behavior During Therapy [facility] for tasks assessed/performed      Past Medical History:  Diagnosis Date  . Allergic rhinitis   . Arthritis    "thumbs primarily" (05/05/2015)  . Basal cell carcinoma, face    "have had several; cut and burned off"  . CAD (coronary artery disease)    a. 2001 s/p post MI- multiple PCI's to LCX/OM;  b. 2003 DES to OM;  c. 05/2012 MV: postlat infarct w/ mild peri-infarct ischemia. nl EF->Med Rx.  . Complete heart block ([place])    St. Jude pacemaker implanted 11/22/14  . ED (erectile dysfunction)   . Esophageal reflux    "slight"  . Essential hypertension, benign   . Heart murmur   . History of atrial fibrillation   . History of GI diverticular bleed    a. 05/2013 and 02/2014 - taken off of ASA for this reason.  . Metatarsalgia   . MI (myocardial infarction) ([place]) 2001  . Moderate mitral insufficiency    a. 10/2014 Echo: EF 55-60%, Gr1 DD, mild AS, mod MR, sev dil LA, nl RV, mildly dil RA, mild TR, PASP 34mmHg.  . Pain, foot, chronic   . Pes planus   . Presence of permanent cardiac pacemaker   . 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 Prostate cancer ([place]) 2008   S/P "radiation and seed implants"  . Pure hypercholesterolemia   . RLS (restless legs syndrome)     Past Surgical History:  Procedure Laterality Date  . APPENDECTOMY  1956  . BASAL CELL  CARCINOMA EXCISION     "face"  . CARDIAC CATHETERIZATION N/A 05/05/2015   Procedure: Left Heart Cath and Coronary Angiography;  Surgeon: Belva Crome, MD;  Location: [place] [facility];  Service: Cardiovascular;  Laterality: N/A;  . CARDIAC CATHETERIZATION N/A 05/05/2015   Procedure: Coronary Stent Intervention;  Surgeon: Belva Crome, MD;  Location: [place] [facility];  Service: Cardiovascular;  Laterality: N/A;  . CATARACT EXTRACTION W/ INTRAOCULAR LENS IMPLANT Left 12/2014  . COLONOSCOPY W/ POLYPECTOMY    . CORONARY ANGIOPLASTY    . CORONARY ANGIOPLASTY WITH STENT PLACEMENT  04/2000; ? ~ 2002; 01/2002  . ESTERNAL BEAM XRT AND SEEDS FOR PROSTATE CANCER    . EYE SURGERY Right 07/2016  . FOOT ARTHRODESIS, TRIPLE Right 2007  . GANGLION CYST EXCISION Right 1970's   wrist  . INSERT / REPLACE / REMOVE PACEMAKER  2017  . LUMBAR LAMINECTOMY/DECOMPRESSION MICRODISCECTOMY Left 09/18/2016   Procedure: LEFT LUMBAR THREE- LUMBAR FOUR LUMBAR LAMINOTOMY AND MICRODISCECTOMY;  Surgeon: Jovita Gamma, MD;  Location: [facility];  Service: Neurosurgery;  Laterality: Left;  LEFT LUMBAR 3- LUMBAR 4 LUMBAR LAMINOTOMY AND MICRODISCECTOMY  . PERMANENT PACEMAKER INSERTION N/A 11/22/2014   Procedure: PERMANENT PACEMAKER INSERTION;  Surgeon: [name], MD;  Location: [facility] CATH LAB;  Service: Cardiovascular;  Laterality: N/A;  . PROSTATE BIOPSY    . TONSILLECTOMY  1942    There were no vitals filed for this visit.      Subjective Assessment - 05/27/17 1030    Subjective I was a little out of breath with walking 1/4 mile the other day.  I haven't been on my bike for 5-6 days.     Currently in Pain? No/denies            [facility] PT Assessment - 05/27/17 0001      Observation/Other Assessments   Focus on Therapeutic Outcomes (FOTO)  17% limitation                     [facility] Adult PT Treatment/Exercise - 05/27/17 0001      Lumbar Exercises: Aerobic   Stationary Bike Level 2 x 8 minutes     Lumbar  Exercises: Standing   Heel Raises 20 reps   Row Power tower;Both;20 reps  35# 2x10   Other Standing Lumbar Exercises standing on black balance pad: diagonals with yellow ball 2x10 bil with core activation      Knee/Hip Exercises: Stretches   Active Hamstring Stretch Both;3 reps;30 seconds   Piriformis Stretch Both;3 reps;30 seconds     Knee/Hip Exercises: Standing   Walking with Sports Cord 20# back and side to side - 10x each way     Knee/Hip Exercises: Seated   Sit to Sand without UE support;3 sets;10 reps  core activation holding yellow ball, standing on black pad                  PT Short Term Goals - 05/20/17 1020      PT SHORT TERM GOAL #3   Title report a 30% reduction in the frequency and intensity of LBP with activity   Status Achieved     PT SHORT TERM GOAL #4   Title improve hip flexibility to get leg over bike with 25% increased ease   Status Achieved           PT Long Term Goals - 05/12/17 1456      PT LONG TERM GOAL #4   Title report a 60% reduction in the frequency and intensity of LBP with activity   Status Achieved     PT LONG TERM GOAL #5   Title improve LE flexibility to get leg over bike with 60% increased ease   Status Achieved               Plan - 05/27/17 1035    Clinical Impression Statement Pt has not been riding his bike this week.  He denies any lumbar pain over the past week.  Pt with improved flexibility and is able to activate core with all activity.  Pt will D/C next session prior to his vacation.     PT Frequency 2x / week   PT Duration 6 weeks   PT Treatment/Interventions ADLs/Self Care Home Management;Moist Heat;Ultrasound;Functional mobility training;Patient/family education;Balance training;Therapeutic exercise;Therapeutic activities;Manual techniques;Taping;Passive range of motion   PT Next Visit Plan D/C PT next session.     Consulted and Agree with Plan of Care Patient      Patient will benefit from  skilled therapeutic intervention in order to improve the following deficits and impairments:  Decreased range of motion, Pain, Postural dysfunction, Decreased strength, Hypomobility, Impaired flexibility, Improper body mechanics, Decreased activity tolerance, Decreased endurance, Increased muscle spasms  Visit Diagnosis: Muscle weakness (generalized)  Chronic bilateral low  back pain without sciatica  Stiffness of left hip, not elsewhere classified  Stiffness of right hip, not elsewhere classified     Problem List Patient Active Problem List   Diagnosis Date Noted  . HNP (herniated nucleus pulposus), lumbar 09/18/2016  . Pacemaker 05/10/2015  . Left ventricular dysfunction 05/10/2015  . Coronary artery disease involving native coronary artery of native heart with angina pectoris with documented spasm ([place])   . Angina pectoris, crescendo ([place]) 05/05/2015  . Unstable angina ([place])   . Moderate mitral insufficiency   . Essential hypertension, benign   . History of GI diverticular bleed   . Atrial tachycardia ([place]) 03/01/2015  . RBBB   . Coronary artery disease involving native coronary artery of native heart without angina pectoris   . Complete heart block ([place]) 11/21/2014  . Essential hypertension 11/09/2014  . Old MI (myocardial infarction) 11/09/2014  . Hyperlipidemia 11/09/2014  . Coronary artery disease due to lipid rich plaque 11/09/2014  . Lower gastrointestinal bleeding 03/07/2014  . Orthostatic hypotension 03/07/2014  . GI bleed 03/07/2014  . Bradycardia 03/07/2014  . Diarrhea 03/07/2014  . Pure hypercholesterolemia 11/23/2013  . Insomnia 11/23/2013  . CAD (coronary artery disease) 05/31/2013  . Lower GI bleed 05/31/2013  . UPPER RESPIRATORY INFECTION, ACUTE, WITH BRONCHITIS 07/30/2010  . FOOT PAIN, CHRONIC 02/19/2008  . CARCINOMA, PROSTATE 11/13/2007  . HYPERTENSION 11/13/2007  . Allergic rhinitis due to allergen 11/13/2007  . ESOPHAGEAL REFLUX 11/13/2007  .  COUGH 11/13/2007  . METATARSALGIA 02/24/2007  . Abnormality of gait 02/24/2007    Sigurd Sos, PT 05/27/17 11:01 AM  [facility] [address] [place], [place], [address] Phone: [phone]   Fax:  [phone]  Name: TYRIN HERBERS MRN: [id number] Date of Birth: 1931/01/01

## 2017-05-29 ENCOUNTER — Ambulatory Visit: Payer: Medicare Other

## 2017-05-29 DIAGNOSIS — M545 Low back pain, unspecified: Secondary | ICD-10-CM

## 2017-05-29 DIAGNOSIS — M6281 Muscle weakness (generalized): Secondary | ICD-10-CM | POA: Diagnosis not present

## 2017-05-29 DIAGNOSIS — G8929 Other chronic pain: Secondary | ICD-10-CM

## 2017-05-29 DIAGNOSIS — M25651 Stiffness of right hip, not elsewhere classified: Secondary | ICD-10-CM | POA: Diagnosis not present

## 2017-05-29 DIAGNOSIS — M25652 Stiffness of left hip, not elsewhere classified: Secondary | ICD-10-CM | POA: Diagnosis not present

## 2017-05-29 NOTE — Therapy (Signed)
[facility] [address], [place], [place], [address] Phone: [phone]   Fax:  [phone]  Physical Therapy Treatment  Patient Details  Name: Andrew Marshall MRN: [id number] Date of Birth: September 20, 1930 Referring Provider: Jovita Gamma, MD  Encounter Date: 05/29/2017      PT End of Session - 05/29/17 1057    Visit Number 9   PT Start Time 1015   PT Stop Time 1055   PT Time Calculation (min) 40 min   Activity Tolerance Patient tolerated treatment well   Behavior During Therapy [facility] for tasks assessed/performed      Past Medical History:  Diagnosis Date  . Allergic rhinitis   . Arthritis    "thumbs primarily" (05/05/2015)  . Basal cell carcinoma, face    "have had several; cut and burned off"  . CAD (coronary artery disease)    a. 2001 s/p post MI- multiple PCI's to LCX/OM;  b. 2003 DES to OM;  c. 05/2012 MV: postlat infarct w/ mild peri-infarct ischemia. nl EF->Med Rx.  . Complete heart block ([place])    St. Jude pacemaker implanted 11/22/14  . ED (erectile dysfunction)   . Esophageal reflux    "slight"  . Essential hypertension, benign   . Heart murmur   . History of atrial fibrillation   . History of GI diverticular bleed    a. 05/2013 and 02/2014 - taken off of ASA for this reason.  . Metatarsalgia   . MI (myocardial infarction) ([place]) 2001  . Moderate mitral insufficiency    a. 10/2014 Echo: EF 55-60%, Gr1 DD, mild AS, mod MR, sev dil LA, nl RV, mildly dil RA, mild TR, PASP 23mHg.  . Pain, foot, chronic   . Pes planus   . Presence of permanent cardiac pacemaker   . 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 Prostate cancer [place]   S/P "radiation and seed implants"  . Pure hypercholesterolemia   . RLS (restless legs syndrome)     Past Surgical History:  Procedure Laterality Date  . APPENDECTOMY  1956  . BASAL CELL CARCINOMA EXCISION     "face"  . CARDIAC CATHETERIZATION N/A  05/05/2015   Procedure: Left Heart Cath and Coronary Angiography;  Surgeon: HBelva Crome MD;  [facility];  Service: Cardiovascular;  Laterality: N/A;  . CARDIAC CATHETERIZATION N/A 05/05/2015   Procedure: Coronary Stent Intervention;  Surgeon: HBelva Crome MD;  [facility];  Service: Cardiovascular;  Laterality: N/A;  . CATARACT EXTRACTION W/ INTRAOCULAR LENS IMPLANT Left 12/2014  . COLONOSCOPY W/ POLYPECTOMY    . CORONARY ANGIOPLASTY    . CORONARY ANGIOPLASTY WITH STENT PLACEMENT  04/2000; ? ~ 2002; 01/2002  . ESTERNAL BEAM XRT AND SEEDS FOR PROSTATE CANCER    . EYE SURGERY Right 07/2016  . FOOT ARTHRODESIS, TRIPLE Right 2007  . GANGLION CYST EXCISION Right 1970's   wrist  . INSERT / REPLACE / REMOVE PACEMAKER  2017  . LUMBAR LAMINECTOMY/DECOMPRESSION MICRODISCECTOMY Left 09/18/2016   Procedure: LEFT LUMBAR THREE- LUMBAR FOUR LUMBAR LAMINOTOMY AND MICRODISCECTOMY;  Surgeon: RJovita Gamma MD;  [place] [facility]  Service: Neurosurgery;  Laterality: Left;  LEFT LUMBAR 3- LUMBAR 4 LUMBAR LAMINOTOMY AND MICRODISCECTOMY  . PERMANENT PACEMAKER INSERTION N/A 11/22/2014   Procedure: PERMANENT PACEMAKER INSERTION;  Surgeon: JThompson Grayer MD;  [facility];  Service: Cardiovascular;  Laterality: N/A;  . PROSTATE BIOPSY    . TONSILLECTOMY  1942    There were no vitals filed for this visit.      Subjective Assessment - 05/29/17 1018    Subjective Ready for D/C to HEP   Currently in Pain? No/denies            [facility] PT Assessment - 05/29/17 0001      Assessment   Medical Diagnosis s/p lumbar discectomy   Onset Date/Surgical Date 02/24/17     Prior Function   Level of Independence Independent   Vocation Retired   Leisure bike riding     Cognition   Overall Cognitive Status Within Functional Limits for tasks assessed     Observation/Other Assessments   Focus on Therapeutic Outcomes (FOTO)  17% limitation                      [facility] Adult PT Treatment/Exercise - 05/29/17 0001      Lumbar Exercises: Aerobic   Stationary Bike Level 2 x 8 minutes     Lumbar Exercises: Standing   Heel Raises 20 reps   Row Power tower;Both;20 reps  35# 2x10   Other Standing Lumbar Exercises standing on black balance pad: diagonals with yellow ball 2x10 bil with core activation      Knee/Hip Exercises: Stretches   Active Hamstring Stretch Both;3 reps;30 seconds     Knee/Hip Exercises: Standing   Walking with Sports Cord 20# back and side to side - 10x each way     Knee/Hip Exercises: Seated   Sit to Sand without UE support;3 sets;10 reps  core activation holding yellow ball, standing on black pad                  PT Short Term Goals - 05/20/17 1020      PT SHORT TERM GOAL #3   Title report a 30% reduction in the frequency and intensity of LBP with activity   Status Achieved     PT SHORT TERM GOAL #4   Title improve hip flexibility to get leg over bike with 25% increased ease   Status Achieved           PT Long Term Goals - 05/29/17 1020      PT LONG TERM GOAL #1   Title be independent in advanced HEP   Status Achieved     PT LONG TERM GOAL #2   Title reduce FOTO to < or = to 32% limitation   Status Achieved     PT LONG TERM GOAL #3   Title return to riding bike for regular distance without increased LBP   Baseline riding 18 miles    Status Partially Met     PT LONG TERM GOAL #4   Title report a 60% reduction in the frequency and intensity of LBP with activity   Status Achieved     PT LONG TERM GOAL #5   Title improve LE flexibility to get leg over bike with 60% increased ease   Status Achieved               Plan - 05/29/17 1024    Clinical Impression Statement Pt is ready for D/C to HEP.  Pt has comprehensive HEP       Patient will benefit from skilled therapeutic intervention in order to improve the following deficits and impairments:     Visit Diagnosis: Muscle weakness  (generalized)  Chronic bilateral low back pain without sciatica  Stiffness of left hip, not elsewhere classified  Stiffness of right hip, not elsewhere classified       G-Codes - 06-20-17 1035    Functional Assessment Tool Used (Outpatient Only) FOTO: 17% limitation   Functional Limitation Other PT primary   Other PT Primary Goal Status (C1448) At least 20 percent but less than 40 percent impaired, limited or restricted   Other PT Primary Discharge Status (J8563) At least 1 percent but less than 20 percent impaired, limited or restricted      Problem List Patient Active Problem List   Diagnosis Date Noted  . HNP (herniated nucleus pulposus), lumbar 09/18/2016  . Pacemaker 05/10/2015  . Left ventricular dysfunction 05/10/2015  . Coronary artery disease involving native coronary artery of native heart with angina pectoris with documented spasm ([place])   . Angina pectoris, crescendo ([place]) 05/05/2015  . Unstable angina ([place])   . Moderate mitral insufficiency   . Essential hypertension, benign   . History of GI diverticular bleed   . Atrial tachycardia ([place]) 03/01/2015  . RBBB   . Coronary artery disease involving native coronary artery of native heart without angina pectoris   . Complete heart block ([place]) 11/21/2014  . Essential hypertension 11/09/2014  . Old MI (myocardial infarction) 11/09/2014  . Hyperlipidemia 11/09/2014  . Coronary artery disease due to lipid rich plaque 11/09/2014  . Lower gastrointestinal bleeding 03/07/2014  . Orthostatic hypotension 03/07/2014  . GI bleed 03/07/2014  . Bradycardia 03/07/2014  . Diarrhea 03/07/2014  . Pure hypercholesterolemia 11/23/2013  . Insomnia 11/23/2013  . CAD (coronary artery disease) 05/31/2013  . Lower GI bleed 05/31/2013  . UPPER RESPIRATORY INFECTION, ACUTE, WITH BRONCHITIS 07/30/2010  . FOOT PAIN, CHRONIC 02/19/2008  . CARCINOMA, PROSTATE 11/13/2007  . HYPERTENSION 11/13/2007  . Allergic rhinitis due to allergen  11/13/2007  . ESOPHAGEAL REFLUX 11/13/2007  . COUGH 11/13/2007  . METATARSALGIA 02/24/2007  . Abnormality of gait 02/24/2007   PHYSICAL THERAPY DISCHARGE SUMMARY  Visits from Start of Care: 9  Current functional level related to goals / functional outcomes: See above for current status.  Pt has HEP in place for core strength and flexibility.     Remaining deficits: Pt has not returned to riding his usual number of miles on his bike.  He is gradually building this.     Education / Equipment: HEP, posture/body mechanics Plan: Patient agrees to discharge.  Patient goals were partially met. Patient is being discharged due to being pleased with the current functional level.  ?????         Sigurd Sos, PT 20-Jun-2017 11:00 AM  [facility] [address] [place], [place], [address] Phone: [phone]   Fax:  [phone]  Name: NAVEN GIAMBALVO MRN: [id number] Date of Birth: Sep 26, 1930

## 2017-05-30 ENCOUNTER — Other Ambulatory Visit: Payer: Self-pay | Admitting: Internal Medicine

## 2017-06-02 DIAGNOSIS — Z23 Encounter for immunization: Secondary | ICD-10-CM | POA: Diagnosis not present

## 2017-07-06 DIAGNOSIS — M25552 Pain in left hip: Secondary | ICD-10-CM | POA: Diagnosis not present

## 2017-07-11 DIAGNOSIS — S7012XA Contusion of left thigh, initial encounter: Secondary | ICD-10-CM | POA: Diagnosis not present

## 2017-08-04 ENCOUNTER — Ambulatory Visit (INDEPENDENT_AMBULATORY_CARE_PROVIDER_SITE_OTHER): Payer: Medicare Other | Admitting: *Deleted

## 2017-08-04 DIAGNOSIS — I495 Sick sinus syndrome: Secondary | ICD-10-CM | POA: Diagnosis not present

## 2017-08-04 NOTE — Progress Notes (Signed)
Remote pacemaker transmission.   

## 2017-08-08 ENCOUNTER — Encounter: Payer: Self-pay | Admitting: Cardiology

## 2017-08-08 DIAGNOSIS — M5136 Other intervertebral disc degeneration, lumbar region: Secondary | ICD-10-CM | POA: Diagnosis not present

## 2017-08-08 DIAGNOSIS — Z9889 Other specified postprocedural states: Secondary | ICD-10-CM | POA: Diagnosis not present

## 2017-08-08 DIAGNOSIS — Q762 Congenital spondylolisthesis: Secondary | ICD-10-CM | POA: Diagnosis not present

## 2017-08-11 DIAGNOSIS — I209 Angina pectoris, unspecified: Secondary | ICD-10-CM | POA: Diagnosis not present

## 2017-08-11 DIAGNOSIS — I119 Hypertensive heart disease without heart failure: Secondary | ICD-10-CM | POA: Diagnosis not present

## 2017-08-11 DIAGNOSIS — I252 Old myocardial infarction: Secondary | ICD-10-CM | POA: Diagnosis not present

## 2017-08-11 DIAGNOSIS — E7849 Other hyperlipidemia: Secondary | ICD-10-CM | POA: Diagnosis not present

## 2017-08-11 DIAGNOSIS — I2510 Atherosclerotic heart disease of native coronary artery without angina pectoris: Secondary | ICD-10-CM | POA: Diagnosis not present

## 2017-08-11 DIAGNOSIS — Z950 Presence of cardiac pacemaker: Secondary | ICD-10-CM | POA: Diagnosis not present

## 2017-08-11 DIAGNOSIS — Z955 Presence of coronary angioplasty implant and graft: Secondary | ICD-10-CM | POA: Diagnosis not present

## 2017-08-12 ENCOUNTER — Other Ambulatory Visit: Payer: Self-pay | Admitting: Physician Assistant

## 2017-08-15 LAB — CUP PACEART REMOTE DEVICE CHECK
Battery Remaining Longevity: 115 mo
Battery Remaining Percentage: 95.5 %
Battery Voltage: 2.99 V
Brady Statistic AP VP Percent: 95 %
Brady Statistic AP VS Percent: 1 %
Brady Statistic AS VP Percent: 3.6 %
Brady Statistic AS VS Percent: 1 %
Brady Statistic RA Percent Paced: 95 %
Brady Statistic RV Percent Paced: 99 %
Date Time Interrogation Session: 20181210091833
Implantable Lead Implant Date: 20160329
Implantable Lead Implant Date: 20160329
Implantable Lead Location: 753859
Implantable Lead Location: 753860
Implantable Lead Model: 1948
Implantable Pulse Generator Implant Date: 20160329
Lead Channel Impedance Value: 390 Ohm
Lead Channel Impedance Value: 660 Ohm
Lead Channel Pacing Threshold Amplitude: 0.75 V
Lead Channel Pacing Threshold Amplitude: 0.75 V
Lead Channel Pacing Threshold Pulse Width: 0.4 ms
Lead Channel Pacing Threshold Pulse Width: 0.4 ms
Lead Channel Sensing Intrinsic Amplitude: 12 mV
Lead Channel Sensing Intrinsic Amplitude: 3.1 mV
Lead Channel Setting Pacing Amplitude: 1 V
Lead Channel Setting Pacing Amplitude: 2.5 V
Lead Channel Setting Pacing Pulse Width: 0.4 ms
Lead Channel Setting Sensing Sensitivity: 2.5 mV
Pulse Gen Model: 2240
Pulse Gen Serial Number: 7732903

## 2017-09-02 DIAGNOSIS — R448 Other symptoms and signs involving general sensations and perceptions: Secondary | ICD-10-CM | POA: Diagnosis not present

## 2017-09-02 DIAGNOSIS — H04123 Dry eye syndrome of bilateral lacrimal glands: Secondary | ICD-10-CM | POA: Diagnosis not present

## 2017-09-02 DIAGNOSIS — H5712 Ocular pain, left eye: Secondary | ICD-10-CM | POA: Diagnosis not present

## 2017-09-02 DIAGNOSIS — R0609 Other forms of dyspnea: Secondary | ICD-10-CM | POA: Diagnosis not present

## 2017-09-02 DIAGNOSIS — I2510 Atherosclerotic heart disease of native coronary artery without angina pectoris: Secondary | ICD-10-CM | POA: Diagnosis not present

## 2017-09-03 ENCOUNTER — Other Ambulatory Visit: Payer: Self-pay | Admitting: Internal Medicine

## 2017-09-03 ENCOUNTER — Ambulatory Visit
Admission: RE | Admit: 2017-09-03 | Discharge: 2017-09-03 | Disposition: A | Discharge status: Home / Self Care | Payer: Medicare Other | Source: Ambulatory Visit | Attending: Internal Medicine | Admitting: Internal Medicine

## 2017-09-03 DIAGNOSIS — R0600 Dyspnea, unspecified: Secondary | ICD-10-CM

## 2017-09-03 DIAGNOSIS — R0609 Other forms of dyspnea: Principal | ICD-10-CM

## 2017-09-03 DIAGNOSIS — R0602 Shortness of breath: Secondary | ICD-10-CM | POA: Diagnosis not present

## 2017-09-05 DIAGNOSIS — Z955 Presence of coronary angioplasty implant and graft: Secondary | ICD-10-CM | POA: Diagnosis not present

## 2017-09-05 DIAGNOSIS — R0602 Shortness of breath: Secondary | ICD-10-CM | POA: Diagnosis not present

## 2017-09-05 DIAGNOSIS — E7849 Other hyperlipidemia: Secondary | ICD-10-CM | POA: Diagnosis not present

## 2017-09-05 DIAGNOSIS — I4891 Unspecified atrial fibrillation: Secondary | ICD-10-CM | POA: Diagnosis not present

## 2017-09-05 DIAGNOSIS — I252 Old myocardial infarction: Secondary | ICD-10-CM | POA: Diagnosis not present

## 2017-09-05 DIAGNOSIS — I10 Essential (primary) hypertension: Secondary | ICD-10-CM | POA: Diagnosis not present

## 2017-09-05 DIAGNOSIS — I25119 Atherosclerotic heart disease of native coronary artery with unspecified angina pectoris: Secondary | ICD-10-CM | POA: Diagnosis not present

## 2017-09-05 DIAGNOSIS — M189 Osteoarthritis of first carpometacarpal joint, unspecified: Secondary | ICD-10-CM | POA: Diagnosis not present

## 2017-09-05 DIAGNOSIS — I119 Hypertensive heart disease without heart failure: Secondary | ICD-10-CM | POA: Diagnosis not present

## 2017-09-05 DIAGNOSIS — I209 Angina pectoris, unspecified: Secondary | ICD-10-CM | POA: Diagnosis not present

## 2017-09-05 DIAGNOSIS — I2510 Atherosclerotic heart disease of native coronary artery without angina pectoris: Secondary | ICD-10-CM | POA: Diagnosis not present

## 2017-09-05 DIAGNOSIS — Z950 Presence of cardiac pacemaker: Secondary | ICD-10-CM | POA: Diagnosis not present

## 2017-09-10 ENCOUNTER — Ambulatory Visit (INDEPENDENT_AMBULATORY_CARE_PROVIDER_SITE_OTHER): Payer: Medicare Other | Admitting: Internal Medicine

## 2017-09-10 ENCOUNTER — Encounter (INDEPENDENT_AMBULATORY_CARE_PROVIDER_SITE_OTHER): Payer: Self-pay

## 2017-09-10 ENCOUNTER — Encounter: Payer: Self-pay | Admitting: Internal Medicine

## 2017-09-10 VITALS — BP 132/72 | HR 60 | Ht 69.5 in | Wt 176.0 lb

## 2017-09-10 DIAGNOSIS — I10 Essential (primary) hypertension: Secondary | ICD-10-CM | POA: Diagnosis not present

## 2017-09-10 DIAGNOSIS — R0602 Shortness of breath: Secondary | ICD-10-CM | POA: Diagnosis not present

## 2017-09-10 DIAGNOSIS — I495 Sick sinus syndrome: Secondary | ICD-10-CM | POA: Diagnosis not present

## 2017-09-10 DIAGNOSIS — I442 Atrioventricular block, complete: Secondary | ICD-10-CM

## 2017-09-10 DIAGNOSIS — I480 Paroxysmal atrial fibrillation: Secondary | ICD-10-CM

## 2017-09-10 NOTE — Progress Notes (Signed)
PCP: Seward Carol, MD Primary Cardiologist:  Dr Wynonia Lawman Primary EP:  Dr Dema Severin Andrew Marshall is a 84 y.o. male who presents today for electrophysiology followup.  He recently presented to Dr Thurman Coyer office complaining of worsening SOB with activity.  He has been much less active however over the past few months and is not certain that he isn't just deconditioned.  He has been observed to have episodes of PMT on his recent remote.  Dr Wynonia Lawman has asked that we assess to see if this could be the cause of his symptoms.  Today, he denies symptoms of palpitations, chest pain, shortness of breath at rest,  lower extremity edema, dizziness, presyncope, or syncope.  The patient is otherwise without complaint today.   Past Medical History:  Diagnosis Date  . Allergic rhinitis   . Arthritis    "thumbs primarily" (05/05/2015)  . Basal cell carcinoma, face    "have had several; cut and burned off"  . CAD (coronary artery disease)    a. 2001 s/p post MI- multiple PCI's to LCX/OM;  b. 2003 DES to OM;  c. 05/2012 MV: postlat infarct w/ mild peri-infarct ischemia. nl EF->Med Rx.  . Complete heart block ([place])    St. Jude pacemaker implanted 11/22/14  . ED (erectile dysfunction)   . Esophageal reflux    "slight"  . Essential hypertension, benign   . Heart murmur   . History of atrial fibrillation   . History of GI diverticular bleed    a. 05/2013 and 02/2014 - taken off of ASA for this reason.  . Metatarsalgia   . MI (myocardial infarction) ([place]) 2001  . Moderate mitral insufficiency    a. 10/2014 Echo: EF 55-60%, Gr1 DD, mild AS, mod MR, sev dil LA, nl RV, mildly dil RA, mild TR, PASP 30mmHg.  . Pain, foot, chronic   . Pes planus   . Presence of permanent cardiac pacemaker   . 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 Prostate cancer ([place]) 2008   S/P "radiation and seed implants"  . Pure hypercholesterolemia   . RLS (restless legs syndrome)    Past Surgical History:    Procedure Laterality Date  . APPENDECTOMY  1956  . BASAL CELL CARCINOMA EXCISION     "face"  . CARDIAC CATHETERIZATION N/A 05/05/2015   Procedure: Left Heart Cath and Coronary Angiography;  Surgeon: Belva Crome, MD;  Location: [place] [facility];  Service: Cardiovascular;  Laterality: N/A;  . CARDIAC CATHETERIZATION N/A 05/05/2015   Procedure: Coronary Stent Intervention;  Surgeon: Belva Crome, MD;  Location: [place] [facility];  Service: Cardiovascular;  Laterality: N/A;  . CATARACT EXTRACTION W/ INTRAOCULAR LENS IMPLANT Left 12/2014  . COLONOSCOPY W/ POLYPECTOMY    . CORONARY ANGIOPLASTY    . CORONARY ANGIOPLASTY WITH STENT PLACEMENT  04/2000; ? ~ 2002; 01/2002  . ESTERNAL BEAM XRT AND SEEDS FOR PROSTATE CANCER    . EYE SURGERY Right 07/2016  . FOOT ARTHRODESIS, TRIPLE Right 2007  . GANGLION CYST EXCISION Right 1970's   wrist  . INSERT / REPLACE / REMOVE PACEMAKER  2017  . LUMBAR LAMINECTOMY/DECOMPRESSION MICRODISCECTOMY Left 09/18/2016   Procedure: LEFT LUMBAR THREE- LUMBAR FOUR LUMBAR LAMINOTOMY AND MICRODISCECTOMY;  Surgeon: Jovita Gamma, MD;  Location: [place];  Service: Neurosurgery;  Laterality: Left;  LEFT LUMBAR 3- LUMBAR 4 LUMBAR LAMINOTOMY AND MICRODISCECTOMY  . PERMANENT PACEMAKER INSERTION N/A 11/22/2014   Procedure: PERMANENT PACEMAKER INSERTION;  Surgeon: Jeneen Rinks  Kristyna Bradstreet, MD;  Location: [place] [facility];  Service: Cardiovascular;  Laterality: N/A;  . PROSTATE BIOPSY    . TONSILLECTOMY  1942    ROS- all systems are reviewed and negative except as per HPI above  Current Outpatient Medications  Medication Sig Dispense Refill  . aspirin EC 81 MG tablet Take 1 tablet (81 mg total) by mouth daily.    Marland Kitchen atorvastatin (LIPITOR) 40 MG tablet TAKE 1 TABLET (40 MG TOTAL) BY MOUTH DAILY. 90 tablet 2  . carvedilol (COREG) 12.5 MG tablet Take 12.5 mg by mouth 2 (two) times daily with a meal.    . cetirizine (ZYRTEC) 10 MG tablet Take 10 mg by mouth daily as needed for allergies.     Marland Kitchen  clopidogrel (PLAVIX) 75 MG tablet TAKE ONE TABLET BY MOUTH DAILY 90 tablet 1  . nitroGLYCERIN (NITROSTAT) 0.4 MG SL tablet Place 1 tablet (0.4 mg total) under the tongue every 5 (five) minutes as needed for chest pain (MAX 3 TABLETS). 25 tablet 3  . sildenafil (VIAGRA) 100 MG tablet Take 100 mg by mouth daily as needed for erectile dysfunction.     . valsartan (DIOVAN) 320 MG tablet Take 320 mg by mouth daily.     No current facility-administered medications for this visit.     Physical Exam: Vitals:   09/10/17 1639  BP: 132/72  Pulse: 60  Weight: 176 lb (79.8 kg)  Height: 5' 9.5" (1.765 m)    GEN- The patient is well appearing, alert and oriented x 3 today.   Head- normocephalic, atraumatic Eyes-  Sclera clear, conjunctiva pink Ears- hearing intact Oropharynx- clear Lungs- Clear to ausculation bilaterally, normal work of breathing Chest- pacemaker pocket is well healed Heart- Regular rate and rhythm, no murmurs, rubs or gallops, PMI not laterally displaced GI- soft, NT, ND, + BS Extremities- no clubbing, cyanosis, or edema  Pacemaker interrogation- reviewed in detail today,  See PACEART report  ekg tracing ordered today is personally reviewed and shows AV paced rhythm  Assessment and Plan:  1. Symptomatic complete heart block Normal pacemaker function He paces A and V > 90%.  He is device dependant today.  He has had episodes of PMT, including in the office today.  Of note, he was asymptomatic with his in office PMT today.  His VA time during PMT was 380 msec.  Programming to avoid PMT could therefore be a challenge with a DDD pacing mode.  I have therefore reprogrammed DDIR today.  This should eliminate PMT.  I will have him return in 4 weeks for reassessment.  Of note, his histogram is not consistent with a very large PMT burden. I am not convinced that this was the cause of his symptoms. See Claudia Desanctis Art report  2. SOB As above He has echo and myoview planned with Dr Wynonia Lawman  within the next few days which I think is appropriate.  Return in 4 weeks to reassess SOB with programming changes. If echo and myoview are reassuring, regular exercise is encouraged.  3. NSVT Observed on recent remote Asymptomatic Echo and myoview planned as above  4. afib Very low burden Would avoid anticoagulation currently  5. HTN Stable No change required today  Return in 4 weeks Follow-up with Dr Wynonia Lawman as scheduled  [name] MD, [facility] 09/10/2017 4:43 PM

## 2017-09-10 NOTE — Patient Instructions (Signed)
Medication Instructions:  Your physician recommends that you continue on your current medications as directed. Please refer to the Current Medication list given to you today.   Labwork: None ordered   Testing/Procedures: None ordered   Follow-Up: Your physician recommends that you schedule a follow-up appointment in: 4 weeks with Dr Allred   Any Other Special Instructions Will Be Listed Below (If Applicable).     If you need a refill on your cardiac medications before your next appointment, please call your pharmacy.   

## 2017-09-11 LAB — CUP PACEART INCLINIC DEVICE CHECK
Battery Remaining Longevity: 111 mo
Battery Voltage: 2.99 V
Brady Statistic RA Percent Paced: 94 %
Brady Statistic RV Percent Paced: 99 %
Date Time Interrogation Session: 20190116223655
Implantable Lead Implant Date: 20160329
Implantable Lead Implant Date: 20160329
Implantable Lead Location: 753859
Implantable Lead Location: 753860
Implantable Lead Model: 1948
Implantable Pulse Generator Implant Date: 20160329
Lead Channel Impedance Value: 425 Ohm
Lead Channel Impedance Value: 687.5 Ohm
Lead Channel Pacing Threshold Amplitude: 0.75 V
Lead Channel Pacing Threshold Amplitude: 0.75 V
Lead Channel Pacing Threshold Amplitude: 1.25 V
Lead Channel Pacing Threshold Amplitude: 1.25 V
Lead Channel Pacing Threshold Pulse Width: 0.4 ms
Lead Channel Pacing Threshold Pulse Width: 0.4 ms
Lead Channel Pacing Threshold Pulse Width: 0.4 ms
Lead Channel Pacing Threshold Pulse Width: 0.4 ms
Lead Channel Sensing Intrinsic Amplitude: 12 mV
Lead Channel Sensing Intrinsic Amplitude: 2.2 mV
Lead Channel Setting Pacing Amplitude: 1 V
Lead Channel Setting Pacing Amplitude: 2.5 V
Lead Channel Setting Pacing Pulse Width: 0.4 ms
Lead Channel Setting Sensing Sensitivity: 2.5 mV
Pulse Gen Model: 2240
Pulse Gen Serial Number: 7732903

## 2017-09-11 NOTE — Addendum Note (Signed)
Addended by: Marlis Edelson C on: 09/11/2017 12:17 PM   Modules accepted: Orders

## 2017-09-15 DIAGNOSIS — R0609 Other forms of dyspnea: Secondary | ICD-10-CM | POA: Diagnosis not present

## 2017-09-18 DIAGNOSIS — E7849 Other hyperlipidemia: Secondary | ICD-10-CM | POA: Diagnosis not present

## 2017-09-18 DIAGNOSIS — Z950 Presence of cardiac pacemaker: Secondary | ICD-10-CM | POA: Diagnosis not present

## 2017-09-18 DIAGNOSIS — R0602 Shortness of breath: Secondary | ICD-10-CM | POA: Diagnosis not present

## 2017-09-18 DIAGNOSIS — Z01810 Encounter for preprocedural cardiovascular examination: Secondary | ICD-10-CM | POA: Diagnosis not present

## 2017-09-18 DIAGNOSIS — R0609 Other forms of dyspnea: Secondary | ICD-10-CM | POA: Diagnosis not present

## 2017-09-18 DIAGNOSIS — I119 Hypertensive heart disease without heart failure: Secondary | ICD-10-CM | POA: Diagnosis not present

## 2017-09-18 DIAGNOSIS — I2510 Atherosclerotic heart disease of native coronary artery without angina pectoris: Secondary | ICD-10-CM | POA: Diagnosis not present

## 2017-09-18 DIAGNOSIS — I252 Old myocardial infarction: Secondary | ICD-10-CM | POA: Diagnosis not present

## 2017-09-18 NOTE — H&P (Signed)
Andrew Marshall    Date of visit:  09/18/2017 DOB:  May 16, 1931    Age:  84 yrs. Medical record number:  [id number]     Account number:  [id number] Primary Care Provider: POLITE MD, RONALD ____________________________ CURRENT DIAGNOSES  1. Dyspnea  2. Encounter for preprocedural cardiovascular examination  3. CAD Native without angina  4. Old myocardial infarction  5. Hyperlipidemia  6. Hypertensive heart disease without heart failure  7. Presence of cardiac pacemaker ____________________________ ALLERGIES  Ambien, Sleep walking  Erythromycin Base, Stomach ache ____________________________ MEDICATIONS  1. Aspirin Childrens 81 mg chewable tablet, 1 p.o. daily  2. atorvastatin 40 mg tablet, 1 p.o. daily  3. carvedilol 12.5 mg tablet, BID  4. Plavix 75 mg tablet, 1 p.o. daily  5. valsartan 320 mg tablet, 1 p.o. daily  6. Zyrtec 10 mg capsule, PRN ____________________________ HISTORY OF PRESENT ILLNESS Patient is brought in to the hospital for cardiac catheterization. He switched care to me a couple of years ago and had a previous history of an acute circumflex occlusion 2001 that was treated with multiple stents and later had stenting of the proximal circumflex in 2017 by Dr. Tamala Julian. He also has had a previous permanent pacemaker prior to his stent placement in 2017. He had recently noticed the onset of exertional dyspnea and an echocardiogram showed an EF of around 40% with inferolateral hypokinesis. On 1/24 he had a myocardial perfusion scan that showed a paced rhythm. He had evidence of a partially reversible defect in the inferolateral distribution that was new from a previous myocardial perfusion scan done and 2017. He also had some pacemaker mediated tachycardia and I had sent him recently to Dr. [name] to make some pacemaking adjustments and he changed his pacing mode. Because of the abnormal myocardial perfusion scan he is brought into the hospital this time for repeat cardiac  catheterization. He has not had angina. Of note he had some vague numbness in his left arm following Lexiscan infusion although the symptoms were not previously what he had when he had issues. He denies PND orthopnea or edema. ____________________________ PAST HISTORY  Past Medical Illnesses:  hypertension, hyperlipidemia, diverticulitis, prostate cancer treated with external beam RT and seed implants, history of lower GI bleed thought due to diverticulitis 2014, history of squamous cell cancer of the larynx, restless legs, lumbar disc disease;  Cardiovascular Illnesses:  CAD, lateral wall MI 2001, mitral regurgitation, history of arrhythmia-SVT;  Surgical Procedures:  appendectomy, ganglion cyst, foot surgery, lumbar laminectomy;  NYHA Classification:  I;  Canadian Angina Classification:  Class 0: Asymptomatic;  Cardiology Procedures-Invasive:  3.5/12mm NIR stet to prox circumflex 2001, 3.0/12mm NIR stent circumflex 2002 distal to prior stent, Brachytherapy for ISR in late 2002, 3.0 x 24 mm Cypher stent to second OM covering prior stents, Cath 9/16, 2.5x 12 mm Synergy stent to proximal circ pst dil to 3.25 mm 05/11/16 Dr. Tamala Julian, St. Jude pacemaker implant 2016;  Cardiology Procedures-Noninvasive:  echocardiogram March 2016, lexiscan Myoview July 2017, lexiscan Myoview January 2019;  Cardiac Cath Results:  normal Left main, 30% stenosis proximal LAD, 90% stenosis proximal CFX, 30% stenosis proximal RCA, Synergy stent to proximal circumflex;  Peripheral Vascular Procedures:  carotid doppler April 2017;  LVEF of 37% documented via nuclear study on 09/18/2017,   ____________________________ CARDIO-PULMONARY TEST DATES EKG Date:  09/05/2017;   Cardiac Cath Date:  05/05/2015;  Nuclear Study Date:  09/18/2017;  Echocardiography Date: 09/15/2017;  Chest Xray Date: 09/05/2017;   ____________________________ FAMILY HISTORY Brother --  Brother alive with problem, Diabetes mellitus Father -- Father dead, Death due to  natural cause Mother -- Mother dead, Carcinoma of breast ____________________________ SOCIAL HISTORY Alcohol Use:  no alcohol use;  Smoking:  25 pack year history, used to smoke but quit 2000;  Diet:  regular diet;  Lifestyle:  divorced and remarried;  Exercise:  exercises regularly;  Occupation:  retired Nurse, children's;  Residence:  lives with wife;   ____________________________ REVIEW OF SYSTEMS General:  denies recent weight change, fatique or change in exercise tolerance.  Integumentary:skin cancers Eyes: cataract extraction bilaterally, photophobia Ears, Nose, Throat, Mouth:  denies any hearing loss, epistaxis, hoarseness or difficulty speaking. Respiratory: denies dyspnea, cough, wheezing or hemoptysis. Cardiovascular:  please review HPI Abdominal: denies dyspepsia, GI bleeding, constipation, or diarrhea Genitourinary-Male: urgency, erectile dysfunction  Musculoskeletal:  arthritis of the thumbs, lumbar disc disease Neurological:  restless legs Psychiatric:  insomnia  ____________________________ PHYSICAL EXAMINATION VITAL SIGNS  Blood Pressure:  150/62 Sitting, Right arm, regular cuff  , 146/60 Standing, Right arm and regular cuff   Pulse:  72/min. Weight:  176.00 lbs. Height:  69.5"BMI: 25  Constitutional:  pleasant white male in no acute distress Skin:  warm and dry to touch, no apparent skin lesions, or masses noted. Head:  normocephalic, normal hair pattern, no masses or tenderness Eyes:  EOMS Intact, PERRLA, C and S clear, Funduscopic exam not done. ENT:  ears, nose and throat reveal no gross abnormalities.  Dentition good. Neck:  supple, without massess. No JVD, thyromegaly or carotid bruits. Carotid upstroke normal. Chest:  clear to auscultation, healed pacemaker incision in the left pectoral area Cardiac:  regular rhythm, normal S1 and S2, No S3 or S4, no murmurs, gallops or rubs detected. Abdomen:  abdomen soft,non-tender, no masses, no hepatospenomegaly, or aneurysm  noted Peripheral Pulses:  the femoral,dorsalis pedis, and posterior tibial pulses are full and equal bilaterally with no bruits auscultated. Extremities & Back:  no spinal abnormalities noted., normal muscle strength and tone., 1+ edema, RLE venous insufficiency changes present Neurological:  no gross motor or sensory deficits noted, affect appropriate, oriented x3. ____________________________ MOST RECENT LIPID PANEL 08/11/17  CHOL TOTL 121 mg/dl, LDL 59 NM, HDL 49 mg/dl, TRIGLYCER 66 mg/dl and CHOL/HDL 2.5 (Calc) ____________________________ IMPRESSIONS/PLAN  1. Recent onset of exertional dyspnea unclear whether this is related to reduced LV function, and adequate chronotropic response to exercise or recurrent ischemia in light of abnormalities the myocardial perfusion scan 2. CAD with multiple stents including brachytherapy back in early 2000's to the circumflex with previous circumflex stenting in 2017 3. Hyperlipidemia under treatment 4. Hypertensive heart disease 5. Permanent pacemaker 6. Old lateral wall myocardial infarction  Recommendations:  Patient brought in for same-day cardiac catheterization. Cardiac catheterization was discussed with the patient including risks of myocardial infarction, death, stroke, bleeding, arrhythmia, dye allergy, or renal insufficiency. He understands and is willing to proceed. Possibility of percutaneous intervention at the same setting was also discussed with the patient including risks.  ____________________________ TODAYS ORDERS  1. Draw PT/INR: Today  2. Comprehensive Metabolic Panel: Today  3. Complete Blood Count: Today  4. PTT: Today                        ____________________________ Cardiology Physician:  Kerry Hough MD [facility]

## 2017-09-19 ENCOUNTER — Encounter (HOSPITAL_COMMUNITY)
Admission: RE | Disposition: A | Discharge status: Home / Self Care | Payer: Self-pay | Source: Ambulatory Visit | Attending: Cardiovascular Disease

## 2017-09-19 ENCOUNTER — Ambulatory Visit (HOSPITAL_COMMUNITY)
Admission: RE | Admit: 2017-09-19 | Discharge: 2017-09-19 | Disposition: A | Discharge status: Home / Self Care | Payer: Medicare Other | Source: Ambulatory Visit | Attending: Cardiovascular Disease | Admitting: Cardiovascular Disease

## 2017-09-19 DIAGNOSIS — I252 Old myocardial infarction: Secondary | ICD-10-CM | POA: Insufficient documentation

## 2017-09-19 DIAGNOSIS — I10 Essential (primary) hypertension: Secondary | ICD-10-CM | POA: Diagnosis not present

## 2017-09-19 DIAGNOSIS — I255 Ischemic cardiomyopathy: Secondary | ICD-10-CM | POA: Insufficient documentation

## 2017-09-19 DIAGNOSIS — Z7982 Long term (current) use of aspirin: Secondary | ICD-10-CM | POA: Diagnosis not present

## 2017-09-19 DIAGNOSIS — Z888 Allergy status to other drugs, medicaments and biological substances status: Secondary | ICD-10-CM | POA: Insufficient documentation

## 2017-09-19 DIAGNOSIS — Z79899 Other long term (current) drug therapy: Secondary | ICD-10-CM | POA: Insufficient documentation

## 2017-09-19 DIAGNOSIS — I2510 Atherosclerotic heart disease of native coronary artery without angina pectoris: Secondary | ICD-10-CM | POA: Diagnosis not present

## 2017-09-19 DIAGNOSIS — R0602 Shortness of breath: Secondary | ICD-10-CM | POA: Diagnosis not present

## 2017-09-19 DIAGNOSIS — R079 Chest pain, unspecified: Secondary | ICD-10-CM | POA: Diagnosis present

## 2017-09-19 DIAGNOSIS — Z881 Allergy status to other antibiotic agents status: Secondary | ICD-10-CM | POA: Diagnosis not present

## 2017-09-19 DIAGNOSIS — Z7902 Long term (current) use of antithrombotics/antiplatelets: Secondary | ICD-10-CM | POA: Insufficient documentation

## 2017-09-19 DIAGNOSIS — R9439 Abnormal result of other cardiovascular function study: Secondary | ICD-10-CM

## 2017-09-19 DIAGNOSIS — Z955 Presence of coronary angioplasty implant and graft: Secondary | ICD-10-CM | POA: Insufficient documentation

## 2017-09-19 DIAGNOSIS — Z950 Presence of cardiac pacemaker: Secondary | ICD-10-CM | POA: Diagnosis not present

## 2017-09-19 DIAGNOSIS — I119 Hypertensive heart disease without heart failure: Secondary | ICD-10-CM | POA: Diagnosis not present

## 2017-09-19 DIAGNOSIS — Z87891 Personal history of nicotine dependence: Secondary | ICD-10-CM | POA: Insufficient documentation

## 2017-09-19 DIAGNOSIS — E785 Hyperlipidemia, unspecified: Secondary | ICD-10-CM | POA: Insufficient documentation

## 2017-09-19 HISTORY — PX: LEFT HEART CATH AND CORONARY ANGIOGRAPHY: CATH118249

## 2017-09-19 LAB — BASIC METABOLIC PANEL
Anion gap: 10 (ref 5–15)
BUN: 29 mg/dL — ABNORMAL HIGH (ref 6–20)
CO2: 25 mmol/L (ref 22–32)
Calcium: 9 mg/dL (ref 8.9–10.3)
Chloride: 108 mmol/L (ref 101–111)
Creatinine, Ser: 1.03 mg/dL (ref 0.61–1.24)
GFR calc Af Amer: >60 mL/min (ref >60)
GFR calc non Af Amer: >60 mL/min (ref >60)
Glucose, Bld: 96 mg/dL (ref 65–99)
Potassium: 4.3 mmol/L (ref 3.5–5.1)
Sodium: 143 mmol/L (ref 135–145)

## 2017-09-19 LAB — CBC
HCT: 39.6 % (ref 39.0–52.0)
Hemoglobin: 13.7 g/dL (ref 13.0–17.0)
MCH: 31.9 pg (ref 26.0–34.0)
MCHC: 34.6 g/dL (ref 30.0–36.0)
MCV: 92.1 fL (ref 78.0–100.0)
Platelets: 140 10*3/uL — ABNORMAL LOW (ref 150–400)
RBC: 4.3 MIL/uL (ref 4.22–5.81)
RDW: 15.1 % (ref 11.5–15.5)
WBC: 8.3 10*3/uL (ref 4.0–10.5)

## 2017-09-19 LAB — PROTIME-INR
INR: 1.03
Prothrombin Time: 13.4 seconds (ref 11.4–15.2)

## 2017-09-19 SURGERY — LEFT HEART CATH AND CORONARY ANGIOGRAPHY
Anesthesia: LOCAL

## 2017-09-19 MED ORDER — FENTANYL CITRATE (PF) 100 MCG/2ML IJ SOLN
INTRAMUSCULAR | Status: DC | PRN
  Administered 2017-09-19: 25 ug via INTRAVENOUS

## 2017-09-19 MED ORDER — ASPIRIN 81 MG PO CHEW
CHEWABLE_TABLET | ORAL | Status: AC
  Filled 2017-09-19: qty 1

## 2017-09-19 MED ORDER — IOPAMIDOL (ISOVUE-370) INJECTION 76%
INTRAVENOUS | Status: DC | PRN
  Administered 2017-09-19: 70 mL

## 2017-09-19 MED ORDER — SODIUM CHLORIDE 0.9 % IV SOLN: INTRAVENOUS | Status: AC

## 2017-09-19 MED ORDER — HEPARIN (PORCINE) IN NACL 2-0.9 UNIT/ML-% IJ SOLN
INTRAMUSCULAR | Status: DC | PRN
Start: 2017-09-19 — End: 2017-09-19
  Administered 2017-09-19: 08:00:00

## 2017-09-19 MED ORDER — HEPARIN (PORCINE) IN NACL 2-0.9 UNIT/ML-% IJ SOLN
INTRAMUSCULAR | Status: AC
  Filled 2017-09-19: qty 1000

## 2017-09-19 MED ORDER — SODIUM CHLORIDE 0.9 % IV SOLN: 250.0000 mL | INTRAVENOUS | Status: DC | PRN

## 2017-09-19 MED ORDER — SODIUM CHLORIDE 0.9% FLUSH: 3.0000 mL | INTRAVENOUS | Status: DC | PRN

## 2017-09-19 MED ORDER — SODIUM CHLORIDE 0.9 % WEIGHT BASED INFUSION
3.0000 mL/kg/h | INTRAVENOUS | Status: AC
  Administered 2017-09-19: 3 mL/kg/h via INTRAVENOUS

## 2017-09-19 MED ORDER — LIDOCAINE HCL (PF) 1 % IJ SOLN
INTRAMUSCULAR | Status: DC | PRN
  Administered 2017-09-19: 2 mL

## 2017-09-19 MED ORDER — HEPARIN SODIUM (PORCINE) 1000 UNIT/ML IJ SOLN
INTRAMUSCULAR | Status: DC | PRN
  Administered 2017-09-19: 4000 [IU] via INTRAVENOUS

## 2017-09-19 MED ORDER — LIDOCAINE HCL 1 % IJ SOLN
INTRAMUSCULAR | Status: AC
  Filled 2017-09-19: qty 20

## 2017-09-19 MED ORDER — MIDAZOLAM HCL 2 MG/2ML IJ SOLN
INTRAMUSCULAR | Status: DC | PRN
  Administered 2017-09-19: 1 mg via INTRAVENOUS

## 2017-09-19 MED ORDER — SODIUM CHLORIDE 0.9% FLUSH: 3.0000 mL | Freq: Two times a day (BID) | INTRAVENOUS | Status: DC

## 2017-09-19 MED ORDER — VERAPAMIL HCL 2.5 MG/ML IV SOLN
INTRAVENOUS | Status: DC | PRN
  Administered 2017-09-19: 10 mL via INTRA_ARTERIAL

## 2017-09-19 MED ORDER — HEPARIN SODIUM (PORCINE) 1000 UNIT/ML IJ SOLN
INTRAMUSCULAR | Status: AC
  Filled 2017-09-19: qty 1

## 2017-09-19 MED ORDER — VERAPAMIL HCL 2.5 MG/ML IV SOLN
INTRAVENOUS | Status: AC
  Filled 2017-09-19: qty 2

## 2017-09-19 MED ORDER — IOPAMIDOL (ISOVUE-370) INJECTION 76%
INTRAVENOUS | Status: AC
  Filled 2017-09-19: qty 100

## 2017-09-19 MED ORDER — MIDAZOLAM HCL 2 MG/2ML IJ SOLN
INTRAMUSCULAR | Status: AC
  Filled 2017-09-19: qty 2

## 2017-09-19 MED ORDER — SODIUM CHLORIDE 0.9 % WEIGHT BASED INFUSION: 1.0000 mL/kg/h | INTRAVENOUS | Status: DC

## 2017-09-19 MED ORDER — FENTANYL CITRATE (PF) 100 MCG/2ML IJ SOLN
INTRAMUSCULAR | Status: AC
  Filled 2017-09-19: qty 2

## 2017-09-19 MED ORDER — ASPIRIN 81 MG PO CHEW
81.0000 mg | CHEWABLE_TABLET | ORAL | Status: AC
  Administered 2017-09-19: 81 mg via ORAL

## 2017-09-19 SURGICAL SUPPLY — 9 items

## 2017-09-19 NOTE — Discharge Instructions (Signed)
**Note Stonewall Doss-identified via Obfuscation** Radial Site Care °Refer to this sheet in the next few weeks. These instructions provide you with information about caring for yourself after your procedure. Your health care provider may also give you more specific instructions. Your treatment has been planned according to current medical practices, but problems sometimes occur. Call your health care provider if you have any problems or questions after your procedure. °What can I expect after the procedure? °After your procedure, it is typical to have the following: °· Bruising at the radial site that usually fades within 1-2 weeks. °· Blood collecting in the tissue (hematoma) that may be painful to the touch. It should usually decrease in size and tenderness within 1-2 weeks. ° °Follow these instructions at home: °· Take medicines only as directed by your health care provider. °· You may shower 24-48 hours after the procedure or as directed by your health care provider. Remove the bandage (dressing) and gently wash the site with plain soap and water. Pat the area dry with a clean towel. Do not rub the site, because this may cause bleeding. °· Do not take baths, swim, or use a hot tub until your health care provider approves. °· Check your insertion site every day for redness, swelling, or drainage. °· Do not apply powder or lotion to the site. °· Do not flex or bend the affected arm for 24 hours or as directed by your health care provider. °· Do not push or pull heavy objects with the affected arm for 24 hours or as directed by your health care provider. °· Do not lift over 10 lb (4.5 kg) for 5 days after your procedure or as directed by your health care provider. °· Ask your health care provider when it is okay to: °? Return to work or school. °? Resume usual physical activities or sports. °? Resume sexual activity. °· Do not drive home if you are discharged the same day as the procedure. Have someone else drive you. °· You may drive 24 hours after the procedure  unless otherwise instructed by your health care provider. °· Do not operate machinery or power tools for 24 hours after the procedure. °· If your procedure was done as an outpatient procedure, which means that you went home the same day as your procedure, a responsible adult should be with you for the first 24 hours after you arrive home. °· Keep all follow-up visits as directed by your health care provider. This is important. °Contact a health care provider if: °· You have a fever. °· You have chills. °· You have increased bleeding from the radial site. Hold pressure on the site. °Get help right away if: °· You have unusual pain at the radial site. °· You have redness, warmth, or swelling at the radial site. °· You have drainage (other than a small amount of blood on the dressing) from the radial site. °· The radial site is bleeding, and the bleeding does not stop after 30 minutes of holding steady pressure on the site. °· Your arm or hand becomes pale, cool, tingly, or numb. °This information is not intended to replace advice given to you by your health care provider. Make sure you discuss any questions you have with your health care provider. °Document Released: 09/14/2010 Document Revised: 01/18/2016 Document Reviewed: 02/28/2014 °Elsevier Interactive Patient Education © 2018 [employer] Inc. ° °

## 2017-09-19 NOTE — Interval H&P Note (Signed)
History and Physical Interval Note:  09/19/2017 7:39 AM  Andrew Marshall  has presented today for cardiac cath with the diagnosis of unstable angina  The various methods of treatment have been discussed with the patient and family. After consideration of risks, benefits and other options for treatment, the patient has consented to  Procedure(s): LEFT HEART CATH AND CORONARY ANGIOGRAPHY (N/A) as a surgical intervention .  The patient's history has been reviewed, patient examined, no change in status, stable for surgery.  I have reviewed the patient's chart and labs.  Questions were answered to the patient's satisfaction.    Cath Lab Visit (complete for each Cath Lab visit)  Clinical Evaluation Leading to the Procedure:   ACS: No.  Non-ACS:    Anginal Classification: CCS III  Anti-ischemic medical therapy: Minimal Therapy (1 class of medications)  Non-Invasive Test Results: Intermediate-risk stress test findings: cardiac mortality 1-3%/year  Prior CABG: No previous CABG         Andrew Marshall

## 2017-09-21 ENCOUNTER — Encounter (HOSPITAL_COMMUNITY): Payer: Self-pay | Admitting: Cardiovascular Disease

## 2017-09-22 DIAGNOSIS — H6123 Impacted cerumen, bilateral: Secondary | ICD-10-CM | POA: Diagnosis not present

## 2017-09-25 DIAGNOSIS — Z950 Presence of cardiac pacemaker: Secondary | ICD-10-CM | POA: Diagnosis not present

## 2017-09-25 DIAGNOSIS — I119 Hypertensive heart disease without heart failure: Secondary | ICD-10-CM | POA: Diagnosis not present

## 2017-09-25 DIAGNOSIS — R0609 Other forms of dyspnea: Secondary | ICD-10-CM | POA: Diagnosis not present

## 2017-09-25 DIAGNOSIS — Z01810 Encounter for preprocedural cardiovascular examination: Secondary | ICD-10-CM | POA: Diagnosis not present

## 2017-09-25 DIAGNOSIS — E7849 Other hyperlipidemia: Secondary | ICD-10-CM | POA: Diagnosis not present

## 2017-09-25 DIAGNOSIS — I2510 Atherosclerotic heart disease of native coronary artery without angina pectoris: Secondary | ICD-10-CM | POA: Diagnosis not present

## 2017-09-25 DIAGNOSIS — I252 Old myocardial infarction: Secondary | ICD-10-CM | POA: Diagnosis not present

## 2017-10-01 DIAGNOSIS — K625 Hemorrhage of anus and rectum: Secondary | ICD-10-CM | POA: Diagnosis not present

## 2017-10-06 ENCOUNTER — Encounter: Payer: Medicare Other | Admitting: Internal Medicine

## 2017-10-09 ENCOUNTER — Encounter: Payer: Self-pay | Admitting: Internal Medicine

## 2017-10-09 ENCOUNTER — Ambulatory Visit (INDEPENDENT_AMBULATORY_CARE_PROVIDER_SITE_OTHER): Payer: Medicare Other | Admitting: Internal Medicine

## 2017-10-09 VITALS — BP 124/64 | HR 60 | Ht 69.0 in | Wt 179.0 lb

## 2017-10-09 DIAGNOSIS — I442 Atrioventricular block, complete: Secondary | ICD-10-CM | POA: Diagnosis not present

## 2017-10-09 DIAGNOSIS — R0602 Shortness of breath: Secondary | ICD-10-CM | POA: Diagnosis not present

## 2017-10-09 DIAGNOSIS — I480 Paroxysmal atrial fibrillation: Secondary | ICD-10-CM | POA: Diagnosis not present

## 2017-10-09 DIAGNOSIS — Z950 Presence of cardiac pacemaker: Secondary | ICD-10-CM | POA: Diagnosis not present

## 2017-10-09 LAB — CUP PACEART INCLINIC DEVICE CHECK
Date Time Interrogation Session: 20190214150547
Implantable Lead Implant Date: 20160329
Implantable Lead Implant Date: 20160329
Implantable Lead Location: 753859
Implantable Lead Location: 753860
Implantable Lead Model: 1948
Implantable Pulse Generator Implant Date: 20160329
Pulse Gen Model: 2240
Pulse Gen Serial Number: 7732903

## 2017-10-09 NOTE — Progress Notes (Signed)
PCP: Seward Carol, MD   Primary EP:  Dr Dema Severin Andrew Marshall is a 84 y.o. male who presents today for routine electrophysiology followup.  Since last being seen in our clinic, the patient reports doing reasonably well.  Continues to have some SOB when riding his bike.  Cath 09/19/17 is reviewed and reveals stable nonobstructive CAD.   Today, he denies symptoms of palpitations, chest pain,  lower extremity edema, dizziness, presyncope, or syncope.  The patient is otherwise without complaint today.   Past Medical History:  Diagnosis Date  . Allergic rhinitis   . Arthritis    "thumbs primarily" (05/05/2015)  . Basal cell carcinoma, face    "have had several; cut and burned off"  . CAD (coronary artery disease)    a. 2001 s/p post MI- multiple PCI's to LCX/OM;  b. 2003 DES to OM;  c. 05/2012 MV: postlat infarct w/ mild peri-infarct ischemia. nl EF->Med Rx.  . Complete heart block ([place])    St. Jude pacemaker implanted 11/22/14  . ED (erectile dysfunction)   . Esophageal reflux    "slight"  . Essential hypertension, benign   . Heart murmur   . History of atrial fibrillation   . History of GI diverticular bleed    a. 05/2013 and 02/2014 - taken off of ASA for this reason.  . Metatarsalgia   . MI (myocardial infarction) ([place]) 2001  . Moderate mitral insufficiency    a. 10/2014 Echo: EF 55-60%, Gr1 DD, mild AS, mod MR, sev dil LA, nl RV, mildly dil RA, mild TR, PASP 19mmHg.  . Pain, foot, chronic   . Pes planus   . Presence of permanent cardiac pacemaker   . 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 Prostate cancer ([place]) 2008   S/P "radiation and seed implants"  . Pure hypercholesterolemia   . RLS (restless legs syndrome)    Past Surgical History:  Procedure Laterality Date  . APPENDECTOMY  1956  . BASAL CELL CARCINOMA EXCISION     "face"  . CARDIAC CATHETERIZATION N/A 05/05/2015   Procedure: Left Heart Cath and Coronary Angiography;  Surgeon: Belva Crome, MD;   Location: [facility];  Service: Cardiovascular;  Laterality: N/A;  . CARDIAC CATHETERIZATION N/A 05/05/2015   Procedure: Coronary Stent Intervention;  Surgeon: Belva Crome, MD;  Location: [facility];  Service: Cardiovascular;  Laterality: N/A;  . CATARACT EXTRACTION W/ INTRAOCULAR LENS IMPLANT Left 12/2014  . COLONOSCOPY W/ POLYPECTOMY    . CORONARY ANGIOPLASTY    . CORONARY ANGIOPLASTY WITH STENT PLACEMENT  04/2000; ? ~ 2002; 01/2002  . ESTERNAL BEAM XRT AND SEEDS FOR PROSTATE CANCER    . EYE SURGERY Right 07/2016  . FOOT ARTHRODESIS, TRIPLE Right 2007  . GANGLION CYST EXCISION Right 1970's   wrist  . INSERT / REPLACE / REMOVE PACEMAKER  2017  . LEFT HEART CATH AND CORONARY ANGIOGRAPHY N/A 09/19/2017   Procedure: LEFT HEART CATH AND CORONARY ANGIOGRAPHY;  Surgeon: Burnell Blanks, MD;  Location: [facility];  Service: Cardiovascular;  Laterality: N/A;  . LUMBAR LAMINECTOMY/DECOMPRESSION MICRODISCECTOMY Left 09/18/2016   Procedure: LEFT LUMBAR THREE- LUMBAR FOUR LUMBAR LAMINOTOMY AND MICRODISCECTOMY;  Surgeon: Jovita Gamma, MD;  Location: [facility];  Service: Neurosurgery;  Laterality: Left;  LEFT LUMBAR 3- LUMBAR 4 LUMBAR LAMINOTOMY AND MICRODISCECTOMY  . PERMANENT PACEMAKER INSERTION N/A 11/22/2014   Procedure: PERMANENT PACEMAKER INSERTION;  Surgeon: [name], MD;  Location: [facility]  CATH LAB;  Service: Cardiovascular;  Laterality: N/A;  . PROSTATE BIOPSY    . TONSILLECTOMY  1942    ROS- all systems are reviewed and negative except as per HPI above  Current Outpatient Medications  Medication Sig Dispense Refill  . aspirin EC 81 MG tablet Take 1 tablet (81 mg total) by mouth daily.    Marland Kitchen atorvastatin (LIPITOR) 40 MG tablet TAKE 1 TABLET (40 MG TOTAL) BY MOUTH DAILY. 90 tablet 2  . carvedilol (COREG) 12.5 MG tablet Take 12.5 mg by mouth 2 (two) times daily with a meal.    . cetirizine (ZYRTEC) 10 MG tablet Take 10 mg by mouth daily.     . clopidogrel (PLAVIX) 75  MG tablet TAKE ONE TABLET BY MOUTH DAILY 90 tablet 1  . Naphazoline HCl (CLEAR EYES OP) Place 1 drop into both eyes daily as needed (for dry eyes).    . nitroGLYCERIN (NITROSTAT) 0.4 MG SL tablet Place 1 tablet (0.4 mg total) under the tongue every 5 (five) minutes as needed for chest pain (MAX 3 TABLETS). 25 tablet 3  . sildenafil (VIAGRA) 100 MG tablet Take 100 mg by mouth daily as needed for erectile dysfunction.     . valsartan (DIOVAN) 320 MG tablet Take 320 mg by mouth daily.     No current facility-administered medications for this visit.     Physical Exam: Vitals:   10/09/17 1143  BP: 124/64  Pulse: 60  Weight: 179 lb (81.2 kg)  Height: 5\' 9"  (1.753 m)    GEN- The patient is well appearing, alert and oriented x 3 today.   Head- normocephalic, atraumatic Eyes-  Sclera clear, conjunctiva pink Ears- hearing intact Oropharynx- clear Lungs- Clear to ausculation bilaterally, normal work of breathing Chest- pacemaker pocket is well healed Heart- Regular rate and rhythm, no murmurs, rubs or gallops, PMI not laterally displaced GI- soft, NT, ND, + BS Extremities- no clubbing, cyanosis, or edema  Pacemaker interrogation- reviewed in detail today,  See PACEART report  ekg tracing ordered today is personally reviewed and shows AV paced rhythm  Assessment and Plan:  1. Symptomatic sinus bradycardia and complete heart block Normal pacemaker function See Pace Art report Today, with ambulation, he had a modest increase in heart rate response with slope changed from 8 to 11.  He stated that he felt "great" with this change.  He will contact our office if he notices that heart rates are too high or any new concerns with the changes made today  2. afib Very low AF burden (no episodes since last visit) Wishes to avoid anticoagulation  3. HTN Stable No change required today  Merlin Return to see me in a year Follow-up with Dr Wynonia Lawman as scheduled  [name] MD,  [facility] 10/09/2017 12:31 PM

## 2017-10-09 NOTE — Patient Instructions (Signed)
Medication Instructions:  Your physician recommends that you continue on your current medications as directed. Please refer to the Current Medication list given to you today.  Labwork: None ordered.  Testing/Procedures: None ordered.  Follow-Up: Your physician wants you to follow-up in: one year with Dr. [name].   You will receive a reminder letter in the mail two months in advance. If you don't receive a letter, please call our office to schedule the follow-up appointment.  Remote monitoring is used to monitor your Pacemaker from home. This monitoring reduces the number of office visits required to check your device to one time per year. It allows [place] to keep an eye on the functioning of your device to ensure it is working properly. You are scheduled for a device check from home on 11/03/2017. You may send your transmission at any time that day. If you have a wireless device, the transmission will be sent automatically. After your physician reviews your transmission, you will receive a postcard with your next transmission date.  Any Other Special Instructions Will Be Listed Below (If Applicable).  If you need a refill on your cardiac medications before your next appointment, please call your pharmacy.

## 2017-10-28 DIAGNOSIS — R0609 Other forms of dyspnea: Secondary | ICD-10-CM | POA: Diagnosis not present

## 2017-10-28 DIAGNOSIS — Z950 Presence of cardiac pacemaker: Secondary | ICD-10-CM | POA: Diagnosis not present

## 2017-10-28 DIAGNOSIS — E785 Hyperlipidemia, unspecified: Secondary | ICD-10-CM | POA: Diagnosis not present

## 2017-10-28 DIAGNOSIS — I119 Hypertensive heart disease without heart failure: Secondary | ICD-10-CM | POA: Diagnosis not present

## 2017-10-28 DIAGNOSIS — I2510 Atherosclerotic heart disease of native coronary artery without angina pectoris: Secondary | ICD-10-CM | POA: Diagnosis not present

## 2017-10-28 DIAGNOSIS — I252 Old myocardial infarction: Secondary | ICD-10-CM | POA: Diagnosis not present

## 2017-11-03 ENCOUNTER — Ambulatory Visit (INDEPENDENT_AMBULATORY_CARE_PROVIDER_SITE_OTHER): Payer: Medicare Other | Admitting: *Deleted

## 2017-11-03 DIAGNOSIS — I442 Atrioventricular block, complete: Secondary | ICD-10-CM

## 2017-11-03 NOTE — Progress Notes (Signed)
Remote pacemaker transmission.   

## 2017-11-05 ENCOUNTER — Encounter: Payer: Self-pay | Admitting: Cardiology

## 2017-11-07 ENCOUNTER — Encounter: Payer: Self-pay | Admitting: Cardiology

## 2017-11-07 DIAGNOSIS — I2510 Atherosclerotic heart disease of native coronary artery without angina pectoris: Secondary | ICD-10-CM

## 2017-11-10 ENCOUNTER — Other Ambulatory Visit: Payer: Self-pay | Admitting: Cardiology

## 2017-11-10 DIAGNOSIS — I10 Essential (primary) hypertension: Secondary | ICD-10-CM | POA: Diagnosis not present

## 2017-11-10 DIAGNOSIS — I209 Angina pectoris, unspecified: Secondary | ICD-10-CM | POA: Diagnosis not present

## 2017-11-10 DIAGNOSIS — M189 Osteoarthritis of first carpometacarpal joint, unspecified: Secondary | ICD-10-CM | POA: Diagnosis not present

## 2017-11-10 DIAGNOSIS — I4891 Unspecified atrial fibrillation: Secondary | ICD-10-CM | POA: Diagnosis not present

## 2017-11-10 DIAGNOSIS — I2510 Atherosclerotic heart disease of native coronary artery without angina pectoris: Secondary | ICD-10-CM | POA: Diagnosis not present

## 2017-11-22 LAB — CUP PACEART REMOTE DEVICE CHECK
Battery Remaining Longevity: 115 mo
Battery Remaining Percentage: 95.5 %
Battery Voltage: 2.99 V
Brady Statistic AP VP Percent: 85 %
Brady Statistic AP VS Percent: 1 %
Brady Statistic AS VP Percent: 11 %
Brady Statistic AS VS Percent: 1 %
Brady Statistic RA Percent Paced: 82 %
Brady Statistic RV Percent Paced: 96 %
Date Time Interrogation Session: 20190311070014
Implantable Lead Implant Date: 20160329
Implantable Lead Implant Date: 20160329
Implantable Lead Location: 753859
Implantable Lead Location: 753860
Implantable Lead Model: 1948
Implantable Pulse Generator Implant Date: 20160329
Lead Channel Impedance Value: 400 Ohm
Lead Channel Impedance Value: 680 Ohm
Lead Channel Pacing Threshold Amplitude: 0.75 V
Lead Channel Pacing Threshold Amplitude: 0.75 V
Lead Channel Pacing Threshold Pulse Width: 0.4 ms
Lead Channel Pacing Threshold Pulse Width: 0.4 ms
Lead Channel Sensing Intrinsic Amplitude: 12 mV
Lead Channel Sensing Intrinsic Amplitude: 2 mV
Lead Channel Setting Pacing Amplitude: 1 V
Lead Channel Setting Pacing Amplitude: 2.5 V
Lead Channel Setting Pacing Pulse Width: 0.4 ms
Lead Channel Setting Sensing Sensitivity: 2.5 mV
Pulse Gen Model: 2240
Pulse Gen Serial Number: 7732903

## 2017-11-27 DIAGNOSIS — K921 Melena: Secondary | ICD-10-CM | POA: Diagnosis not present

## 2017-11-27 DIAGNOSIS — R194 Change in bowel habit: Secondary | ICD-10-CM | POA: Diagnosis not present

## 2017-12-09 DIAGNOSIS — H26492 Other secondary cataract, left eye: Secondary | ICD-10-CM | POA: Diagnosis not present

## 2017-12-09 DIAGNOSIS — H16223 Keratoconjunctivitis sicca, not specified as Sjogren's, bilateral: Secondary | ICD-10-CM | POA: Diagnosis not present

## 2017-12-09 DIAGNOSIS — I4891 Unspecified atrial fibrillation: Secondary | ICD-10-CM | POA: Diagnosis not present

## 2017-12-09 DIAGNOSIS — I10 Essential (primary) hypertension: Secondary | ICD-10-CM | POA: Diagnosis not present

## 2017-12-09 DIAGNOSIS — M189 Osteoarthritis of first carpometacarpal joint, unspecified: Secondary | ICD-10-CM | POA: Diagnosis not present

## 2017-12-09 DIAGNOSIS — I25119 Atherosclerotic heart disease of native coronary artery with unspecified angina pectoris: Secondary | ICD-10-CM | POA: Diagnosis not present

## 2017-12-19 ENCOUNTER — Telehealth: Payer: Self-pay | Admitting: Internal Medicine

## 2017-12-19 NOTE — Telephone Encounter (Signed)
Call returned to Pt.  Per Pt he is getting short of breath cycling up hills which he states is new for him.  Pt states [employer] offered to come to his house and put his bike on an exerciser and make PPM adjustments.  Spoke with device clinic.  They will discuss with [place] and follow up.

## 2017-12-19 NOTE — Telephone Encounter (Signed)
Pt calling and stated when he is climbing hills he is running out of oxygen and need to speak to nurse. Please call pt.

## 2017-12-22 NOTE — Telephone Encounter (Signed)
[employer] rep. Bryan Small agreeable to meet at patient's home. Number and address confirmed. Pt appreciative.

## 2018-01-08 DIAGNOSIS — R194 Change in bowel habit: Secondary | ICD-10-CM | POA: Diagnosis not present

## 2018-02-02 ENCOUNTER — Ambulatory Visit (INDEPENDENT_AMBULATORY_CARE_PROVIDER_SITE_OTHER): Payer: Medicare Other | Admitting: *Deleted

## 2018-02-02 DIAGNOSIS — I442 Atrioventricular block, complete: Secondary | ICD-10-CM | POA: Diagnosis not present

## 2018-02-02 NOTE — Progress Notes (Signed)
Remote pacemaker transmission.   

## 2018-02-03 ENCOUNTER — Encounter: Payer: Self-pay | Admitting: Cardiology

## 2018-02-03 DIAGNOSIS — Z8546 Personal history of malignant neoplasm of prostate: Secondary | ICD-10-CM | POA: Diagnosis not present

## 2018-02-03 DIAGNOSIS — N5201 Erectile dysfunction due to arterial insufficiency: Secondary | ICD-10-CM | POA: Diagnosis not present

## 2018-02-09 DIAGNOSIS — H04123 Dry eye syndrome of bilateral lacrimal glands: Secondary | ICD-10-CM | POA: Diagnosis not present

## 2018-02-09 DIAGNOSIS — H11823 Conjunctivochalasis, bilateral: Secondary | ICD-10-CM | POA: Diagnosis not present

## 2018-02-09 DIAGNOSIS — L718 Other rosacea: Secondary | ICD-10-CM | POA: Diagnosis not present

## 2018-02-16 LAB — CUP PACEART REMOTE DEVICE CHECK
Battery Remaining Longevity: 112 mo
Battery Remaining Percentage: 95.5 %
Battery Voltage: 2.98 V
Brady Statistic AP VP Percent: 96 %
Brady Statistic AP VS Percent: 1 %
Brady Statistic AS VP Percent: 1.7 %
Brady Statistic AS VS Percent: 1 %
Brady Statistic RA Percent Paced: 94 %
Brady Statistic RV Percent Paced: 97 %
Date Time Interrogation Session: 20190610060014
Implantable Lead Implant Date: 20160329
Implantable Lead Implant Date: 20160329
Implantable Lead Location: 753859
Implantable Lead Location: 753860
Implantable Lead Model: 1948
Implantable Pulse Generator Implant Date: 20160329
Lead Channel Impedance Value: 410 Ohm
Lead Channel Impedance Value: 690 Ohm
Lead Channel Pacing Threshold Amplitude: 0.75 V
Lead Channel Pacing Threshold Amplitude: 0.75 V
Lead Channel Pacing Threshold Pulse Width: 0.4 ms
Lead Channel Pacing Threshold Pulse Width: 0.4 ms
Lead Channel Sensing Intrinsic Amplitude: 12 mV
Lead Channel Sensing Intrinsic Amplitude: 3.7 mV
Lead Channel Setting Pacing Amplitude: 1 V
Lead Channel Setting Pacing Amplitude: 2.5 V
Lead Channel Setting Pacing Pulse Width: 0.4 ms
Lead Channel Setting Sensing Sensitivity: 2.5 mV
Pulse Gen Model: 2240
Pulse Gen Serial Number: 7732903

## 2018-02-19 ENCOUNTER — Ambulatory Visit
Admission: RE | Admit: 2018-02-19 | Discharge: 2018-02-19 | Disposition: A | Discharge status: Home / Self Care | Payer: Medicare Other | Source: Ambulatory Visit | Attending: Gastroenterology | Admitting: Gastroenterology

## 2018-02-19 ENCOUNTER — Other Ambulatory Visit: Payer: Self-pay | Admitting: Gastroenterology

## 2018-02-19 DIAGNOSIS — R1084 Generalized abdominal pain: Secondary | ICD-10-CM

## 2018-02-19 DIAGNOSIS — R197 Diarrhea, unspecified: Secondary | ICD-10-CM

## 2018-02-19 DIAGNOSIS — R194 Change in bowel habit: Secondary | ICD-10-CM

## 2018-02-25 DIAGNOSIS — M1811 Unilateral primary osteoarthritis of first carpometacarpal joint, right hand: Secondary | ICD-10-CM | POA: Diagnosis not present

## 2018-03-02 DIAGNOSIS — Z85828 Personal history of other malignant neoplasm of skin: Secondary | ICD-10-CM | POA: Diagnosis not present

## 2018-03-02 DIAGNOSIS — L723 Sebaceous cyst: Secondary | ICD-10-CM | POA: Diagnosis not present

## 2018-03-02 DIAGNOSIS — D1801 Hemangioma of skin and subcutaneous tissue: Secondary | ICD-10-CM | POA: Diagnosis not present

## 2018-03-02 DIAGNOSIS — L821 Other seborrheic keratosis: Secondary | ICD-10-CM | POA: Diagnosis not present

## 2018-03-02 DIAGNOSIS — L720 Epidermal cyst: Secondary | ICD-10-CM | POA: Diagnosis not present

## 2018-03-02 DIAGNOSIS — L400 Psoriasis vulgaris: Secondary | ICD-10-CM | POA: Diagnosis not present

## 2018-03-02 DIAGNOSIS — D692 Other nonthrombocytopenic purpura: Secondary | ICD-10-CM | POA: Diagnosis not present

## 2018-03-02 DIAGNOSIS — L570 Actinic keratosis: Secondary | ICD-10-CM | POA: Diagnosis not present

## 2018-03-09 ENCOUNTER — Encounter: Payer: Medicare Other | Admitting: Internal Medicine

## 2018-03-09 DIAGNOSIS — K5900 Constipation, unspecified: Secondary | ICD-10-CM | POA: Diagnosis not present

## 2018-03-25 ENCOUNTER — Other Ambulatory Visit: Payer: Self-pay | Admitting: Physician Assistant

## 2018-04-21 ENCOUNTER — Other Ambulatory Visit: Payer: Self-pay | Admitting: Physician Assistant

## 2018-04-27 DIAGNOSIS — Z23 Encounter for immunization: Secondary | ICD-10-CM | POA: Diagnosis not present

## 2018-04-28 DIAGNOSIS — M5136 Other intervertebral disc degeneration, lumbar region: Secondary | ICD-10-CM | POA: Diagnosis not present

## 2018-04-28 DIAGNOSIS — M62830 Muscle spasm of back: Secondary | ICD-10-CM | POA: Diagnosis not present

## 2018-04-28 DIAGNOSIS — M545 Low back pain: Secondary | ICD-10-CM | POA: Diagnosis not present

## 2018-04-28 DIAGNOSIS — Z6825 Body mass index (BMI) 25.0-25.9, adult: Secondary | ICD-10-CM | POA: Diagnosis not present

## 2018-04-28 DIAGNOSIS — M47816 Spondylosis without myelopathy or radiculopathy, lumbar region: Secondary | ICD-10-CM | POA: Diagnosis not present

## 2018-04-28 DIAGNOSIS — M48062 Spinal stenosis, lumbar region with neurogenic claudication: Secondary | ICD-10-CM | POA: Diagnosis not present

## 2018-04-28 DIAGNOSIS — Z9889 Other specified postprocedural states: Secondary | ICD-10-CM | POA: Diagnosis not present

## 2018-05-04 ENCOUNTER — Encounter: Payer: Self-pay | Admitting: Cardiology

## 2018-05-04 ENCOUNTER — Ambulatory Visit (INDEPENDENT_AMBULATORY_CARE_PROVIDER_SITE_OTHER): Payer: Medicare Other | Admitting: *Deleted

## 2018-05-04 DIAGNOSIS — I442 Atrioventricular block, complete: Secondary | ICD-10-CM

## 2018-05-04 NOTE — Progress Notes (Signed)
Remote pacemaker transmission.   

## 2018-05-07 DIAGNOSIS — H6123 Impacted cerumen, bilateral: Secondary | ICD-10-CM | POA: Diagnosis not present

## 2018-05-11 DIAGNOSIS — M5136 Other intervertebral disc degeneration, lumbar region: Secondary | ICD-10-CM | POA: Diagnosis not present

## 2018-05-11 DIAGNOSIS — M545 Low back pain: Secondary | ICD-10-CM | POA: Diagnosis not present

## 2018-05-11 DIAGNOSIS — Z9889 Other specified postprocedural states: Secondary | ICD-10-CM | POA: Diagnosis not present

## 2018-05-11 DIAGNOSIS — M47816 Spondylosis without myelopathy or radiculopathy, lumbar region: Secondary | ICD-10-CM | POA: Diagnosis not present

## 2018-05-11 DIAGNOSIS — M62830 Muscle spasm of back: Secondary | ICD-10-CM | POA: Diagnosis not present

## 2018-05-26 LAB — CUP PACEART REMOTE DEVICE CHECK
Battery Remaining Longevity: 112 mo
Battery Remaining Percentage: 95.5 %
Battery Voltage: 2.98 V
Brady Statistic AP VP Percent: 96 %
Brady Statistic AP VS Percent: 1 %
Brady Statistic AS VP Percent: 2.3 %
Brady Statistic AS VS Percent: 1 %
Brady Statistic RA Percent Paced: 95 %
Brady Statistic RV Percent Paced: 98 %
Date Time Interrogation Session: 20190909060012
Implantable Lead Implant Date: 20160329
Implantable Lead Implant Date: 20160329
Implantable Lead Location: 753859
Implantable Lead Location: 753860
Implantable Lead Model: 1948
Implantable Pulse Generator Implant Date: 20160329
Lead Channel Impedance Value: 410 Ohm
Lead Channel Impedance Value: 660 Ohm
Lead Channel Pacing Threshold Amplitude: 0.75 V
Lead Channel Pacing Threshold Amplitude: 0.75 V
Lead Channel Pacing Threshold Pulse Width: 0.4 ms
Lead Channel Pacing Threshold Pulse Width: 0.4 ms
Lead Channel Sensing Intrinsic Amplitude: 12 mV
Lead Channel Sensing Intrinsic Amplitude: 3.4 mV
Lead Channel Setting Pacing Amplitude: 1 V
Lead Channel Setting Pacing Amplitude: 2.5 V
Lead Channel Setting Pacing Pulse Width: 0.4 ms
Lead Channel Setting Sensing Sensitivity: 2.5 mV
Pulse Gen Model: 2240
Pulse Gen Serial Number: 7732903

## 2018-06-05 DIAGNOSIS — J069 Acute upper respiratory infection, unspecified: Secondary | ICD-10-CM | POA: Diagnosis not present

## 2018-06-08 ENCOUNTER — Encounter: Payer: Self-pay | Admitting: Cardiology

## 2018-06-08 DIAGNOSIS — I119 Hypertensive heart disease without heart failure: Secondary | ICD-10-CM | POA: Insufficient documentation

## 2018-06-08 NOTE — Progress Notes (Signed)
Andrew Marshall    Date of visit:  10/28/2017 DOB:  11/04/1930    Age:  84 yrs. Medical record number:  [id number]     Account number:  [id number] Primary Care Provider: POLITE MD, RONALD ____________________________ CURRENT DIAGNOSES  1. Dyspnea  2. CAD Native without angina  3. Old myocardial infarction  4. Hypertensive heart disease without heart failure  5. Presence of cardiac pacemaker  6. Hyperlipidemia ____________________________ ALLERGIES  Ambien, Sleep walking  Erythromycin Base, Stomach ache ____________________________ MEDICATIONS  1. amlodipine 2.5 mg tablet, 1 p.o. daily  2. Aspirin Childrens 81 mg chewable tablet, 1 p.o. daily  3. atorvastatin 40 mg tablet, 1 p.o. daily  4. carvedilol 12.5 mg tablet, BID  5. Plavix 75 mg tablet, 1 p.o. daily  6. sildenafil (antihypertensive) 20 mg tablet, 5 tablets PRN  7. valsartan 320 mg tablet, 1 p.o. daily  8. Zyrtec 10 mg capsule, PRN ____________________________ HISTORY OF PRESENT ILLNESS  Patient returns for cardiac followup. He did have some adjustment of his pacemaker but is still having intermittent dyspnea. He describes the dyspnea is worse if he rides his bike on some days but other days he is able to ride his bike without difficulty. He is not currently having angina. Previous catheterization did not show any obstructive coronary disease of significance. His LVEF is low with an ejection fraction of around 40-43%. He has no PND orthopnea or edema. ____________________________ PAST HISTORY  Past Medical Illnesses:  hypertension, hyperlipidemia, diverticulitis, prostate cancer treated with external beam RT and seed implants, history of lower GI bleed thought due to diverticulitis 2014, history of squamous cell cancer of the larynx, restless legs, lumbar disc disease;  Cardiovascular Illnesses:  CAD, lateral wall MI 2001, mitral regurgitation, history of arrhythmia-SVT;  Surgical Procedures:  appendectomy, ganglion cyst, foot  surgery, lumbar laminectomy;  NYHA Classification:  I;  Canadian Angina Classification:  Class 0: Asymptomatic;  Cardiology Procedures-Invasive:  3.5/12mm NIR stet to prox circumflex 2001, 3.0/12mm NIR stent circumflex 2002 distal to prior stent, Brachytherapy for ISR in late 2002, 3.0 x 24 mm Cypher stent to second OM covering prior stents, Cath 9/16, 2.5x 12 mm Synergy stent to proximal circ pst dil to 3.25 mm 05/11/16 Dr. Tamala Julian, St. Jude pacemaker implant 2016;  Cardiology Procedures-Noninvasive:  echocardiogram March 2016, lexiscan Myoview July 2017, lexiscan Myoview January 2019;  Cardiac Cath Results:  normal Left main, 30% stenosis proximal LAD, 90% stenosis proximal CFX, 30% stenosis proximal RCA, Synergy stent to proximal circumflex;  Peripheral Vascular Procedures:  carotid doppler April 2017;  LVEF of 37% documented via nuclear study on 09/18/2017,   ____________________________ CARDIO-PULMONARY TEST DATES EKG Date:  09/05/2017;   Cardiac Cath Date:  09/19/2017;  Nuclear Study Date:  09/18/2017;  Echocardiography Date: 09/15/2017;  Chest Xray Date: 09/05/2017;   ____________________________ FAMILY HISTORY Brother -- Brother alive with problem, Diabetes mellitus Father -- Father dead, Death due to natural cause Mother -- Mother dead, Carcinoma of breast ____________________________ SOCIAL HISTORY Alcohol Use:  no alcohol use;  Smoking:  25 pack year history, used to smoke but quit 2000;  Diet:  regular diet;  Lifestyle:  divorced and remarried;  Exercise:  exercises regularly;  Occupation:  retired Nurse, children's;  Residence:  lives with wife;   ____________________________ REVIEW OF SYSTEMS General:  denies recent weight change, fatique or change in exercise tolerance. Eyes: cataract extraction bilaterally, photophobia Respiratory: denies dyspnea, cough, wheezing or hemoptysis. Cardiovascular:  please review HPI Abdominal: denies dyspepsia, GI bleeding, constipation, or diarrhea  Genitourinary-Male: urgency, erectile dysfunction  Musculoskeletal:  arthritis of the thumbs, lumbar disc disease Neurological:  restless legs Psychiatric:  insomnia  ____________________________ PHYSICAL EXAMINATION VITAL SIGNS  Blood Pressure:  150/68 Sitting, Right arm, large cuff  , 154/70 Standing, Right arm and large cuff   Pulse:  80/min. Weight:  177.00 lbs. Height:  69.5"BMI: 26  Skin:  warm and dry to touch, no apparent skin lesions, or masses noted. Head:  normocephalic, normal hair pattern, no masses or tenderness Neck:  supple, without massess. No JVD, thyromegaly or carotid bruits. Carotid upstroke normal. Chest:  clear to auscultation, healed pacemaker incision in the left pectoral area Cardiac:  regular rhythm, normal S1 and S2, No S3 or S4, no murmurs, gallops or rubs detected. Extremities & Back:  no spinal abnormalities noted., normal muscle strength and tone., 1+ edema, RLE venous insufficiency changes present Neurological:  no gross motor or sensory deficits noted, affect appropriate, oriented x3. ____________________________ MOST RECENT LIPID PANEL 08/11/17  CHOL TOTL 121 mg/dl, LDL 59 NM, HDL 49 mg/dl, TRIGLYCER 66 mg/dl and CHOL/HDL 2.5 (Calc) ____________________________ IMPRESSIONS/PLAN  1. Functioning pacemaker questionable if he still needs adjustment 2. CAD with previous lateral infarct stable coronary disease 3. Hypertensive heart disease above goal 4. Dyspnea likely multifactorial  Recommendations:  Long discussion with patient and wife about symptoms. I would like him to begin amlodipine 2.5 mg for blood pressure and continue to monitor blood pressure. May need to have pacemaker adjusted further. Followup with me in 6 months and otherwise call if problems. ____________________________ TODAYS ORDERS  1. Return Visit: 6 months                       ____________________________ Cardiology Physician:  Kerry Hough MD [facility]

## 2018-07-01 DIAGNOSIS — R197 Diarrhea, unspecified: Secondary | ICD-10-CM | POA: Diagnosis not present

## 2018-07-02 DIAGNOSIS — R197 Diarrhea, unspecified: Secondary | ICD-10-CM | POA: Diagnosis not present

## 2018-07-06 DIAGNOSIS — H906 Mixed conductive and sensorineural hearing loss, bilateral: Secondary | ICD-10-CM | POA: Diagnosis not present

## 2018-07-07 DIAGNOSIS — H7293 Unspecified perforation of tympanic membrane, bilateral: Secondary | ICD-10-CM | POA: Diagnosis not present

## 2018-07-07 DIAGNOSIS — H6123 Impacted cerumen, bilateral: Secondary | ICD-10-CM | POA: Diagnosis not present

## 2018-07-07 DIAGNOSIS — H7402 Tympanosclerosis, left ear: Secondary | ICD-10-CM | POA: Diagnosis not present

## 2018-07-15 ENCOUNTER — Other Ambulatory Visit: Payer: Self-pay | Admitting: Physician Assistant

## 2018-07-15 ENCOUNTER — Ambulatory Visit
Admission: RE | Admit: 2018-07-15 | Discharge: 2018-07-15 | Disposition: A | Discharge status: Home / Self Care | Payer: Medicare Other | Source: Ambulatory Visit | Attending: Physician Assistant | Admitting: Physician Assistant

## 2018-07-15 DIAGNOSIS — R197 Diarrhea, unspecified: Secondary | ICD-10-CM

## 2018-07-15 DIAGNOSIS — Z950 Presence of cardiac pacemaker: Secondary | ICD-10-CM | POA: Diagnosis not present

## 2018-07-15 DIAGNOSIS — I2510 Atherosclerotic heart disease of native coronary artery without angina pectoris: Secondary | ICD-10-CM | POA: Diagnosis not present

## 2018-07-20 ENCOUNTER — Other Ambulatory Visit: Payer: Self-pay | Admitting: Cardiology

## 2018-07-20 MED ORDER — CARVEDILOL 12.5 MG PO TABS: 12.5000 mg | ORAL_TABLET | Freq: Two times a day (BID) | ORAL | 0 refills | Status: DC

## 2018-07-20 NOTE — Telephone Encounter (Signed)
Has been give to Dr. Agustin Cree to be reviewed.

## 2018-07-20 NOTE — Telephone Encounter (Signed)
° ° ° °  1. Which medications need to be refilled? (please list name of each medication and dose if known) carvedilol 12.5mg  1BID  2. Which pharmacy/location (including street and city if local pharmacy) is medication to be sent to? [place] [facility]  3. Do they need a 30 day or 90 day supply? [place]

## 2018-07-20 NOTE — Telephone Encounter (Signed)
Refill okayed by Dr. Agustin Cree for Dr. Wynonia Lawman. Refill sent in.

## 2018-08-03 ENCOUNTER — Ambulatory Visit (INDEPENDENT_AMBULATORY_CARE_PROVIDER_SITE_OTHER): Payer: Medicare Other

## 2018-08-03 DIAGNOSIS — I442 Atrioventricular block, complete: Secondary | ICD-10-CM | POA: Diagnosis not present

## 2018-08-04 NOTE — Progress Notes (Signed)
Remote pacemaker transmission.   

## 2018-08-13 DIAGNOSIS — K5900 Constipation, unspecified: Secondary | ICD-10-CM | POA: Diagnosis not present

## 2018-09-14 DIAGNOSIS — K5900 Constipation, unspecified: Secondary | ICD-10-CM | POA: Diagnosis not present

## 2018-09-19 LAB — CUP PACEART REMOTE DEVICE CHECK
Battery Remaining Longevity: 113 mo
Battery Remaining Percentage: 95.5 %
Battery Voltage: 2.98 V
Brady Statistic AP VP Percent: 94 %
Brady Statistic AP VS Percent: 1.3 %
Brady Statistic AS VP Percent: 3.5 %
Brady Statistic AS VS Percent: 1 %
Brady Statistic RA Percent Paced: 93 %
Brady Statistic RV Percent Paced: 97 %
Date Time Interrogation Session: 20191209081125
Implantable Lead Implant Date: 20160329
Implantable Lead Implant Date: 20160329
Implantable Lead Location: 753859
Implantable Lead Location: 753860
Implantable Lead Model: 1948
Implantable Pulse Generator Implant Date: 20160329
Lead Channel Impedance Value: 430 Ohm
Lead Channel Impedance Value: 690 Ohm
Lead Channel Pacing Threshold Amplitude: 0.75 V
Lead Channel Pacing Threshold Amplitude: 0.75 V
Lead Channel Pacing Threshold Pulse Width: 0.4 ms
Lead Channel Pacing Threshold Pulse Width: 0.4 ms
Lead Channel Sensing Intrinsic Amplitude: 12 mV
Lead Channel Sensing Intrinsic Amplitude: 3.5 mV
Lead Channel Setting Pacing Amplitude: 1 V
Lead Channel Setting Pacing Amplitude: 2.5 V
Lead Channel Setting Pacing Pulse Width: 0.4 ms
Lead Channel Setting Sensing Sensitivity: 2.5 mV
Pulse Gen Model: 2240
Pulse Gen Serial Number: 7732903

## 2018-09-21 DIAGNOSIS — R69 Illness, unspecified: Secondary | ICD-10-CM | POA: Diagnosis not present

## 2018-09-29 ENCOUNTER — Encounter: Payer: Self-pay | Admitting: Cardiology

## 2018-09-29 ENCOUNTER — Ambulatory Visit: Payer: Medicare HMO | Admitting: Cardiology

## 2018-09-29 VITALS — BP 126/70 | HR 70 | Ht 69.0 in | Wt 170.1 lb

## 2018-09-29 DIAGNOSIS — I10 Essential (primary) hypertension: Secondary | ICD-10-CM | POA: Diagnosis not present

## 2018-09-29 DIAGNOSIS — I480 Paroxysmal atrial fibrillation: Secondary | ICD-10-CM | POA: Diagnosis not present

## 2018-09-29 DIAGNOSIS — R0602 Shortness of breath: Secondary | ICD-10-CM

## 2018-09-29 NOTE — Patient Instructions (Signed)
Medication Instructions:  The current medical regimen is effective;  continue present plan and medications.  If you need a refill on your cardiac medications before your next appointment, please call your pharmacy.   Testing/Procedures: Your physician has requested that you have an echocardiogram. Echocardiography is a painless test that uses sound waves to create images of your heart. It provides your doctor with information about the size and shape of your heart and how well your heart's chambers and valves are working. This procedure takes approximately one hour. There are no restrictions for this procedure.  Follow-Up: At [facility], you and your health needs are our priority.  As part of our continuing mission to provide you with exceptional heart care, we have created designated Provider Care Teams.  These Care Teams include your primary Cardiologist (physician) and Advanced Practice Providers (APPs -  Physician Assistants and Nurse Practitioners) who all work together to provide you with the care you need, when you need it. You will need a follow up appointment in 12 months.  Please call our office 2 months in advance to schedule this appointment.  You may see Mark Skains, MD or one of the following Advanced Practice Providers on your designated Care Team:   Lori Gerhardt, NP Laura Ingold, NP . Jill McDaniel, NP  Thank you for choosing [facility]!!     

## 2018-09-29 NOTE — Progress Notes (Signed)
Cardiology Office Note:    Date:  09/29/2018   ID:  Andrew Marshall, DOB December 03, 1930, MRN [id number]  PCP:  Seward Carol, MD  Cardiologist:  Candee Furbish, MD  Electrophysiologist:  None   Referring MD: Seward Carol, MD     History of Present Illness:    Andrew Marshall is a 85 y.o. male here for follow-up of pacemaker, dyspnea.  Prior ejection fraction around 40 to 45%.  No anginal symptoms.  Riding bike.  Still having some dyspnea.   Had a lateral wall MI in 2001 with mitral regurgitation and history of SVT arrhythmia.  Had a 3.5 x 12 mm stent to the proximal circumflex in 2001, brachii therapy for in-stent restenosis in 2002, 3.0 x 24 mm Cypher stent to second obtuse marginal covering the prior stents.  Cardiac catheterization 04/2015 with 2.5 x 12 mm Synergy stent placed to proximal circumflex, Dr. Tamala Julian.  [place] pacemaker implant 2016.  In January 2019 had another heart catheterization that showed normal left main, 30% stenosis of proximal LAD, 90% stenosis proximal circumflex, 30% stenosis of proximal RCA and a Synergy stent was delivered to the proximal circumflex artery.  Carotid Dopplers done in April 2017.  In review of Dr. Thurman Coyer prior notes, he had CAD with previous lateral infarct with stable CAD and dyspnea was thought to be likely multi-factorial.  He was trialed on low-dose amlodipine for short while to see if this helped his blood pressure.  He was getting short of breath when cycling up hills which is different for him.  [employer] offered to come to his house and put his bike on exercise or and make pacemaker adjustments. HR was increased to 70.   Still having some dyspnea.  Riding his bicycle frequently.  No chest pain.  No bleeding, no syncope.  LDL 59 triglycerides 66 serum creatinine 0.8, hemoglobin 13.2.  Past Medical History:  Diagnosis Date  . Allergic rhinitis   . Arthritis    "thumbs primarily" (05/05/2015)  . Basal cell carcinoma, face    "have had several; cut  and burned off"  . CAD (coronary artery disease)    a. 2001 s/p post MI- multiple PCI's to LCX/OM;  b. 2003 DES to OM;  c. 05/2012 MV: postlat infarct w/ mild peri-infarct ischemia. nl EF->Med Rx.  . Complete heart block ([place])    St. Jude pacemaker implanted 11/22/14  . ED (erectile dysfunction)   . Esophageal reflux    "slight"  . Essential hypertension, benign   . History of atrial fibrillation   . History of GI diverticular bleed    a. 05/2013 and 02/2014 - taken off of ASA for this reason.  . Metatarsalgia   . MI (myocardial infarction) ([place]) 2001  . Moderate mitral insufficiency    a. 10/2014 Echo: EF 55-60%, Gr1 DD, mild AS, mod MR, sev dil LA, nl RV, mildly dil RA, mild TR, PASP 45mmHg.  Marland Kitchen Pes planus   . Presence of permanent cardiac pacemaker   . 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 Prostate cancer ([place]) 2008   S/P "radiation and seed implants"  . Pure hypercholesterolemia   . RLS (restless legs syndrome)     Past Surgical History:  Procedure Laterality Date  . APPENDECTOMY  1956  . BASAL CELL CARCINOMA EXCISION     "face"  . CARDIAC CATHETERIZATION N/A 05/05/2015   Procedure: Left Heart Cath and Coronary Angiography;  Surgeon: Belva Crome, MD;  Location: [facility]  INVASIVE CV LAB;  Service: Cardiovascular;  Laterality: N/A;  . CARDIAC CATHETERIZATION N/A 05/05/2015   Procedure: Coronary Stent Intervention;  Surgeon: Belva Crome, MD;  Location: [place] CV LAB;  Service: Cardiovascular;  Laterality: N/A;  . CATARACT EXTRACTION W/ INTRAOCULAR LENS IMPLANT Left 12/2014  . COLONOSCOPY W/ POLYPECTOMY    . CORONARY ANGIOPLASTY    . CORONARY ANGIOPLASTY WITH STENT PLACEMENT  04/2000; ? ~ 2002; 01/2002  . ESTERNAL BEAM XRT AND SEEDS FOR PROSTATE CANCER    . EYE SURGERY Right 07/2016  . FOOT ARTHRODESIS, TRIPLE Right 2007  . GANGLION CYST EXCISION Right 1970's   wrist  . INSERT / REPLACE / REMOVE PACEMAKER  2017  . LEFT HEART CATH AND CORONARY ANGIOGRAPHY N/A  09/19/2017   Procedure: LEFT HEART CATH AND CORONARY ANGIOGRAPHY;  Surgeon: Burnell Blanks, MD;  Location: [place] CV LAB;  Service: Cardiovascular;  Laterality: N/A;  . LUMBAR LAMINECTOMY/DECOMPRESSION MICRODISCECTOMY Left 09/18/2016   Procedure: LEFT LUMBAR THREE- LUMBAR FOUR LUMBAR LAMINOTOMY AND MICRODISCECTOMY;  Surgeon: Jovita Gamma, MD;  Location: [place];  Service: Neurosurgery;  Laterality: Left;  LEFT LUMBAR 3- LUMBAR 4 LUMBAR LAMINOTOMY AND MICRODISCECTOMY  . PERMANENT PACEMAKER INSERTION N/A 11/22/2014   Procedure: PERMANENT PACEMAKER INSERTION;  Surgeon: [name], MD;  [facility] CATH LAB;  Service: Cardiovascular;  Laterality: N/A;  . PROSTATE BIOPSY    . TONSILLECTOMY  1942    Current Medications: Current Meds  Medication Sig  . aspirin EC 81 MG tablet Take 1 tablet (81 mg total) by mouth daily.  Marland Kitchen atorvastatin (LIPITOR) 40 MG tablet TAKE ONE TABLET BY MOUTH DAILY  . carvedilol (COREG) 12.5 MG tablet Take 1 tablet (12.5 mg total) by mouth 2 (two) times daily with a meal.  . cetirizine (ZYRTEC) 10 MG tablet Take 10 mg by mouth daily.   . clopidogrel (PLAVIX) 75 MG tablet TAKE ONE TABLET BY MOUTH DAILY, NEEDS APPOINTMENT  . Naphazoline HCl (CLEAR EYES OP) Place 1 drop into both eyes daily as needed (for dry eyes).  . nitroGLYCERIN (NITROSTAT) 0.4 MG SL tablet Place 1 tablet (0.4 mg total) under the tongue every 5 (five) minutes as needed for chest pain (MAX 3 TABLETS).  . sildenafil (VIAGRA) 100 MG tablet Take 100 mg by mouth daily as needed for erectile dysfunction.   . valsartan (DIOVAN) 320 MG tablet Take 320 mg by mouth daily.     Allergies:   Ambien [zolpidem] and Erythromycin   Social History   Socioeconomic History  . Marital status: Married    Spouse name: Not on file  . Number of children: Not on file  . Years of education: Not on file  . Highest education level: Not on file  Occupational History  . Not on file  Social Needs  . Financial  resource strain: Not on file  . Food insecurity:    Worry: Not on file    Inability: Not on file  . Transportation needs:    Medical: Not on file    Non-medical: Not on file  Tobacco Use  . Smoking status: Former Smoker    Packs/day: 0.50    Years: 50.00    Pack years: 25.00    Types: Cigarettes    Last attempt to quit: 05/22/2000    Years since quitting: 18.3  . Smokeless tobacco: Never Used  Substance and Sexual Activity  . Alcohol use: Yes    Comment: 05/05/2015 "3-6 glasses of wine/day"  . Drug use: No  .  Sexual activity: Yes  Lifestyle  . Physical activity:    Days per week: Not on file    Minutes per session: Not on file  . Stress: Not on file  Relationships  . Social connections:    Talks on phone: Not on file    Gets together: Not on file    Attends religious service: Not on file    Active member of club or organization: Not on file    Attends meetings of clubs or organizations: Not on file    Relationship status: Not on file  Other Topics Concern  . Not on file  Social History Narrative  . Not on file     Family History: The patient's family history includes Breast cancer in his mother; Heart disease in his father.  ROS:   Please see the history of present illness.   Occasional skipped beats shortness of breath with activity  All other systems reviewed and are negative.  EKGs/Labs/Other Studies Reviewed:    The following studies were reviewed today:   EKG:  EKG is  ordered today.  The ekg ordered today demonstrates AV pacing 70.  Rare intrinsic beats noted  Recent Labs: No results found for requested labs within last 8760 hours.  Recent Lipid Panel    Component Value Date/Time   CHOL 147 04/12/2014 0951   TRIG 109.0 04/12/2014 0951   HDL 44.80 04/12/2014 0951   CHOLHDL 3 04/12/2014 0951   VLDL 21.8 04/12/2014 0951   LDLCALC 80 04/12/2014 0951    Physical Exam:    VS:  BP 126/70   Pulse 70   Ht 5\' 9"  (1.753 m)   Wt 170 lb 1.9 oz (77.2 kg)    BMI 25.12 kg/m     Wt Readings from Last 3 Encounters:  09/29/18 170 lb 1.9 oz (77.2 kg)  10/09/17 179 lb (81.2 kg)  09/19/17 170 lb (77.1 kg)     GEN:  Well nourished, well developed in no acute distress HEENT: Normal NECK: No JVD; No carotid bruits LYMPHATICS: No lymphadenopathy CARDIAC: RRR, no murmurs, rubs, gallops RESPIRATORY:  Clear to auscultation without rales, wheezing or rhonchi  ABDOMEN: Soft, non-tender, non-distended MUSCULOSKELETAL:  No edema; No deformity  SKIN: Warm and dry NEUROLOGIC:  Alert and oriented x 3 PSYCHIATRIC:  Normal affect   ASSESSMENT:    1. SOB (shortness of breath)   2. Essential hypertension   3. Paroxysmal atrial fibrillation (HCC)    PLAN:    In order of problems listed above:  Dyspnea on exertion -We will check an echocardiogram.  I am impressed that his cycling.  Explained to him some of the physiologic differences between paced beats and non-paced beats.  Question conditioning  Coronary artery disease - Has residual obtuse marginal ostial stenosis however this is a jailed vessel, would not pursue PCI.  Continue with aggressive medical management.  Certainly could be playing some role in his shortness of breath.  Pacemaker - [facility] Jude, Dr. [name], stable.  1 year follow up.   Medication Adjustments/Labs and Tests Ordered: Current medicines are reviewed at length with the patient today.  Concerns regarding medicines are outlined above.  Orders Placed This Encounter  Procedures  . EKG 12-Lead  . ECHOCARDIOGRAM COMPLETE   No orders of the defined types were placed in this encounter.   Patient Instructions  Medication Instructions:  The current medical regimen is effective;  continue present plan and medications.  If you need a refill on your cardiac medications  before your next appointment, please call your pharmacy.   Testing/Procedures: Your physician has requested that you have an echocardiogram. Echocardiography is  a painless test that uses sound waves to create images of your heart. It provides your doctor with information about the size and shape of your heart and how well your heart's chambers and valves are working. This procedure takes approximately one hour. There are no restrictions for this procedure.  Follow-Up: At [facility], you and your health needs are our priority.  As part of our continuing mission to provide you with exceptional heart care, we have created designated Provider Care Teams.  These Care Teams include your primary Cardiologist (physician) and Advanced Practice Providers (APPs -  Physician Assistants and Nurse Practitioners) who all work together to provide you with the care you need, when you need it. You will need a follow up appointment in 12 months.  Please call our office 2 months in advance to schedule this appointment.  You may see Candee Furbish, MD or one of the following Advanced Practice Providers on your designated Care Team:   Truitt Merle, NP Cecilie Kicks, NP . Kathyrn Drown, NP  Thank you for choosing [facility]!!         Signed, Candee Furbish, MD  09/29/2018 4:58 PM    [place]

## 2018-10-08 ENCOUNTER — Ambulatory Visit (HOSPITAL_COMMUNITY): Payer: Medicare HMO | Attending: Cardiology

## 2018-10-08 DIAGNOSIS — R0602 Shortness of breath: Secondary | ICD-10-CM | POA: Diagnosis not present

## 2018-10-08 DIAGNOSIS — I10 Essential (primary) hypertension: Secondary | ICD-10-CM | POA: Diagnosis not present

## 2018-10-09 ENCOUNTER — Telehealth: Payer: Self-pay

## 2018-10-09 DIAGNOSIS — R931 Abnormal findings on diagnostic imaging of heart and coronary circulation: Secondary | ICD-10-CM

## 2018-10-09 MED ORDER — SACUBITRIL-VALSARTAN 49-51 MG PO TABS
1.0000 | ORAL_TABLET | Freq: Two times a day (BID) | ORAL | 11 refills | Status: DC
Start: 2018-10-09 — End: 2019-06-07

## 2018-10-09 NOTE — Telephone Encounter (Signed)
Spoke with pt regarding his echo results and recommendations. He has agreed to stop Valsartan and begin Entresto 49/61 bid. He will have a repeat BMP drawn when he is in the office to see Dr [name] next week. He also agrees to a follow up appt with Dr Marlou Porch on 3/10.   Pt was offered a 30 day sample card. He states he will pick it up next week when he comes in for his OV with Dr [name].   He has verbalized understanding and had no additional questions.

## 2018-10-09 NOTE — Telephone Encounter (Signed)
-----   Message from Jerline Pain, MD sent at 10/08/2018  7:55 PM EST ----- His ejection fraction is in the 35% range which is mildly reduced from 40-45% previously. -I would like to start Entresto 49/61 mg p.o. twice daily (moderate dose because of higher dose of valsartan 320) -Stop valsartan 320  Have him come in for a basic metabolic profile in 1 week. Have him come in for follow-up with me or APP in 2 to 4 weeks  Andrew Furbish, MD

## 2018-10-14 ENCOUNTER — Other Ambulatory Visit: Payer: Self-pay | Admitting: Internal Medicine

## 2018-10-14 ENCOUNTER — Ambulatory Visit (INDEPENDENT_AMBULATORY_CARE_PROVIDER_SITE_OTHER): Payer: Medicare HMO | Admitting: Internal Medicine

## 2018-10-14 ENCOUNTER — Other Ambulatory Visit: Payer: Medicare HMO

## 2018-10-14 ENCOUNTER — Encounter: Payer: Self-pay | Admitting: Internal Medicine

## 2018-10-14 VITALS — BP 120/60 | HR 80 | Ht 69.0 in | Wt 166.0 lb

## 2018-10-14 DIAGNOSIS — R931 Abnormal findings on diagnostic imaging of heart and coronary circulation: Secondary | ICD-10-CM | POA: Diagnosis not present

## 2018-10-14 DIAGNOSIS — I10 Essential (primary) hypertension: Secondary | ICD-10-CM

## 2018-10-14 DIAGNOSIS — I480 Paroxysmal atrial fibrillation: Secondary | ICD-10-CM | POA: Diagnosis not present

## 2018-10-14 DIAGNOSIS — I442 Atrioventricular block, complete: Secondary | ICD-10-CM | POA: Diagnosis not present

## 2018-10-14 NOTE — Progress Notes (Signed)
PCP: Seward Carol, MD Primary Cardiologist: previously Dr Wynonia Lawman, now [facility] Primary EP:  Dr Dema Severin Andrew Marshall is a 85 y.o. male who presents today for routine electrophysiology followup.  Since last being seen in our clinic, the patient reports doing very well. He continues to ride his bike. Today, he denies symptoms of palpitations, chest pain, shortness of breath,  lower extremity edema, dizziness, presyncope, or syncope.  The patient is otherwise without complaint today.   Past Medical History:  Diagnosis Date  . Allergic rhinitis   . Arthritis    "thumbs primarily" (05/05/2015)  . Basal cell carcinoma, face    "have had several; cut and burned off"  . CAD (coronary artery disease)    a. 2001 s/p post MI- multiple PCI's to LCX/OM;  b. 2003 DES to OM;  c. 05/2012 MV: postlat infarct w/ mild peri-infarct ischemia. nl EF->Med Rx.  . Complete heart block ([place])    St. Jude pacemaker implanted 11/22/14  . ED (erectile dysfunction)   . Esophageal reflux    "slight"  . Essential hypertension, benign   . History of atrial fibrillation   . History of GI diverticular bleed    a. 05/2013 and 02/2014 - taken off of ASA for this reason.  . Metatarsalgia   . MI (myocardial infarction) ([place]) 2001  . Moderate mitral insufficiency    a. 10/2014 Echo: EF 55-60%, Gr1 DD, mild AS, mod MR, sev dil LA, nl RV, mildly dil RA, mild TR, PASP 56mmHg.  Marland Kitchen Pes planus   . Presence of permanent cardiac pacemaker   . 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 Prostate cancer ([place]) 2008   S/P "radiation and seed implants"  . Pure hypercholesterolemia   . RLS (restless legs syndrome)    Past Surgical History:  Procedure Laterality Date  . APPENDECTOMY  1956  . BASAL CELL CARCINOMA EXCISION     "face"  . CARDIAC CATHETERIZATION N/A 05/05/2015   Procedure: Left Heart Cath and Coronary Angiography;  Surgeon: Belva Crome, MD;  Location: [place] CV LAB;  Service: Cardiovascular;   Laterality: N/A;  . CARDIAC CATHETERIZATION N/A 05/05/2015   Procedure: Coronary Stent Intervention;  Surgeon: Belva Crome, MD;  Location: [place] CV LAB;  Service: Cardiovascular;  Laterality: N/A;  . CATARACT EXTRACTION W/ INTRAOCULAR LENS IMPLANT Left 12/2014  . COLONOSCOPY W/ POLYPECTOMY    . CORONARY ANGIOPLASTY    . CORONARY ANGIOPLASTY WITH STENT PLACEMENT  04/2000; ? ~ 2002; 01/2002  . ESTERNAL BEAM XRT AND SEEDS FOR PROSTATE CANCER    . EYE SURGERY Right 07/2016  . FOOT ARTHRODESIS, TRIPLE Right 2007  . GANGLION CYST EXCISION Right 1970's   wrist  . INSERT / REPLACE / REMOVE PACEMAKER  2017  . LEFT HEART CATH AND CORONARY ANGIOGRAPHY N/A 09/19/2017   Procedure: LEFT HEART CATH AND CORONARY ANGIOGRAPHY;  Surgeon: Burnell Blanks, MD;  Location: [place] CV LAB;  Service: Cardiovascular;  Laterality: N/A;  . LUMBAR LAMINECTOMY/DECOMPRESSION MICRODISCECTOMY Left 09/18/2016   Procedure: LEFT LUMBAR THREE- LUMBAR FOUR LUMBAR LAMINOTOMY AND MICRODISCECTOMY;  Surgeon: Jovita Gamma, MD;  Location: [place];  Service: Neurosurgery;  Laterality: Left;  LEFT LUMBAR 3- LUMBAR 4 LUMBAR LAMINOTOMY AND MICRODISCECTOMY  . PERMANENT PACEMAKER INSERTION N/A 11/22/2014   Procedure: PERMANENT PACEMAKER INSERTION;  Surgeon: [name], MD;  Location: [facility] CATH LAB;  Service: Cardiovascular;  Laterality: N/A;  . PROSTATE BIOPSY    . TONSILLECTOMY  1942  ROS- all systems are reviewed and negative except as per HPI above  Current Outpatient Medications  Medication Sig Dispense Refill  . aspirin EC 81 MG tablet Take 1 tablet (81 mg total) by mouth daily.    Marland Kitchen atorvastatin (LIPITOR) 40 MG tablet TAKE ONE TABLET BY MOUTH DAILY 90 tablet 3  . carvedilol (COREG) 12.5 MG tablet Take 1 tablet (12.5 mg total) by mouth 2 (two) times daily with a meal. 180 tablet 0  . cetirizine (ZYRTEC) 10 MG tablet Take 10 mg by mouth daily.     . clopidogrel (PLAVIX) 75 MG tablet TAKE ONE TABLET BY MOUTH DAILY   NEEDS APPY 90 tablet 3  . Naphazoline HCl (CLEAR EYES OP) Place 1 drop into both eyes daily as needed (for dry eyes).    . nitroGLYCERIN (NITROSTAT) 0.4 MG SL tablet Place 1 tablet (0.4 mg total) under the tongue every 5 (five) minutes as needed for chest pain (MAX 3 TABLETS). 25 tablet 3  . sacubitril-valsartan (ENTRESTO) 49-51 MG Take 1 tablet by mouth 2 (two) times daily. 60 tablet 11  . sildenafil (VIAGRA) 100 MG tablet Take 100 mg by mouth daily as needed for erectile dysfunction.      No current facility-administered medications for this visit.     Physical Exam: Vitals:   10/14/18 1644  BP: 120/60  Pulse: 80  SpO2: 97%  Weight: 166 lb (75.3 kg)  Height: 5\' 9"  (1.753 m)    GEN- The patient is well appearing, alert and oriented x 3 today.   Head- normocephalic, atraumatic Eyes-  Sclera clear, conjunctiva pink Ears- hearing intact Oropharynx- clear Lungs- Clear to ausculation bilaterally, normal work of breathing Chest- pacemaker pocket is well healed Heart- Regular rate and rhythm, no murmurs, rubs or gallops, PMI not laterally displaced GI- soft, NT, ND, + BS Extremities- no clubbing, cyanosis, or edema  Pacemaker interrogation- reviewed in detail today,  See PACEART report    Assessment and Plan:  1. Symptomatic sinus bradycardia and complete heart block Normal pacemaker function See Pace Art report No changes today  2. afib afib burdne is 0 % for several years Wishes to avoid anticoagulation We may reconsider if AF burden increases  3. HTN Stable No change required today  4. Nonischemic CM EF 35% Recently started on entresto Continue medical management Given advanced age I would not advise CRT upgrade at this time Continue conservative measures long term   Merlin Return to see me in a year Follow-up with Dr Marlou Porch as scheduled  [name] MD, [facility] 10/14/2018 4:59 PM

## 2018-10-14 NOTE — Patient Instructions (Signed)
Medication Instructions:  Your physician recommends that you continue on your current medications as directed. Please refer to the Current Medication list given to you today.  *If you need a refill on your cardiac medications before your next appointment, please call your pharmacy*  Labwork: None ordered  Testing/Procedures: None ordered  Follow-Up: Remote monitoring is used to monitor your Pacemaker or ICD from home. This monitoring reduces the number of office visits required to check your device to one time per year. It allows [place] to keep an eye on the functioning of your device to ensure it is working properly. You are scheduled for a device check from home on 11/02/18. You may send your transmission at any time that day. If you have a wireless device, the transmission will be sent automatically. After your physician reviews your transmission, you will receive a postcard with your next transmission date.  Your physician wants you to follow-up in: 1 year with Dr. [name].  You will receive a reminder letter in the mail two months in advance. If you don't receive a letter, please call our office to schedule the follow-up appointment.  Thank you for choosing [facility]!!

## 2018-10-15 ENCOUNTER — Other Ambulatory Visit: Payer: Self-pay | Admitting: Cardiology

## 2018-10-15 LAB — BASIC METABOLIC PANEL
BUN/Creatinine Ratio: 30 — ABNORMAL HIGH (ref 10–24)
BUN: 28 mg/dL — ABNORMAL HIGH (ref 8–27)
CO2: 24 mmol/L (ref 20–29)
Calcium: 9.2 mg/dL (ref 8.6–10.2)
Chloride: 103 mmol/L (ref 96–106)
Creatinine, Ser: 0.94 mg/dL (ref 0.76–1.27)
GFR calc Af Amer: 85 mL/min/{1.73_m2} (ref >59)
GFR calc non Af Amer: 74 mL/min/{1.73_m2} (ref >59)
Glucose: 141 mg/dL — ABNORMAL HIGH (ref 65–99)
Potassium: 4.8 mmol/L (ref 3.5–5.2)
Sodium: 140 mmol/L (ref 134–144)

## 2018-10-16 LAB — CUP PACEART INCLINIC DEVICE CHECK
Battery Remaining Longevity: 115 mo
Battery Voltage: 2.98 V
Brady Statistic RA Percent Paced: 90 %
Brady Statistic RV Percent Paced: 95 %
Date Time Interrogation Session: 20200219223914
Implantable Lead Implant Date: 20160329
Implantable Lead Implant Date: 20160329
Implantable Lead Location: 753859
Implantable Lead Location: 753860
Implantable Lead Model: 1948
Implantable Pulse Generator Implant Date: 20160329
Lead Channel Impedance Value: 425 Ohm
Lead Channel Impedance Value: 700 Ohm
Lead Channel Pacing Threshold Amplitude: 0.5 V
Lead Channel Pacing Threshold Amplitude: 0.625 V
Lead Channel Pacing Threshold Pulse Width: 0.4 ms
Lead Channel Pacing Threshold Pulse Width: 0.4 ms
Lead Channel Sensing Intrinsic Amplitude: 12 mV
Lead Channel Sensing Intrinsic Amplitude: 3.4 mV
Lead Channel Setting Pacing Amplitude: 0.875
Lead Channel Setting Pacing Amplitude: 1.5 V
Lead Channel Setting Pacing Pulse Width: 0.4 ms
Lead Channel Setting Sensing Sensitivity: 2.5 mV
Pulse Gen Model: 2240
Pulse Gen Serial Number: 7732903

## 2018-10-29 DIAGNOSIS — Z85828 Personal history of other malignant neoplasm of skin: Secondary | ICD-10-CM | POA: Diagnosis not present

## 2018-10-29 DIAGNOSIS — L309 Dermatitis, unspecified: Secondary | ICD-10-CM | POA: Diagnosis not present

## 2018-11-02 ENCOUNTER — Ambulatory Visit (INDEPENDENT_AMBULATORY_CARE_PROVIDER_SITE_OTHER): Payer: Medicare HMO | Admitting: *Deleted

## 2018-11-02 DIAGNOSIS — I480 Paroxysmal atrial fibrillation: Secondary | ICD-10-CM

## 2018-11-02 DIAGNOSIS — I442 Atrioventricular block, complete: Secondary | ICD-10-CM | POA: Diagnosis not present

## 2018-11-03 ENCOUNTER — Ambulatory Visit: Payer: Medicare HMO | Admitting: Cardiology

## 2018-11-03 ENCOUNTER — Encounter: Payer: Self-pay | Admitting: Cardiology

## 2018-11-03 VITALS — BP 106/60 | HR 64 | Ht 69.0 in | Wt 169.6 lb

## 2018-11-03 DIAGNOSIS — R931 Abnormal findings on diagnostic imaging of heart and coronary circulation: Secondary | ICD-10-CM | POA: Diagnosis not present

## 2018-11-03 DIAGNOSIS — I255 Ischemic cardiomyopathy: Secondary | ICD-10-CM

## 2018-11-03 DIAGNOSIS — I5022 Chronic systolic (congestive) heart failure: Secondary | ICD-10-CM | POA: Diagnosis not present

## 2018-11-03 LAB — CUP PACEART REMOTE DEVICE CHECK
Battery Remaining Longevity: 124 mo
Battery Remaining Percentage: 95.5 %
Battery Voltage: 2.98 V
Brady Statistic AP VP Percent: 84 %
Brady Statistic AP VS Percent: 3.8 %
Brady Statistic AS VP Percent: 7 %
Brady Statistic AS VS Percent: 1 %
Brady Statistic RA Percent Paced: 83 %
Brady Statistic RV Percent Paced: 91 %
Date Time Interrogation Session: 20200309084916
Implantable Lead Implant Date: 20160329
Implantable Lead Implant Date: 20160329
Implantable Lead Location: 753859
Implantable Lead Location: 753860
Implantable Lead Model: 1948
Implantable Pulse Generator Implant Date: 20160329
Lead Channel Impedance Value: 430 Ohm
Lead Channel Impedance Value: 700 Ohm
Lead Channel Pacing Threshold Amplitude: 0.5 V
Lead Channel Pacing Threshold Amplitude: 0.75 V
Lead Channel Pacing Threshold Pulse Width: 0.4 ms
Lead Channel Pacing Threshold Pulse Width: 0.4 ms
Lead Channel Sensing Intrinsic Amplitude: 12 mV
Lead Channel Sensing Intrinsic Amplitude: 4 mV
Lead Channel Setting Pacing Amplitude: 1 V
Lead Channel Setting Pacing Amplitude: 1.5 V
Lead Channel Setting Pacing Pulse Width: 0.4 ms
Lead Channel Setting Sensing Sensitivity: 2.5 mV
Pulse Gen Model: 2240
Pulse Gen Serial Number: 7732903

## 2018-11-03 NOTE — Progress Notes (Signed)
Cardiology Office Note:    Date:  11/03/2018   ID:  Andrew Marshall, DOB 16-Apr-1931, MRN [id number]  PCP:  Andrew Carol, MD  Cardiologist:  Candee Furbish, MD  Electrophysiologist:  None   Referring MD: Andrew Carol, MD     History of Present Illness:    Andrew Marshall is a 85 y.o. male here for follow-up of pacemaker, dyspnea.  Prior ejection fraction around 40 to 45%.  No anginal symptoms.  Riding bike.  Still having some dyspnea.   Had a lateral wall MI in 2001 with mitral regurgitation and history of SVT arrhythmia.  Had a 3.5 x 12 mm stent to the proximal circumflex in 2001, brachii therapy for in-stent restenosis in 2002, 3.0 x 24 mm Cypher stent to second obtuse marginal covering the prior stents.  Cardiac catheterization 04/2015 with 2.5 x 12 mm Synergy stent placed to proximal circumflex, Dr. Tamala Julian.  [facility]inai pacemaker implant 2016.  In January 2019 had another heart catheterization that showed normal left main, 30% stenosis of proximal LAD, 90% stenosis proximal circumflex, 30% stenosis of proximal RCA and a Synergy stent was delivered to the proximal circumflex artery.  Carotid Dopplers done in April 2017.  In review of Dr. Thurman Coyer prior notes, he had CAD with previous lateral infarct with stable CAD and dyspnea was thought to be likely multi-factorial.  He was trialed on low-dose amlodipine for short while to see if this helped his blood pressure.  He was getting short of breath when cycling up hills which is different for him.  [facility] offered to come to his house and put his bike on exercise or and make pacemaker adjustments. HR was increased to 70.   Still having some dyspnea.  Riding his bicycle frequently.  No chest pain.  No bleeding, no syncope.  LDL 59 triglycerides 66 serum creatinine 0.8, hemoglobin 13.2.  11/03/2018-here for the follow-up of ischemic cardiomyopathy.  New EF 35% on echocardiogram down from 45%.  New start Entresto moderate dose.  Overall still feeling  some shortness of breath with heavy physical exertion but still is very active, biking etc.  No syncope.  Past Medical History:  Diagnosis Date  . Allergic rhinitis   . Arthritis    "thumbs primarily" (05/05/2015)  . Basal cell carcinoma, face    "have had several; cut and burned off"  . CAD (coronary artery disease)    a. 2001 s/p post MI- multiple PCI's to LCX/OM;  b. 2003 DES to OM;  c. 05/2012 MV: postlat infarct w/ mild peri-infarct ischemia. nl EF->Med Rx.  . Complete heart block ([place])    [employer] pacemaker implanted 11/22/14  . ED (erectile dysfunction)   . Esophageal reflux    "slight"  . Essential hypertension, benign   . History of atrial fibrillation   . History of GI diverticular bleed    a. 05/2013 and 02/2014 - taken off of ASA for this reason.  . Metatarsalgia   . MI (myocardial infarction) ([place]) 2001  . Moderate mitral insufficiency    a. 10/2014 Echo: EF 55-60%, Gr1 DD, mild AS, mod MR, sev dil LA, nl RV, mildly dil RA, mild TR, PASP 77mmHg.  Marland Kitchen Pes planus   . Presence of permanent cardiac pacemaker   . 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 Prostate cancer ([place]) 2008   S/P "radiation and seed implants"  . Pure hypercholesterolemia   . RLS (restless legs syndrome)     Past Surgical  History:  Procedure Laterality Date  . APPENDECTOMY  1956  . BASAL CELL CARCINOMA EXCISION     "face"  . CARDIAC CATHETERIZATION N/A 05/05/2015   Procedure: Left Heart Cath and Coronary Angiography;  Surgeon: Belva Crome, MD;  Location: [facility];  Service: Cardiovascular;  Laterality: N/A;  . CARDIAC CATHETERIZATION N/A 05/05/2015   Procedure: Coronary Stent Intervention;  Surgeon: Belva Crome, MD;  Location: [facility];  Service: Cardiovascular;  Laterality: N/A;  . CATARACT EXTRACTION W/ INTRAOCULAR LENS IMPLANT Left 12/2014  . COLONOSCOPY W/ POLYPECTOMY    . CORONARY ANGIOPLASTY    . CORONARY ANGIOPLASTY WITH STENT PLACEMENT  04/2000; ? ~ 2002;  01/2002  . ESTERNAL BEAM XRT AND SEEDS FOR PROSTATE CANCER    . EYE SURGERY Right 07/2016  . FOOT ARTHRODESIS, TRIPLE Right 2007  . GANGLION CYST EXCISION Right 1970's   wrist  . INSERT / REPLACE / REMOVE PACEMAKER  2017  . LEFT HEART CATH AND CORONARY ANGIOGRAPHY N/A 09/19/2017   Procedure: LEFT HEART CATH AND CORONARY ANGIOGRAPHY;  Surgeon: Burnell Blanks, MD;  Location: [facility];  Service: Cardiovascular;  Laterality: N/A;  . LUMBAR LAMINECTOMY/DECOMPRESSION MICRODISCECTOMY Left 09/18/2016   Procedure: LEFT LUMBAR THREE- LUMBAR FOUR LUMBAR LAMINOTOMY AND MICRODISCECTOMY;  Surgeon: Jovita Gamma, MD;  Location: [facility];  Service: Neurosurgery;  Laterality: Left;  LEFT LUMBAR 3- LUMBAR 4 LUMBAR LAMINOTOMY AND MICRODISCECTOMY  . PERMANENT PACEMAKER INSERTION N/A 11/22/2014   Procedure: PERMANENT PACEMAKER INSERTION;  Surgeon: [name], MD;  Location: [facility];  Service: Cardiovascular;  Laterality: N/A;  . PROSTATE BIOPSY    . TONSILLECTOMY  1942    Current Medications: Current Meds  Medication Sig  . aspirin EC 81 MG tablet Take 1 tablet (81 mg total) by mouth daily.  Marland Kitchen atorvastatin (LIPITOR) 40 MG tablet TAKE ONE TABLET BY MOUTH DAILY  . carvedilol (COREG) 12.5 MG tablet TAKE ONE TABLET BY MOUTH TWICE A DAY WITH MEAL  . cetirizine (ZYRTEC) 10 MG tablet Take 10 mg by mouth daily.   . clopidogrel (PLAVIX) 75 MG tablet TAKE ONE TABLET BY MOUTH DAILY  NEEDS APPY  . Naphazoline HCl (CLEAR EYES OP) Place 1 drop into both eyes daily as needed (for dry eyes).  . nitroGLYCERIN (NITROSTAT) 0.4 MG SL tablet Place 1 tablet (0.4 mg total) under the tongue every 5 (five) minutes as needed for chest pain (MAX 3 TABLETS).  . sacubitril-valsartan (ENTRESTO) 49-51 MG Take 1 tablet by mouth 2 (two) times daily.  . sildenafil (VIAGRA) 100 MG tablet Take 100 mg by mouth daily as needed for erectile dysfunction.      Allergies:   Ambien [zolpidem] and Erythromycin   Social History     Socioeconomic History  . Marital status: Married    Spouse name: Not on file  . Number of children: Not on file  . Years of education: Not on file  . Highest education level: Not on file  Occupational History  . Not on file  Social Needs  . Financial resource strain: Not on file  . Food insecurity:    Worry: Not on file    Inability: Not on file  . Transportation needs:    Medical: Not on file    Non-medical: Not on file  Tobacco Use  . Smoking status: Former Smoker    Packs/day: 0.50    Years: 50.00    Pack years: 25.00    Types: Cigarettes  Last attempt to quit: 05/22/2000    Years since quitting: 18.4  . Smokeless tobacco: Never Used  Substance and Sexual Activity  . Alcohol use: Yes    Comment: 05/05/2015 "3-6 glasses of wine/day"  . Drug use: No  . Sexual activity: Yes  Lifestyle  . Physical activity:    Days per week: Not on file    Minutes per session: Not on file  . Stress: Not on file  Relationships  . Social connections:    Talks on phone: Not on file    Gets together: Not on file    Attends religious service: Not on file    Active member of club or organization: Not on file    Attends meetings of clubs or organizations: Not on file    Relationship status: Not on file  Other Topics Concern  . Not on file  Social History Narrative  . Not on file     Family History: The patient's family history includes Breast cancer in his mother; Heart disease in his father.  ROS:   Please see the history of present illness.   Occasional skipped beats shortness of breath with activity  All other systems reviewed and are negative.  EKGs/Labs/Other Studies Reviewed:    The following studies were reviewed today:   EKG:  EKG is  ordered today.  The ekg ordered today demonstrates AV pacing 70.  Rare intrinsic beats noted  Recent Labs: 10/14/2018: BUN 28; Creatinine, Ser 0.94; Potassium 4.8; Sodium 140  Recent Lipid Panel    Component Value Date/Time   CHOL  147 04/12/2014 0951   TRIG 109.0 04/12/2014 0951   HDL 44.80 04/12/2014 0951   CHOLHDL 3 04/12/2014 0951   VLDL 21.8 04/12/2014 0951   LDLCALC 80 04/12/2014 0951    Physical Exam:    VS:  BP 106/60   Pulse 64   Ht 5\' 9"  (1.753 m)   Wt 169 lb 9.6 oz (76.9 kg)   SpO2 98%   BMI 25.05 kg/m     Wt Readings from Last 3 Encounters:  11/03/18 169 lb 9.6 oz (76.9 kg)  10/14/18 166 lb (75.3 kg)  09/29/18 170 lb 1.9 oz (77.2 kg)     GEN:  Well nourished, well developed in no acute distress HEENT: Normal NECK: No JVD; No carotid bruits LYMPHATICS: No lymphadenopathy CARDIAC: RRR, no murmurs, rubs, gallops RESPIRATORY:  Clear to auscultation without rales, wheezing or rhonchi  ABDOMEN: Soft, non-tender, non-distended MUSCULOSKELETAL:  No edema; No deformity  SKIN: Warm and dry NEUROLOGIC:  Alert and oriented x 3 PSYCHIATRIC:  Normal affect   ASSESSMENT:    1. Ischemic cardiomyopathy   2. Chronic systolic (congestive) heart failure ([place])   3. Decreased cardiac ejection fraction    PLAN:    In order of problems listed above:  Dyspnea on exertion -We will check an echocardiogram.  I am impressed that his cycling.  Explained to him some of the physiologic differences between paced beats and non-paced beats.  Question conditioning  Coronary artery disease - Has residual obtuse marginal ostial stenosis however this is a jailed vessel, would not pursue PCI.  Continue with aggressive medical management.  Certainly could be playing some role in his shortness of breath.  Pacemaker - [employer] [facility] [employer] Jude, Dr. [name], stable.  1 year follow up.   Medication Adjustments/Labs and Tests Ordered: Current medicines are reviewed at length with the patient today.  Concerns regarding medicines are outlined above.  Orders Placed This Encounter  Procedures  . ECHOCARDIOGRAM COMPLETE   No orders of the defined types were placed in this encounter.   Patient Instructions  Medication Instructions:   The current medical regimen is effective;  continue present plan and medications.  If you need a refill on your cardiac medications before your next appointment, please call your pharmacy.   Testing/Procedures: Your physician has requested that you have an echocardiogram in 6 months before seeing him back. Echocardiography is a painless test that uses sound waves to create images of your heart. It provides your doctor with information about the size and shape of your heart and how well your heart's chambers and valves are working. This procedure takes approximately one hour. There are no restrictions for this procedure.  Follow-Up: At [facility], you and your health needs are our priority.  As part of our continuing mission to provide you with exceptional heart care, we have created designated Provider Care Teams.  These Care Teams include your primary Cardiologist (physician) and Advanced Practice Providers (APPs -  Physician Assistants and Nurse Practitioners) who all work together to provide you with the care you need, when you need it. You will need a follow up appointment in 6 months.  Please call our office 2 months in advance to schedule this appointment.  You may see Candee Furbish, MD or one of the following Advanced Practice Providers on your designated Care Team:   Truitt Merle, NP Cecilie Kicks, NP . Kathyrn Drown, NP  Thank you for choosing [facility]!!        Signed, Candee Furbish, MD  11/03/2018 1:35 PM    [place]

## 2018-11-03 NOTE — Patient Instructions (Signed)
Medication Instructions:  The current medical regimen is effective;  continue present plan and medications.  If you need a refill on your cardiac medications before your next appointment, please call your pharmacy.   Testing/Procedures: Your physician has requested that you have an echocardiogram in 6 months before seeing him back. Echocardiography is a painless test that uses sound waves to create images of your heart. It provides your doctor with information about the size and shape of your heart and how well your heart's chambers and valves are working. This procedure takes approximately one hour. There are no restrictions for this procedure.  Follow-Up: At [facility], you and your health needs are our priority.  As part of our continuing mission to provide you with exceptional heart care, we have created designated Provider Care Teams.  These Care Teams include your primary Cardiologist (physician) and Advanced Practice Providers (APPs -  Physician Assistants and Nurse Practitioners) who all work together to provide you with the care you need, when you need it. You will need a follow up appointment in 6 months.  Please call our office 2 months in advance to schedule this appointment.  You may see Candee Furbish, MD or one of the following Advanced Practice Providers on your designated Care Team:   Truitt Merle, NP Cecilie Kicks, NP . Kathyrn Drown, NP  Thank you for choosing [facility]!!

## 2018-11-09 NOTE — Progress Notes (Signed)
Remote pacemaker transmission.   

## 2018-11-10 DIAGNOSIS — H60311 Diffuse otitis externa, right ear: Secondary | ICD-10-CM | POA: Diagnosis not present

## 2018-11-10 DIAGNOSIS — H7293 Unspecified perforation of tympanic membrane, bilateral: Secondary | ICD-10-CM | POA: Diagnosis not present

## 2019-01-15 ENCOUNTER — Other Ambulatory Visit: Payer: Self-pay | Admitting: Cardiology

## 2019-01-15 MED ORDER — ATORVASTATIN CALCIUM 40 MG PO TABS: 40.0000 mg | ORAL_TABLET | Freq: Every day | ORAL | 3 refills | Status: DC

## 2019-01-15 NOTE — Telephone Encounter (Signed)
Pt's medication was sent to pt's pharmacy as requested. Confirmation received.  °

## 2019-01-15 NOTE — Telephone Encounter (Signed)
°*  STAT* If patient is at the pharmacy, call can be transferred to refill team.   1. Which medications need to be refilled? (please list name of each medication and dose if known) atorvastatin (LIPITOR) 40 MG tablet   2. Which pharmacy/location (including street and city if local pharmacy) is medication to be sent to?  [facility] [address], [place] [phone] (Phone) [phone] (Fax)    3. Do they need a 30 day or 90 day supply? [place]

## 2019-01-20 DIAGNOSIS — M1811 Unilateral primary osteoarthritis of first carpometacarpal joint, right hand: Secondary | ICD-10-CM | POA: Diagnosis not present

## 2019-01-20 DIAGNOSIS — M79645 Pain in left finger(s): Secondary | ICD-10-CM | POA: Diagnosis not present

## 2019-01-20 DIAGNOSIS — M65839 Other synovitis and tenosynovitis, unspecified forearm: Secondary | ICD-10-CM | POA: Diagnosis not present

## 2019-01-21 DIAGNOSIS — M1811 Unilateral primary osteoarthritis of first carpometacarpal joint, right hand: Secondary | ICD-10-CM | POA: Diagnosis not present

## 2019-01-21 DIAGNOSIS — M79645 Pain in left finger(s): Secondary | ICD-10-CM | POA: Diagnosis not present

## 2019-01-21 DIAGNOSIS — M65849 Other synovitis and tenosynovitis, unspecified hand: Secondary | ICD-10-CM | POA: Diagnosis not present

## 2019-02-01 ENCOUNTER — Ambulatory Visit (INDEPENDENT_AMBULATORY_CARE_PROVIDER_SITE_OTHER): Payer: Medicare HMO | Admitting: *Deleted

## 2019-02-01 DIAGNOSIS — I495 Sick sinus syndrome: Secondary | ICD-10-CM

## 2019-02-01 DIAGNOSIS — I442 Atrioventricular block, complete: Secondary | ICD-10-CM | POA: Diagnosis not present

## 2019-02-01 LAB — CUP PACEART REMOTE DEVICE CHECK
Battery Remaining Longevity: 124 mo
Battery Remaining Percentage: 95.5 %
Battery Voltage: 2.98 V
Brady Statistic AP VP Percent: 92 %
Brady Statistic AP VS Percent: 1.9 %
Brady Statistic AS VP Percent: 3.9 %
Brady Statistic AS VS Percent: 1 %
Brady Statistic RA Percent Paced: 91 %
Brady Statistic RV Percent Paced: 95 %
Date Time Interrogation Session: 20200608073422
Implantable Lead Implant Date: 20160329
Implantable Lead Implant Date: 20160329
Implantable Lead Location: 753859
Implantable Lead Location: 753860
Implantable Lead Model: 1948
Implantable Pulse Generator Implant Date: 20160329
Lead Channel Impedance Value: 400 Ohm
Lead Channel Impedance Value: 690 Ohm
Lead Channel Pacing Threshold Amplitude: 0.625 V
Lead Channel Pacing Threshold Amplitude: 0.75 V
Lead Channel Pacing Threshold Pulse Width: 0.4 ms
Lead Channel Pacing Threshold Pulse Width: 0.4 ms
Lead Channel Sensing Intrinsic Amplitude: 12 mV
Lead Channel Sensing Intrinsic Amplitude: 3.7 mV
Lead Channel Setting Pacing Amplitude: 1 V
Lead Channel Setting Pacing Amplitude: 1.625
Lead Channel Setting Pacing Pulse Width: 0.4 ms
Lead Channel Setting Sensing Sensitivity: 2.5 mV
Pulse Gen Model: 2240
Pulse Gen Serial Number: 7732903

## 2019-02-08 NOTE — Progress Notes (Signed)
Remote pacemaker transmission.   

## 2019-02-11 DIAGNOSIS — K5900 Constipation, unspecified: Secondary | ICD-10-CM | POA: Diagnosis not present

## 2019-02-11 DIAGNOSIS — I5020 Unspecified systolic (congestive) heart failure: Secondary | ICD-10-CM | POA: Diagnosis not present

## 2019-02-11 DIAGNOSIS — M79646 Pain in unspecified finger(s): Secondary | ICD-10-CM | POA: Diagnosis not present

## 2019-02-12 DIAGNOSIS — I5020 Unspecified systolic (congestive) heart failure: Secondary | ICD-10-CM | POA: Diagnosis not present

## 2019-04-06 ENCOUNTER — Telehealth: Payer: Self-pay | Admitting: Cardiology

## 2019-04-06 DIAGNOSIS — Z79899 Other long term (current) drug therapy: Secondary | ICD-10-CM

## 2019-04-06 DIAGNOSIS — I255 Ischemic cardiomyopathy: Secondary | ICD-10-CM

## 2019-04-06 DIAGNOSIS — R0602 Shortness of breath: Secondary | ICD-10-CM

## 2019-04-06 DIAGNOSIS — I480 Paroxysmal atrial fibrillation: Secondary | ICD-10-CM

## 2019-04-06 DIAGNOSIS — I10 Essential (primary) hypertension: Secondary | ICD-10-CM

## 2019-04-06 DIAGNOSIS — I442 Atrioventricular block, complete: Secondary | ICD-10-CM

## 2019-04-06 DIAGNOSIS — E785 Hyperlipidemia, unspecified: Secondary | ICD-10-CM

## 2019-04-06 DIAGNOSIS — I5022 Chronic systolic (congestive) heart failure: Secondary | ICD-10-CM

## 2019-04-06 NOTE — Telephone Encounter (Signed)
New Message    Patient states he hasn't had labs done in a while and wants to know if Dr. Marlou Porch would order them so he can do them the same day as echo he has scheduled.

## 2019-04-06 NOTE — Telephone Encounter (Signed)
Pt scheduled to see Dr. Marlou Porch in October.  Will route to Dr. Marlou Porch to see if any labs needed prior?

## 2019-04-07 NOTE — Telephone Encounter (Signed)
CBC, CMET, Lipid panel Thanks Candee Furbish, MD

## 2019-04-07 NOTE — Telephone Encounter (Signed)
LM for pt that I will be adding lab orders per Dr. Marlou Porch for when he comes in 05/06/19 for his Echo and to be sure that he is fasting.

## 2019-04-15 ENCOUNTER — Telehealth: Payer: Self-pay | Admitting: Cardiology

## 2019-04-15 NOTE — Telephone Encounter (Signed)
Pt is no longer feeling lightheaded or dizzy. He reports riding his bike further than he usually does when the symptoms started. I asked to pt to send over a manual transmission so that Dr. [name] could advise per device clinic, will route to them for f/u  Advised pt to call us back if any new symptoms present and to take it easy on the exercise until this has been addressed.

## 2019-04-15 NOTE — Telephone Encounter (Signed)
New Message   STAT if patient feels like he/she is going to faint   1) Are you dizzy now? Yes  2) Do you feel faint or have you passed out? Yes, has calmed down now  3) Do you have any other symptoms? Low blood pressure, low heart rate, fatigue  4) Have you checked your HR and BP (record if available)? 99/47 hr 55

## 2019-04-16 NOTE — Telephone Encounter (Addendum)
Reviewed available "PMT" episodes since 02/05/2019 with [employer] rep (Hannah)--likely true PMT, rare functionally-undersensed PVCs with resulting functional non-capture (false "LOC" events) noted. Will turn RV autocapture off and fix RV output at next OV as well as reprogram PVARP to cover V-A conduction if possible.  Patient returned call. Reports he was on a strenuous bike ride from 11:00-12:10 yesterday. Felt like he had less energy than usual. Ate only an egg and a slice of toast for breakfast, so reports he may not have had enough carbs. Thinks he was well hydrated. Noticed that he was "skipping beats" every 3-4 beats instead of his typical 6-7 beats. Back at baseline after getting home and resting.  Explained "skipped beats" likely account for his HR of 55bpm as his PPM base rate is at 60bpm. Advised PPM function and HR histograms are stable, but will discuss with Dr. [name] for any additional recommendations. Pt in agreement with plan and thanked me for my call.

## 2019-04-16 NOTE — Telephone Encounter (Signed)
Transmission reviewed. No recent atrial or ventricular arrhythmia episodes. 3 "PMT" episodes noted on 8/20 at 11:20, 11:46, and 12:06--will review EGMs with industry. Histograms appropriate. Rate response slope was increased from 8 to 11 in 09/2017.  LMOM requesting call back to DC. Direct number given.

## 2019-04-19 DIAGNOSIS — Z8546 Personal history of malignant neoplasm of prostate: Secondary | ICD-10-CM | POA: Diagnosis not present

## 2019-04-26 DIAGNOSIS — R350 Frequency of micturition: Secondary | ICD-10-CM | POA: Diagnosis not present

## 2019-04-26 DIAGNOSIS — Z8546 Personal history of malignant neoplasm of prostate: Secondary | ICD-10-CM | POA: Diagnosis not present

## 2019-04-28 DIAGNOSIS — R69 Illness, unspecified: Secondary | ICD-10-CM | POA: Diagnosis not present

## 2019-05-04 ENCOUNTER — Ambulatory Visit (INDEPENDENT_AMBULATORY_CARE_PROVIDER_SITE_OTHER): Payer: Medicare HMO | Admitting: *Deleted

## 2019-05-04 DIAGNOSIS — I442 Atrioventricular block, complete: Secondary | ICD-10-CM

## 2019-05-04 LAB — CUP PACEART REMOTE DEVICE CHECK
Battery Remaining Longevity: 122 mo
Battery Remaining Percentage: 95.5 %
Battery Voltage: 2.98 V
Brady Statistic AP VP Percent: 92 %
Brady Statistic AP VS Percent: 2 %
Brady Statistic AS VP Percent: 2.5 %
Brady Statistic AS VS Percent: 1 %
Brady Statistic RA Percent Paced: 91 %
Brady Statistic RV Percent Paced: 95 %
Date Time Interrogation Session: 20200907060017
Implantable Lead Implant Date: 20160329
Implantable Lead Implant Date: 20160329
Implantable Lead Location: 753859
Implantable Lead Location: 753860
Implantable Lead Model: 1948
Implantable Pulse Generator Implant Date: 20160329
Lead Channel Impedance Value: 400 Ohm
Lead Channel Impedance Value: 660 Ohm
Lead Channel Pacing Threshold Amplitude: 0.625 V
Lead Channel Pacing Threshold Amplitude: 0.75 V
Lead Channel Pacing Threshold Pulse Width: 0.4 ms
Lead Channel Pacing Threshold Pulse Width: 0.4 ms
Lead Channel Sensing Intrinsic Amplitude: 12 mV
Lead Channel Sensing Intrinsic Amplitude: 3.8 mV
Lead Channel Setting Pacing Amplitude: 1 V
Lead Channel Setting Pacing Amplitude: 1.625
Lead Channel Setting Pacing Pulse Width: 0.4 ms
Lead Channel Setting Sensing Sensitivity: 2.5 mV
Pulse Gen Model: 2240
Pulse Gen Serial Number: 7732903

## 2019-05-06 ENCOUNTER — Other Ambulatory Visit: Payer: Self-pay

## 2019-05-06 ENCOUNTER — Other Ambulatory Visit: Payer: Medicare HMO | Admitting: *Deleted

## 2019-05-06 ENCOUNTER — Ambulatory Visit (HOSPITAL_COMMUNITY): Payer: Medicare HMO | Attending: Internal Medicine

## 2019-05-06 ENCOUNTER — Encounter (INDEPENDENT_AMBULATORY_CARE_PROVIDER_SITE_OTHER): Payer: Self-pay

## 2019-05-06 DIAGNOSIS — I480 Paroxysmal atrial fibrillation: Secondary | ICD-10-CM

## 2019-05-06 DIAGNOSIS — I255 Ischemic cardiomyopathy: Secondary | ICD-10-CM

## 2019-05-06 DIAGNOSIS — E785 Hyperlipidemia, unspecified: Secondary | ICD-10-CM | POA: Diagnosis not present

## 2019-05-06 DIAGNOSIS — I10 Essential (primary) hypertension: Secondary | ICD-10-CM

## 2019-05-06 DIAGNOSIS — Z79899 Other long term (current) drug therapy: Secondary | ICD-10-CM

## 2019-05-06 DIAGNOSIS — R0602 Shortness of breath: Secondary | ICD-10-CM | POA: Diagnosis not present

## 2019-05-06 LAB — LIPID PANEL
Chol/HDL Ratio: 2.1 ratio (ref 0.0–5.0)
Cholesterol, Total: 121 mg/dL (ref 100–199)
HDL: 58 mg/dL (ref >39)
LDL Chol Calc (NIH): 50 mg/dL (ref 0–99)
Triglycerides: 60 mg/dL (ref 0–149)
VLDL Cholesterol Cal: 13 mg/dL (ref 5–40)

## 2019-05-06 LAB — CBC
Hematocrit: 37.3 % — ABNORMAL LOW (ref 37.5–51.0)
Hemoglobin: 12.8 g/dL — ABNORMAL LOW (ref 13.0–17.7)
MCH: 32.6 pg (ref 26.6–33.0)
MCHC: 34.3 g/dL (ref 31.5–35.7)
MCV: 95 fL (ref 79–97)
Platelets: 165 10*3/uL (ref 150–450)
RBC: 3.93 x10E6/uL — ABNORMAL LOW (ref 4.14–5.80)
RDW: 14.7 % (ref 11.6–15.4)
WBC: 8.1 10*3/uL (ref 3.4–10.8)

## 2019-05-06 LAB — COMPREHENSIVE METABOLIC PANEL
ALT: 17 IU/L (ref 0–44)
AST: 23 IU/L (ref 0–40)
Albumin/Globulin Ratio: 2.3 — ABNORMAL HIGH (ref 1.2–2.2)
Albumin: 4.2 g/dL (ref 3.6–4.6)
Alkaline Phosphatase: 53 IU/L (ref 39–117)
BUN/Creatinine Ratio: 20 (ref 10–24)
BUN: 19 mg/dL (ref 8–27)
Bilirubin Total: 1.8 mg/dL — ABNORMAL HIGH (ref 0.0–1.2)
CO2: 22 mmol/L (ref 20–29)
Calcium: 8.8 mg/dL (ref 8.6–10.2)
Chloride: 104 mmol/L (ref 96–106)
Creatinine, Ser: 0.97 mg/dL (ref 0.76–1.27)
GFR calc Af Amer: 82 mL/min/{1.73_m2} (ref >59)
GFR calc non Af Amer: 71 mL/min/{1.73_m2} (ref >59)
Globulin, Total: 1.8 g/dL (ref 1.5–4.5)
Glucose: 98 mg/dL (ref 65–99)
Potassium: 4.6 mmol/L (ref 3.5–5.2)
Sodium: 140 mmol/L (ref 134–144)
Total Protein: 6 g/dL (ref 6.0–8.5)

## 2019-05-12 ENCOUNTER — Telehealth: Payer: Self-pay | Admitting: Cardiology

## 2019-05-12 NOTE — Telephone Encounter (Signed)
Notes recorded by Michae Kava, CMA on 05/12/2019 at 12:36 PM EDT  Pt has been notified of echo results by phone with verbal understanding. Pt confirmed appt with Dr. Marlou Porch 10/12. Pt thanked me for the call. Pt did ask if ok to continue his cycling. He states she rides about 3-4 times a week 10-12 miles and states that he doesn't have as much energy though no other problems. Pt asked if ok to continue to cycling? I assured the pt that I will send message to Dr. Marlou Porch and we will call him back if Dr. Marlou Porch any other recommendations. Pt thanked me for the call. The patient has been notified of the result and verbalized understanding. All questions (if any) were answered.  Julaine Hua, [facility] 05/12/2019 12:35 PM

## 2019-05-12 NOTE — Telephone Encounter (Signed)
New message   Patient is returning call for lab and echo results. Please call.

## 2019-05-19 ENCOUNTER — Encounter: Payer: Self-pay | Admitting: Cardiology

## 2019-05-19 NOTE — Progress Notes (Signed)
Remote pacemaker transmission.   

## 2019-06-01 DIAGNOSIS — R69 Illness, unspecified: Secondary | ICD-10-CM | POA: Diagnosis not present

## 2019-06-07 ENCOUNTER — Ambulatory Visit: Payer: Medicare HMO | Admitting: Cardiology

## 2019-06-07 ENCOUNTER — Encounter (INDEPENDENT_AMBULATORY_CARE_PROVIDER_SITE_OTHER): Payer: Self-pay

## 2019-06-07 ENCOUNTER — Encounter: Payer: Self-pay | Admitting: Cardiology

## 2019-06-07 ENCOUNTER — Other Ambulatory Visit: Payer: Self-pay

## 2019-06-07 VITALS — BP 110/40 | HR 84 | Ht 69.0 in | Wt 166.6 lb

## 2019-06-07 DIAGNOSIS — I10 Essential (primary) hypertension: Secondary | ICD-10-CM | POA: Diagnosis not present

## 2019-06-07 DIAGNOSIS — I5022 Chronic systolic (congestive) heart failure: Secondary | ICD-10-CM | POA: Diagnosis not present

## 2019-06-07 DIAGNOSIS — Z950 Presence of cardiac pacemaker: Secondary | ICD-10-CM | POA: Diagnosis not present

## 2019-06-07 MED ORDER — SACUBITRIL-VALSARTAN 24-26 MG PO TABS: 1.0000 | ORAL_TABLET | Freq: Two times a day (BID) | ORAL | 11 refills | Status: DC

## 2019-06-07 NOTE — Progress Notes (Signed)
Cardiology Office Note:    Date:  06/07/2019   ID:  Andrew Marshall, DOB 07-20-1931, MRN [id number]  PCP:  Seward Carol, MD  Cardiologist:  Candee Furbish, MD  Electrophysiologist:  None   Referring MD: Seward Carol, MD     History of Present Illness:    Andrew Marshall is a 85 y.o. male here for follow-up of pacemaker, dyspnea.  Prior ejection fraction around 40 to 45%.  No anginal symptoms.  Riding bike.  Still having some dyspnea.   Had a lateral wall MI in 2001 with mitral regurgitation and history of SVT arrhythmia.  Had a 3.5 x 12 mm stent to the proximal circumflex in 2001, brachii therapy for in-stent restenosis in 2002, 3.0 x 24 mm Cypher stent to second obtuse marginal covering the prior stents.  Cardiac catheterization 04/2015 with 2.5 x 12 mm Synergy stent placed to proximal circumflex, Dr. Tamala Julian.  [place] pacemaker implant 2016.  In January 2019 had another heart catheterization that showed normal left main, 30% stenosis of proximal LAD, 90% stenosis proximal circumflex, 30% stenosis of proximal RCA and a Synergy stent was delivered to the proximal circumflex artery.  Carotid Dopplers done in April 2017.  In review of Dr. Thurman Coyer prior notes, he had CAD with previous lateral infarct with stable CAD and dyspnea was thought to be likely multi-factorial.  He was trialed on low-dose amlodipine for short while to see if this helped his blood pressure.  He was getting short of breath when cycling up hills which is different for him.  [employer] offered to come to his house and put his bike on exercise or and make pacemaker adjustments. HR was increased to 70.   Still having some dyspnea.  Riding his bicycle frequently.  No chest pain.  No bleeding, no syncope.  LDL 59 triglycerides 66 serum creatinine 0.8, hemoglobin 13.2.  11/03/2018-here for the follow-up of ischemic cardiomyopathy.  New EF 35% on echocardiogram down from 45%.  New start Entresto moderate dose.  Overall still feeling  some shortness of breath with heavy physical exertion but still is very active, biking etc.  No syncope.  06/07/2019-here for cardiomyopathy follow-up.  Was having some more shortness of breath with activity.  Echocardiogram repeated which demonstrates EF in the 25% range.  Continuing with Entresto.  Cycling is fine.  Denies any fevers chills nausea vomiting syncope bleeding. Concerned about heart.  He was worried about prognosis.  We discussed at length.  Blood pressures have been a little bit on the softer side.  Occasional diastolics in the [id number].  He has 1 pressure 95/50.  We are going to pull back on his Entresto.  No syncope.  He did feel some subtle dizziness after a bike ride 1 day.  Denies any fevers chills nausea vomiting syncope bleeding  Past Medical History:  Diagnosis Date  . Allergic rhinitis   . Arthritis    "thumbs primarily" (05/05/2015)  . Basal cell carcinoma, face    "have had several; cut and burned off"  . CAD (coronary artery disease)    a. 2001 s/p post MI- multiple PCI's to LCX/OM;  b. 2003 DES to OM;  c. 05/2012 MV: postlat infarct w/ mild peri-infarct ischemia. nl EF->Med Rx.  . Complete heart block ([place])    St. Jude pacemaker implanted 11/22/14  . ED (erectile dysfunction)   . Esophageal reflux    "slight"  . Essential hypertension, benign   . History of atrial fibrillation   .  History of GI diverticular bleed    a. 05/2013 and 02/2014 - taken off of ASA for this reason.  . Metatarsalgia   . MI (myocardial infarction) ([place]) 2001  . Moderate mitral insufficiency    a. 10/2014 Echo: EF 55-60%, Gr1 DD, mild AS, mod MR, sev dil LA, nl RV, mildly dil RA, mild TR, PASP 55mmHg.  Marland Kitchen Pes planus   . Presence of permanent cardiac pacemaker   . 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 Prostate cancer ([place]) 2008   S/P "radiation and seed implants"  . Pure hypercholesterolemia   . RLS (restless legs syndrome)     Past Surgical History:  Procedure Laterality  Date  . APPENDECTOMY  1956  . BASAL CELL CARCINOMA EXCISION     "face"  . CARDIAC CATHETERIZATION N/A 05/05/2015   Procedure: Left Heart Cath and Coronary Angiography;  Surgeon: Belva Crome, MD;  Location: [facility];  Service: Cardiovascular;  Laterality: N/A;  . CARDIAC CATHETERIZATION N/A 05/05/2015   Procedure: Coronary Stent Intervention;  Surgeon: Belva Crome, MD;  Location: [place] [facility];  Service: Cardiovascular;  Laterality: N/A;  . CATARACT EXTRACTION W/ INTRAOCULAR LENS IMPLANT Left 12/2014  . COLONOSCOPY W/ POLYPECTOMY    . CORONARY ANGIOPLASTY    . CORONARY ANGIOPLASTY WITH STENT PLACEMENT  04/2000; ? ~ 2002; 01/2002  . ESTERNAL BEAM XRT AND SEEDS FOR PROSTATE CANCER    . EYE SURGERY Right 07/2016  . FOOT ARTHRODESIS, TRIPLE Right 2007  . GANGLION CYST EXCISION Right 1970's   wrist  . INSERT / REPLACE / REMOVE PACEMAKER  2017  . LEFT HEART CATH AND CORONARY ANGIOGRAPHY N/A 09/19/2017   Procedure: LEFT HEART CATH AND CORONARY ANGIOGRAPHY;  Surgeon: Burnell Blanks, MD;  Location: [place] [facility];  Service: Cardiovascular;  Laterality: N/A;  . LUMBAR LAMINECTOMY/DECOMPRESSION MICRODISCECTOMY Left 09/18/2016   Procedure: LEFT LUMBAR THREE- LUMBAR FOUR LUMBAR LAMINOTOMY AND MICRODISCECTOMY;  Surgeon: Jovita Gamma, MD;  Location: [facility];  Service: Neurosurgery;  Laterality: Left;  LEFT LUMBAR 3- LUMBAR 4 LUMBAR LAMINOTOMY AND MICRODISCECTOMY  . PERMANENT PACEMAKER INSERTION N/A 11/22/2014   Procedure: PERMANENT PACEMAKER INSERTION;  Surgeon: [name], MD;  Location: [facility];  Service: Cardiovascular;  Laterality: N/A;  . PROSTATE BIOPSY    . TONSILLECTOMY  1942    Current Medications: Current Meds  Medication Sig  . aspirin EC 81 MG tablet Take 1 tablet (81 mg total) by mouth daily.  Marland Kitchen atorvastatin (LIPITOR) 40 MG tablet Take 1 tablet (40 mg total) by mouth daily.  . carvedilol (COREG) 12.5 MG tablet TAKE ONE TABLET BY MOUTH TWICE A DAY WITH MEAL   . cetirizine (ZYRTEC) 10 MG tablet Take 10 mg by mouth daily.   . clopidogrel (PLAVIX) 75 MG tablet TAKE ONE TABLET BY MOUTH DAILY  NEEDS APPY  . Naphazoline HCl (CLEAR EYES OP) Place 1 drop into both eyes daily as needed (for dry eyes).  . nitroGLYCERIN (NITROSTAT) 0.4 MG SL tablet Place 1 tablet (0.4 mg total) under the tongue every 5 (five) minutes as needed for chest pain (MAX 3 TABLETS).  . sildenafil (VIAGRA) 100 MG tablet Take 100 mg by mouth daily as needed for erectile dysfunction.   . [DISCONTINUED] sacubitril-valsartan (ENTRESTO) 49-51 MG Take 1 tablet by mouth 2 (two) times daily.     Allergies:   Ambien [zolpidem] and Erythromycin   Social History   Socioeconomic History  . Marital status: Married  Spouse name: Not on file  . Number of children: Not on file  . Years of education: Not on file  . Highest education level: Not on file  Occupational History  . Not on file  Social Needs  . Financial resource strain: Not on file  . Food insecurity    Worry: Not on file    Inability: Not on file  . Transportation needs    Medical: Not on file    Non-medical: Not on file  Tobacco Use  . Smoking status: Former Smoker    Packs/day: 0.50    Years: 50.00    Pack years: 25.00    Types: Cigarettes    Quit date: 05/22/2000    Years since quitting: 19.0  . Smokeless tobacco: Never Used  Substance and Sexual Activity  . Alcohol use: Yes    Comment: 05/05/2015 "3-6 glasses of wine/day"  . Drug use: No  . Sexual activity: Yes  Lifestyle  . Physical activity    Days per week: Not on file    Minutes per session: Not on file  . Stress: Not on file  Relationships  . Social Herbalist on phone: Not on file    Gets together: Not on file    Attends religious service: Not on file    Active member of club or organization: Not on file    Attends meetings of clubs or organizations: Not on file    Relationship status: Not on file  Other Topics Concern  . Not on file   Social History Narrative  . Not on file     Family History: The patient's family history includes Breast cancer in his mother; Heart disease in his father.  ROS:   Please see the history of present illness.    All other systems reviewed and are negative.  EKGs/Labs/Other Studies Reviewed:    The following studies were reviewed today:  ECHO 05/06/19:   1. The left ventricle has not been assessed 25%%. The cavity size was normal. Left ventricular diastolic Doppler parameters are consistent with impaired relaxation.  2. LVEF is approximately 25% with diffuse hypokinesis, worse in the anterior and lateral walls.  3. The right ventricle has normal systolic function. The cavity was normal. There is mildly increased right ventricular wall thickness.  4. Mitral valve regurgitation is moderate by color flow Doppler.  EKG:   demonstrates AV pacing 70.  Rare intrinsic beats noted  Recent Labs: 05/06/2019: ALT 17; BUN 19; Creatinine, Ser 0.97; Hemoglobin 12.8; Platelets 165; Potassium 4.6; Sodium 140  Recent Lipid Panel    Component Value Date/Time   CHOL 121 05/06/2019 1109   TRIG 60 05/06/2019 1109   HDL 58 05/06/2019 1109   CHOLHDL 2.1 05/06/2019 1109   CHOLHDL 3 04/12/2014 0951   VLDL 21.8 04/12/2014 0951   LDLCALC 50 05/06/2019 1109    Physical Exam:    VS:  BP (!) 110/40   Pulse 84   Ht 5\' 9"  (1.753 m)   Wt 166 lb 9.6 oz (75.6 kg)   SpO2 98%   BMI 24.60 kg/m     Wt Readings from Last 3 Encounters:  06/07/19 166 lb 9.6 oz (75.6 kg)  11/03/18 169 lb 9.6 oz (76.9 kg)  10/14/18 166 lb (75.3 kg)    GEN: Well nourished, well developed, in no acute distress  HEENT: normal  Neck: no JVD, carotid bruits, or masses Cardiac: RRR; no murmurs, rubs, or gallops,no edema  Respiratory:  clear  to auscultation bilaterally, normal work of breathing GI: soft, nontender, nondistended, + BS MS: no deformity or atrophy  Skin: warm and dry, no rash Neuro:  Alert and Oriented x 3,  Strength and sensation are intact Psych: euthymic mood, full affect   ASSESSMENT:    1. Chronic systolic (congestive) heart failure ([place])   2. Essential hypertension   3. Pacemaker    PLAN:    In order of problems listed above: Chronic systolic heart failure/ Dyspnea on exertion -Echocardiogram on 05/06/2019 shows continued EF of 25%.  I am impressed that his cycling.  Explained to him some of the physiologic differences between paced beats and non-paced beats.  Continuing with Entresto however we will decrease the dose back to 24/26 given his relative hypotension at times. medications as above, carvedilol.  He does not have furosemide.  Certainly okay to continue with exercise.  Coronary artery disease - Has residual obtuse marginal ostial stenosis however this is a jailed vessel, would not pursue PCI.  Continue with aggressive medical management.  Certainly could be playing some role in his shortness of breath.  Overall has been quite stable. Continuing with aspirin, statin  Pacemaker - [employer], Dr. [name], stable.  Note reviewed.  No changes made.  Hyperlipidemia -LDL 50.  Excellent.  6 month follow up.   Medication Adjustments/Labs and Tests Ordered: Current medicines are reviewed at length with the patient today.  Concerns regarding medicines are outlined above.  No orders of the defined types were placed in this encounter.  Meds ordered this encounter  Medications  . sacubitril-valsartan (ENTRESTO) 24-26 MG    Sig: Take 1 tablet by mouth 2 (two) times daily.    Dispense:  60 tablet    Refill:  11    Patient Instructions  Medication Instructions:  Please decrease your Entresto to 24-26 mg twice daily.  Continue all other medications as listed.  If you need a refill on your cardiac medications before your next appointment, please call your pharmacy.   Follow-Up: At [facility], you and your health needs are our priority.  As part of our continuing mission to  provide you with exceptional heart care, we have created designated Provider Care Teams.  These Care Teams include your primary Cardiologist (physician) and Advanced Practice Providers (APPs -  Physician Assistants and Nurse Practitioners) who all work together to provide you with the care you need, when you need it. You will need a follow up appointment in 6 months.  Please call our office 2 months in advance to schedule this appointment.  You may see Candee Furbish, MD or one of the following Advanced Practice Providers on your designated Care Team:   Truitt Merle, NP Cecilie Kicks, NP . Kathyrn Drown, NP  Thank you for choosing [facility]!!         Signed, Candee Furbish, MD  06/07/2019 10:26 AM    [place]

## 2019-06-07 NOTE — Patient Instructions (Signed)
Medication Instructions:  Please decrease your Entresto to 24-26 mg twice daily.  Continue all other medications as listed.  If you need a refill on your cardiac medications before your next appointment, please call your pharmacy.   Follow-Up: At [facility], you and your health needs are our priority.  As part of our continuing mission to provide you with exceptional heart care, we have created designated Provider Care Teams.  These Care Teams include your primary Cardiologist (physician) and Advanced Practice Providers (APPs -  Physician Assistants and Nurse Practitioners) who all work together to provide you with the care you need, when you need it. You will need a follow up appointment in 6 months.  Please call our office 2 months in advance to schedule this appointment.  You may see Candee Furbish, MD or one of the following Advanced Practice Providers on your designated Care Team:   Truitt Merle, NP Cecilie Kicks, NP . Kathyrn Drown, NP  Thank you for choosing [facility]!!

## 2019-06-09 IMAGING — CR DG CHEST 2V
2 series · 2 of 2 positions shown · non-contrast
Comparison: 12/02/2014.

CLINICAL DATA: Shortness of breath.

EXAM:
CHEST  2 VIEW

[w chest pa]
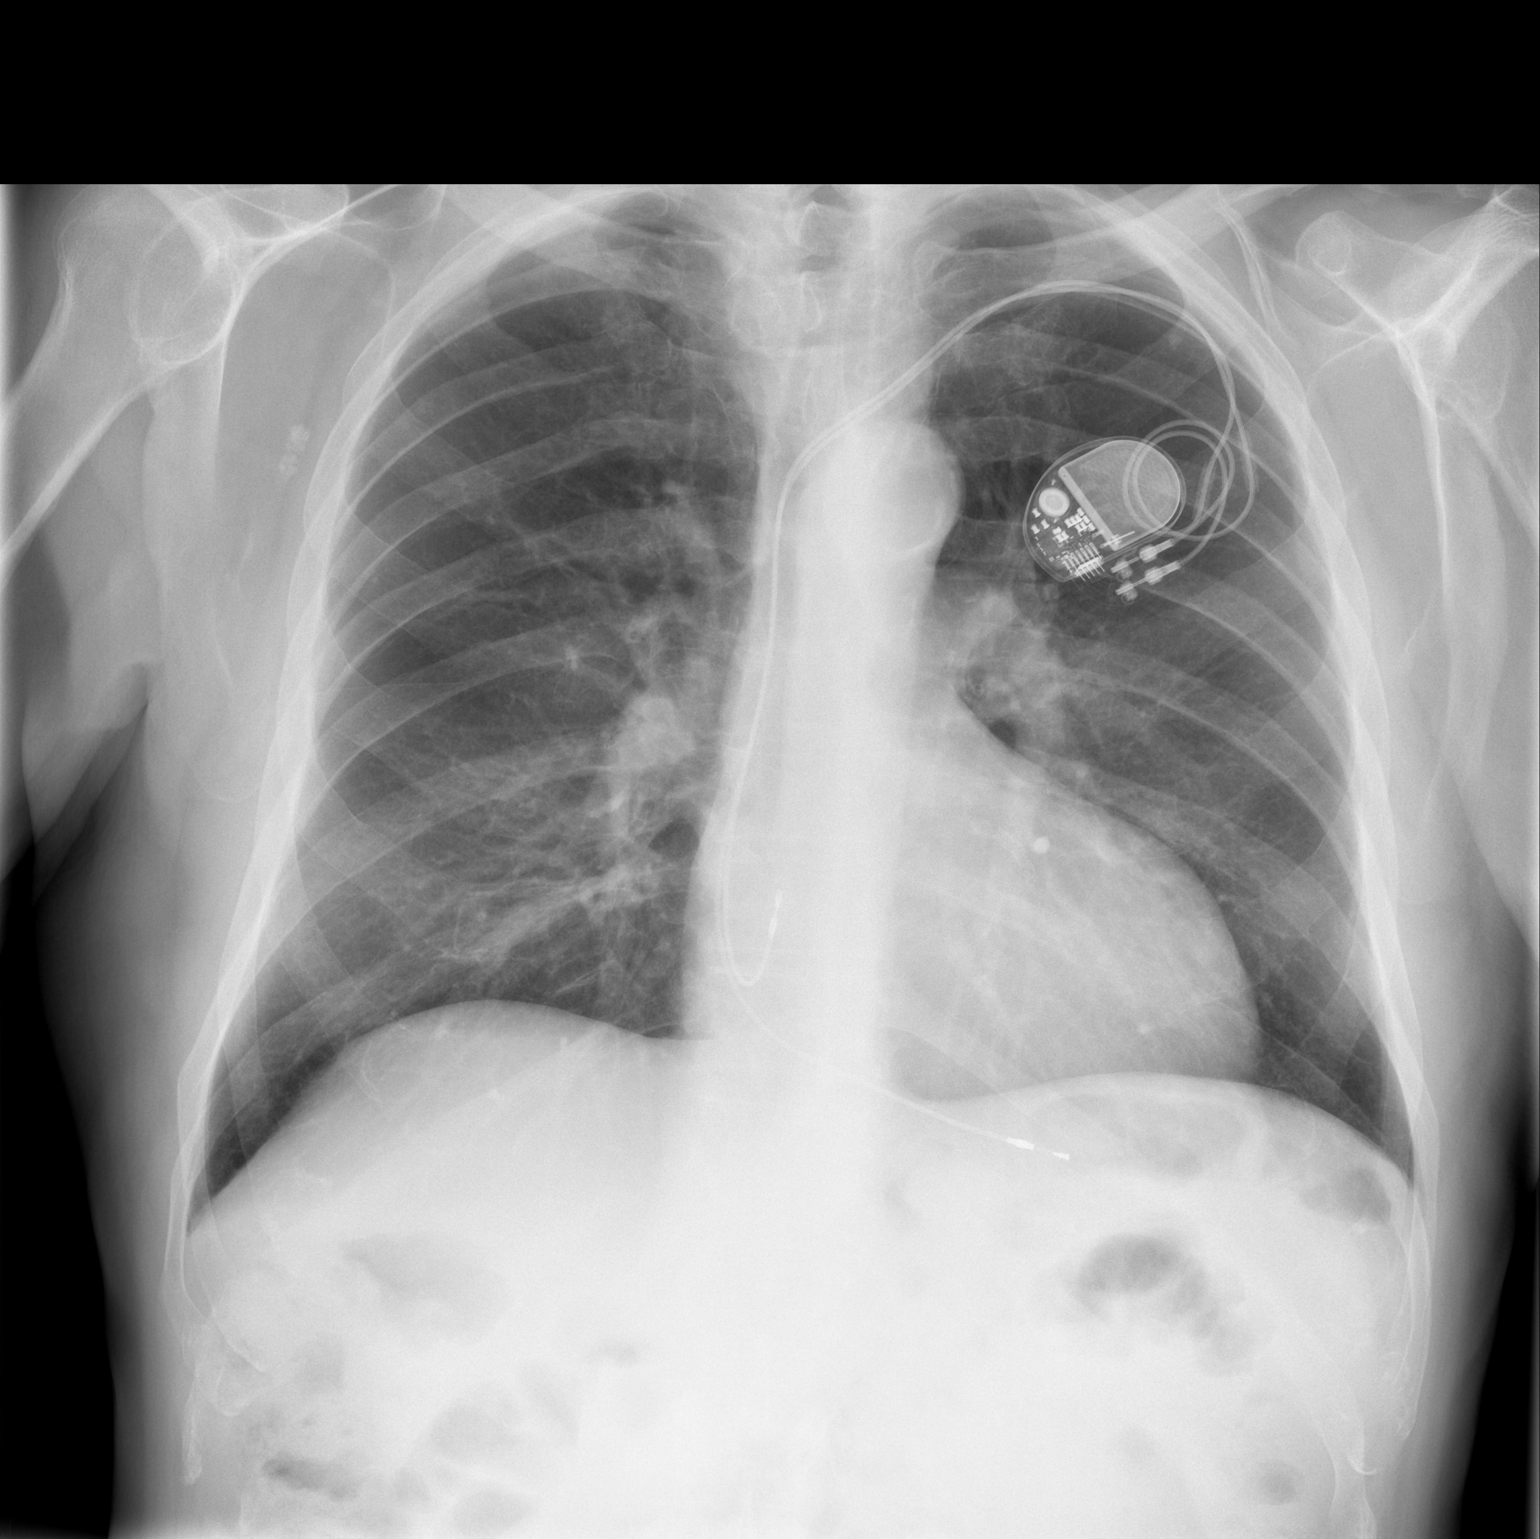

[w chest lat]
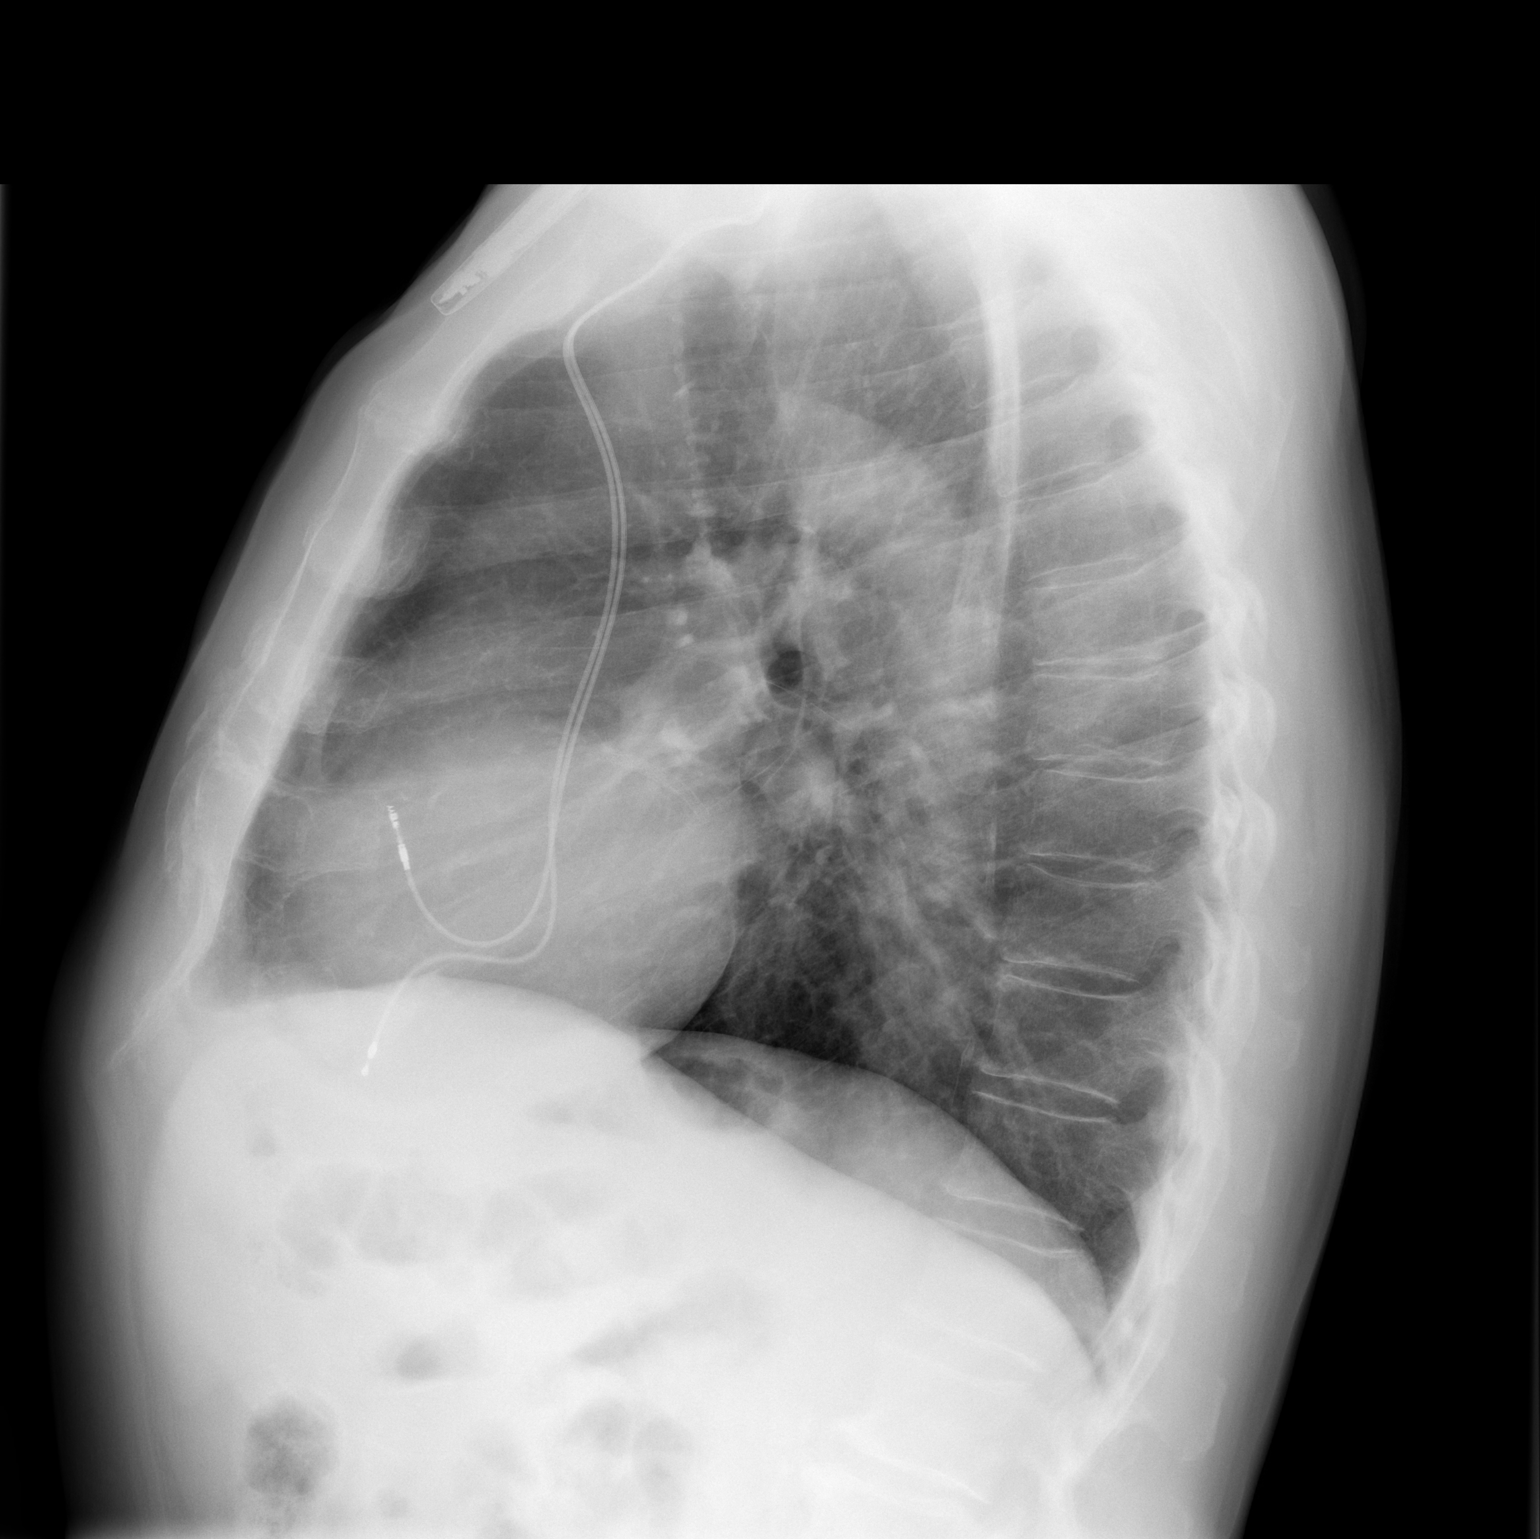

[2 of 2 positions shown; findings below may reference images not displayed]

FINDINGS: Cardiac pacer with lead tips in right atrium right ventricle. Heart
size stable. No pulmonary venous congestion. No focal infiltrate.
Calcified pulmonary nodule left apex. No pleural effusion or
pneumothorax.
IMPRESSION: 1.  Cardiac pacer with lead tips in right atrium right ventricle.

2. Heart size stable. No pulmonary venous congestion. No acute
pulmonary infiltrate.

## 2019-07-13 DIAGNOSIS — E7800 Pure hypercholesterolemia, unspecified: Secondary | ICD-10-CM | POA: Diagnosis not present

## 2019-07-13 DIAGNOSIS — I25119 Atherosclerotic heart disease of native coronary artery with unspecified angina pectoris: Secondary | ICD-10-CM | POA: Diagnosis not present

## 2019-07-13 DIAGNOSIS — I4891 Unspecified atrial fibrillation: Secondary | ICD-10-CM | POA: Diagnosis not present

## 2019-07-13 DIAGNOSIS — I10 Essential (primary) hypertension: Secondary | ICD-10-CM | POA: Diagnosis not present

## 2019-07-13 DIAGNOSIS — I2510 Atherosclerotic heart disease of native coronary artery without angina pectoris: Secondary | ICD-10-CM | POA: Diagnosis not present

## 2019-07-13 DIAGNOSIS — M189 Osteoarthritis of first carpometacarpal joint, unspecified: Secondary | ICD-10-CM | POA: Diagnosis not present

## 2019-07-13 DIAGNOSIS — I209 Angina pectoris, unspecified: Secondary | ICD-10-CM | POA: Diagnosis not present

## 2019-07-13 DIAGNOSIS — I5020 Unspecified systolic (congestive) heart failure: Secondary | ICD-10-CM | POA: Diagnosis not present

## 2019-08-02 LAB — CUP PACEART REMOTE DEVICE CHECK
Battery Remaining Longevity: 123 mo
Battery Remaining Percentage: 95.5 %
Battery Voltage: 2.98 V
Brady Statistic AP VP Percent: 93 %
Brady Statistic AP VS Percent: 2.5 %
Brady Statistic AS VP Percent: 2 %
Brady Statistic AS VS Percent: 1 %
Brady Statistic RA Percent Paced: 93 %
Brady Statistic RV Percent Paced: 95 %
Date Time Interrogation Session: 20201207055929
Implantable Lead Implant Date: 20160329
Implantable Lead Implant Date: 20160329
Implantable Lead Location: 753859
Implantable Lead Location: 753860
Implantable Lead Model: 1948
Implantable Pulse Generator Implant Date: 20160329
Lead Channel Impedance Value: 400 Ohm
Lead Channel Impedance Value: 690 Ohm
Lead Channel Pacing Threshold Amplitude: 0.5 V
Lead Channel Pacing Threshold Amplitude: 0.75 V
Lead Channel Pacing Threshold Pulse Width: 0.4 ms
Lead Channel Pacing Threshold Pulse Width: 0.4 ms
Lead Channel Sensing Intrinsic Amplitude: 10.6 mV
Lead Channel Sensing Intrinsic Amplitude: 3.7 mV
Lead Channel Setting Pacing Amplitude: 1 V
Lead Channel Setting Pacing Amplitude: 1.5 V
Lead Channel Setting Pacing Pulse Width: 0.4 ms
Lead Channel Setting Sensing Sensitivity: 2.5 mV
Pulse Gen Model: 2240
Pulse Gen Serial Number: 7732903

## 2019-08-03 ENCOUNTER — Ambulatory Visit (INDEPENDENT_AMBULATORY_CARE_PROVIDER_SITE_OTHER): Payer: Medicare HMO | Admitting: *Deleted

## 2019-08-03 DIAGNOSIS — Z950 Presence of cardiac pacemaker: Secondary | ICD-10-CM

## 2019-09-03 NOTE — Progress Notes (Signed)
PPM Remote  

## 2019-09-14 ENCOUNTER — Telehealth: Payer: Self-pay | Admitting: Internal Medicine

## 2019-09-14 NOTE — Telephone Encounter (Signed)
Spoke with patient who is concerned because yesterday his BP was between 138/68 - 147/73 which is higher for him. HR was been 64-65 bpm (pacer).  He also c/o being able to hear his heartbeat while sitting and reading.  It is not irregular or rapid.  He denies any recent increase in NA+ or fluid intake.  He reports his BP is usually about 115/60-55.  At last OV Entresto was decreased to 24-26 mg BID (05/2019) d/t soft BPs.  He will continue to monitor vitals.   Advised I will forward this information to Dr Marlou Porch for review and will call back with any needed changes.

## 2019-09-14 NOTE — Telephone Encounter (Signed)
New Message  Pt called and stated that his BP has started going up the last few days. He said that it was very abnormal. He also stated that he started exercising on treadmill and some high intensity exercises. He would like to know some answers about why his blood pressure has gone up.  Please call to discuss

## 2019-09-15 NOTE — Telephone Encounter (Signed)
I spoke to the patient and gave him Dr Marlou Porch' recommendation.  He verbalized understanding.

## 2019-09-15 NOTE — Telephone Encounter (Signed)
Thanks for the update.  Continue to monitor blood pressures.  No changes. Candee Furbish, MD

## 2019-10-03 ENCOUNTER — Other Ambulatory Visit: Payer: Self-pay | Admitting: Cardiology

## 2019-10-03 ENCOUNTER — Other Ambulatory Visit: Payer: Self-pay | Admitting: Internal Medicine

## 2019-10-04 ENCOUNTER — Ambulatory Visit: Payer: Medicare Other | Admitting: Internal Medicine

## 2019-10-04 ENCOUNTER — Encounter: Payer: Self-pay | Admitting: Internal Medicine

## 2019-10-04 ENCOUNTER — Other Ambulatory Visit: Payer: Self-pay

## 2019-10-04 VITALS — BP 152/76 | HR 82 | Ht 69.0 in | Wt 165.0 lb

## 2019-10-04 DIAGNOSIS — I5022 Chronic systolic (congestive) heart failure: Secondary | ICD-10-CM

## 2019-10-04 DIAGNOSIS — Z950 Presence of cardiac pacemaker: Secondary | ICD-10-CM | POA: Diagnosis not present

## 2019-10-04 DIAGNOSIS — I442 Atrioventricular block, complete: Secondary | ICD-10-CM | POA: Diagnosis not present

## 2019-10-04 DIAGNOSIS — I495 Sick sinus syndrome: Secondary | ICD-10-CM

## 2019-10-04 DIAGNOSIS — I428 Other cardiomyopathies: Secondary | ICD-10-CM

## 2019-10-04 DIAGNOSIS — I480 Paroxysmal atrial fibrillation: Secondary | ICD-10-CM | POA: Diagnosis not present

## 2019-10-04 NOTE — Progress Notes (Signed)
PCP: Seward Carol, MD Primary Cardiologist: Dr Marlou Porch Primary EP:  Dr Dema Severin Andrew Marshall is a 86 y.o. male who presents today for routine electrophysiology followup.  Since last being seen in our clinic, the patient reports doing reasonably well.  He has developed CHF symptoms recently.  His EF is depressed.  He remains active and continues to ride his bike, though with overall reduced tolerance.  Today, he denies symptoms of palpitations, chest pain, lower extremity edema, dizziness, presyncope, or syncope.  The patient is otherwise without complaint today.   Past Medical History:  Diagnosis Date  . Allergic rhinitis   . Arthritis    "thumbs primarily" (05/05/2015)  . Basal cell carcinoma, face    "have had several; cut and burned off"  . CAD (coronary artery disease)    a. 2001 s/p post MI- multiple PCI's to LCX/OM;  b. 2003 DES to OM;  c. 05/2012 MV: postlat infarct w/ mild peri-infarct ischemia. nl EF->Med Rx.  . Complete heart block ([place])    [employer] Jude pacemaker implanted 11/22/14  . ED (erectile dysfunction)   . Esophageal reflux    "slight"  . Essential hypertension, benign   . History of atrial fibrillation   . History of GI diverticular bleed    a. 05/2013 and 02/2014 - taken off of ASA for this reason.  . Metatarsalgia   . MI (myocardial infarction) ([place]) 2001  . Moderate mitral insufficiency    a. 10/2014 Echo: EF 55-60%, Gr1 DD, mild AS, mod MR, sev dil LA, nl RV, mildly dil RA, mild TR, PASP 47mmHg.  Marland Kitchen Pes planus   . Presence of permanent cardiac pacemaker   . 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 Prostate cancer ([place]) 2008   S/P "radiation and seed implants"  . Pure hypercholesterolemia   . RLS (restless legs syndrome)    Past Surgical History:  Procedure Laterality Date  . APPENDECTOMY  1956  . BASAL CELL CARCINOMA EXCISION     "face"  . CARDIAC CATHETERIZATION N/A 05/05/2015   Procedure: Left Heart Cath and Coronary Angiography;   Surgeon: Belva Crome, MD;  Location: [place] [facility];  Service: Cardiovascular;  Laterality: N/A;  . CARDIAC CATHETERIZATION N/A 05/05/2015   Procedure: Coronary Stent Intervention;  Surgeon: Belva Crome, MD;  Location: [place] [facility];  Service: Cardiovascular;  Laterality: N/A;  . CATARACT EXTRACTION W/ INTRAOCULAR LENS IMPLANT Left 12/2014  . COLONOSCOPY W/ POLYPECTOMY    . CORONARY ANGIOPLASTY    . CORONARY ANGIOPLASTY WITH STENT PLACEMENT  04/2000; ? ~ 2002; 01/2002  . ESTERNAL BEAM XRT AND SEEDS FOR PROSTATE CANCER    . EYE SURGERY Right 07/2016  . FOOT ARTHRODESIS, TRIPLE Right 2007  . GANGLION CYST EXCISION Right 1970's   wrist  . INSERT / REPLACE / REMOVE PACEMAKER  2017  . LEFT HEART CATH AND CORONARY ANGIOGRAPHY N/A 09/19/2017   Procedure: LEFT HEART CATH AND CORONARY ANGIOGRAPHY;  Surgeon: Burnell Blanks, MD;  Location: [place] [facility];  Service: Cardiovascular;  Laterality: N/A;  . LUMBAR LAMINECTOMY/DECOMPRESSION MICRODISCECTOMY Left 09/18/2016   Procedure: LEFT LUMBAR THREE- LUMBAR FOUR LUMBAR LAMINOTOMY AND MICRODISCECTOMY;  Surgeon: Jovita Gamma, MD;  Location: [place];  Service: Neurosurgery;  Laterality: Left;  LEFT LUMBAR 3- LUMBAR 4 LUMBAR LAMINOTOMY AND MICRODISCECTOMY  . PERMANENT PACEMAKER INSERTION N/A 11/22/2014   Procedure: PERMANENT PACEMAKER INSERTION;  Surgeon: [name], MD;  Location: [facility] CATH LAB;  Service:  Cardiovascular;  Laterality: N/A;  . PROSTATE BIOPSY    . TONSILLECTOMY  1942    ROS- all systems are reviewed and negative except as per HPI above  Current Outpatient Medications  Medication Sig Dispense Refill  . aspirin EC 81 MG tablet Take 1 tablet (81 mg total) by mouth daily.    Marland Kitchen atorvastatin (LIPITOR) 40 MG tablet Take 1 tablet (40 mg total) by mouth daily. 90 tablet 3  . carvedilol (COREG) 12.5 MG tablet TAKE ONE TABLET BY MOUTH TWICE A DAY WITH MEAL 180 tablet 3  . cetirizine (ZYRTEC) 10 MG tablet Take 10 mg by mouth daily.      . clopidogrel (PLAVIX) 75 MG tablet TAKE ONE TABLET BY MOUTH DAILY  NEEDS APPY 90 tablet 3  . Naphazoline HCl (CLEAR EYES OP) Place 1 drop into both eyes daily as needed (for dry eyes).    . nitroGLYCERIN (NITROSTAT) 0.4 MG SL tablet Place 1 tablet (0.4 mg total) under the tongue every 5 (five) minutes as needed for chest pain (MAX 3 TABLETS). 25 tablet 3  . sacubitril-valsartan (ENTRESTO) 24-26 MG Take 1 tablet by mouth 2 (two) times daily. 60 tablet 11  . sildenafil (VIAGRA) 100 MG tablet Take 100 mg by mouth daily as needed for erectile dysfunction.      No current facility-administered medications for this visit.    Physical Exam: Vitals:   10/04/19 1601  BP: (!) 152/76  Pulse: 82  SpO2: 99%  Weight: 165 lb (74.8 kg)  Height: 5\' 9"  (1.753 m)    GEN- The patient is well appearing, alert and oriented x 3 today.   Head- normocephalic, atraumatic Eyes-  Sclera clear, conjunctiva pink Ears- hearing intact Oropharynx- clear Lungs- Clear to ausculation bilaterally, normal work of breathing Chest- pacemaker pocket is well healed Heart- Regular rate and rhythm, no murmurs, rubs or gallops, PMI not laterally displaced GI- soft, NT, ND, + BS Extremities- no clubbing, cyanosis, or edema  Pacemaker interrogation- reviewed in detail today,  See PACEART report  ekg tracing ordered today is personally reviewed and shows sinus with V pacing  Echo 05/06/19- EF 25%, diffuse HK, moderate MR  Assessment and Plan:  1. Symptomatic sinus bradycardia and complete heart block Normal pacemaker function See Pace Art report No changes today he is device dependant today He has developed further decline in his EF. I have strongly advised consideration of upgrade to CRT-P.  Given advanced age, he is not a candidate for CRT-D. Risks, benefits, alternatives to pacemaker upgrade to a biventricular pacemaker were discussed in detail with the patient today. The patient understands that the risks  include but are not limited to bleeding, infection, pneumothorax, perforation, tamponade, vascular damage, renal failure, MI, stroke, death,  and lead dislodgement.  At this time, he is very clear in his decision to avoid the procedure.  He wishes to continue medical therapy.Marland Kitchen  He will contact my office if he changes his mind.  2. afib afib burden has been 0% for several years.  He has declined anticoagulation previously but may reconsider if his AF burden increases  3. HTN Stable No change required today  4. Nonischemic CM/ chronic systolic dysfunction As above  Continue to follow closely with Dr Marlou Porch Return to see me in a year unless he decides to consider CRT-P upgrade in the interim.  [name] MD, [facility] 10/04/2019 4:19 PM

## 2019-10-04 NOTE — Patient Instructions (Signed)
Medication Instructions:  Your physician recommends that you continue on your current medications as directed. Please refer to the Current Medication list given to you today.  Labwork: None ordered.  Testing/Procedures: None ordered.  Follow-Up: Your physician wants you to follow-up in: one year with Dr. [name].   You will receive a reminder letter in the mail two months in advance. If you don't receive a letter, please call our office to schedule the follow-up appointment.  Remote monitoring is used to monitor your Pacemaker from home. This monitoring reduces the number of office visits required to check your device to one time per year. It allows [place] to keep an eye on the functioning of your device to ensure it is working properly. You are scheduled for a device check from home on 11/02/2019. You may send your transmission at any time that day. If you have a wireless device, the transmission will be sent automatically. After your physician reviews your transmission, you will receive a postcard with your next transmission date.  Any Other Special Instructions Will Be Listed Below (If Applicable).  If you need a refill on your cardiac medications before your next appointment, please call your pharmacy.

## 2019-10-05 LAB — CUP PACEART INCLINIC DEVICE CHECK
Battery Remaining Longevity: 108 mo
Battery Voltage: 2.96 V
Brady Statistic RA Percent Paced: 92 %
Brady Statistic RV Percent Paced: 94 %
Date Time Interrogation Session: 20210208172737
Implantable Lead Implant Date: 20160329
Implantable Lead Implant Date: 20160329
Implantable Lead Location: 753859
Implantable Lead Location: 753860
Implantable Lead Model: 1948
Implantable Pulse Generator Implant Date: 20160329
Lead Channel Impedance Value: 412.5 Ohm
Lead Channel Impedance Value: 712.5 Ohm
Lead Channel Pacing Threshold Amplitude: 0.5 V
Lead Channel Pacing Threshold Amplitude: 0.625 V
Lead Channel Pacing Threshold Pulse Width: 0.4 ms
Lead Channel Pacing Threshold Pulse Width: 0.4 ms
Lead Channel Sensing Intrinsic Amplitude: 1.2 mV
Lead Channel Sensing Intrinsic Amplitude: 11.5 mV
Lead Channel Setting Pacing Amplitude: 0.875
Lead Channel Setting Pacing Amplitude: 1.5 V
Lead Channel Setting Pacing Pulse Width: 0.4 ms
Lead Channel Setting Sensing Sensitivity: 2.5 mV
Pulse Gen Model: 2240
Pulse Gen Serial Number: 7732903

## 2019-10-19 ENCOUNTER — Other Ambulatory Visit: Payer: Self-pay | Admitting: Cardiology

## 2019-10-19 DIAGNOSIS — R931 Abnormal findings on diagnostic imaging of heart and coronary circulation: Secondary | ICD-10-CM

## 2019-10-20 ENCOUNTER — Telehealth: Payer: Self-pay | Admitting: Cardiology

## 2019-10-20 NOTE — Telephone Encounter (Signed)
Take 1 tablet by mouth 2 (two) times daily., Starting Mon 06/07/2019, Normal  Dispense: 60 tablet  Refills: 11 ordered  Pharmacy: [facility] [address], [place] (Ph: [phone])  Order Details Ordered on: 06/07/19  Authorizing provider: Jerline Pain, MD   06/07/2019 Andrew Marshall dose was decreased to 24-26 one twice a day.  #60 X 11 was sent into pharmacy as documented.    Pt should not be using 49-51 mg and taking 1/2 tablet twice a day.  Pt is to f/u in 6 month from that appt.  He has an appt scheduled 12/07/2019.

## 2019-10-20 NOTE — Telephone Encounter (Signed)
Pt c/o medication issue:  1. Name of Medication: sacubitril-valsartan (ENTRESTO) 24-26 MG 2. How are you currently taking this medication (dosage and times per day)? As directed  3. Are you having a reaction (difficulty breathing--STAT)? No  4. What is your medication issue? Refill on this medication was denied. Patient is running out, please call wife to discuss the issue.

## 2019-10-20 NOTE — Telephone Encounter (Signed)
I spoke to the patient's wife Caren Griffins, who called because the patient is presently taking Entresto 49-51 1/2 tablet bid.  She said that they called in for refill, but our office denied.    He had visit with Dr [name] a couple days ago and the medication list says Entresto 24-26 bid.

## 2019-10-20 NOTE — Telephone Encounter (Signed)
Called to speak with patient/wife re: RX - left message he needs to call his pharmacy and ask for the RX for 24-26 mg BID to be filled.  Also advised the refill request for 49-51 mg was denied correctly as that is not the dosage he was rxed.

## 2019-11-02 ENCOUNTER — Ambulatory Visit (INDEPENDENT_AMBULATORY_CARE_PROVIDER_SITE_OTHER): Payer: Medicare Other | Admitting: *Deleted

## 2019-11-02 DIAGNOSIS — Z950 Presence of cardiac pacemaker: Secondary | ICD-10-CM

## 2019-11-02 LAB — CUP PACEART REMOTE DEVICE CHECK
Battery Remaining Longevity: 115 mo
Battery Remaining Percentage: 95.5 %
Battery Voltage: 2.96 V
Brady Statistic AP VP Percent: 91 %
Brady Statistic AP VS Percent: 5.8 %
Brady Statistic AS VP Percent: 1 %
Brady Statistic AS VS Percent: 1 %
Brady Statistic RA Percent Paced: 94 %
Brady Statistic RV Percent Paced: 92 %
Date Time Interrogation Session: 20210308020015
Implantable Lead Implant Date: 20160329
Implantable Lead Implant Date: 20160329
Implantable Lead Location: 753859
Implantable Lead Location: 753860
Implantable Lead Model: 1948
Implantable Pulse Generator Implant Date: 20160329
Lead Channel Impedance Value: 400 Ohm
Lead Channel Impedance Value: 640 Ohm
Lead Channel Pacing Threshold Amplitude: 0.5 V
Lead Channel Pacing Threshold Amplitude: 0.75 V
Lead Channel Pacing Threshold Pulse Width: 0.4 ms
Lead Channel Pacing Threshold Pulse Width: 0.4 ms
Lead Channel Sensing Intrinsic Amplitude: 11.2 mV
Lead Channel Sensing Intrinsic Amplitude: 3.6 mV
Lead Channel Setting Pacing Amplitude: 1 V
Lead Channel Setting Pacing Amplitude: 1.5 V
Lead Channel Setting Pacing Pulse Width: 0.4 ms
Lead Channel Setting Sensing Sensitivity: 2.5 mV
Pulse Gen Model: 2240
Pulse Gen Serial Number: 7732903

## 2019-11-02 NOTE — Progress Notes (Signed)
PPM Remote  

## 2019-11-15 ENCOUNTER — Ambulatory Visit: Payer: Medicare Other | Admitting: Cardiology

## 2019-11-15 ENCOUNTER — Other Ambulatory Visit: Payer: Self-pay

## 2019-11-15 ENCOUNTER — Telehealth: Payer: Self-pay | Admitting: Internal Medicine

## 2019-11-15 ENCOUNTER — Encounter: Payer: Self-pay | Admitting: Cardiology

## 2019-11-15 VITALS — BP 100/50 | HR 69 | Ht 69.0 in | Wt 164.8 lb

## 2019-11-15 DIAGNOSIS — Z950 Presence of cardiac pacemaker: Secondary | ICD-10-CM | POA: Diagnosis not present

## 2019-11-15 DIAGNOSIS — I5022 Chronic systolic (congestive) heart failure: Secondary | ICD-10-CM

## 2019-11-15 DIAGNOSIS — I428 Other cardiomyopathies: Secondary | ICD-10-CM

## 2019-11-15 NOTE — Progress Notes (Signed)
Cardiology Office Note:    Date:  11/15/2019   ID:  Andrew Marshall, DOB 01-10-1931, MRN [id number]  PCP:  Seward Carol, MD  Cardiologist:  Candee Furbish, MD  Electrophysiologist:  None   Referring MD: Seward Carol, MD     History of Present Illness:    Andrew Marshall is a 86 y.o. male here for follow-up coronary artery disease, complete heart block.  Decreased EF 25% on echo in 2020 with moderate MR.  Remains active, biking.  Reduced tolerance.  Prior patient of Dr. Thurman Coyer.  Entresto.  Blood pressures have been a little bit on the softer side.  He has had some easy bleeding/bruising.  We are going to discontinue his aspirin and continue with Plavix monotherapy.  He did take a small fall on his bike.  No major injuries.  Past Medical History:  Diagnosis Date  . Allergic rhinitis   . Arthritis    "thumbs primarily" (05/05/2015)  . Basal cell carcinoma, face    "have had several; cut and burned off"  . CAD (coronary artery disease)    a. 2001 s/p post MI- multiple PCI's to LCX/OM;  b. 2003 DES to OM;  c. 05/2012 MV: postlat infarct w/ mild peri-infarct ischemia. nl EF->Med Rx.  . Complete heart block ([place])    St. Jude pacemaker implanted 11/22/14  . ED (erectile dysfunction)   . Esophageal reflux    "slight"  . Essential hypertension, benign   . History of atrial fibrillation   . History of GI diverticular bleed    a. 05/2013 and 02/2014 - taken off of ASA for this reason.  . Metatarsalgia   . MI (myocardial infarction) ([place]) 2001  . Moderate mitral insufficiency    a. 10/2014 Echo: EF 55-60%, Gr1 DD, mild AS, mod MR, sev dil LA, nl RV, mildly dil RA, mild TR, PASP 36mmHg.  Marland Kitchen Pes planus   . Presence of permanent cardiac pacemaker   . 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 Prostate cancer ([place]) 2008   S/P "radiation and seed implants"  . Pure hypercholesterolemia   . RLS (restless legs syndrome)     Past Surgical History:  Procedure Laterality Date   . APPENDECTOMY  1956  . BASAL CELL CARCINOMA EXCISION     "face"  . CARDIAC CATHETERIZATION N/A 05/05/2015   Procedure: Left Heart Cath and Coronary Angiography;  Surgeon: Belva Crome, MD;  Location: [place] [facility];  Service: Cardiovascular;  Laterality: N/A;  . CARDIAC CATHETERIZATION N/A 05/05/2015   Procedure: Coronary Stent Intervention;  Surgeon: Belva Crome, MD;  Location: [place] [facility];  Service: Cardiovascular;  Laterality: N/A;  . CATARACT EXTRACTION W/ INTRAOCULAR LENS IMPLANT Left 12/2014  . COLONOSCOPY W/ POLYPECTOMY    . CORONARY ANGIOPLASTY    . CORONARY ANGIOPLASTY WITH STENT PLACEMENT  04/2000; ? ~ 2002; 01/2002  . ESTERNAL BEAM XRT AND SEEDS FOR PROSTATE CANCER    . EYE SURGERY Right 07/2016  . FOOT ARTHRODESIS, TRIPLE Right 2007  . GANGLION CYST EXCISION Right 1970's   wrist  . INSERT / REPLACE / REMOVE PACEMAKER  2017  . LEFT HEART CATH AND CORONARY ANGIOGRAPHY N/A 09/19/2017   Procedure: LEFT HEART CATH AND CORONARY ANGIOGRAPHY;  Surgeon: Burnell Blanks, MD;  Location: [place] [facility];  Service: Cardiovascular;  Laterality: N/A;  . LUMBAR LAMINECTOMY/DECOMPRESSION MICRODISCECTOMY Left 09/18/2016   Procedure: LEFT LUMBAR THREE- LUMBAR FOUR LUMBAR LAMINOTOMY AND MICRODISCECTOMY;  Surgeon: Herbie Baltimore  Sherwood Gambler, MD;  Location: [facility];  Service: Neurosurgery;  Laterality: Left;  LEFT LUMBAR 3- LUMBAR 4 LUMBAR LAMINOTOMY AND MICRODISCECTOMY  . PERMANENT PACEMAKER INSERTION N/A 11/22/2014   Procedure: PERMANENT PACEMAKER INSERTION;  Surgeon: [name], MD;  Location[facility];  Service: Cardiovascular;  Laterality: N/A;  . PROSTATE BIOPSY    . TONSILLECTOMY  1942    Current Medications: Current Meds  Medication Sig  . atorvastatin (LIPITOR) 40 MG tablet Take 1 tablet (40 mg total) by mouth daily.  . carvedilol (COREG) 12.5 MG tablet TAKE ONE TABLET BY MOUTH TWO TIMES A DAY WITH MEAL  . cetirizine (ZYRTEC) 10 MG tablet Take 10 mg by mouth daily.   .  clopidogrel (PLAVIX) 75 MG tablet TAKE ONE TABLET BY MOUTH DAILY  . Naphazoline HCl (CLEAR EYES OP) Place 1 drop into both eyes daily as needed (for dry eyes).  . nitroGLYCERIN (NITROSTAT) 0.4 MG SL tablet Place 1 tablet (0.4 mg total) under the tongue every 5 (five) minutes as needed for chest pain (MAX 3 TABLETS).  . sacubitril-valsartan (ENTRESTO) 24-26 MG Take 1 tablet by mouth 2 (two) times daily.  . sildenafil (VIAGRA) 100 MG tablet Take 100 mg by mouth daily as needed for erectile dysfunction.   . [DISCONTINUED] aspirin EC 81 MG tablet Take 1 tablet (81 mg total) by mouth daily.     Allergies:   Ambien [zolpidem] and Erythromycin   Social History   Socioeconomic History  . Marital status: Married    Spouse name: Not on file  . Number of children: Not on file  . Years of education: Not on file  . Highest education level: Not on file  Occupational History  . Not on file  Tobacco Use  . Smoking status: Former Smoker    Packs/day: 0.50    Years: 50.00    Pack years: 25.00    Types: Cigarettes    Quit date: 05/22/2000    Years since quitting: 19.4  . Smokeless tobacco: Never Used  Substance and Sexual Activity  . Alcohol use: Yes    Comment: 05/05/2015 "3-6 glasses of wine/day"  . Drug use: No  . Sexual activity: Yes  Other Topics Concern  . Not on file  Social History Narrative  . Not on file   Social Determinants of Health   Financial Resource Strain:   . Difficulty of Paying Living Expenses:   Food Insecurity:   . Worried About Charity fundraiser in the Last Year:   . Arboriculturist in the Last Year:   Transportation Needs:   . Film/video editor (Medical):   Marland Kitchen Lack of Transportation (Non-Medical):   Physical Activity:   . Days of Exercise per Week:   . Minutes of Exercise per Session:   Stress:   . Feeling of Stress :   Social Connections:   . Frequency of Communication with Friends and Family:   . Frequency of Social Gatherings with Friends and  Family:   . Attends Religious Services:   . Active Member of Clubs or Organizations:   . Attends Archivist Meetings:   Marland Kitchen Marital Status:      Family History: The patient's family history includes Breast cancer in his mother; Heart disease in his father.  ROS:   Please see the history of present illness.     All other systems reviewed and are negative.  EKGs/Labs/Other Studies Reviewed:    The following studies were reviewed today:  Cath 2019  Prox RCA to Mid RCA lesion is 30% stenosed.  Prox Cx to Mid Cx lesion is 20% stenosed.  Ost 2nd Mrg lesion is 70% stenosed.  Prox LAD lesion is 20% stenosed.   1. Single vessel CAD 2. Patent stents proximal and mid Circumflex with mild restenosis.  3. The small caliber obtuse marginal branch is jailed by the Circumflex stent. The ostium of the obtuse marginal branch has a 70-80% stenosis which is unchanged from 2016 at the time of deployment of the circumflex stent.  4. Mild non-obstructive disease in the large, dominant RCA and in the LAD which reaches the apex.  5. Mild elevation LV filling pressures  Recommendations: Continue medical management of CAD. Consider low dose lasix given elevated filling pressures.   ECHO 2020   1. The left ventricle has not been assessed 25%. The cavity size was  normal. Left ventricular diastolic Doppler parameters are consistent with  impaired relaxation.  2. LVEF is approximately 25% with diffuse hypokinesis, worse in the  anterior and lateral walls.  3. The right ventricle has normal systolic function. The cavity was  normal. There is mildly increased right ventricular wall thickness.  4. Mitral valve regurgitation is moderate by color flow Doppler.   EKG:  EKG from 10/04/2019 showed ventricular pacing  Recent Labs: 05/06/2019: ALT 17; BUN 19; Creatinine, Ser 0.97; Hemoglobin 12.8; Platelets 165; Potassium 4.6; Sodium 140  Recent Lipid Panel    Component Value Date/Time    CHOL 121 05/06/2019 1109   TRIG 60 05/06/2019 1109   HDL 58 05/06/2019 1109   CHOLHDL 2.1 05/06/2019 1109   CHOLHDL 3 04/12/2014 0951   VLDL 21.8 04/12/2014 0951   LDLCALC 50 05/06/2019 1109    Physical Exam:    VS:  BP (!) 100/50   Pulse 69   Ht 5\' 9"  (1.753 m)   Wt 164 lb 12.8 oz (74.8 kg)   SpO2 98%   BMI 24.34 kg/m     Wt Readings from Last 3 Encounters:  11/15/19 164 lb 12.8 oz (74.8 kg)  10/04/19 165 lb (74.8 kg)  06/07/19 166 lb 9.6 oz (75.6 kg)     GEN:  Well nourished, well developed in no acute distress HEENT: Normal NECK: No JVD; No carotid bruits LYMPHATICS: No lymphadenopathy CARDIAC: RRR, no murmurs, rubs, gallops RESPIRATORY:  Clear to auscultation without rales, wheezing or rhonchi  ABDOMEN: Soft, non-tender, non-distended MUSCULOSKELETAL:  No edema; No deformity  SKIN: Warm and dry NEUROLOGIC:  Alert and oriented x 3 PSYCHIATRIC:  Normal affect   ASSESSMENT:    1. Chronic systolic (congestive) heart failure ([place])   2. Nonischemic cardiomyopathy ([place])   3. Pacemaker    PLAN:    In order of problems listed above:  Chronic systolic heart failure -EF 25% overall well compensated.  Medications reviewed as above.  No changes made.  On goal-directed therapy, carvedilol, Entresto.  Blood pressure 100/50, no dizziness no syncope.  Symptomatic bradycardia with complete heart block -Pacemaker in place.  CRT pacemaker has been advised by Dr. [name].  He discussed this with me today.  I think given his cycling etc. it makes sense for him to pursue CRT pacemaker.  He will contact Dr. Jackalyn Lombard team to set up.  Atrial fibrillation -0% for several years.  Declined anticoagulation but may need to reconsider if his AF burden does increase.  History of lateral wall myocardial infarction/CAD -2001.  3.5 x 12 mm stent to proximal circumflex.  Also had  in-stent restenosis and had a 3.0 x 24 mm Cypher stent to second obtuse marginal covering the prior stent.  In 2016  had another proximal circumflex stent placed Synergy 2.5 x 12 mm. -He does have some easy bleeding/bruising.  I would like for him to discontinue his aspirin and continue with Plavix 75 mg monotherapy.  Hyperlipidemia -On high intensity statin 40 mg of atorvastatin.  Excellent.  Labs of been followed by Dr. Delfina Redwood    Medication Adjustments/Labs and Tests Ordered: Current medicines are reviewed at length with the patient today.  Concerns regarding medicines are outlined above.  No orders of the defined types were placed in this encounter.  No orders of the defined types were placed in this encounter.   Patient Instructions  Medication Instructions:  Please discontinue your Asprin.  Continue all other medications as listed.  *If you need a refill on your cardiac medications before your next appointment, please call your pharmacy*  Follow-Up: At [facility], you and your health needs are our priority.  As part of our continuing mission to provide you with exceptional heart care, we have created designated Provider Care Teams.  These Care Teams include your primary Cardiologist (physician) and Advanced Practice Providers (APPs -  Physician Assistants and Nurse Practitioners) who all work together to provide you with the care you need, when you need it.  We recommend signing up for the patient portal called "MyChart".  Sign up information is provided on this After Visit Summary.  MyChart is used to connect with patients for Virtual Visits (Telemedicine).  Patients are able to view lab/test results, encounter notes, upcoming appointments, etc.  Non-urgent messages can be sent to your provider as well.   To learn more about what you can do with MyChart, go to [email or web address].    Your next appointment:   6 month(s)  The format for your next appointment:   In Person  Provider:   Candee Furbish, MD   Thank you for choosing [facility]!!        Signed, Candee Furbish,  MD  11/15/2019 10:07 AM    [place]

## 2019-11-15 NOTE — Patient Instructions (Signed)
Medication Instructions:  Please discontinue your Asprin.  Continue all other medications as listed.  *If you need a refill on your cardiac medications before your next appointment, please call your pharmacy*  Follow-Up: At [facility], you and your health needs are our priority.  As part of our continuing mission to provide you with exceptional heart care, we have created designated Provider Care Teams.  These Care Teams include your primary Cardiologist (physician) and Advanced Practice Providers (APPs -  Physician Assistants and Nurse Practitioners) who all work together to provide you with the care you need, when you need it.  We recommend signing up for the patient portal called "MyChart".  Sign up information is provided on this After Visit Summary.  MyChart is used to connect with patients for Virtual Visits (Telemedicine).  Patients are able to view lab/test results, encounter notes, upcoming appointments, etc.  Non-urgent messages can be sent to your provider as well.   To learn more about what you can do with MyChart, go to [email or web address].    Your next appointment:   6 month(s)  The format for your next appointment:   In Person  Provider:   Candee Furbish, MD   Thank you for choosing [facility]!!

## 2019-11-15 NOTE — Telephone Encounter (Signed)
Patient calling to schedule his appointment to put another wire in his pacer.

## 2019-11-16 NOTE — Telephone Encounter (Signed)
Pt scheduled for BIV PPM upgrade on November 30, 2019  Labs/covid test scheduled for April 2.  Need to clarify Plavix instructions/complete letter/leave for pick up with soap

## 2019-11-19 NOTE — Telephone Encounter (Signed)
Patient returning call.

## 2019-11-19 NOTE — Telephone Encounter (Signed)
Call placed to Pt.  Left detailed message per DPR.  Advised to start holding plavix after dose on November 23, 2019.  Will see Pt on April 2 to go over rest of instructions.  Work up complete.

## 2019-11-26 ENCOUNTER — Other Ambulatory Visit: Payer: Self-pay

## 2019-11-26 ENCOUNTER — Other Ambulatory Visit: Payer: Medicare Other | Admitting: *Deleted

## 2019-11-26 ENCOUNTER — Other Ambulatory Visit (HOSPITAL_COMMUNITY)
Admission: RE | Admit: 2019-11-26 | Discharge: 2019-11-26 | Disposition: A | Discharge status: Home / Self Care | Payer: Medicare Other | Source: Ambulatory Visit | Attending: Internal Medicine | Admitting: Internal Medicine

## 2019-11-26 DIAGNOSIS — Z01812 Encounter for preprocedural laboratory examination: Secondary | ICD-10-CM | POA: Insufficient documentation

## 2019-11-26 DIAGNOSIS — Z20822 Contact with and (suspected) exposure to covid-19: Secondary | ICD-10-CM | POA: Insufficient documentation

## 2019-11-26 DIAGNOSIS — I428 Other cardiomyopathies: Secondary | ICD-10-CM

## 2019-11-26 LAB — SARS CORONAVIRUS 2 (TAT 6-24 HRS): SARS Coronavirus 2: NEGATIVE

## 2019-11-27 LAB — CBC WITH DIFFERENTIAL/PLATELET
Basophils Absolute: 0.1 10*3/uL (ref 0.0–0.2)
Basos: 1 %
EOS (ABSOLUTE): 0.2 10*3/uL (ref 0.0–0.4)
Eos: 3 %
Hematocrit: 37.5 % (ref 37.5–51.0)
Hemoglobin: 13.1 g/dL (ref 13.0–17.7)
Immature Grans (Abs): 0 10*3/uL (ref 0.0–0.1)
Immature Granulocytes: 0 %
Lymphocytes Absolute: 2 10*3/uL (ref 0.7–3.1)
Lymphs: 24 %
MCH: 32.3 pg (ref 26.6–33.0)
MCHC: 34.9 g/dL (ref 31.5–35.7)
MCV: 93 fL (ref 79–97)
Monocytes Absolute: 0.7 10*3/uL (ref 0.1–0.9)
Monocytes: 8 %
Neutrophils Absolute: 5.4 10*3/uL (ref 1.4–7.0)
Neutrophils: 64 %
Platelets: 172 10*3/uL (ref 150–450)
RBC: 4.05 x10E6/uL — ABNORMAL LOW (ref 4.14–5.80)
RDW: 14.6 % (ref 11.6–15.4)
WBC: 8.4 10*3/uL (ref 3.4–10.8)

## 2019-11-27 LAB — BASIC METABOLIC PANEL
BUN/Creatinine Ratio: 23 (ref 10–24)
BUN: 22 mg/dL (ref 8–27)
CO2: 23 mmol/L (ref 20–29)
Calcium: 8.8 mg/dL (ref 8.6–10.2)
Chloride: 105 mmol/L (ref 96–106)
Creatinine, Ser: 0.97 mg/dL (ref 0.76–1.27)
GFR calc Af Amer: 81 mL/min/{1.73_m2} (ref >59)
GFR calc non Af Amer: 70 mL/min/{1.73_m2} (ref >59)
Glucose: 134 mg/dL — ABNORMAL HIGH (ref 65–99)
Potassium: 4.8 mmol/L (ref 3.5–5.2)
Sodium: 142 mmol/L (ref 134–144)

## 2019-11-29 NOTE — Progress Notes (Signed)
Instructed patient on the following items: Arrival time 0830 Nothing to eat or drink after midnight No meds AM of procedure Responsible person to drive you home and stay with you for 24 hrs Wash with special soap night before and morning of procedure  

## 2019-11-30 ENCOUNTER — Ambulatory Visit (HOSPITAL_COMMUNITY)
Admission: RE | Disposition: A | Discharge status: Home / Self Care | Payer: Self-pay | Source: Home / Self Care | Attending: Internal Medicine

## 2019-11-30 ENCOUNTER — Ambulatory Visit (HOSPITAL_COMMUNITY)
Admission: RE | Admit: 2019-11-30 | Discharge: 2019-11-30 | Disposition: A | Discharge status: Home / Self Care | Payer: Medicare Other | Attending: Internal Medicine | Admitting: Internal Medicine

## 2019-11-30 ENCOUNTER — Other Ambulatory Visit: Payer: Self-pay

## 2019-11-30 ENCOUNTER — Ambulatory Visit (HOSPITAL_COMMUNITY): Payer: Medicare Other

## 2019-11-30 DIAGNOSIS — Z955 Presence of coronary angioplasty implant and graft: Secondary | ICD-10-CM | POA: Diagnosis not present

## 2019-11-30 DIAGNOSIS — M19042 Primary osteoarthritis, left hand: Secondary | ICD-10-CM | POA: Insufficient documentation

## 2019-11-30 DIAGNOSIS — I428 Other cardiomyopathies: Secondary | ICD-10-CM | POA: Diagnosis not present

## 2019-11-30 DIAGNOSIS — I252 Old myocardial infarction: Secondary | ICD-10-CM | POA: Diagnosis not present

## 2019-11-30 DIAGNOSIS — G2581 Restless legs syndrome: Secondary | ICD-10-CM | POA: Diagnosis not present

## 2019-11-30 DIAGNOSIS — I442 Atrioventricular block, complete: Secondary | ICD-10-CM | POA: Insufficient documentation

## 2019-11-30 DIAGNOSIS — Z7982 Long term (current) use of aspirin: Secondary | ICD-10-CM | POA: Diagnosis not present

## 2019-11-30 DIAGNOSIS — Z7902 Long term (current) use of antithrombotics/antiplatelets: Secondary | ICD-10-CM | POA: Insufficient documentation

## 2019-11-30 DIAGNOSIS — I495 Sick sinus syndrome: Secondary | ICD-10-CM | POA: Insufficient documentation

## 2019-11-30 DIAGNOSIS — M19041 Primary osteoarthritis, right hand: Secondary | ICD-10-CM | POA: Insufficient documentation

## 2019-11-30 DIAGNOSIS — I110 Hypertensive heart disease with heart failure: Secondary | ICD-10-CM | POA: Insufficient documentation

## 2019-11-30 DIAGNOSIS — I4891 Unspecified atrial fibrillation: Secondary | ICD-10-CM | POA: Diagnosis not present

## 2019-11-30 DIAGNOSIS — E7800 Pure hypercholesterolemia, unspecified: Secondary | ICD-10-CM | POA: Diagnosis not present

## 2019-11-30 DIAGNOSIS — I5022 Chronic systolic (congestive) heart failure: Secondary | ICD-10-CM | POA: Diagnosis not present

## 2019-11-30 DIAGNOSIS — K219 Gastro-esophageal reflux disease without esophagitis: Secondary | ICD-10-CM | POA: Insufficient documentation

## 2019-11-30 DIAGNOSIS — Z79899 Other long term (current) drug therapy: Secondary | ICD-10-CM | POA: Diagnosis not present

## 2019-11-30 DIAGNOSIS — I2510 Atherosclerotic heart disease of native coronary artery without angina pectoris: Secondary | ICD-10-CM | POA: Diagnosis not present

## 2019-11-30 DIAGNOSIS — Z950 Presence of cardiac pacemaker: Secondary | ICD-10-CM

## 2019-11-30 HISTORY — PX: BIV UPGRADE: EP1202

## 2019-11-30 SURGERY — BIV UPGRADE
Anesthesia: LOCAL

## 2019-11-30 MED ORDER — HEPARIN (PORCINE) IN NACL 2-0.9 UNITS/ML
INTRAMUSCULAR | Status: AC | PRN
  Administered 2019-11-30: 500 mL

## 2019-11-30 MED ORDER — LIDOCAINE HCL 1 % IJ SOLN
INTRAMUSCULAR | Status: AC
  Filled 2019-11-30: qty 60

## 2019-11-30 MED ORDER — CEFAZOLIN SODIUM-DEXTROSE 2-4 GM/100ML-% IV SOLN
2.0000 g | INTRAVENOUS | Status: AC
  Administered 2019-11-30: 2 g via INTRAVENOUS

## 2019-11-30 MED ORDER — MIDAZOLAM HCL 5 MG/5ML IJ SOLN
INTRAMUSCULAR | Status: DC | PRN
  Administered 2019-11-30 (×2): 1 mg via INTRAVENOUS

## 2019-11-30 MED ORDER — SODIUM CHLORIDE 0.9 % IV SOLN: 250.0000 mL | INTRAVENOUS | Status: DC | PRN

## 2019-11-30 MED ORDER — FENTANYL CITRATE (PF) 100 MCG/2ML IJ SOLN
INTRAMUSCULAR | Status: DC | PRN
  Administered 2019-11-30: 12.5 ug via INTRAVENOUS

## 2019-11-30 MED ORDER — FENTANYL CITRATE (PF) 100 MCG/2ML IJ SOLN
INTRAMUSCULAR | Status: AC
  Filled 2019-11-30: qty 2

## 2019-11-30 MED ORDER — SODIUM CHLORIDE 0.9 % IV SOLN: INTRAVENOUS | Status: DC

## 2019-11-30 MED ORDER — CHLORHEXIDINE GLUCONATE 4 % EX LIQD: 4.0000 "application " | Freq: Once | CUTANEOUS | Status: DC

## 2019-11-30 MED ORDER — IOHEXOL 350 MG/ML SOLN
INTRAVENOUS | Status: DC | PRN
  Administered 2019-11-30: 15 mL

## 2019-11-30 MED ORDER — SODIUM CHLORIDE 0.9% FLUSH: 3.0000 mL | INTRAVENOUS | Status: DC | PRN

## 2019-11-30 MED ORDER — HEPARIN (PORCINE) IN NACL 1000-0.9 UT/500ML-% IV SOLN
INTRAVENOUS | Status: AC
  Filled 2019-11-30: qty 500

## 2019-11-30 MED ORDER — MIDAZOLAM HCL 5 MG/5ML IJ SOLN
INTRAMUSCULAR | Status: AC
  Filled 2019-11-30: qty 5

## 2019-11-30 MED ORDER — SODIUM CHLORIDE 0.9 % IV SOLN
INTRAVENOUS | Status: AC
  Filled 2019-11-30: qty 2

## 2019-11-30 MED ORDER — LIDOCAINE HCL 1 % IJ SOLN
INTRAMUSCULAR | Status: AC
  Filled 2019-11-30: qty 20

## 2019-11-30 MED ORDER — LIDOCAINE HCL (PF) 1 % IJ SOLN
INTRAMUSCULAR | Status: DC | PRN
  Administered 2019-11-30: 80 mL

## 2019-11-30 MED ORDER — ONDANSETRON HCL 4 MG/2ML IJ SOLN: 4.0000 mg | Freq: Four times a day (QID) | INTRAMUSCULAR | Status: DC | PRN

## 2019-11-30 MED ORDER — SODIUM CHLORIDE 0.9 % IV SOLN
80.0000 mg | INTRAVENOUS | Status: AC
  Administered 2019-11-30: 80 mg

## 2019-11-30 MED ORDER — ACETAMINOPHEN 325 MG PO TABS: 325.0000 mg | ORAL_TABLET | ORAL | Status: DC | PRN

## 2019-11-30 MED ORDER — SODIUM CHLORIDE 0.9% FLUSH: 3.0000 mL | Freq: Two times a day (BID) | INTRAVENOUS | Status: DC

## 2019-11-30 MED ORDER — CEFAZOLIN SODIUM-DEXTROSE 2-4 GM/100ML-% IV SOLN
INTRAVENOUS | Status: AC
  Filled 2019-11-30: qty 100

## 2019-11-30 SURGICAL SUPPLY — 15 items
ADAPTER SEALING SSA-EW-09 (MISCELLANEOUS) ×1 IMPLANT
ADPR INTRO LNG 9FR SL XTD WNG (MISCELLANEOUS) ×1
CABLE SURGICAL S-101-97-12 (CABLE) ×2 IMPLANT
CATH ATTAIN COM SURV 6250V-MB2 (CATHETERS) ×1 IMPLANT
CATH HEX JOS 2-5-2 65CM 6F REP (CATHETERS) ×1 IMPLANT
KIT MICROPUNCTURE NIT STIFF (SHEATH) ×1 IMPLANT
LEAD QUARTET 1458Q-86CM (Lead) ×1 IMPLANT
PACEMAKER QUDR ALLR CRT PM3562 (Pacemaker) IMPLANT
PAD PRO RADIOLUCENT 2001M-C (PAD) ×2 IMPLANT
PMKR QUADRA ALLURE CRT PM3562 (Pacemaker) ×2 IMPLANT
SHEATH 9.5FR PRELUDE SNAP 13 (SHEATH) ×1 IMPLANT
SHEATH PINNACLE 6F 10CM (SHEATH) ×1 IMPLANT
SLITTER 6232ADJ (MISCELLANEOUS) ×1 IMPLANT
TRAY PACEMAKER INSERTION (PACKS) ×2 IMPLANT
WIRE ACUITY WHISPER EDS 4648 (WIRE) ×1 IMPLANT

## 2019-11-30 NOTE — Progress Notes (Signed)
Up and walked to bathroom and tolerated well;left chest dressing dry and intact; Dr [name] in and ok to d/c home

## 2019-11-30 NOTE — H&P (Signed)
PCP: Seward Carol, MD Primary Cardiologist: Dr Marlou Porch Primary EP:  Dr Dema Severin Andrew Marshall is a 86 y.o. male who presents today for pacemaker upgrade to CRT.  Since last being seen in our clinic, the patient reports doing reasonably well.  He has developed CHF symptoms recently.  His EF is depressed.  He remains active and continues to ride his bike, though with overall reduced tolerance.  Today, he denies symptoms of palpitations, chest pain, lower extremity edema, dizziness, presyncope, or syncope.  The patient is otherwise without complaint today.       Past Medical History:  Diagnosis Date  . Allergic rhinitis   . Arthritis    "thumbs primarily" (05/05/2015)  . Basal cell carcinoma, face    "have had several; cut and burned off"  . CAD (coronary artery disease)    a. 2001 s/p post MI- multiple PCI's to LCX/OM;  b. 2003 DES to OM;  c. 05/2012 MV: postlat infarct w/ mild peri-infarct ischemia. nl EF->Med Rx.  . Complete heart block ([place])    St. Jude pacemaker implanted 11/22/14  . ED (erectile dysfunction)   . Esophageal reflux    "slight"  . Essential hypertension, benign   . History of atrial fibrillation   . History of GI diverticular bleed    a. 05/2013 and 02/2014 - taken off of ASA for this reason.  . Metatarsalgia   . MI (myocardial infarction) ([place]) 2001  . Moderate mitral insufficiency    a. 10/2014 Echo: EF 55-60%, Gr1 DD, mild AS, mod MR, sev dil LA, nl RV, mildly dil RA, mild TR, PASP 12mmHg.  Marland Kitchen Pes planus   . Presence of permanent cardiac pacemaker   . 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 Prostate cancer ([place]) 2008   S/P "radiation and seed implants"  . Pure hypercholesterolemia   . RLS (restless legs syndrome)         Past Surgical History:  Procedure Laterality Date  . APPENDECTOMY  1956  . BASAL CELL CARCINOMA EXCISION     "face"  . CARDIAC CATHETERIZATION N/A 05/05/2015   Procedure: Left Heart Cath and  Coronary Angiography;  Surgeon: Belva Crome, MD;  Location: [place] [facility];  Service: Cardiovascular;  Laterality: N/A;  . CARDIAC CATHETERIZATION N/A 05/05/2015   Procedure: Coronary Stent Intervention;  Surgeon: Belva Crome, MD;  Location: [place] [facility];  Service: Cardiovascular;  Laterality: N/A;  . CATARACT EXTRACTION W/ INTRAOCULAR LENS IMPLANT Left 12/2014  . COLONOSCOPY W/ POLYPECTOMY    . CORONARY ANGIOPLASTY    . CORONARY ANGIOPLASTY WITH STENT PLACEMENT  04/2000; ? ~ 2002; 01/2002  . ESTERNAL BEAM XRT AND SEEDS FOR PROSTATE CANCER    . EYE SURGERY Right 07/2016  . FOOT ARTHRODESIS, TRIPLE Right 2007  . GANGLION CYST EXCISION Right 1970's   wrist  . INSERT / REPLACE / REMOVE PACEMAKER  2017  . LEFT HEART CATH AND CORONARY ANGIOGRAPHY N/A 09/19/2017   Procedure: LEFT HEART CATH AND CORONARY ANGIOGRAPHY;  Surgeon: Burnell Blanks, MD;  Location: [place] [facility];  Service: Cardiovascular;  Laterality: N/A;  . LUMBAR LAMINECTOMY/DECOMPRESSION MICRODISCECTOMY Left 09/18/2016   Procedure: LEFT LUMBAR THREE- LUMBAR FOUR LUMBAR LAMINOTOMY AND MICRODISCECTOMY;  Surgeon: Jovita Gamma, MD;  Location: [place];  Service: Neurosurgery;  Laterality: Left;  LEFT LUMBAR 3- LUMBAR 4 LUMBAR LAMINOTOMY AND MICRODISCECTOMY  . PERMANENT PACEMAKER INSERTION N/A 11/22/2014   Procedure: PERMANENT PACEMAKER INSERTION;  Surgeon: [name],  MD;  Location: [place] CATH LAB;  Service: Cardiovascular;  Laterality: N/A;  . PROSTATE BIOPSY    . TONSILLECTOMY  1942    ROS- all systems are reviewed and negative except as per HPI above        Current Outpatient Medications  Medication Sig Dispense Refill  . aspirin EC 81 MG tablet Take 1 tablet (81 mg total) by mouth daily.    Marland Kitchen atorvastatin (LIPITOR) 40 MG tablet Take 1 tablet (40 mg total) by mouth daily. 90 tablet 3  . carvedilol (COREG) 12.5 MG tablet TAKE ONE TABLET BY MOUTH TWICE A DAY WITH MEAL 180 tablet 3  .  cetirizine (ZYRTEC) 10 MG tablet Take 10 mg by mouth daily.     . clopidogrel (PLAVIX) 75 MG tablet TAKE ONE TABLET BY MOUTH DAILY  NEEDS APPY 90 tablet 3  . Naphazoline HCl (CLEAR EYES OP) Place 1 drop into both eyes daily as needed (for dry eyes).    . nitroGLYCERIN (NITROSTAT) 0.4 MG SL tablet Place 1 tablet (0.4 mg total) under the tongue every 5 (five) minutes as needed for chest pain (MAX 3 TABLETS). 25 tablet 3  . sacubitril-valsartan (ENTRESTO) 24-26 MG Take 1 tablet by mouth 2 (two) times daily. 60 tablet 11  . sildenafil (VIAGRA) 100 MG tablet Take 100 mg by mouth daily as needed for erectile dysfunction.      No current facility-administered medications for this visit.   Physical Exam: Vitals:   11/30/19 0830  BP: (!) 146/61  Pulse: 79  Resp: 17  Temp: 97.7 F (36.5 C)  TempSrc: Skin  SpO2: 100%  Weight: 70.3 kg  Height: 5\' 8"  (1.727 m)    GEN- The patient is well appearing, alert and oriented x 3 today.   Head- normocephalic, atraumatic Eyes-  Sclera clear, conjunctiva pink Ears- hearing intact Oropharynx- clear Neck- supple, Lungs-  normal work of breathing Heart- Regular rate and rhythm (paced) GI- soft, NT, ND, + BS Extremities- no clubbing, cyanosis, or edema  MS- no significant deformity or atrophy Skin- no rash or lesion Psych- euthymic mood, full affect Neuro- strength and sensation are intact  Echo 05/06/19- EF 25%, diffuse HK, moderate MR  Assessment and Plan:  1. Symptomatic sinus bradycardia and complete heart block Normal pacemaker function See Pace Art report No changes today he is device dependant today He has developed further decline in his EF. I have strongly advised consideration of upgrade to CRT-P.  Given advanced age, he is not a candidate for CRT-D.  Risks, benefits, and alternatives to upgrade to CRT-P were discussed in detail today.  The patient understands that risks include but are not limited to bleeding, infection,  pneumothorax, perforation, tamponade, vascular damage, renal failure, MI, stroke, death,  damage to his existing leads, and lead dislodgement and wishes to proceed.   [name] MD, [facility] 11/30/2019 10:31 AM

## 2019-11-30 NOTE — Discharge Instructions (Signed)
Outer dressing off in 24 hours Restart plavix Friday 4/9  After Your Lead Revision  . Do not lift your arm above shoulder height for 1 week after your procedure. After 7 days, you may progress as below.  Marland Kitchen REMOVE ARM [facility] TOMORROW     Tuesday December 07, 2019  Wednesday December 08, 2019 Thursday December 09, 2019 Friday December 10, 2019   . Do not lift, push, pull, or carry anything over 10 pounds with the affected arm until 6 weeks (Tuesday Jan 11, 2020) after your procedure.   . Do not drive until your wound check or until instructed by your healthcare provider that you are safe to do so.   . Monitor your surgical site for redness, swelling, and drainage. Call the device clinic at [phone] if you experience these symptoms or fever/chills.  . If your incision is sealed with Steri-strips or staples. You may shower 7 days after your procedure and wash your incision with soap and water as long as it is healed. If your incision is closed with Dermabond/Surgical glue. You may shower 1 day after your pacemaker implant and wash your incision with soap and water. Avoid lotions, ointments, or perfumes over your incision until it is well-healed.  . You may use a hot tub or a pool AFTER your wound check appointment if the incision is completely closed.   . Your cardiac device may be MRI compatible. We will discuss this at your office visit/Wound check  . Remote monitoring is used to monitor your cardiac device from home. This monitoring is scheduled every 91 days by our office. It allows [place] to keep an eye on the functioning of your device to ensure it is working properly. You will routinely see your Electrophysiologist annually (more often if necessary).   Implantable Cardiac Device Lead Replacement, Care After This sheet gives you information about how to care for yourself after your procedure. Your health care provider may also give you more specific instructions. If you have problems or questions,  contact your health care provider. What can I expect after the procedure? After your procedure, it is common to have:  Mild discomfort at the incision site.  A small amount of drainage or bleeding at the incision site. This is usually no more than a spot. Follow these instructions at home: Incision care         Follow instructions from your health care provider about how to take care of your incision. Make sure you: ? Leave stitches (sutures), skin glue, or adhesive strips in place. These skin closures may need to stay in place for 2 weeks or longer. If adhesive strip edges start to loosen and curl up, you may trim the loose edges. Do not remove adhesive strips completely unless your health care provider tells you to do that.  Check your incision area every day for signs of infection. Check for: ? More redness, swelling, or pain. ? More fluid or blood. ? Warmth. ? Pus or a bad smell. [employer] cell phones should be kept 12 inches (30 cm) away from the cardiac device when they are on. When talking on a cell phone, use the ear on the opposite side of your cardiac device.  Do not place a cell phone in a pocket next to the cardiac device.  Household appliances do not interfere with modern-day cardiac device. Medicines  Take over-the-counter and prescription medicines only as told by your health care provider. General instructions  Do not raise the arm on the side of your procedure higher than your shoulder for at least 7 days. Except for this restriction, continue to use your arm as normal to prevent problems.  Do not take baths, swim, or use a hot tub until your health care provider says it is okay to do so. You may shower as directed by your health care provider.  Do not lift anything that is heavier than 10 lb (4.5 kg) for 6 weeks or the limit that your health care provider tells you, until he or she says that it is safe.  Return to your normal  activities after 2 weeks, or as told by your health care provider. Ask your health care provider what activities are safe for you.  Keep all follow-up visits as told by your health care provider. This is important. Contact a health care provider if:  You have more redness, swelling, or pain around your incision.  You have more fluid or blood coming from your incision.  Your incision feels warm to the touch.  You have pus or a bad smell coming from your incision.  You have a fever.  The arm or hand on the side of the cardiac device becomes swollen.  The symptoms you had before your procedure are not getting better. Get help right away if:  You develop chest pain.  You feel like you will faint.  You feel light-headed.  You faint. Summary  Check your incision area every day for signs of infection, such as more fluid or blood. A small amount of drainage or bleeding at the incision site is normal.  Do not raise the arm on the side of your procedure higher than your shoulder for at least 5 days, or as long as directed by your health care provider.  Digital cell phones should be kept 12 inches (30 cm) away from the cardiac device when they are on. When talking on a cell phone, use the ear on the opposite side of your cardiac device.  If the symptoms that led to having your lead replaced are not getting better, contact your health care provider. This information is not intended to replace advice given to you by your health care provider. Make sure you discuss any questions you have with your health care provider.

## 2019-12-01 MED FILL — Lidocaine HCl Local Inj 1%: INTRAMUSCULAR | Qty: 80 | Status: AC

## 2019-12-07 ENCOUNTER — Ambulatory Visit: Payer: Medicare Other | Admitting: Cardiology

## 2019-12-09 ENCOUNTER — Telehealth: Payer: Self-pay | Admitting: Internal Medicine

## 2019-12-09 NOTE — Telephone Encounter (Signed)
Pt wanted to know if his wife would be able to come back with him to his upcoming appt on 04-20. He would like an extra set of ears. His wife also tends to ask all the questions he forgets to ask. Please let the patient know what the office decides

## 2019-12-09 NOTE — Telephone Encounter (Signed)
If the patient has memory issues or needs physical assistance she may come to the apt with him.

## 2019-12-10 NOTE — Telephone Encounter (Signed)
I let the pt know per Leigh phone note that if he is having memory issues or need physical help then his wife can come. The pt states that would be a lie and his wife can just sit in the car. He thanked me for my help and verbalized understanding.

## 2019-12-14 ENCOUNTER — Ambulatory Visit (INDEPENDENT_AMBULATORY_CARE_PROVIDER_SITE_OTHER): Payer: Medicare Other | Admitting: *Deleted

## 2019-12-14 ENCOUNTER — Other Ambulatory Visit: Payer: Self-pay

## 2019-12-14 DIAGNOSIS — Z950 Presence of cardiac pacemaker: Secondary | ICD-10-CM

## 2019-12-14 LAB — CUP PACEART INCLINIC DEVICE CHECK
Battery Remaining Longevity: 54 mo
Battery Voltage: 3.01 V
Brady Statistic RA Percent Paced: 96 %
Brady Statistic RV Percent Paced: 97 %
Date Time Interrogation Session: 20210420170528
Implantable Lead Implant Date: 20160329
Implantable Lead Implant Date: 20160329
Implantable Lead Implant Date: 20210406
Implantable Lead Location: 753858
Implantable Lead Location: 753859
Implantable Lead Location: 753860
Implantable Lead Model: 1948
Implantable Pulse Generator Implant Date: 20210406
Lead Channel Impedance Value: 425 Ohm
Lead Channel Impedance Value: 725 Ohm
Lead Channel Impedance Value: 762.5 Ohm
Lead Channel Pacing Threshold Amplitude: 0.5 V
Lead Channel Pacing Threshold Amplitude: 0.5 V
Lead Channel Pacing Threshold Amplitude: 0.625 V
Lead Channel Pacing Threshold Amplitude: 1.5 V
Lead Channel Pacing Threshold Amplitude: 1.5 V
Lead Channel Pacing Threshold Pulse Width: 0.5 ms
Lead Channel Pacing Threshold Pulse Width: 0.5 ms
Lead Channel Pacing Threshold Pulse Width: 0.5 ms
Lead Channel Pacing Threshold Pulse Width: 1 ms
Lead Channel Pacing Threshold Pulse Width: 1 ms
Lead Channel Sensing Intrinsic Amplitude: 1 mV
Lead Channel Sensing Intrinsic Amplitude: 12 mV
Lead Channel Setting Pacing Amplitude: 2 V
Lead Channel Setting Pacing Amplitude: 2 V
Lead Channel Setting Pacing Amplitude: 3.5 V
Lead Channel Setting Pacing Pulse Width: 0.5 ms
Lead Channel Setting Pacing Pulse Width: 1 ms
Lead Channel Setting Sensing Sensitivity: 4 mV
Pulse Gen Model: 3562
Pulse Gen Serial Number: 6058048

## 2019-12-14 NOTE — Progress Notes (Signed)
Wound check appointment. Steri-strips removed. Wound without redness or edema. Incision edges approximated, wound well healed. Normal device function. RV thresholds and impedences stable. P wave measured at 1.45mv today, consistent with last check on 10/04/19. LV threshold now 1.5v @ 1.8ms and 2.25 v @ 0.5 ms implant threshhold was 1v at 0.57ms.  LV output programmed at 3.5V @1 .27ms for extra safety margin until 3 month visit. Will review with Dr. [name] to confirm no additional recommendations.  Pt is BiV pacing 97%.  Histogram distribution appropriate for patient and level of activity.  1 AMS episode lasting 4 seconds with peak rate of 173.  No ventricular arrhythmias noted. Patient educated about wound care, arm mobility, lifting restrictions, shock plan. ROV  03/06/20 with Dr. [name].  Next scheduled remote transmission 03/01/20.

## 2020-01-01 ENCOUNTER — Other Ambulatory Visit: Payer: Self-pay | Admitting: Cardiology

## 2020-01-03 ENCOUNTER — Telehealth: Payer: Self-pay | Admitting: Internal Medicine

## 2020-01-03 NOTE — Telephone Encounter (Signed)
Patient has some questions on the recovery process for his pacemaker implant.

## 2020-01-03 NOTE — Telephone Encounter (Signed)
Question concerning when he can start lifting weights and cycle after generator change with new lead placement on 11/30/19. Education done that riding his bike was fine but he should not lift , push , or pull more than 10 lbs with his left arm until 01/11/20.No further questions.

## 2020-02-01 ENCOUNTER — Other Ambulatory Visit: Payer: Self-pay | Admitting: Internal Medicine

## 2020-02-01 DIAGNOSIS — I739 Peripheral vascular disease, unspecified: Secondary | ICD-10-CM

## 2020-02-07 ENCOUNTER — Other Ambulatory Visit: Payer: Medicare Other

## 2020-02-09 ENCOUNTER — Ambulatory Visit
Admission: RE | Admit: 2020-02-09 | Discharge: 2020-02-09 | Disposition: A | Discharge status: Home / Self Care | Payer: Medicare Other | Source: Ambulatory Visit | Attending: Internal Medicine | Admitting: Internal Medicine

## 2020-02-09 ENCOUNTER — Other Ambulatory Visit: Payer: Self-pay | Admitting: Internal Medicine

## 2020-02-09 DIAGNOSIS — I739 Peripheral vascular disease, unspecified: Secondary | ICD-10-CM

## 2020-03-01 ENCOUNTER — Ambulatory Visit (INDEPENDENT_AMBULATORY_CARE_PROVIDER_SITE_OTHER): Payer: Medicare Other | Admitting: *Deleted

## 2020-03-01 DIAGNOSIS — Z950 Presence of cardiac pacemaker: Secondary | ICD-10-CM

## 2020-03-01 LAB — CUP PACEART REMOTE DEVICE CHECK
Battery Remaining Longevity: 56 mo
Battery Remaining Percentage: 95.5 %
Battery Voltage: 2.99 V
Brady Statistic AP VP Percent: 93 %
Brady Statistic AP VS Percent: 1.3 %
Brady Statistic AS VP Percent: 1.3 %
Brady Statistic AS VS Percent: 1 %
Brady Statistic RA Percent Paced: 89 %
Date Time Interrogation Session: 20210707020009
Implantable Lead Implant Date: 20160329
Implantable Lead Implant Date: 20160329
Implantable Lead Implant Date: 20210406
Implantable Lead Location: 753858
Implantable Lead Location: 753859
Implantable Lead Location: 753860
Implantable Lead Model: 1948
Implantable Pulse Generator Implant Date: 20210406
Lead Channel Impedance Value: 400 Ohm
Lead Channel Impedance Value: 660 Ohm
Lead Channel Impedance Value: 690 Ohm
Lead Channel Pacing Threshold Amplitude: 0.5 V
Lead Channel Pacing Threshold Amplitude: 0.75 V
Lead Channel Pacing Threshold Amplitude: 1.5 V
Lead Channel Pacing Threshold Pulse Width: 0.5 ms
Lead Channel Pacing Threshold Pulse Width: 0.5 ms
Lead Channel Pacing Threshold Pulse Width: 1 ms
Lead Channel Sensing Intrinsic Amplitude: 12 mV
Lead Channel Sensing Intrinsic Amplitude: 3.5 mV
Lead Channel Setting Pacing Amplitude: 2 V
Lead Channel Setting Pacing Amplitude: 2 V
Lead Channel Setting Pacing Amplitude: 3.5 V
Lead Channel Setting Pacing Pulse Width: 0.5 ms
Lead Channel Setting Pacing Pulse Width: 1 ms
Lead Channel Setting Sensing Sensitivity: 4 mV
Pulse Gen Model: 3562
Pulse Gen Serial Number: 6058048

## 2020-03-02 NOTE — Progress Notes (Signed)
Remote pacemaker transmission.   

## 2020-03-06 ENCOUNTER — Encounter: Payer: Medicare Other | Admitting: Internal Medicine

## 2020-03-13 ENCOUNTER — Other Ambulatory Visit: Payer: Self-pay

## 2020-03-13 ENCOUNTER — Encounter: Payer: Self-pay | Admitting: Internal Medicine

## 2020-03-13 ENCOUNTER — Ambulatory Visit: Payer: Medicare Other | Admitting: Internal Medicine

## 2020-03-13 VITALS — BP 124/72 | HR 68 | Ht 68.0 in | Wt 162.8 lb

## 2020-03-13 DIAGNOSIS — I428 Other cardiomyopathies: Secondary | ICD-10-CM

## 2020-03-13 DIAGNOSIS — I340 Nonrheumatic mitral (valve) insufficiency: Secondary | ICD-10-CM | POA: Diagnosis not present

## 2020-03-13 DIAGNOSIS — I10 Essential (primary) hypertension: Secondary | ICD-10-CM

## 2020-03-13 DIAGNOSIS — I442 Atrioventricular block, complete: Secondary | ICD-10-CM

## 2020-03-13 NOTE — Progress Notes (Signed)
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 male who presents today for routine electrophysiology followup.  Since his recent biv pacemaker upgrade, the patient reports doing very well. he continues to ride his bike, with minimal limitations.  Today, he denies symptoms of palpitations, chest pain, shortness of breath,  lower extremity edema, dizziness, presyncope, or syncope.  The patient is otherwise without complaint today.   Past Medical History:  Diagnosis Date  . Allergic rhinitis   . Arthritis    "thumbs primarily" (05/05/2015)  . Basal cell carcinoma, face    "have had several; cut and burned off"  . CAD (coronary artery disease)    a. 2001 s/p post MI- multiple PCI's to LCX/OM;  b. 2003 DES to OM;  c. 05/2012 MV: postlat infarct w/ mild peri-infarct ischemia. nl EF->Med Rx.  . Complete heart block ([place])    St. Jude pacemaker implanted 11/22/14  . ED (erectile dysfunction)   . Esophageal reflux    "slight"  . Essential hypertension, benign   . History of atrial fibrillation   . History of GI diverticular bleed    a. 05/2013 and 02/2014 - taken off of ASA for this reason.  . Metatarsalgia   . MI (myocardial infarction) ([place]) 2001  . Moderate mitral insufficiency    a. 10/2014 Echo: EF 55-60%, Gr1 DD, mild AS, mod MR, sev dil LA, nl RV, mildly dil RA, mild TR, PASP 55mmHg.  Marland Kitchen Pes planus   . Presence of permanent cardiac pacemaker   . 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 Prostate cancer ([place]) 2008   S/P "radiation and seed implants"  . Pure hypercholesterolemia   . RLS (restless legs syndrome)    Past Surgical History:  Procedure Laterality Date  . APPENDECTOMY  1956  . BASAL CELL CARCINOMA EXCISION     "face"  . BIV UPGRADE N/A 11/30/2019   Procedure: BIV PPM UPGRADE;  Surgeon: [name], MD;  Location: [place] [facility];  Service: Cardiovascular;  Laterality: N/A;  . CARDIAC CATHETERIZATION N/A 05/05/2015    Procedure: Left Heart Cath and Coronary Angiography;  Surgeon: Belva Crome, MD;  Location: [place] [facility];  Service: Cardiovascular;  Laterality: N/A;  . CARDIAC CATHETERIZATION N/A 05/05/2015   Procedure: Coronary Stent Intervention;  Surgeon: Belva Crome, MD;  Location: [place] [facility];  Service: Cardiovascular;  Laterality: N/A;  . CATARACT EXTRACTION W/ INTRAOCULAR LENS IMPLANT Left 12/2014  . COLONOSCOPY W/ POLYPECTOMY    . CORONARY ANGIOPLASTY    . CORONARY ANGIOPLASTY WITH STENT PLACEMENT  04/2000; ? ~ 2002; 01/2002  . ESTERNAL BEAM XRT AND SEEDS FOR PROSTATE CANCER    . EYE SURGERY Right 07/2016  . FOOT ARTHRODESIS, TRIPLE Right 2007  . GANGLION CYST EXCISION Right 1970's   wrist  . INSERT / REPLACE / REMOVE PACEMAKER  2017  . LEFT HEART CATH AND CORONARY ANGIOGRAPHY N/A 09/19/2017   Procedure: LEFT HEART CATH AND CORONARY ANGIOGRAPHY;  Surgeon: Burnell Blanks, MD;  Location: [place] [facility];  Service: Cardiovascular;  Laterality: N/A;  . LUMBAR LAMINECTOMY/DECOMPRESSION MICRODISCECTOMY Left 09/18/2016   Procedure: LEFT LUMBAR THREE- LUMBAR FOUR LUMBAR LAMINOTOMY AND MICRODISCECTOMY;  Surgeon: Jovita Gamma, MD;  Location: [place];  Service: Neurosurgery;  Laterality: Left;  LEFT LUMBAR 3- LUMBAR 4 LUMBAR LAMINOTOMY AND MICRODISCECTOMY  . PERMANENT PACEMAKER INSERTION N/A 11/22/2014   Procedure: PERMANENT PACEMAKER INSERTION;  Surgeon: [name], MD;  Location: [facility];  Service: Cardiovascular;  Laterality: N/A;  . PROSTATE BIOPSY    . TONSILLECTOMY  1942    ROS- all systems are reviewed and negative except as per HPI above  Current Outpatient Medications  Medication Sig Dispense Refill  . atorvastatin (LIPITOR) 40 MG tablet TAKE ONE TABLET BY MOUTH DAILY 90 tablet 1  . Carboxymethylcellul-Glycerin (LUBRICATING EYE DROPS OP) Place 1 drop into both eyes daily as needed (dry eyes).    . carvedilol (COREG) 12.5 MG tablet TAKE ONE TABLET BY MOUTH TWO TIMES A  DAY WITH MEAL 180 tablet 3  . cetirizine (ZYRTEC) 10 MG tablet Take 10 mg by mouth daily.     . clopidogrel (PLAVIX) 75 MG tablet TAKE ONE TABLET BY MOUTH DAILY 90 tablet 3  . nitroGLYCERIN (NITROSTAT) 0.4 MG SL tablet Place 1 tablet (0.4 mg total) under the tongue every 5 (five) minutes as needed for chest pain (MAX 3 TABLETS). 25 tablet 3  . sacubitril-valsartan (ENTRESTO) 24-26 MG Take 1 tablet by mouth 2 (two) times daily. 60 tablet 11  . sildenafil (VIAGRA) 100 MG tablet Take 100 mg by mouth daily as needed for erectile dysfunction.      No current facility-administered medications for this visit.    Physical Exam: Vitals:   03/13/20 1249  BP: 124/72  Pulse: 68  SpO2: 97%  Weight: 162 lb 12.8 oz (73.8 kg)  Height: 5\' 8"  (1.727 m)    GEN- The patient is well appearing, alert and oriented x 3 today.   Head- normocephalic, atraumatic Eyes-  Sclera clear, conjunctiva pink Ears- hearing intact Oropharynx- clear Lungs-  , normal work of breathing Chest- pacemaker pocket is well healed Heart- Regular rate and rhythm  GI- soft, NT, ND, + BS Extremities- no clubbing, cyanosis, or edema  Pacemaker interrogation- reviewed in detail today,  See PACEART report  ekg tracing ordered today is personally reviewed and shows sinus with BiV pacing  Assessment and Plan:  1. Symptomatic complete heart block Normal pacemaker function See Pace Art report No changes today he is device dependant today  2. Nonischemic CM Doing well s/p CRT Enroll in ICM device clinic with Sharman Cheek Return in 3 months to see EP PA with an echo prior to the visit  3. afib Well controlled (no afib in several years) Consider [place] if afib returns  4. HTN Stable No change required today  Risks, benefits and potential toxicities for medications prescribed and/or refilled reviewed with patient today.   [name] MD, [facility] 03/13/2020 12:49 PM

## 2020-03-13 NOTE — Patient Instructions (Signed)
Your physician recommends that you continue on your current medications as directed. Please refer to the Current Medication list given to you today.  Your physician has requested that you have an echocardiogram. Echocardiography is a painless test that uses sound waves to create images of your heart. It provides your doctor with information about the size and shape of your heart and how well your heart's chambers and valves are working. This procedure takes approximately one hour. There are no restrictions for this procedure.  Your physician recommends that you schedule a follow-up appointment in: 3 months 3 months with Jonni Sanger echo prior to visit

## 2020-03-20 ENCOUNTER — Encounter: Payer: Medicare Other | Admitting: Internal Medicine

## 2020-03-22 ENCOUNTER — Telehealth: Payer: Self-pay

## 2020-03-22 NOTE — Telephone Encounter (Signed)
-----   Message from [name], MD sent at 03/13/2020  2:36 PM EDT ----- Please call and offer enrollment in ICM clinic I did not mention today but he would be a good candidate

## 2020-03-22 NOTE — Telephone Encounter (Signed)
ICM intro call to patient.  Provided ICM intro and pt agreeable to monthly ICM follow up.   Advised monitor should be by bedside in order for it to automatically transmit a report during sleep hours of 12 midnight and 6 AM.  Patient confirmed monitor is at bedside. Advised will receive a call after the transmission is reviewed to provide results.  Explained a Remote Home Transmission will be seen as daytime appointment on office summary visit sheet but the time is not relevant since all transmissions are sent at night time so there is no obligation to stay by the monitor at that time appointed time during the day. Provided ICM number and explained should call if experiencing any fluid symptoms such as weight gain, shortness of breath or extremity/abdominal swelling.  Patient provided positive feedback on the care that he has received by Dr Marlou Porch, Dr [name] and all the [facility] staff about the excellent care he has received.   Ist ICM remote transmission scheduled for 04/03/2020.

## 2020-03-30 LAB — CUP PACEART INCLINIC DEVICE CHECK
Date Time Interrogation Session: 20210819163913
Implantable Lead Implant Date: 20160329
Implantable Lead Implant Date: 20160329
Implantable Lead Implant Date: 20210406
Implantable Lead Location: 753858
Implantable Lead Location: 753859
Implantable Lead Location: 753860
Implantable Lead Model: 1948
Implantable Pulse Generator Implant Date: 20210406
Lead Channel Pacing Threshold Amplitude: 0.5 V
Lead Channel Pacing Threshold Amplitude: 0.625 V
Lead Channel Pacing Threshold Amplitude: 1 V
Lead Channel Pacing Threshold Pulse Width: 0.5 ms
Lead Channel Pacing Threshold Pulse Width: 0.5 ms
Lead Channel Pacing Threshold Pulse Width: 0.5 ms
Lead Channel Sensing Intrinsic Amplitude: 1.3 mV
Lead Channel Sensing Intrinsic Amplitude: 9.7 mV
Pulse Gen Model: 3562
Pulse Gen Serial Number: 6058048

## 2020-04-03 ENCOUNTER — Ambulatory Visit (INDEPENDENT_AMBULATORY_CARE_PROVIDER_SITE_OTHER): Payer: Medicare Other

## 2020-04-03 DIAGNOSIS — Z950 Presence of cardiac pacemaker: Secondary | ICD-10-CM | POA: Diagnosis not present

## 2020-04-03 DIAGNOSIS — I5022 Chronic systolic (congestive) heart failure: Secondary | ICD-10-CM | POA: Diagnosis not present

## 2020-04-04 NOTE — Progress Notes (Signed)
EPIC Encounter for ICM Monitoring  Patient Name: Andrew Marshall is a 86 y.o. male Date: 04/04/2020 Primary Care Physican: Seward Carol, MD Primary Cardiologist: Marlou Porch Electrophysiologist: Allred Bi-V Pacing: 95%  04/04/2020 Weight: 148 lbs        1st ICM remote transmission.  Heart Failure questions reviewed.  Pt asymptomatic for fluid accumulation.  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   CorVue thoracic impedance normal.   Prescribed: No diuretic  Recommendations: Encouraged to call if experiencing fluid symptoms.  Follow-up plan: ICM clinic phone appointment on 05/08/2020.  91 day device clinic remote transmission 05/31/2020.    EP/Cardiology Office Visits: 05/15/2020 with Dr. Marlou Porch and 06/19/2020 with Oda Kilts, PA.    Copy of ICM check sent to Dr. [name].   3 month ICM trend: 04/03/2020    1 Year ICM trend:       Rosalene Billings, RN 04/04/2020 4:55 PM

## 2020-05-08 ENCOUNTER — Ambulatory Visit (INDEPENDENT_AMBULATORY_CARE_PROVIDER_SITE_OTHER): Payer: Medicare Other

## 2020-05-08 DIAGNOSIS — Z950 Presence of cardiac pacemaker: Secondary | ICD-10-CM

## 2020-05-08 DIAGNOSIS — I5022 Chronic systolic (congestive) heart failure: Secondary | ICD-10-CM | POA: Diagnosis not present

## 2020-05-09 NOTE — Progress Notes (Signed)
EPIC Encounter for ICM Monitoring  Patient Name: Andrew Marshall is a 85 y.o. male Date: 05/09/2020 Primary Care Physican: Seward Carol, MD Primary Cardiologist: Marlou Porch Electrophysiologist: Allred Bi-V Pacing: 94%            04/04/2020 Weight: 148 lbs                                                            Heart Failure questions reviewed.  Pt asymptomatic for fluid accumulation   CorVue thoracic impedance normal but was suggesting possible fluid accumulation from 8/21-8/30.  Prescribed: No diuretic  Recommendations: Encouraged to call if experiencing fluid symptoms.  Follow-up plan: ICM clinic phone appointment on 06/12/2020.  91 day device clinic remote transmission 05/31/2020.    EP/Cardiology Office Visits: 05/15/2020 with Dr. Marlou Porch and 06/19/2020 with Oda Kilts, PA.    Copy of ICM check sent to Dr. [name].    3 month ICM trend: 05/08/2020    1 Year ICM trend:       Rosalene Billings, RN 05/09/2020 3:48 PM

## 2020-05-15 ENCOUNTER — Ambulatory Visit: Payer: Medicare Other | Admitting: Cardiology

## 2020-05-31 ENCOUNTER — Ambulatory Visit (INDEPENDENT_AMBULATORY_CARE_PROVIDER_SITE_OTHER): Payer: Medicare Other

## 2020-05-31 DIAGNOSIS — I442 Atrioventricular block, complete: Secondary | ICD-10-CM

## 2020-06-01 LAB — CUP PACEART REMOTE DEVICE CHECK
Battery Remaining Longevity: 56 mo
Battery Remaining Percentage: 95.5 %
Battery Voltage: 2.98 V
Brady Statistic AP VP Percent: 93 %
Brady Statistic AP VS Percent: 1 %
Brady Statistic AS VP Percent: 1.1 %
Brady Statistic AS VS Percent: 1 %
Brady Statistic RA Percent Paced: 88 %
Date Time Interrogation Session: 20211006042027
Implantable Lead Implant Date: 20160329
Implantable Lead Implant Date: 20160329
Implantable Lead Implant Date: 20210406
Implantable Lead Location: 753858
Implantable Lead Location: 753859
Implantable Lead Location: 753860
Implantable Lead Model: 1948
Implantable Pulse Generator Implant Date: 20210406
Lead Channel Impedance Value: 430 Ohm
Lead Channel Impedance Value: 680 Ohm
Lead Channel Impedance Value: 750 Ohm
Lead Channel Pacing Threshold Amplitude: 0.5 V
Lead Channel Pacing Threshold Amplitude: 0.625 V
Lead Channel Pacing Threshold Amplitude: 1.25 V
Lead Channel Pacing Threshold Pulse Width: 0.5 ms
Lead Channel Pacing Threshold Pulse Width: 0.5 ms
Lead Channel Pacing Threshold Pulse Width: 0.5 ms
Lead Channel Sensing Intrinsic Amplitude: 12 mV
Lead Channel Sensing Intrinsic Amplitude: 3.4 mV
Lead Channel Setting Pacing Amplitude: 2 V
Lead Channel Setting Pacing Amplitude: 2.25 V
Lead Channel Setting Pacing Amplitude: 5 V
Lead Channel Setting Pacing Pulse Width: 0.5 ms
Lead Channel Setting Pacing Pulse Width: 0.5 ms
Lead Channel Setting Sensing Sensitivity: 4 mV
Pulse Gen Model: 3562
Pulse Gen Serial Number: 6058048

## 2020-06-03 NOTE — Progress Notes (Signed)
Remote pacemaker transmission.   

## 2020-06-12 ENCOUNTER — Ambulatory Visit (HOSPITAL_COMMUNITY): Payer: Medicare Other | Attending: Cardiology

## 2020-06-12 ENCOUNTER — Other Ambulatory Visit: Payer: Self-pay

## 2020-06-12 ENCOUNTER — Ambulatory Visit (INDEPENDENT_AMBULATORY_CARE_PROVIDER_SITE_OTHER): Payer: Medicare Other

## 2020-06-12 DIAGNOSIS — Z950 Presence of cardiac pacemaker: Secondary | ICD-10-CM

## 2020-06-12 DIAGNOSIS — I340 Nonrheumatic mitral (valve) insufficiency: Secondary | ICD-10-CM | POA: Diagnosis present

## 2020-06-12 DIAGNOSIS — I5022 Chronic systolic (congestive) heart failure: Secondary | ICD-10-CM

## 2020-06-12 LAB — ECHOCARDIOGRAM COMPLETE
AR max vel: 1.67 cm2
AV Area VTI: 1.88 cm2
AV Area mean vel: 1.9 cm2
AV Mean grad: 10 mmHg
AV Peak grad: 14 mmHg
Ao pk vel: 1.87 m/s
Area-P 1/2: 3.77 cm2
MV M vel: 6 m/s
MV Peak grad: 144.1 mmHg
Radius: 0.8 cm
S' Lateral: 3.8 cm

## 2020-06-13 ENCOUNTER — Telehealth: Payer: Self-pay

## 2020-06-13 NOTE — Progress Notes (Signed)
EPIC Encounter for ICM Monitoring  Patient Name: Andrew Marshall is a 86 y.o. male Date: 06/13/2020 Primary Care Physican: Seward Carol, MD Primary Cardiologist:Skains Electrophysiologist:Allred Bi-V Pacing:93% 04/04/2020 Weight:148lbs    Attempted call to patient and unable to reach.  Left detailed message per DPR regarding transmission. Transmission reviewed.   CorVue thoracic impedance normal.  Prescribed:No diuretic  Recommendations:Left voice mail with ICM number and encouraged to call if experiencing any fluid symptoms.  Follow-up plan: ICM clinic phone appointment on11/22/2021. 91 day device clinic remote transmission 08/30/2020.   EP/Cardiology Office Visits:06/22/2020 with Oda Kilts, [place]. 07/04/2020 with Dr Marlou Porch  Copy of ICM check sent to Dr.Allred.     3 month ICM trend: 06/12/2020    1 Year ICM trend:       Rosalene Billings, RN 06/13/2020 9:51 AM

## 2020-06-13 NOTE — Telephone Encounter (Signed)
Remote ICM transmission received.  Attempted call to patient regarding ICM remote transmission and left detailed message per DPR.  Advised to return call for any fluid symptoms or questions. Next ICM remote transmission scheduled 07/17/2020.

## 2020-06-17 ENCOUNTER — Other Ambulatory Visit: Payer: Self-pay | Admitting: Cardiology

## 2020-06-19 ENCOUNTER — Encounter: Payer: Medicare Other | Admitting: Student

## 2020-06-21 NOTE — Progress Notes (Signed)
Electrophysiology Office Note Date: 06/22/2020  ID:  Andrew Marshall, DOB 1931-08-13, MRN [id number]  PCP: Seward Carol, MD Primary Cardiologist: Candee Furbish, MD Electrophysiologist: [name], MD   CC: Pacemaker follow-up  Andrew Marshall is a 86 y.o. male seen today for [name], MD for routine electrophysiology followup.  Since last being seen in our clinic the patient reports doing very well. He continues to ride bikes 3 days a week, ranging from 12-20 miles. He generally goes approx 10 mile per hour. He has no fatigue or SOB with this. He occasional gets mild SOB up stairs, but otherwise is without symptoms. He denies chest pain, palpitations, PND, orthopnea, nausea, vomiting, dizziness, syncope, edema, weight gain, or early satiety.  Device History: St. Jude PPM implanted 2016 as dual chamber with BIV upgrade 11/30/2019 for CHB and NICM  Past Medical History:  Diagnosis Date  . Allergic rhinitis   . Arthritis    "thumbs primarily" (05/05/2015)  . Basal cell carcinoma, face    "have had several; cut and burned off"  . CAD (coronary artery disease)    a. 2001 s/p post MI- multiple PCI's to LCX/OM;  b. 2003 DES to OM;  c. 05/2012 MV: postlat infarct w/ mild peri-infarct ischemia. nl EF->Med Rx.  . Complete heart block ([place])    St. Jude pacemaker implanted 11/22/14  . ED (erectile dysfunction)   . Esophageal reflux    "slight"  . Essential hypertension, benign   . History of atrial fibrillation   . History of GI diverticular bleed    a. 05/2013 and 02/2014 - taken off of ASA for this reason.  . Metatarsalgia   . MI (myocardial infarction) ([place]) 2001  . Moderate mitral insufficiency    a. 10/2014 Echo: EF 55-60%, Gr1 DD, mild AS, mod MR, sev dil LA, nl RV, mildly dil RA, mild TR, PASP 14mmHg.  Marland Kitchen Pes planus   . Presence of permanent cardiac pacemaker   . 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 Prostate cancer ([place]) 2008   S/P "radiation and seed implants"   . Pure hypercholesterolemia   . RLS (restless legs syndrome)    Past Surgical History:  Procedure Laterality Date  . APPENDECTOMY  1956  . BASAL CELL CARCINOMA EXCISION     "face"  . BIV UPGRADE N/A 11/30/2019   Procedure: BIV PPM UPGRADE;  Surgeon: [name], MD;  Location: [facility];  Service: Cardiovascular;  Laterality: N/A;  . CARDIAC CATHETERIZATION N/A 05/05/2015   Procedure: Left Heart Cath and Coronary Angiography;  Surgeon: Belva Crome, MD;  Location: [place];  Service: Cardiovascular;  Laterality: N/A;  . CARDIAC CATHETERIZATION N/A 05/05/2015   Procedure: Coronary Stent Intervention;  Surgeon: Belva Crome, MD;  Location: [facility];  Service: Cardiovascular;  Laterality: N/A;  . CATARACT EXTRACTION W/ INTRAOCULAR LENS IMPLANT Left 12/2014  . COLONOSCOPY W/ POLYPECTOMY    . CORONARY ANGIOPLASTY    . CORONARY ANGIOPLASTY WITH STENT PLACEMENT  04/2000; ? ~ 2002; 01/2002  . ESTERNAL BEAM XRT AND SEEDS FOR PROSTATE CANCER    . EYE SURGERY Right 07/2016  . FOOT ARTHRODESIS, TRIPLE Right 2007  . GANGLION CYST EXCISION Right 1970's   wrist  . INSERT / REPLACE / REMOVE PACEMAKER  2017  . LEFT HEART CATH AND CORONARY ANGIOGRAPHY N/A 09/19/2017   Procedure: LEFT HEART CATH AND CORONARY ANGIOGRAPHY;  Surgeon: Burnell Blanks, MD;  Location: [facility]  INVASIVE CV LAB;  Service: Cardiovascular;  Laterality: N/A;  . LUMBAR LAMINECTOMY/DECOMPRESSION MICRODISCECTOMY Left 09/18/2016   Procedure: LEFT LUMBAR THREE- LUMBAR FOUR LUMBAR LAMINOTOMY AND MICRODISCECTOMY;  Surgeon: Jovita Gamma, MD;  Location: [facility];  Service: Neurosurgery;  Laterality: Left;  LEFT LUMBAR 3- LUMBAR 4 LUMBAR LAMINOTOMY AND MICRODISCECTOMY  . PERMANENT PACEMAKER INSERTION N/A 11/22/2014   Procedure: PERMANENT PACEMAKER INSERTION;  Surgeon: [name], MD;  Location: [facility] CATH LAB;  Service: Cardiovascular;  Laterality: N/A;  . PROSTATE BIOPSY    . TONSILLECTOMY  1942    Current  Outpatient Medications  Medication Sig Dispense Refill  . atorvastatin (LIPITOR) 40 MG tablet TAKE ONE TABLET BY MOUTH DAILY 90 tablet 1  . Carboxymethylcellul-Glycerin (LUBRICATING EYE DROPS OP) Place 1 drop into both eyes daily as needed (dry eyes).    . carvedilol (COREG) 12.5 MG tablet TAKE ONE TABLET BY MOUTH TWO TIMES A DAY WITH MEAL 180 tablet 3  . cetirizine (ZYRTEC) 10 MG tablet Take 10 mg by mouth daily.     . clopidogrel (PLAVIX) 75 MG tablet TAKE ONE TABLET BY MOUTH DAILY 90 tablet 3  . ENTRESTO 24-26 MG TAKE ONE TABLET BY MOUTH TWICE A DAY 60 tablet 11  . nitroGLYCERIN (NITROSTAT) 0.4 MG SL tablet Place 1 tablet (0.4 mg total) under the tongue every 5 (five) minutes as needed for chest pain (MAX 3 TABLETS). 25 tablet 3   No current facility-administered medications for this visit.    Allergies:   Ambien [zolpidem] and Erythromycin   Social History: Social History   Socioeconomic History  . Marital status: Married    Spouse name: Not on file  . Number of children: Not on file  . Years of education: Not on file  . Highest education level: Not on file  Occupational History  . Not on file  Tobacco Use  . Smoking status: Former Smoker    Packs/day: 0.50    Years: 50.00    Pack years: 25.00    Types: Cigarettes    Quit date: 05/22/2000    Years since quitting: 20.0  . Smokeless tobacco: Never Used  Vaping Use  . Vaping Use: Never used  Substance and Sexual Activity  . Alcohol use: Yes    Comment: 05/05/2015 "3-6 glasses of wine/day"  . Drug use: No  . Sexual activity: Yes  Other Topics Concern  . Not on file  Social History Narrative  . Not on file   Social Determinants of Health   Financial Resource Strain:   . Difficulty of Paying Living Expenses: Not on file  Food Insecurity:   . Worried About Charity fundraiser in the Last Year: Not on file  . Ran Out of Food in the Last Year: Not on file  Transportation Needs:   . Lack of Transportation (Medical): Not  on file  . Lack of Transportation (Non-Medical): Not on file  Physical Activity:   . Days of Exercise per Week: Not on file  . Minutes of Exercise per Session: Not on file  Stress:   . Feeling of Stress : Not on file  Social Connections:   . Frequency of Communication with Friends and Family: Not on file  . Frequency of Social Gatherings with Friends and Family: Not on file  . Attends Religious Services: Not on file  . Active Member of Clubs or Organizations: Not on file  . Attends Archivist Meetings: Not on file  . Marital Status: Not on file  Intimate Partner Violence:   . Fear of Current or Ex-Partner: Not on file  . Emotionally Abused: Not on file  . Physically Abused: Not on file  . Sexually Abused: Not on file    Family History: Family History  Problem Relation Age of Onset  . Breast cancer Mother   . Heart disease Father      Review of Systems: All other systems reviewed and are otherwise negative except as noted above.  Physical Exam: Vitals:   06/22/20 1220  BP: 116/64  Pulse: 65  SpO2: 98%  Weight: 156 lb (70.8 kg)  Height: 5\' 8"  (1.727 m)     GEN- The patient is well appearing, alert and oriented x 3 today.   HEENT: normocephalic, atraumatic; sclera clear, conjunctiva pink; hearing intact; oropharynx clear; neck supple  Lungs- Clear to ausculation bilaterally, normal work of breathing.  No wheezes, rales, rhonchi Heart- Regular rate and rhythm, no murmurs, rubs or gallops  GI- soft, non-tender, non-distended, bowel sounds present  Extremities- no clubbing or cyanosis. No edema MS- no significant deformity or atrophy Skin- warm and dry, no rash or lesion; PPM pocket well healed Psych- euthymic mood, full affect Neuro- strength and sensation are intact  PPM Interrogation- reviewed in detail today,  See PACEART report  EKG:  EKG is ordered today. The ekg ordered today shows AV dual paced with QRS 108 ms, PR interval of 146 ms  Recent  Labs: 11/26/2019: BUN 22; Creatinine, Ser 0.97; Hemoglobin 13.1; Platelets 172; Potassium 4.8; Sodium 142   Wt Readings from Last 3 Encounters:  06/22/20 156 lb (70.8 kg)  03/13/20 162 lb 12.8 oz (73.8 kg)  11/30/19 155 lb (70.3 kg)     Other studies Reviewed: Additional studies/ records that were reviewed today include: Previous EP office notes, Previous remote checks, Most recent labwork.   Assessment and Plan:  1. CHB s/p St. Jude PPM  Normal PPM function See Pace Art report No changes today  2. NICM s/p BIV upgrade Echo 06/12/2020 shows EF has improved to 35-40% range s/p BIV upgrade.  NYHA I symptoms, narrow QRS by EKG today.  He is stable on coreg and entresto. Would be hesitant to use spironolactone given chronic high normal potassium and stable symptoms. I offered to decrease his rate response threshold to help him have less SOB with stairs, but as he is riding his bike for miles without symptoms, he would like to leave his settings in place today.   3. Paroxysmal atrial fibrillation <1% burden. Longest AMS 32 seconds. Continue to follow, and consider [place] if burden increases.  4. HTN Continue current regimen  Current medicines are reviewed at length with the patient today.   The patient does not have concerns regarding his medicines.  The following changes were made today:  none  Labs/ tests ordered today include:  Orders Placed This Encounter  Procedures  . CUP PACEART [place]  . EKG 12-Lead   Disposition:   Follow up with EP APP in 6 months. Sooner with symptoms.    Jacalyn Lefevre, PA-C  06/22/2020 2:58 PM  [address] [place] [address] [phone] (office) [phone] (fax)

## 2020-06-22 ENCOUNTER — Ambulatory Visit (INDEPENDENT_AMBULATORY_CARE_PROVIDER_SITE_OTHER): Payer: Medicare Other | Admitting: Student

## 2020-06-22 ENCOUNTER — Encounter: Payer: Self-pay | Admitting: Student

## 2020-06-22 ENCOUNTER — Other Ambulatory Visit: Payer: Self-pay

## 2020-06-22 VITALS — BP 116/64 | HR 65 | Ht 68.0 in | Wt 156.0 lb

## 2020-06-22 DIAGNOSIS — I442 Atrioventricular block, complete: Secondary | ICD-10-CM

## 2020-06-22 DIAGNOSIS — I480 Paroxysmal atrial fibrillation: Secondary | ICD-10-CM

## 2020-06-22 DIAGNOSIS — I10 Essential (primary) hypertension: Secondary | ICD-10-CM | POA: Diagnosis not present

## 2020-06-22 DIAGNOSIS — Z950 Presence of cardiac pacemaker: Secondary | ICD-10-CM

## 2020-06-22 LAB — CUP PACEART INCLINIC DEVICE CHECK
Battery Remaining Longevity: 49 mo
Battery Voltage: 2.98 V
Brady Statistic RA Percent Paced: 87 %
Brady Statistic RV Percent Paced: 93 %
Date Time Interrogation Session: 20211028144934
Implantable Lead Implant Date: 20160329
Implantable Lead Implant Date: 20160329
Implantable Lead Implant Date: 20210406
Implantable Lead Location: 753858
Implantable Lead Location: 753859
Implantable Lead Location: 753860
Implantable Lead Model: 1948
Implantable Pulse Generator Implant Date: 20210406
Lead Channel Impedance Value: 412.5 Ohm
Lead Channel Impedance Value: 687.5 Ohm
Lead Channel Impedance Value: 750 Ohm
Lead Channel Pacing Threshold Amplitude: 0.625 V
Lead Channel Pacing Threshold Amplitude: 0.625 V
Lead Channel Pacing Threshold Amplitude: 1.375 V
Lead Channel Pacing Threshold Pulse Width: 0.5 ms
Lead Channel Pacing Threshold Pulse Width: 0.5 ms
Lead Channel Pacing Threshold Pulse Width: 0.5 ms
Lead Channel Sensing Intrinsic Amplitude: 1.5 mV
Lead Channel Sensing Intrinsic Amplitude: 10.9 mV
Lead Channel Setting Pacing Amplitude: 1.625
Lead Channel Setting Pacing Amplitude: 2 V
Lead Channel Setting Pacing Amplitude: 2.375
Lead Channel Setting Pacing Pulse Width: 0.5 ms
Lead Channel Setting Pacing Pulse Width: 0.5 ms
Lead Channel Setting Sensing Sensitivity: 4 mV
Pulse Gen Model: 3562
Pulse Gen Serial Number: 6058048

## 2020-06-22 NOTE — Patient Instructions (Signed)
Medication Instructions:  *If you need a refill on your cardiac medications before your next appointment, please call your pharmacy*  Follow-Up: At [facility], you and your health needs are our priority.  As part of our continuing mission to provide you with exceptional heart care, we have created designated Provider Care Teams.  These Care Teams include your primary Cardiologist (physician) and Advanced Practice Providers (APPs -  Physician Assistants and Nurse Practitioners) who all work together to provide you with the care you need, when you need it.  We recommend signing up for the patient portal called "MyChart".  Sign up information is provided on this After Visit Summary.  MyChart is used to connect with patients for Virtual Visits (Telemedicine).  Patients are able to view lab/test results, encounter notes, upcoming appointments, etc.  Non-urgent messages can be sent to your provider as well.   To learn more about what you can do with MyChart, go to [email or web address].    Your next appointment:   Your physician wants you to follow-up in: 6 MONTHS with Oda Kilts, PA-C. You will receive a reminder letter in the mail two months in advance. If you don't receive a letter, please call our office to schedule the follow-up appointment.  Remote monitoring is used to monitor your Pacemaker from home. This monitoring reduces the number of office visits required to check your device to one time per year. It allows [place] to keep an eye on the functioning of your device to ensure it is working properly. You are scheduled for a device check from home on 07/17/20. You may send your transmission at any time that day. If you have a wireless device, the transmission will be sent automatically. After your physician reviews your transmission, you will receive a postcard with your next transmission date.  The format for your next appointment:   In Person with Legrand Como "Jonni Sanger" Chalmers Cater, PA-C

## 2020-06-29 ENCOUNTER — Other Ambulatory Visit: Payer: Self-pay | Admitting: Cardiology

## 2020-07-04 ENCOUNTER — Encounter: Payer: Self-pay | Admitting: Cardiology

## 2020-07-04 ENCOUNTER — Other Ambulatory Visit: Payer: Self-pay

## 2020-07-04 ENCOUNTER — Ambulatory Visit: Payer: Medicare Other | Admitting: Cardiology

## 2020-07-04 VITALS — BP 116/70 | HR 61 | Ht 69.0 in | Wt 160.6 lb

## 2020-07-04 DIAGNOSIS — I2510 Atherosclerotic heart disease of native coronary artery without angina pectoris: Secondary | ICD-10-CM | POA: Diagnosis not present

## 2020-07-04 DIAGNOSIS — I428 Other cardiomyopathies: Secondary | ICD-10-CM

## 2020-07-04 DIAGNOSIS — I442 Atrioventricular block, complete: Secondary | ICD-10-CM

## 2020-07-04 NOTE — Progress Notes (Signed)
Cardiology Office Note:    Date:  07/04/2020   ID:  Andrew Marshall, DOB 03/24/1931, MRN [id number]  PCP:  Seward Carol, MD  [facility] Cardiologist:  Candee Furbish, MD  [facility] Electrophysiologist:  [name], MD   Referring MD: Seward Carol, MD    History of Present Illness:    Andrew Marshall is a 86 y.o. male here for follow-up, pacemaker.  Implanted 2016 with upgrade 2021 biventricular for complete heart block and nonischemic cardiomyopathy.  Doing well.  Rides bikes 3 days a week.  12 to 20 miles.  Excellent.  No fevers chills nausea vomiting syncope bleeding.  EF is 25% on echo 2020 with moderate MR. EF increased to 40% with biventricular upgrade, Dr. [name].  Excellent.  Former patient of Dr. Thurman Coyer.  Is on low-dose Entresto, blood pressure soft.  Has some easy bruising bleeding, Plavix monotherapy.  Multiple PCI's in the past with early generation stenting.  Feels well.  He and his wife are contemplating [facility].  Denies any fevers chills nausea vomiting syncope bleeding.  Cath 2019  Prox RCA to Mid RCA lesion is 30% stenosed.  Prox Cx to Mid Cx lesion is 20% stenosed.  Ost 2nd Mrg lesion is 70% stenosed.  Prox LAD lesion is 20% stenosed.  1. Single vessel CAD 2. Patent stents proximal and mid Circumflex with mild restenosis.  3. The small caliber obtuse marginal branch is jailed by the Circumflex stent. The ostium of the obtuse marginal branch has a 70-80% stenosis which is unchanged from 2016 at the time of deployment of the circumflex stent.  4. Mild non-obstructive disease in the large, dominant RCA and in the LAD which reaches the apex.  5. Mild elevation LV filling pressures  Recommendations: Continue medical management of CAD. Consider low dose lasix given elevated filling pressures.   ECHO 2020   1. The left ventricle has not been assessed 25%. The cavity size was  normal. Left ventricular diastolic Doppler parameters are consistent  with  impaired relaxation.  2. LVEF is approximately 25% with diffuse hypokinesis, worse in the  anterior and lateral walls.  3. The right ventricle has normal systolic function. The cavity was  normal. There is mildly increased right ventricular wall thickness.  4. Mitral valve regurgitation is moderate by color flow Doppler.   ECHO 2021:   1. Left ventricular ejection fraction, by estimation, is 35 to 40%. The  left ventricle has moderately decreased function. The left ventricle  demonstrates regional wall motion abnormalities with basal to mid  inferolateral and anterolateral akinesis.  There is mild left ventricular hypertrophy. Left ventricular diastolic  parameters are consistent with Grade II diastolic dysfunction  (pseudonormalization).  2. Right ventricular systolic function is normal. The right ventricular  size is mildly enlarged. There is normal pulmonary artery systolic  pressure. The estimated right ventricular systolic pressure is 91.6 mmHg.  3. Left atrial size was severely dilated.  4. Right atrial size was mildly dilated.  5. The mitral valve is abnormal. Moderate to severe mitral valve  regurgitation. Suspect infarct-related MR with akinetic lateral wall,  moderate MR by PISA ERO (0.21 cm^2). No evidence of mitral stenosis.  6. The aortic valve is tricuspid. Aortic valve regurgitation is not  visualized. Mild aortic valve stenosis. Aortic valve area, by VTI measures  1.88 cm. Aortic valve mean gradient measures 10.0 mmHg.  7. The inferior vena cava is normal in size with greater than 50%  respiratory variability, suggesting right atrial pressure  of 3 mmHg.   Past Medical History:  Diagnosis Date  . Allergic rhinitis   . Arthritis    "thumbs primarily" (05/05/2015)  . Basal cell carcinoma, face    "have had several; cut and burned off"  . CAD (coronary artery disease)    a. 2001 s/p post MI- multiple PCI's to LCX/OM;  b. 2003 DES to OM;  c. 05/2012  MV: postlat infarct w/ mild peri-infarct ischemia. nl EF->Med Rx.  . Complete heart block ([place])    St. Jude pacemaker implanted 11/22/14  . ED (erectile dysfunction)   . Esophageal reflux    "slight"  . Essential hypertension, benign   . History of atrial fibrillation   . History of GI diverticular bleed    a. 05/2013 and 02/2014 - taken off of ASA for this reason.  . Metatarsalgia   . MI (myocardial infarction) ([place]) 2001  . Moderate mitral insufficiency    a. 10/2014 Echo: EF 55-60%, Gr1 DD, mild AS, mod MR, sev dil LA, nl RV, mildly dil RA, mild TR, PASP 61mmHg.  Marland Kitchen Pes planus   . Presence of permanent cardiac pacemaker   . 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 Prostate cancer ([place]) 2008   S/P "radiation and seed implants"  . Pure hypercholesterolemia   . RLS (restless legs syndrome)     Past Surgical History:  Procedure Laterality Date  . APPENDECTOMY  1956  . BASAL CELL CARCINOMA EXCISION     "face"  . BIV UPGRADE N/A 11/30/2019   Procedure: BIV PPM UPGRADE;  Surgeon: [name], MD;  Location: [place] [facility];  Service: Cardiovascular;  Laterality: N/A;  . CARDIAC CATHETERIZATION N/A 05/05/2015   Procedure: Left Heart Cath and Coronary Angiography;  Surgeon: Belva Crome, MD;  Location: [place] [facility];  Service: Cardiovascular;  Laterality: N/A;  . CARDIAC CATHETERIZATION N/A 05/05/2015   Procedure: Coronary Stent Intervention;  Surgeon: Belva Crome, MD;  Location: [place] [facility];  Service: Cardiovascular;  Laterality: N/A;  . CATARACT EXTRACTION W/ INTRAOCULAR LENS IMPLANT Left 12/2014  . COLONOSCOPY W/ POLYPECTOMY    . CORONARY ANGIOPLASTY    . CORONARY ANGIOPLASTY WITH STENT PLACEMENT  04/2000; ? ~ 2002; 01/2002  . ESTERNAL BEAM XRT AND SEEDS FOR PROSTATE CANCER    . EYE SURGERY Right 07/2016  . FOOT ARTHRODESIS, TRIPLE Right 2007  . GANGLION CYST EXCISION Right 1970's   wrist  . INSERT / REPLACE / REMOVE PACEMAKER  2017  . LEFT HEART CATH  AND CORONARY ANGIOGRAPHY N/A 09/19/2017   Procedure: LEFT HEART CATH AND CORONARY ANGIOGRAPHY;  Surgeon: Burnell Blanks, MD;  Location: [place] [facility];  Service: Cardiovascular;  Laterality: N/A;  . LUMBAR LAMINECTOMY/DECOMPRESSION MICRODISCECTOMY Left 09/18/2016   Procedure: LEFT LUMBAR THREE- LUMBAR FOUR LUMBAR LAMINOTOMY AND MICRODISCECTOMY;  Surgeon: Jovita Gamma, MD;  Location: [place];  Service: Neurosurgery;  Laterality: Left;  LEFT LUMBAR 3- LUMBAR 4 LUMBAR LAMINOTOMY AND MICRODISCECTOMY  . PERMANENT PACEMAKER INSERTION N/A 11/22/2014   Procedure: PERMANENT PACEMAKER INSERTION;  Surgeon: [name], MD;  Location: [facility] CATH LAB;  Service: Cardiovascular;  Laterality: N/A;  . PROSTATE BIOPSY    . TONSILLECTOMY  1942    Current Medications: Current Meds  Medication Sig  . atorvastatin (LIPITOR) 40 MG tablet TAKE ONE TABLET BY MOUTH DAILY  . Carboxymethylcellul-Glycerin (LUBRICATING EYE DROPS OP) Place 1 drop into both eyes daily as needed (dry eyes).  . carvedilol (COREG) 12.5 MG  tablet TAKE ONE TABLET BY MOUTH TWO TIMES A DAY WITH MEAL  . cetirizine (ZYRTEC) 10 MG tablet Take 10 mg by mouth daily.   . clopidogrel (PLAVIX) 75 MG tablet TAKE ONE TABLET BY MOUTH DAILY  . ENTRESTO 24-26 MG TAKE ONE TABLET BY MOUTH TWICE A DAY  . nitroGLYCERIN (NITROSTAT) 0.4 MG SL tablet Place 1 tablet (0.4 mg total) under the tongue every 5 (five) minutes as needed for chest pain (MAX 3 TABLETS).  Marland Kitchen ofloxacin (FLOXIN) 0.3 % OTIC solution Place 5 drops into the right ear 2 times daily for 10 days. Wait 1 hour before putting the right hearing aid and after using the drops.     Allergies:   Ambien [zolpidem] and Erythromycin   Social History   Socioeconomic History  . Marital status: Married    Spouse name: Not on file  . Number of children: Not on file  . Years of education: Not on file  . Highest education level: Not on file  Occupational History  . Not on file  Tobacco Use  .  Smoking status: Former Smoker    Packs/day: 0.50    Years: 50.00    Pack years: 25.00    Types: Cigarettes    Quit date: 05/22/2000    Years since quitting: 20.1  . Smokeless tobacco: Never Used  Vaping Use  . Vaping Use: Never used  Substance and Sexual Activity  . Alcohol use: Yes    Comment: 05/05/2015 "3-6 glasses of wine/day"  . Drug use: No  . Sexual activity: Yes  Other Topics Concern  . Not on file  Social History Narrative  . Not on file   Social Determinants of Health   Financial Resource Strain:   . Difficulty of Paying Living Expenses: Not on file  Food Insecurity:   . Worried About Charity fundraiser in the Last Year: Not on file  . Ran Out of Food in the Last Year: Not on file  Transportation Needs:   . Lack of Transportation (Medical): Not on file  . Lack of Transportation (Non-Medical): Not on file  Physical Activity:   . Days of Exercise per Week: Not on file  . Minutes of Exercise per Session: Not on file  Stress:   . Feeling of Stress : Not on file  Social Connections:   . Frequency of Communication with Friends and Family: Not on file  . Frequency of Social Gatherings with Friends and Family: Not on file  . Attends Religious Services: Not on file  . Active Member of Clubs or Organizations: Not on file  . Attends Archivist Meetings: Not on file  . Marital Status: Not on file     Family History: The patient's family history includes Breast cancer in his mother; Heart disease in his father.  ROS:   Please see the history of present illness.     All other systems reviewed and are negative.  EKGs/Labs/Other Studies Reviewed:    The following studi4/09/2019: BUN 22; Creatinine, Ser 0.97; Hemoglobin 13.1; Platelets 172; Potassium 4.8; Sodium 142  Recent Lipid Panel    Component Value Date/Time   CHOL 121 05/06/2019 1109   TRIG 60 05/06/2019 1109   HDL 58 05/06/2019 1109   CHOLHDL 2.1 05/06/2019 1109   CHOLHDL 3 04/12/2014 0951   VLDL  21.8 04/12/2014 0951   LDLCALC 50 05/06/2019 1109     Physical Exam:    VS:  BP 116/70   Pulse 61  Ht 5\' 9"  (1.753 m)   Wt 160 lb 9.6 oz (72.8 kg)   SpO2 96%   BMI 23.72 kg/m     Wt Readings from Last 3 Encounters:  07/04/20 160 lb 9.6 oz (72.8 kg)  06/22/20 156 lb (70.8 kg)  03/13/20 162 lb 12.8 oz (73.8 kg)     GEN:  Well nourished, well developed in no acute distress HEENT: Normal NECK: No JVD; No carotid bruits LYMPHATICS: No lymphadenopathy CARDIAC: RRR, no murmurs, rubs, gallops RESPIRATORY:  Clear to auscultation without rales, wheezing or rhonchi  ABDOMEN: Soft, non-tender, non-distended MUSCULOSKELETAL:  No edema; No deformity  SKIN: Warm and dry NEUROLOGIC:  Alert and oriented x 3 PSYCHIATRIC:  Normal affect   ASSESSMENT:    1. Nonischemic cardiomyopathy ([place])   2. Complete heart block ([place])   3. Coronary artery disease involving native coronary artery of native heart without angina pectoris    PLAN:    In order of problems listed above:  Nonischemic cardiomyopathy/chronic systolic heart failure -EF increased to 40% from 25%, BiV upgrade recently.  Doing well.  Continuing with carvedilol 12.5 twice a day as well as Entresto 24/26 twice a day.  Hyperlipidemia -On atorvastatin 40, high intensity dose.  Excellent.  Last LDL 50, ALT 15 normal liver function.  Creatinine 0.9.  History of prior lateral wall myocardial infarction in 2001. -Prior stents as follows: 3.5 x 12 mm stent to proximal circumflex.  Also had in-stent restenosis and had a 3.0 x 24 mm Cypher stent to second obtuse marginal covering the prior stent.  In 2016 had another proximal circumflex stent placed Synergy 2.5 x 12 mm. -He does have some easy bleeding/bruising. Plavix 75 mg monotherapy.  Last hemoglobin 13.1.  Excellent.  No changes made.  67-month follow-up.  Shared Decision Making/Informed Consent      Medication Adjustments/Labs and Tests Ordered: Current medicines are  reviewed at length with the patient today.  Concerns regarding medicines are outlined above.  No orders of the defined types were placed in this encounter.  No orders of the defined types were placed in this encounter.   Patient Instructions  Medication Instructions:  The current medical regimen is effective;  continue present plan and medications.  *If you need a refill on your cardiac medications before your next appointment, please call your pharmacy*  Follow-Up: At [facility], you and your health needs are our priority.  As part of our continuing mission to provide you with exceptional heart care, we have created designated Provider Care Teams.  These Care Teams include your primary Cardiologist (physician) and Advanced Practice Providers (APPs -  Physician Assistants and Nurse Practitioners) who all work together to provide you with the care you need, when you need it.  We recommend signing up for the patient portal called "MyChart".  Sign up information is provided on this After Visit Summary.  MyChart is used to connect with patients for Virtual Visits (Telemedicine).  Patients are able to view lab/test results, encounter notes, upcoming appointments, etc.  Non-urgent messages can be sent to your provider as well.   To learn more about what you can do with MyChart, go to [email or web address].    Your next appointment:   6 month(s)  The format for your next appointment:   In Person  Provider:   Candee Furbish, MD   Thank you for choosing [facility]!!        Signed, Candee Furbish, MD  07/04/2020 12:13 PM  [facility]

## 2020-07-04 NOTE — Patient Instructions (Signed)

## 2020-07-17 ENCOUNTER — Other Ambulatory Visit: Payer: Self-pay | Admitting: Otolaryngology

## 2020-07-17 ENCOUNTER — Ambulatory Visit: Payer: Medicare Other

## 2020-07-17 DIAGNOSIS — H60311 Diffuse otitis externa, right ear: Secondary | ICD-10-CM

## 2020-07-17 DIAGNOSIS — H7293 Unspecified perforation of tympanic membrane, bilateral: Secondary | ICD-10-CM

## 2020-07-17 DIAGNOSIS — I5022 Chronic systolic (congestive) heart failure: Secondary | ICD-10-CM

## 2020-07-17 DIAGNOSIS — Z950 Presence of cardiac pacemaker: Secondary | ICD-10-CM

## 2020-07-17 NOTE — Progress Notes (Signed)
EPIC Encounter for ICM Monitoring  Patient Name: Andrew Marshall is a 86 y.o. male Date: 07/17/2020 Primary Care Physican: Seward Carol, MD Primary Cardiologist:Skains Electrophysiologist:Allred Bi-V Pacing:90% 07/04/2020 Office Weight:160lbs    Spoke with patient and reports feeling well at this time.  Denies fluid symptoms.  He is in the process of moving which is somewhat stressful.  CorVue thoracic impedance normal.  Prescribed:No diuretic  Recommendations:No changes and encouraged to call if experiencing any fluid symptoms.  Follow-up plan: ICM clinic phone appointment on12/27/2021. 91 day device clinic remote transmission 08/30/2020.   EP/Cardiology Office Visits: Recall 09/29/2020 with Dr [name]. 12/31/2020 with Dr Marlou Porch  Copy of ICM check sent to Dr.Allred  3 month ICM trend: 07/17/2020    1 Year ICM trend:       Rosalene Billings, RN 07/17/2020 4:36 PM

## 2020-08-03 ENCOUNTER — Ambulatory Visit
Admission: RE | Admit: 2020-08-03 | Discharge: 2020-08-03 | Disposition: A | Discharge status: Home / Self Care | Payer: Medicare Other | Source: Ambulatory Visit | Attending: Otolaryngology | Admitting: Otolaryngology

## 2020-08-03 ENCOUNTER — Other Ambulatory Visit: Payer: Self-pay

## 2020-08-03 DIAGNOSIS — H60311 Diffuse otitis externa, right ear: Secondary | ICD-10-CM

## 2020-08-03 DIAGNOSIS — H7293 Unspecified perforation of tympanic membrane, bilateral: Secondary | ICD-10-CM

## 2020-08-21 ENCOUNTER — Ambulatory Visit (INDEPENDENT_AMBULATORY_CARE_PROVIDER_SITE_OTHER): Payer: Medicare Other

## 2020-08-21 DIAGNOSIS — Z950 Presence of cardiac pacemaker: Secondary | ICD-10-CM | POA: Diagnosis not present

## 2020-08-21 DIAGNOSIS — I5022 Chronic systolic (congestive) heart failure: Secondary | ICD-10-CM | POA: Diagnosis not present

## 2020-08-22 NOTE — Progress Notes (Signed)
EPIC Encounter for ICM Monitoring  Patient Name: Andrew Marshall is a 86 y.o. male Date: 08/22/2020 Primary Care Physican: Renford Dills, MD Primary Cardiologist:Skains Electrophysiologist:Allred Bi-V Pacing:95% 07/04/2020 Office Weight:160lbs    Spoke with patient and reports feeling well at this time.  Denies fluid symptoms.    CorVue thoracic impedance normal.  Prescribed:No diuretic  Recommendations: No changes and encouraged to call if experiencing any fluid symptoms.  Follow-up plan: ICM clinic phone appointment on2/08/2020. 91 day device clinic remote transmission1/12/2020.   EP/Cardiology Office Visits: Recall 09/29/2020 with Dr Johney Frame. 12/31/2020 with Dr Anne Fu  Copy of ICM check sent to Dr.Allred.   3 month ICM trend: 08/21/2020    1 Year ICM trend:       Karie Soda, RN 08/22/2020 9:03 AM

## 2020-08-30 ENCOUNTER — Ambulatory Visit (INDEPENDENT_AMBULATORY_CARE_PROVIDER_SITE_OTHER): Payer: Medicare Other

## 2020-08-30 DIAGNOSIS — I442 Atrioventricular block, complete: Secondary | ICD-10-CM | POA: Diagnosis not present

## 2020-08-30 LAB — CUP PACEART REMOTE DEVICE CHECK
Battery Remaining Longevity: 83 mo
Battery Remaining Percentage: 95.5 %
Battery Voltage: 2.99 V
Brady Statistic AP VP Percent: 93 %
Brady Statistic AP VS Percent: 1.1 %
Brady Statistic AS VP Percent: 2.4 %
Brady Statistic AS VS Percent: 1 %
Brady Statistic RA Percent Paced: 90 %
Date Time Interrogation Session: 20220105020009
Implantable Lead Implant Date: 20160329
Implantable Lead Implant Date: 20160329
Implantable Lead Implant Date: 20210406
Implantable Lead Location: 753858
Implantable Lead Location: 753859
Implantable Lead Location: 753860
Implantable Lead Model: 1948
Implantable Pulse Generator Implant Date: 20210406
Lead Channel Impedance Value: 410 Ohm
Lead Channel Impedance Value: 650 Ohm
Lead Channel Impedance Value: 740 Ohm
Lead Channel Pacing Threshold Amplitude: 0.625 V
Lead Channel Pacing Threshold Amplitude: 0.625 V
Lead Channel Pacing Threshold Amplitude: 1.625 V
Lead Channel Pacing Threshold Pulse Width: 0.5 ms
Lead Channel Pacing Threshold Pulse Width: 0.5 ms
Lead Channel Pacing Threshold Pulse Width: 0.5 ms
Lead Channel Sensing Intrinsic Amplitude: 12 mV
Lead Channel Sensing Intrinsic Amplitude: 3.8 mV
Lead Channel Setting Pacing Amplitude: 1.625
Lead Channel Setting Pacing Amplitude: 2 V
Lead Channel Setting Pacing Amplitude: 2.625
Lead Channel Setting Pacing Pulse Width: 0.5 ms
Lead Channel Setting Pacing Pulse Width: 0.5 ms
Lead Channel Setting Sensing Sensitivity: 4 mV
Pulse Gen Model: 3562
Pulse Gen Serial Number: 6058048

## 2020-09-13 NOTE — Progress Notes (Signed)
Remote pacemaker transmission.   

## 2020-09-26 ENCOUNTER — Ambulatory Visit (INDEPENDENT_AMBULATORY_CARE_PROVIDER_SITE_OTHER): Payer: Medicare Other

## 2020-09-26 DIAGNOSIS — I5022 Chronic systolic (congestive) heart failure: Secondary | ICD-10-CM | POA: Diagnosis not present

## 2020-09-26 DIAGNOSIS — Z950 Presence of cardiac pacemaker: Secondary | ICD-10-CM

## 2020-09-27 ENCOUNTER — Other Ambulatory Visit: Payer: Self-pay | Admitting: Cardiology

## 2020-09-27 ENCOUNTER — Other Ambulatory Visit: Payer: Self-pay | Admitting: Internal Medicine

## 2020-09-27 ENCOUNTER — Telehealth: Payer: Self-pay

## 2020-09-27 NOTE — Progress Notes (Signed)
EPIC Encounter for ICM Monitoring  Patient Name: Andrew Marshall is a 87 y.o. male Date: 09/27/2020 Primary Care Physican: Seward Carol, MD Primary Cardiologist:Skains Electrophysiologist:Allred Bi-V Pacing:96% 11/9/2021OfficeWeight:160lbs    Attempted call to patient and unable to reach. Transmission reviewed.   CorVue thoracic impedance suggesting possible fluid accumulation starting 09/20/2020 but trending back close to baseline.  Prescribed:No diuretic  Recommendations: Unable to reach.    Follow-up plan: ICM clinic phone appointment on2/14/2022 to recheck fluid levels. 91 day device clinic remote transmission4/01/2021.   EP/Cardiology Office Visits: 10/05/2020 with Dr [name]. 5/10/2022with Dr Marlou Porch  Copy of ICM check sent to [facility] and Dr Marlou Porch.   3 month ICM trend: 09/26/2020.    1 Year ICM trend:       Rosalene Billings, RN 09/27/2020 1:39 PM

## 2020-09-27 NOTE — Telephone Encounter (Signed)
Remote ICM transmission received.  Attempted call to patient regarding ICM remote transmission and no answer or voice mail option.  

## 2020-10-02 DIAGNOSIS — R197 Diarrhea, unspecified: Secondary | ICD-10-CM | POA: Diagnosis not present

## 2020-10-05 ENCOUNTER — Ambulatory Visit: Payer: Medicare Other | Admitting: Internal Medicine

## 2020-10-05 ENCOUNTER — Encounter: Payer: Self-pay | Admitting: Internal Medicine

## 2020-10-05 ENCOUNTER — Other Ambulatory Visit: Payer: Self-pay

## 2020-10-05 VITALS — BP 126/84 | HR 73 | Ht 69.0 in | Wt 150.4 lb

## 2020-10-05 DIAGNOSIS — I5022 Chronic systolic (congestive) heart failure: Secondary | ICD-10-CM

## 2020-10-05 DIAGNOSIS — I428 Other cardiomyopathies: Secondary | ICD-10-CM

## 2020-10-05 DIAGNOSIS — I442 Atrioventricular block, complete: Secondary | ICD-10-CM

## 2020-10-05 LAB — CUP PACEART INCLINIC DEVICE CHECK
Battery Remaining Longevity: 91 mo
Battery Voltage: 2.99 V
Brady Statistic RA Percent Paced: 92 %
Brady Statistic RV Percent Paced: 97 %
Date Time Interrogation Session: 20220210161537
Implantable Lead Implant Date: 20160329
Implantable Lead Implant Date: 20160329
Implantable Lead Implant Date: 20210406
Implantable Lead Location: 753858
Implantable Lead Location: 753859
Implantable Lead Location: 753860
Implantable Lead Model: 1948
Implantable Pulse Generator Implant Date: 20210406
Lead Channel Impedance Value: 437.5 Ohm
Lead Channel Impedance Value: 700 Ohm
Lead Channel Impedance Value: 800 Ohm
Lead Channel Pacing Threshold Amplitude: 0.625 V
Lead Channel Pacing Threshold Amplitude: 1 V
Lead Channel Pacing Threshold Amplitude: 2 V
Lead Channel Pacing Threshold Pulse Width: 0.5 ms
Lead Channel Pacing Threshold Pulse Width: 0.5 ms
Lead Channel Pacing Threshold Pulse Width: 0.5 ms
Lead Channel Sensing Intrinsic Amplitude: 12 mV
Lead Channel Sensing Intrinsic Amplitude: 3.7 mV
Lead Channel Setting Pacing Amplitude: 2 V
Lead Channel Setting Pacing Amplitude: 2 V
Lead Channel Setting Pacing Amplitude: 2.5 V
Lead Channel Setting Pacing Pulse Width: 0.5 ms
Lead Channel Setting Pacing Pulse Width: 0.5 ms
Lead Channel Setting Sensing Sensitivity: 4 mV
Pulse Gen Model: 3562
Pulse Gen Serial Number: 6058048

## 2020-10-05 NOTE — Progress Notes (Signed)
PCP: Seward Carol, MD Primary Cardiologist: Dr Marlou Porch Primary EP:  Dr Dema Severin Andrew Marshall is a 87 y.o. male who presents today for routine electrophysiology followup.  Since last being seen in our clinic, the patient reports doing very well.  He recently sold his house.  He is in an apartment and on the waiting list for [facility].  Today, he denies symptoms of palpitations, chest pain, shortness of breath,  lower extremity edema, dizziness, presyncope, or syncope.  The patient is otherwise without complaint today.   Past Medical History:  Diagnosis Date  . Allergic rhinitis   . Arthritis    "thumbs primarily" (05/05/2015)  . Basal cell carcinoma, face    "have had several; cut and burned off"  . CAD (coronary artery disease)    a. 2001 s/p post MI- multiple PCI's to LCX/OM;  b. 2003 DES to OM;  c. 05/2012 MV: postlat infarct w/ mild peri-infarct ischemia. nl EF->Med Rx.  . Complete heart block ([place])    St. Jude pacemaker implanted 11/22/14  . ED (erectile dysfunction)   . Esophageal reflux    "slight"  . Essential hypertension, benign   . History of atrial fibrillation   . History of GI diverticular bleed    a. 05/2013 and 02/2014 - taken off of ASA for this reason.  . Metatarsalgia   . MI (myocardial infarction) ([place]) 2001  . Moderate mitral insufficiency    a. 10/2014 Echo: EF 55-60%, Gr1 DD, mild AS, mod MR, sev dil LA, nl RV, mildly dil RA, mild TR, PASP 56mmHg.  Marland Kitchen Pes planus   . Presence of permanent cardiac pacemaker   . 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 Prostate cancer ([place]) 2008   S/P "radiation and seed implants"  . Pure hypercholesterolemia   . RLS (restless legs syndrome)    Past Surgical History:  Procedure Laterality Date  . APPENDECTOMY  1956  . BASAL CELL CARCINOMA EXCISION     "face"  . BIV UPGRADE N/A 11/30/2019   Procedure: BIV PPM UPGRADE;  Surgeon: [name], MD;  Location: [place] [facility];  Service: Cardiovascular;   Laterality: N/A;  . CARDIAC CATHETERIZATION N/A 05/05/2015   Procedure: Left Heart Cath and Coronary Angiography;  Surgeon: Belva Crome, MD;  Location: [place] [facility];  Service: Cardiovascular;  Laterality: N/A;  . CARDIAC CATHETERIZATION N/A 05/05/2015   Procedure: Coronary Stent Intervention;  Surgeon: Belva Crome, MD;  Location: [place] [facility];  Service: Cardiovascular;  Laterality: N/A;  . CATARACT EXTRACTION W/ INTRAOCULAR LENS IMPLANT Left 12/2014  . COLONOSCOPY W/ POLYPECTOMY    . CORONARY ANGIOPLASTY    . CORONARY ANGIOPLASTY WITH STENT PLACEMENT  04/2000; ? ~ 2002; 01/2002  . ESTERNAL BEAM XRT AND SEEDS FOR PROSTATE CANCER    . EYE SURGERY Right 07/2016  . FOOT ARTHRODESIS, TRIPLE Right 2007  . GANGLION CYST EXCISION Right 1970's   wrist  . INSERT / REPLACE / REMOVE PACEMAKER  2017  . LEFT HEART CATH AND CORONARY ANGIOGRAPHY N/A 09/19/2017   Procedure: LEFT HEART CATH AND CORONARY ANGIOGRAPHY;  Surgeon: Burnell Blanks, MD;  Location: [place] [facility];  Service: Cardiovascular;  Laterality: N/A;  . LUMBAR LAMINECTOMY/DECOMPRESSION MICRODISCECTOMY Left 09/18/2016   Procedure: LEFT LUMBAR THREE- LUMBAR FOUR LUMBAR LAMINOTOMY AND MICRODISCECTOMY;  Surgeon: Jovita Gamma, MD;  Location: [place];  Service: Neurosurgery;  Laterality: Left;  LEFT LUMBAR 3- LUMBAR 4 LUMBAR LAMINOTOMY AND MICRODISCECTOMY  .  PERMANENT PACEMAKER INSERTION N/A 11/22/2014   Procedure: PERMANENT PACEMAKER INSERTION;  Surgeon: [name], MD;  Location: [facility] C[facility] LAB;  Service: Cardiovascular;  Laterality: N/A;  . PROSTATE BIOPSY    . TONSILLECTOMY  1942    ROS- all systems are reviewed and negative except as per HPI above  Current Outpatient Medications  Medication Sig Dispense Refill  . atorvastatin (LIPITOR) 40 MG tablet TAKE ONE TABLET BY MOUTH DAILY 90 tablet 3  . Carboxymethylcellul-Glycerin (LUBRICATING EYE DROPS OP) Place 1 drop into both eyes daily as needed (dry eyes).    . carvedilol  (COREG) 12.5 MG tablet TAKE ONE TABLET BY MOUTH TWICE A DAY WITH MEALS 180 tablet 3  . cetirizine (ZYRTEC) 10 MG tablet Take 10 mg by mouth daily.     . clopidogrel (PLAVIX) 75 MG tablet TAKE ONE TABLET BY MOUTH DAILY 90 tablet 2  . ENTRESTO 24-26 MG TAKE ONE TABLET BY MOUTH TWICE A DAY 60 tablet 11  . nitroGLYCERIN (NITROSTAT) 0.4 MG SL tablet Place 1 tablet (0.4 mg total) under the tongue every 5 (five) minutes as needed for chest pain (MAX 3 TABLETS). 25 tablet 3   No current facility-administered medications for this visit.    Physical Exam: Vitals:   10/05/20 1540  BP: 126/84  Pulse: 73  SpO2: 98%  Weight: 150 lb 6.4 oz (68.2 kg)  Height: 5\' 9"  (1.753 m)    GEN- The patient is well appearing, alert and oriented x 3 today.   Head- normocephalic, atraumatic Eyes-  Sclera clear, conjunctiva pink Ears- hearing intact Oropharynx- clear Lungs-   normal work of breathing Chest- pacemaker pocket is well healed Heart- regular rate and rhythm,  GI- soft  Extremities- no clubbing, cyanosis, or edema  Pacemaker interrogation- reviewed in detail today,  See PACEART report  ekg tracing ordered today is personally reviewed and shows sinus with BiV pacing  Assessment and Plan:  1. Symptomatic complete heart block Normal biv pacemaker function See Pace Art report No changes today he is device dependant today  2. Nonischemic CM Doing well with CRT (NYHA Class I with narrow QRS) EF 40% Follows with Sharman Cheek  3. HTN Stable No change required today  4. afib No afib in at least 3 years Consider Simpson if AF returns  Risks, benefits and potential toxicities for medications prescribed and/or refilled reviewed with patient today.   Return to see EP PA annually  [name] MD, [facility] 10/05/2020 3:57 PM

## 2020-10-05 NOTE — Patient Instructions (Signed)
Medication Instructions:  Your physician recommends that you continue on your current medications as directed. Please refer to the Current Medication list given to you today.  Labwork: None ordered.  Testing/Procedures: None ordered.  Follow-Up: Your physician wants you to follow-up in: one year with    Michael "Andy" Tillery, PA-C   You will receive a reminder letter in the mail two months in advance. If you don't receive a letter, please call our office to schedule the follow-up appointment.   Any Other Special Instructions Will Be Listed Below (If Applicable).  If you need a refill on your cardiac medications before your next appointment, please call your pharmacy.         

## 2020-10-09 ENCOUNTER — Ambulatory Visit (INDEPENDENT_AMBULATORY_CARE_PROVIDER_SITE_OTHER): Payer: Medicare Other

## 2020-10-09 DIAGNOSIS — Z950 Presence of cardiac pacemaker: Secondary | ICD-10-CM

## 2020-10-09 DIAGNOSIS — I5022 Chronic systolic (congestive) heart failure: Secondary | ICD-10-CM

## 2020-10-10 DIAGNOSIS — H7293 Unspecified perforation of tympanic membrane, bilateral: Secondary | ICD-10-CM | POA: Diagnosis not present

## 2020-10-10 DIAGNOSIS — H906 Mixed conductive and sensorineural hearing loss, bilateral: Secondary | ICD-10-CM | POA: Diagnosis not present

## 2020-10-13 NOTE — Progress Notes (Signed)
EPIC Encounter for ICM Monitoring  Patient Name: Andrew Marshall is a 87 y.o. male Date: 10/13/2020 Primary Care Physican: Seward Carol, MD Primary Cardiologist:Skains Electrophysiologist:Allred Bi-V Pacing:>99% 2/10/2022OfficeWeight:150lbs    Transmission reviewed.   CorVue thoracic impedance suggesting fluid levels returned to normal.  Prescribed:No diuretic  Recommendations: No changes    Follow-up plan: ICM clinic phone appointment on3/02/2021. 91 day device clinic remote transmission4/01/2021.   EP/Cardiology Office Visits: 5/10/2022with Dr Marlou Porch.  Yearly with Dr [name] will be fue 09/2021 (no epic recall)  Copy of ICM check sent to Dr.Allred  3 month ICM trend: 10/09/2020.    1 Year ICM trend:       Rosalene Billings, RN 10/13/2020 10:07 AM

## 2020-10-19 DIAGNOSIS — R197 Diarrhea, unspecified: Secondary | ICD-10-CM | POA: Diagnosis not present

## 2020-10-19 DIAGNOSIS — D649 Anemia, unspecified: Secondary | ICD-10-CM | POA: Diagnosis not present

## 2020-10-19 DIAGNOSIS — A09 Infectious gastroenteritis and colitis, unspecified: Secondary | ICD-10-CM | POA: Diagnosis not present

## 2020-10-23 DIAGNOSIS — R197 Diarrhea, unspecified: Secondary | ICD-10-CM | POA: Diagnosis not present

## 2020-10-23 DIAGNOSIS — A09 Infectious gastroenteritis and colitis, unspecified: Secondary | ICD-10-CM | POA: Diagnosis not present

## 2020-10-26 ENCOUNTER — Encounter (HOSPITAL_COMMUNITY): Payer: Self-pay | Admitting: Emergency Medicine

## 2020-10-26 ENCOUNTER — Emergency Department (HOSPITAL_COMMUNITY): Payer: Medicare Other

## 2020-10-26 ENCOUNTER — Emergency Department (HOSPITAL_COMMUNITY)
Admission: EM | Admit: 2020-10-26 | Discharge: 2020-10-27 | Disposition: A | Discharge status: Home / Self Care | Payer: Medicare Other | Attending: Emergency Medicine | Admitting: Emergency Medicine

## 2020-10-26 ENCOUNTER — Other Ambulatory Visit: Payer: Self-pay

## 2020-10-26 DIAGNOSIS — M545 Low back pain, unspecified: Secondary | ICD-10-CM | POA: Insufficient documentation

## 2020-10-26 DIAGNOSIS — Y92410 Unspecified street and highway as the place of occurrence of the external cause: Secondary | ICD-10-CM | POA: Insufficient documentation

## 2020-10-26 DIAGNOSIS — Z5321 Procedure and treatment not carried out due to patient leaving prior to being seen by health care provider: Secondary | ICD-10-CM | POA: Insufficient documentation

## 2020-10-26 DIAGNOSIS — Z041 Encounter for examination and observation following transport accident: Secondary | ICD-10-CM | POA: Diagnosis not present

## 2020-10-26 NOTE — ED Notes (Signed)
Pt did not respond when called for final vitals check 

## 2020-10-26 NOTE — ED Notes (Signed)
Pt did npt respond when called for vitals check X2
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
## 2020-10-27 DIAGNOSIS — R197 Diarrhea, unspecified: Secondary | ICD-10-CM | POA: Diagnosis not present

## 2020-10-30 ENCOUNTER — Ambulatory Visit (INDEPENDENT_AMBULATORY_CARE_PROVIDER_SITE_OTHER): Payer: Medicare Other

## 2020-10-30 DIAGNOSIS — I5022 Chronic systolic (congestive) heart failure: Secondary | ICD-10-CM

## 2020-10-30 DIAGNOSIS — Z950 Presence of cardiac pacemaker: Secondary | ICD-10-CM | POA: Diagnosis not present

## 2020-10-31 NOTE — Progress Notes (Signed)
EPIC Encounter for ICM Monitoring  Patient Name: Andrew Marshall is a 87 y.o. male Date: 10/31/2020 Primary Care Physican: Seward Carol, MD Primary Cardiologist:Skains Electrophysiologist:Allred Bi-V Pacing:>99% 2/10/2022OfficeWeight:150lbs    Spoke with patient and reports feeling well at this time.  Denies fluid symptoms.    CorVue thoracic impedancesuggesting fluid levels returned to normal.  Prescribed:No diuretic  Recommendations: No changes and encouraged to call if experiencing any fluid symptoms.  Follow-up plan: ICM clinic phone appointment on4/06/2021. 91 day device clinic remote transmission4/01/2021.   EP/Cardiology Office Visits: 5/10/2022with Dr Marlou Porch.  Yearly with Dr [name] will be fue 09/2021 (no epic recall)  Copy of ICM check sent to Dr.Allred  3 month ICM trend: 10/30/2020.    1 Year ICM trend:       Rosalene Billings, RN 10/31/2020 11:52 AM

## 2020-11-01 DIAGNOSIS — M545 Low back pain, unspecified: Secondary | ICD-10-CM | POA: Diagnosis not present

## 2020-11-06 ENCOUNTER — Emergency Department (HOSPITAL_COMMUNITY): Payer: Medicare Other

## 2020-11-06 ENCOUNTER — Emergency Department (HOSPITAL_COMMUNITY)
Admission: EM | Admit: 2020-11-06 | Discharge: 2020-11-07 | Disposition: A | Discharge status: Home / Self Care | Payer: Medicare Other | Attending: Emergency Medicine | Admitting: Emergency Medicine

## 2020-11-06 ENCOUNTER — Other Ambulatory Visit: Payer: Self-pay

## 2020-11-06 ENCOUNTER — Encounter (HOSPITAL_COMMUNITY): Payer: Self-pay | Admitting: Emergency Medicine

## 2020-11-06 DIAGNOSIS — S61032A Puncture wound without foreign body of left thumb without damage to nail, initial encounter: Secondary | ICD-10-CM

## 2020-11-06 DIAGNOSIS — I10 Essential (primary) hypertension: Secondary | ICD-10-CM | POA: Diagnosis not present

## 2020-11-06 DIAGNOSIS — Z23 Encounter for immunization: Secondary | ICD-10-CM | POA: Diagnosis not present

## 2020-11-06 DIAGNOSIS — Z950 Presence of cardiac pacemaker: Secondary | ICD-10-CM | POA: Diagnosis not present

## 2020-11-06 DIAGNOSIS — Z8521 Personal history of malignant neoplasm of larynx: Secondary | ICD-10-CM | POA: Diagnosis not present

## 2020-11-06 DIAGNOSIS — M795 Residual foreign body in soft tissue: Secondary | ICD-10-CM | POA: Diagnosis not present

## 2020-11-06 DIAGNOSIS — S61002A Unspecified open wound of left thumb without damage to nail, initial encounter: Secondary | ICD-10-CM | POA: Diagnosis not present

## 2020-11-06 DIAGNOSIS — S6992XA Unspecified injury of left wrist, hand and finger(s), initial encounter: Secondary | ICD-10-CM | POA: Diagnosis present

## 2020-11-06 DIAGNOSIS — Z79899 Other long term (current) drug therapy: Secondary | ICD-10-CM | POA: Insufficient documentation

## 2020-11-06 DIAGNOSIS — S61401A Unspecified open wound of right hand, initial encounter: Secondary | ICD-10-CM | POA: Insufficient documentation

## 2020-11-06 DIAGNOSIS — Z87891 Personal history of nicotine dependence: Secondary | ICD-10-CM | POA: Insufficient documentation

## 2020-11-06 DIAGNOSIS — W3400XA Accidental discharge from unspecified firearms or gun, initial encounter: Secondary | ICD-10-CM | POA: Insufficient documentation

## 2020-11-06 DIAGNOSIS — Z8546 Personal history of malignant neoplasm of prostate: Secondary | ICD-10-CM | POA: Diagnosis not present

## 2020-11-06 DIAGNOSIS — I2510 Atherosclerotic heart disease of native coronary artery without angina pectoris: Secondary | ICD-10-CM | POA: Insufficient documentation

## 2020-11-06 DIAGNOSIS — Z85828 Personal history of other malignant neoplasm of skin: Secondary | ICD-10-CM | POA: Diagnosis not present
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
## 2020-11-07 MED ORDER — CEPHALEXIN 500 MG PO CAPS: 500.0000 mg | ORAL_CAPSULE | Freq: Two times a day (BID) | ORAL | 0 refills | Status: DC

## 2020-11-07 MED ORDER — TETANUS-DIPHTH-ACELL PERTUSSIS 5-2.5-18.5 LF-MCG/0.5 IM SUSY
0.5000 mL | PREFILLED_SYRINGE | Freq: Once | INTRAMUSCULAR | Status: AC
  Administered 2020-11-07: 0.5 mL via INTRAMUSCULAR
  Filled 2020-11-07: qty 0.5

## 2020-11-07 MED ORDER — LIDOCAINE HCL (PF) 1 % IJ SOLN
30.0000 mL | Freq: Once | INTRAMUSCULAR | Status: AC
  Administered 2020-11-07: 30 mL
  Filled 2020-11-07: qty 30
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
## 2020-11-07 NOTE — Progress Notes (Signed)
Orthopedic Tech Progress Note Patient Details:  Andrew Marshall 20-Mar-1931 [id number]  Ortho Devices Type of Ortho Device: Finger splint Ortho Device/Splint Location: lue thumb Ortho Device/Splint Interventions: Ordered,Application,Adjustment   Post Interventions Patient Tolerated: Well Instructions Provided: Care of device,Adjustment of device   Karolee Stamps 11/07/2020, 2:54 AM

## 2020-11-07 NOTE — ED Notes (Signed)
Wound cleansed with sterile water and betadine at this time. Patient tolerated well. Wet dressing left in place.

## 2020-11-07 NOTE — ED Notes (Signed)
Ortho tech called at this time.l

## 2020-11-07 NOTE — ED Notes (Signed)
Patient given discharge paperwork and instructions, verbalized understanding of teaching. Ambulatory to exit in NAD with steady gait. No IV access.

## 2020-11-07 NOTE — ED Notes (Signed)
Wound dressed at this time. Patient tolerated well. Bleeding controlled with bulky dressing. Xeroform applied directly to wound, dry bulky dressing applied over.

## 2020-11-08 DIAGNOSIS — T148XXA Other injury of unspecified body region, initial encounter: Secondary | ICD-10-CM | POA: Diagnosis not present

## 2020-11-08 DIAGNOSIS — M65849 Other synovitis and tenosynovitis, unspecified hand: Secondary | ICD-10-CM | POA: Diagnosis not present

## 2020-11-08 DIAGNOSIS — M79645 Pain in left finger(s): Secondary | ICD-10-CM | POA: Diagnosis not present

## 2020-11-20 DIAGNOSIS — M79645 Pain in left finger(s): Secondary | ICD-10-CM | POA: Diagnosis not present

## 2020-11-20 DIAGNOSIS — T148XXD Other injury of unspecified body region, subsequent encounter: Secondary | ICD-10-CM | POA: Diagnosis not present

## 2020-11-20 DIAGNOSIS — M65842 Other synovitis and tenosynovitis, left hand: Secondary | ICD-10-CM | POA: Diagnosis not present

## 2020-11-29 ENCOUNTER — Ambulatory Visit (INDEPENDENT_AMBULATORY_CARE_PROVIDER_SITE_OTHER): Payer: Medicare Other

## 2020-11-29 DIAGNOSIS — I428 Other cardiomyopathies: Secondary | ICD-10-CM

## 2020-11-30 LAB — CUP PACEART REMOTE DEVICE CHECK
Battery Remaining Longevity: 84 mo
Battery Remaining Percentage: 95.5 %
Battery Voltage: 2.99 V
Brady Statistic AP VP Percent: 95 %
Brady Statistic AP VS Percent: 1 %
Brady Statistic AS VP Percent: 4.4 %
Brady Statistic AS VS Percent: 1 %
Brady Statistic RA Percent Paced: 95 %
Date Time Interrogation Session: 20220406020008
Implantable Lead Implant Date: 20160329
Implantable Lead Implant Date: 20160329
Implantable Lead Implant Date: 20210406
Implantable Lead Location: 753858
Implantable Lead Location: 753859
Implantable Lead Location: 753860
Implantable Lead Model: 1948
Implantable Pulse Generator Implant Date: 20210406
Lead Channel Impedance Value: 430 Ohm
Lead Channel Impedance Value: 660 Ohm
Lead Channel Impedance Value: 760 Ohm
Lead Channel Pacing Threshold Amplitude: 0.5 V
Lead Channel Pacing Threshold Amplitude: 0.75 V
Lead Channel Pacing Threshold Amplitude: 2.25 V
Lead Channel Pacing Threshold Pulse Width: 0.5 ms
Lead Channel Pacing Threshold Pulse Width: 0.5 ms
Lead Channel Pacing Threshold Pulse Width: 0.5 ms
Lead Channel Sensing Intrinsic Amplitude: 12 mV
Lead Channel Sensing Intrinsic Amplitude: 3.2 mV
Lead Channel Setting Pacing Amplitude: 1.5 V
Lead Channel Setting Pacing Amplitude: 2 V
Lead Channel Setting Pacing Amplitude: 2.75 V
Lead Channel Setting Pacing Pulse Width: 0.5 ms
Lead Channel Setting Pacing Pulse Width: 0.5 ms
Lead Channel Setting Sensing Sensitivity: 4 mV
Pulse Gen Model: 3562
Pulse Gen Serial Number: 6058048

## 2020-12-04 ENCOUNTER — Ambulatory Visit (INDEPENDENT_AMBULATORY_CARE_PROVIDER_SITE_OTHER): Payer: Medicare Other

## 2020-12-04 DIAGNOSIS — I5022 Chronic systolic (congestive) heart failure: Secondary | ICD-10-CM

## 2020-12-04 DIAGNOSIS — Z950 Presence of cardiac pacemaker: Secondary | ICD-10-CM | POA: Diagnosis not present

## 2020-12-05 NOTE — Progress Notes (Signed)
EPIC Encounter for ICM Monitoring  Patient Name: Andrew Marshall is a 87 y.o. male Date: 12/05/2020 Primary Care Physican: Seward Carol, MD Primary Cardiologist:Skains Electrophysiologist:Allred Bi-V Pacing:>99% 2/10/2022OfficeWeight:150lbs    Spoke with patient and reports feeling well at this time.  Denies fluid symptoms.    CorVue thoracic impedancesuggesting normal fluid levels.  Prescribed:No diuretic  Recommendations: No changes and encouraged to call if experiencing any fluid symptoms.  Follow-up plan: ICM clinic phone appointment on6/08/2020. 91 day device clinic remote transmission 02/28/2021.   EP/Cardiology Office Visits: 5/10/2022with Dr Marlou Porch.  Yearly with Dr [name] will be 09/2021 (no epic recall)  Copy of ICM check sent to Dr.Allred  3 month ICM trend: 12/04/2020.    1 Year ICM trend:       Rosalene Billings, RN 12/05/2020 11:18 AM

## 2020-12-11 NOTE — Progress Notes (Signed)
Remote pacemaker transmission.   

## 2020-12-21 DIAGNOSIS — M79645 Pain in left finger(s): Secondary | ICD-10-CM | POA: Diagnosis not present

## 2020-12-21 DIAGNOSIS — M1811 Unilateral primary osteoarthritis of first carpometacarpal joint, right hand: Secondary | ICD-10-CM | POA: Diagnosis not present

## 2020-12-29 DIAGNOSIS — I5020 Unspecified systolic (congestive) heart failure: Secondary | ICD-10-CM | POA: Diagnosis not present

## 2020-12-29 DIAGNOSIS — E7800 Pure hypercholesterolemia, unspecified: Secondary | ICD-10-CM | POA: Diagnosis not present

## 2020-12-29 DIAGNOSIS — I10 Essential (primary) hypertension: Secondary | ICD-10-CM | POA: Diagnosis not present

## 2020-12-29 DIAGNOSIS — K4090 Unilateral inguinal hernia, without obstruction or gangrene, not specified as recurrent: Secondary | ICD-10-CM | POA: Diagnosis not present

## 2020-12-29 DIAGNOSIS — G4700 Insomnia, unspecified: Secondary | ICD-10-CM | POA: Diagnosis not present

## 2020-12-29 DIAGNOSIS — I25119 Atherosclerotic heart disease of native coronary artery with unspecified angina pectoris: Secondary | ICD-10-CM | POA: Diagnosis not present

## 2021-01-02 ENCOUNTER — Ambulatory Visit: Payer: Medicare Other | Admitting: Cardiology

## 2021-01-02 ENCOUNTER — Encounter: Payer: Self-pay | Admitting: Cardiology

## 2021-01-02 ENCOUNTER — Other Ambulatory Visit: Payer: Self-pay

## 2021-01-02 VITALS — BP 110/50 | HR 86 | Ht 69.0 in | Wt 155.0 lb

## 2021-01-02 DIAGNOSIS — Z950 Presence of cardiac pacemaker: Secondary | ICD-10-CM

## 2021-01-02 DIAGNOSIS — I5022 Chronic systolic (congestive) heart failure: Secondary | ICD-10-CM | POA: Diagnosis not present

## 2021-01-02 DIAGNOSIS — I442 Atrioventricular block, complete: Secondary | ICD-10-CM | POA: Diagnosis not present

## 2021-01-02 DIAGNOSIS — I428 Other cardiomyopathies: Secondary | ICD-10-CM | POA: Diagnosis not present
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
## 2021-01-02 NOTE — Patient Instructions (Signed)

## 2021-01-08 DIAGNOSIS — G4700 Insomnia, unspecified: Secondary | ICD-10-CM | POA: Diagnosis not present

## 2021-01-08 DIAGNOSIS — I10 Essential (primary) hypertension: Secondary | ICD-10-CM | POA: Diagnosis not present

## 2021-01-08 DIAGNOSIS — I25119 Atherosclerotic heart disease of native coronary artery with unspecified angina pectoris: Secondary | ICD-10-CM | POA: Diagnosis not present

## 2021-01-08 DIAGNOSIS — M189 Osteoarthritis of first carpometacarpal joint, unspecified: Secondary | ICD-10-CM | POA: Diagnosis not present

## 2021-01-08 DIAGNOSIS — I5020 Unspecified systolic (congestive) heart failure: Secondary | ICD-10-CM | POA: Diagnosis not present

## 2021-01-08 DIAGNOSIS — E7800 Pure hypercholesterolemia, unspecified: Secondary | ICD-10-CM | POA: Diagnosis not present

## 2021-01-08 DIAGNOSIS — I2510 Atherosclerotic heart disease of native coronary artery without angina pectoris: Secondary | ICD-10-CM | POA: Diagnosis not present

## 2021-01-24 ENCOUNTER — Ambulatory Visit (INDEPENDENT_AMBULATORY_CARE_PROVIDER_SITE_OTHER): Payer: Medicare Other

## 2021-01-24 DIAGNOSIS — I5022 Chronic systolic (congestive) heart failure: Secondary | ICD-10-CM | POA: Diagnosis not present

## 2021-01-24 DIAGNOSIS — Z950 Presence of cardiac pacemaker: Secondary | ICD-10-CM | POA: Diagnosis not present

## 2021-01-26 DIAGNOSIS — K4090 Unilateral inguinal hernia, without obstruction or gangrene, not specified as recurrent: Secondary | ICD-10-CM | POA: Diagnosis not present

## 2021-01-26 NOTE — Progress Notes (Signed)
EPIC Encounter for ICM Monitoring  Patient Name: Andrew Marshall is a 87 y.o. male Date: 01/26/2021 Primary Care Physican: Lujean Amel, MD Primary Cardiologist:Skains Electrophysiologist:Allred Bi-V Pacing:>99% 6/3/2022Weight:142-145lbs    Spoke with patient and reports feeling well at this time.  Denies fluid symptoms.    CorVue thoracic impedancesuggestingnormal fluid levels.   Prescribed:No diuretic  Recommendations: No changes and encouraged to call if experiencing any fluid symptoms.  Follow-up plan: ICM clinic phone appointment on7/12/2020. 91 day device clinic remote transmission 02/28/2021.   EP/Cardiology Office Visits: . Yearly with Dr [name] will be 09/2021 (no epic recall)  Copy of ICM check sent to Dr.Allred   3 month ICM trend: 01/24/2021.    1 Year ICM trend:       Rosalene Billings, RN 01/26/2021 12:59 PM

## 2021-02-21 DIAGNOSIS — M79605 Pain in left leg: Secondary | ICD-10-CM | POA: Diagnosis not present

## 2021-02-21 DIAGNOSIS — M25552 Pain in left hip: Secondary | ICD-10-CM | POA: Diagnosis not present

## 2021-02-27 ENCOUNTER — Ambulatory Visit (INDEPENDENT_AMBULATORY_CARE_PROVIDER_SITE_OTHER): Payer: Medicare Other

## 2021-02-27 DIAGNOSIS — I5022 Chronic systolic (congestive) heart failure: Secondary | ICD-10-CM

## 2021-02-27 DIAGNOSIS — Z950 Presence of cardiac pacemaker: Secondary | ICD-10-CM

## 2021-02-27 NOTE — Progress Notes (Signed)
EPIC Encounter for ICM Monitoring  Patient Name: Andrew Marshall is a 87 y.o. male Date: 02/27/2021 Primary Care Physican: Lujean Amel, MD Primary Cardiologist: Marlou Porch Electrophysiologist: Allred Bi-V Pacing: 96%            01/26/2021 Weight: 142-145 lbs                                                            Spoke with patient and heart failure questions reviewed.  Pt asymptomatic for fluid accumulation and feeing well.   CorVue thoracic impedance suggesting normal fluid levels.   Prescribed: No diuretic   Recommendations:  No changes and encouraged to call if experiencing any fluid symptoms.   Follow-up plan: ICM clinic phone appointment on 04/02/2021.  91 day device clinic remote transmission 05/30/2021.     EP/Cardiology Office Visits:  Recall 07/01/2021 with Dr Marlou Porch.  Recall 10/13/2021 with Oda Kilts, PA.   Copy of ICM check sent to Dr. [name].   3 month ICM trend: 02/27/2021.    1 Year ICM trend:       Rosalene Billings, RN 02/27/2021 4:53 PM

## 2021-02-28 ENCOUNTER — Ambulatory Visit (INDEPENDENT_AMBULATORY_CARE_PROVIDER_SITE_OTHER): Payer: Medicare Other

## 2021-02-28 DIAGNOSIS — I428 Other cardiomyopathies: Secondary | ICD-10-CM | POA: Diagnosis not present

## 2021-03-01 LAB — CUP PACEART REMOTE DEVICE CHECK
Battery Remaining Longevity: 72 mo
Battery Remaining Percentage: 79 %
Battery Voltage: 2.99 V
Brady Statistic AP VP Percent: 94 %
Brady Statistic AP VS Percent: 1 %
Brady Statistic AS VP Percent: 2.3 %
Brady Statistic AS VS Percent: 1 %
Brady Statistic RA Percent Paced: 91 %
Date Time Interrogation Session: 20220705020021
Implantable Lead Implant Date: 20160329
Implantable Lead Implant Date: 20160329
Implantable Lead Implant Date: 20210406
Implantable Lead Location: 753858
Implantable Lead Location: 753859
Implantable Lead Location: 753860
Implantable Lead Model: 1948
Implantable Pulse Generator Implant Date: 20210406
Lead Channel Impedance Value: 400 Ohm
Lead Channel Impedance Value: 600 Ohm
Lead Channel Impedance Value: 650 Ohm
Lead Channel Pacing Threshold Amplitude: 0.625 V
Lead Channel Pacing Threshold Amplitude: 0.625 V
Lead Channel Pacing Threshold Amplitude: 1.5 V
Lead Channel Pacing Threshold Pulse Width: 0.5 ms
Lead Channel Pacing Threshold Pulse Width: 0.5 ms
Lead Channel Pacing Threshold Pulse Width: 0.5 ms
Lead Channel Sensing Intrinsic Amplitude: 12 mV
Lead Channel Sensing Intrinsic Amplitude: 2.6 mV
Lead Channel Setting Pacing Amplitude: 1.625
Lead Channel Setting Pacing Amplitude: 2 V
Lead Channel Setting Pacing Amplitude: 2 V
Lead Channel Setting Pacing Pulse Width: 0.5 ms
Lead Channel Setting Pacing Pulse Width: 0.5 ms
Lead Channel Setting Sensing Sensitivity: 4 mV
Pulse Gen Model: 3562
Pulse Gen Serial Number: 6058048

## 2021-03-12 DIAGNOSIS — S50312A Abrasion of left elbow, initial encounter: Secondary | ICD-10-CM | POA: Diagnosis not present

## 2021-03-12 DIAGNOSIS — L03114 Cellulitis of left upper limb: Secondary | ICD-10-CM | POA: Diagnosis not present

## 2021-03-21 NOTE — Progress Notes (Signed)
Remote pacemaker transmission.   

## 2021-04-02 ENCOUNTER — Ambulatory Visit (INDEPENDENT_AMBULATORY_CARE_PROVIDER_SITE_OTHER): Payer: Medicare Other

## 2021-04-02 DIAGNOSIS — Z950 Presence of cardiac pacemaker: Secondary | ICD-10-CM

## 2021-04-02 DIAGNOSIS — I5022 Chronic systolic (congestive) heart failure: Secondary | ICD-10-CM | POA: Diagnosis not present

## 2021-04-04 ENCOUNTER — Telehealth: Payer: Self-pay

## 2021-04-04 NOTE — Telephone Encounter (Signed)
Remote ICM transmission received.  Attempted call to patient regarding ICM remote transmission and no answer or voice mail option.  

## 2021-04-04 NOTE — Progress Notes (Signed)
EPIC Encounter for ICM Monitoring  Patient Name: Andrew Marshall is a 87 y.o. male Date: 04/04/2021 Primary Care Physican: Lujean Amel, MD Primary Cardiologist: Marlou Porch Electrophysiologist: Allred Bi-V Pacing: 96%            01/26/2021 Weight: 142-145 lbs                                                            Attempted call to patient and unable to reach.   Transmission reviewed.    CorVue thoracic impedance suggesting normal fluid levels.   Prescribed: No diuretic   Recommendations:  Unable to reach.     Follow-up plan: ICM clinic phone appointment on 05/07/2021.  91 day device clinic remote transmission 05/30/2021.     EP/Cardiology Office Visits:  07/01/2021 with Dr Marlou Porch.  Recall 10/13/2021 with Oda Kilts, PA.   Copy of ICM check sent to Dr. [name].   3 month ICM trend: 04/02/2021.    1 Year ICM trend:       Rosalene Billings, RN 04/04/2021 1:36 PM

## 2021-04-09 DIAGNOSIS — I10 Essential (primary) hypertension: Secondary | ICD-10-CM | POA: Diagnosis not present

## 2021-04-09 DIAGNOSIS — I25119 Atherosclerotic heart disease of native coronary artery with unspecified angina pectoris: Secondary | ICD-10-CM | POA: Diagnosis not present

## 2021-04-09 DIAGNOSIS — E7800 Pure hypercholesterolemia, unspecified: Secondary | ICD-10-CM | POA: Diagnosis not present

## 2021-04-09 DIAGNOSIS — M189 Osteoarthritis of first carpometacarpal joint, unspecified: Secondary | ICD-10-CM | POA: Diagnosis not present

## 2021-04-09 DIAGNOSIS — G4700 Insomnia, unspecified: Secondary | ICD-10-CM | POA: Diagnosis not present

## 2021-04-09 DIAGNOSIS — I5020 Unspecified systolic (congestive) heart failure: Secondary | ICD-10-CM | POA: Diagnosis not present

## 2021-04-09 DIAGNOSIS — I2510 Atherosclerotic heart disease of native coronary artery without angina pectoris: Secondary | ICD-10-CM | POA: Diagnosis not present

## 2021-04-13 DIAGNOSIS — H9211 Otorrhea, right ear: Secondary | ICD-10-CM | POA: Diagnosis not present

## 2021-04-13 DIAGNOSIS — H7291 Unspecified perforation of tympanic membrane, right ear: Secondary | ICD-10-CM | POA: Diagnosis not present

## 2021-05-07 ENCOUNTER — Ambulatory Visit (INDEPENDENT_AMBULATORY_CARE_PROVIDER_SITE_OTHER): Payer: Medicare Other

## 2021-05-07 ENCOUNTER — Telehealth: Payer: Self-pay

## 2021-05-07 DIAGNOSIS — I5022 Chronic systolic (congestive) heart failure: Secondary | ICD-10-CM | POA: Diagnosis not present

## 2021-05-07 DIAGNOSIS — Z950 Presence of cardiac pacemaker: Secondary | ICD-10-CM | POA: Diagnosis not present

## 2021-05-07 NOTE — Telephone Encounter (Signed)
Remote ICM transmission received.  Attempted call to patient regarding ICM remote transmission and left detailed message per DPR.  Advised to return call for any fluid symptoms or questions. Next ICM remote transmission scheduled 06/11/2021.    

## 2021-05-07 NOTE — Progress Notes (Signed)
EPIC Encounter for ICM Monitoring  Patient Name: Andrew Marshall is a 87 y.o. male Date: 05/07/2021 Primary Care Physican: Lujean Amel, MD Primary Cardiologist: Marlou Porch Electrophysiologist: Allred Bi-V Pacing: 96%            01/26/2021 Weight: 142-145 lbs                                                            Attempted call to patient and unable to reach.   Transmission reviewed.    CorVue thoracic impedance trending close to baseline.   Prescribed: No diuretic   Recommendations:  Unable to reach.     Follow-up plan: ICM clinic phone appointment on 06/11/2021.  91 day device clinic remote transmission 05/30/2021.     EP/Cardiology Office Visits:  07/01/2021 with Dr Marlou Porch.  Recall 10/13/2021 with Oda Kilts, PA.   Copy of ICM check sent to Dr. [name].    3 month ICM trend: 05/07/2021.    1 Year ICM trend:       Rosalene Billings, RN 05/07/2021 10:40 AM

## 2021-05-07 NOTE — Progress Notes (Signed)
Spoke with patient and heart failure questions reviewed.  Pt asymptomatic for fluid accumulation and feeling well.   He does not use salt at the table and his wife is good at reviewing food labels for salt content.  Discussed limiting fluid intake to 64 oz daily.  Encouraged to call if experiencing any fluid symptoms.

## 2021-05-21 DIAGNOSIS — R350 Frequency of micturition: Secondary | ICD-10-CM | POA: Diagnosis not present

## 2021-05-30 ENCOUNTER — Ambulatory Visit (INDEPENDENT_AMBULATORY_CARE_PROVIDER_SITE_OTHER): Payer: Medicare Other

## 2021-05-30 DIAGNOSIS — I428 Other cardiomyopathies: Secondary | ICD-10-CM | POA: Diagnosis not present

## 2021-05-30 LAB — CUP PACEART REMOTE DEVICE CHECK
Battery Remaining Longevity: 71 mo
Battery Remaining Percentage: 76 %
Battery Voltage: 2.99 V
Brady Statistic AP VP Percent: 93 %
Brady Statistic AP VS Percent: 1 %
Brady Statistic AS VP Percent: 1.8 %
Brady Statistic AS VS Percent: 1 %
Brady Statistic RA Percent Paced: 90 %
Date Time Interrogation Session: 20221005031432
Implantable Lead Implant Date: 20160329
Implantable Lead Implant Date: 20160329
Implantable Lead Implant Date: 20210406
Implantable Lead Location: 753858
Implantable Lead Location: 753859
Implantable Lead Location: 753860
Implantable Lead Model: 1948
Implantable Pulse Generator Implant Date: 20210406
Lead Channel Impedance Value: 400 Ohm
Lead Channel Impedance Value: 650 Ohm
Lead Channel Impedance Value: 690 Ohm
Lead Channel Pacing Threshold Amplitude: 0.625 V
Lead Channel Pacing Threshold Amplitude: 0.625 V
Lead Channel Pacing Threshold Amplitude: 1.75 V
Lead Channel Pacing Threshold Pulse Width: 0.5 ms
Lead Channel Pacing Threshold Pulse Width: 0.5 ms
Lead Channel Pacing Threshold Pulse Width: 0.5 ms
Lead Channel Sensing Intrinsic Amplitude: 11.9 mV
Lead Channel Sensing Intrinsic Amplitude: 3.7 mV
Lead Channel Setting Pacing Amplitude: 1.625
Lead Channel Setting Pacing Amplitude: 2 V
Lead Channel Setting Pacing Amplitude: 2.25 V
Lead Channel Setting Pacing Pulse Width: 0.5 ms
Lead Channel Setting Pacing Pulse Width: 0.5 ms
Lead Channel Setting Sensing Sensitivity: 4 mV
Pulse Gen Model: 3562
Pulse Gen Serial Number: 6058048

## 2021-06-06 NOTE — Progress Notes (Signed)
Remote pacemaker transmission.   

## 2021-06-11 ENCOUNTER — Ambulatory Visit (INDEPENDENT_AMBULATORY_CARE_PROVIDER_SITE_OTHER): Payer: Medicare Other

## 2021-06-11 DIAGNOSIS — I5022 Chronic systolic (congestive) heart failure: Secondary | ICD-10-CM | POA: Diagnosis not present

## 2021-06-11 DIAGNOSIS — Z950 Presence of cardiac pacemaker: Secondary | ICD-10-CM | POA: Diagnosis not present

## 2021-06-13 ENCOUNTER — Other Ambulatory Visit: Payer: Self-pay | Admitting: *Deleted

## 2021-06-13 MED ORDER — ENTRESTO 24-26 MG PO TABS: 1.0000 | ORAL_TABLET | Freq: Two times a day (BID) | ORAL | 11 refills | Status: DC

## 2021-06-13 NOTE — Progress Notes (Signed)
EPIC Encounter for ICM Monitoring  Patient Name: Andrew Marshall is a 87 y.o. male Date: 06/13/2021 Primary Care Physican: Lujean Amel, MD Primary Cardiologist: Marlou Porch Electrophysiologist: Allred Bi-V Pacing: 95%            06/13/2021 Weight: 145 lbs                                                            Spoke with patient and heart failure questions reviewed.  Pt asymptomatic for fluid accumulation and feeling well.   CorVue thoracic impedance suggesting normal fluid levels.   Prescribed: No diuretic   Recommendations:  No changes and encouraged to call if experiencing any fluid symptoms.   Follow-up plan: ICM clinic phone appointment on 07/23/2021.  91 day device clinic remote transmission 08/29/2021.     EP/Cardiology Office Visits:  07/11/2021 with Dr Marlou Porch.  Recall 10/13/2021 with Oda Kilts, PA.   Copy of ICM check sent to Dr. [name].   3 month ICM trend: 06/11/2021.    1 Year ICM trend:       Rosalene Billings, RN 06/13/2021 10:24 AM

## 2021-06-24 ENCOUNTER — Other Ambulatory Visit: Payer: Self-pay | Admitting: Internal Medicine

## 2021-06-24 DIAGNOSIS — H01002 Unspecified blepharitis right lower eyelid: Secondary | ICD-10-CM | POA: Diagnosis not present

## 2021-06-25 ENCOUNTER — Other Ambulatory Visit: Payer: Self-pay | Admitting: *Deleted

## 2021-06-25 MED ORDER — ATORVASTATIN CALCIUM 40 MG PO TABS: 40.0000 mg | ORAL_TABLET | Freq: Every day | ORAL | 3 refills | Status: DC

## 2021-06-28 DIAGNOSIS — H00012 Hordeolum externum right lower eyelid: Secondary | ICD-10-CM | POA: Diagnosis not present
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
## 2021-07-11 ENCOUNTER — Encounter: Payer: Self-pay | Admitting: Cardiology

## 2021-07-11 ENCOUNTER — Other Ambulatory Visit: Payer: Self-pay

## 2021-07-11 ENCOUNTER — Ambulatory Visit: Payer: Medicare Other | Admitting: Cardiology

## 2021-07-11 DIAGNOSIS — I5022 Chronic systolic (congestive) heart failure: Secondary | ICD-10-CM | POA: Diagnosis not present

## 2021-07-11 DIAGNOSIS — I252 Old myocardial infarction: Secondary | ICD-10-CM | POA: Diagnosis not present

## 2021-07-11 DIAGNOSIS — E7800 Pure hypercholesterolemia, unspecified: Secondary | ICD-10-CM | POA: Diagnosis not present

## 2021-07-11 DIAGNOSIS — I2510 Atherosclerotic heart disease of native coronary artery without angina pectoris: Secondary | ICD-10-CM | POA: Diagnosis not present

## 2021-07-11 DIAGNOSIS — I340 Nonrheumatic mitral (valve) insufficiency: Secondary | ICD-10-CM | POA: Diagnosis not present

## 2021-07-11 DIAGNOSIS — Z950 Presence of cardiac pacemaker: Secondary | ICD-10-CM | POA: Diagnosis not present

## 2021-07-11 NOTE — Assessment & Plan Note (Signed)
Stable, previously upgraded to biventricular.  Dr. [name].  Excellent.  Managed by EP.

## 2021-07-11 NOTE — Assessment & Plan Note (Signed)
Moderate mitral regurgitation.  1/6 systolic murmur heard on exam.  Continue to monitor clinically.  No significant shortness of breath.  Excellent.

## 2021-07-11 NOTE — Assessment & Plan Note (Signed)
Has had biventricular upgrade of ICD, Dr. [name].  Also on low-dose Entresto.  Blood pressure 100/60 today in clinic at maximum 132 at home.  Doing well.  No syncope.  Continue with carvedilol 12.5 mg twice a day as well.  Given his current blood pressure I do not think that he would tolerate spironolactone or SGLT2 inhibitor at this time.

## 2021-07-11 NOTE — Assessment & Plan Note (Signed)
Prior lateral wall MI 2001 Prior stents as follows:3.5 x 12 mm stent to proximal circumflex. Also had in-stent restenosis and had a 3.0 x 24 mm Cypher stent to second obtuse marginal covering the prior stent. In 2016 had another proximal circumflex stent placed Synergy 2.5 x 12 mm.  Doing well without any anginal symptoms.

## 2021-07-11 NOTE — Assessment & Plan Note (Signed)
Overall doing well without any anginal symptoms.  On goal-directed medical therapy which includes high intensity statin, Plavix 75 mg a day.  Carvedilol 12.5 mg twice a day.  Also on Entresto for cardiomyopathy.

## 2021-07-11 NOTE — Patient Instructions (Signed)
Medication Instructions:   Your physician recommends that you continue on your current medications as directed. Please refer to the Current Medication list given to you today.  *If you need a refill on your cardiac medications before your next appointment, please call your pharmacy*    Follow-Up: At [facility], you and your health needs are our priority.  As part of our continuing mission to provide you with exceptional heart care, we have created designated Provider Care Teams.  These Care Teams include your primary Cardiologist (physician) and Advanced Practice Providers (APPs -  Physician Assistants and Nurse Practitioners) who all work together to provide you with the care you need, when you need it.  We recommend signing up for the patient portal called "MyChart".  Sign up information is provided on this After Visit Summary.  MyChart is used to connect with patients for Virtual Visits (Telemedicine).  Patients are able to view lab/test results, encounter notes, upcoming appointments, etc.  Non-urgent messages can be sent to your provider as well.   To learn more about what you can do with MyChart, go to [email or web address].    Your next appointment:   6 month(s)  The format for your next appointment:   In Person  Provider:   Robbie Lis, PA-C, Melina Copa, PA-C, Cecilie Kicks, NP, Ermalinda Barrios, PA-C, Christen Bame, NP, or Richardson Dopp, PA-C

## 2021-07-11 NOTE — Assessment & Plan Note (Signed)
Continue with high intensity statin atorvastatin 40 mg daily.  No myalgias.  Overall great.  Last LDL 50 with ALT 19.  He is seeing primary care physician soon for physical/blood work.  Creatinine 1.13 as well.  Hemoglobin 11.9.

## 2021-07-23 ENCOUNTER — Ambulatory Visit (INDEPENDENT_AMBULATORY_CARE_PROVIDER_SITE_OTHER): Payer: Medicare Other

## 2021-07-23 DIAGNOSIS — Z950 Presence of cardiac pacemaker: Secondary | ICD-10-CM

## 2021-07-23 DIAGNOSIS — I5022 Chronic systolic (congestive) heart failure: Secondary | ICD-10-CM

## 2021-07-25 NOTE — Progress Notes (Signed)
EPIC Encounter for ICM Monitoring  Patient Name: [place] is a 87 y.o. male Date: 07/25/2021 Primary Care Physican: Lujean Amel, MD Primary Cardiologist: Marlou Porch Electrophysiologist: Allred Bi-V Pacing: 94%            06/13/2021 Weight: 145 lbs                                                            Spoke with patient and heart failure questions reviewed.  Pt asymptomatic for fluid accumulation and feeling well.   CorVue thoracic impedance suggesting normal fluid levels.   Prescribed: No diuretic   Recommendations:  No changes and encouraged to call if experiencing any fluid symptoms.   Follow-up plan: ICM clinic phone appointment on 08/30/2021.  91 day device clinic remote transmission 08/29/2021.     EP/Cardiology Office Visits:  Recall 01/07/2022 with Dr Marlou Porch.  Recall 10/13/2021 with Oda Kilts, PA.   Copy of ICM check sent to Dr. [name].   3 month ICM trend: 07/23/2021.    12-14 Month ICM trend:       Rosalene Billings, RN 07/25/2021 9:40 AM

## 2021-08-03 DIAGNOSIS — H00012 Hordeolum externum right lower eyelid: Secondary | ICD-10-CM | POA: Diagnosis not present

## 2021-08-10 DIAGNOSIS — I495 Sick sinus syndrome: Secondary | ICD-10-CM | POA: Diagnosis not present

## 2021-08-10 DIAGNOSIS — E7800 Pure hypercholesterolemia, unspecified: Secondary | ICD-10-CM | POA: Diagnosis not present

## 2021-08-10 DIAGNOSIS — I340 Nonrheumatic mitral (valve) insufficiency: Secondary | ICD-10-CM | POA: Diagnosis not present

## 2021-08-10 DIAGNOSIS — I5022 Chronic systolic (congestive) heart failure: Secondary | ICD-10-CM | POA: Diagnosis not present

## 2021-08-10 DIAGNOSIS — Z79899 Other long term (current) drug therapy: Secondary | ICD-10-CM | POA: Diagnosis not present

## 2021-08-10 DIAGNOSIS — I2510 Atherosclerotic heart disease of native coronary artery without angina pectoris: Secondary | ICD-10-CM | POA: Diagnosis not present

## 2021-08-10 DIAGNOSIS — Z950 Presence of cardiac pacemaker: Secondary | ICD-10-CM | POA: Diagnosis not present

## 2021-08-10 DIAGNOSIS — Z0001 Encounter for general adult medical examination with abnormal findings: Secondary | ICD-10-CM | POA: Diagnosis not present

## 2021-08-10 DIAGNOSIS — I10 Essential (primary) hypertension: Secondary | ICD-10-CM | POA: Diagnosis not present

## 2021-08-23 DIAGNOSIS — I5022 Chronic systolic (congestive) heart failure: Secondary | ICD-10-CM | POA: Diagnosis not present

## 2021-08-23 DIAGNOSIS — E7800 Pure hypercholesterolemia, unspecified: Secondary | ICD-10-CM | POA: Diagnosis not present

## 2021-08-23 DIAGNOSIS — I10 Essential (primary) hypertension: Secondary | ICD-10-CM | POA: Diagnosis not present

## 2021-08-23 DIAGNOSIS — I2510 Atherosclerotic heart disease of native coronary artery without angina pectoris: Secondary | ICD-10-CM | POA: Diagnosis not present

## 2021-08-29 ENCOUNTER — Ambulatory Visit (INDEPENDENT_AMBULATORY_CARE_PROVIDER_SITE_OTHER): Payer: Medicare Other

## 2021-08-29 DIAGNOSIS — I442 Atrioventricular block, complete: Secondary | ICD-10-CM

## 2021-08-29 LAB — CUP PACEART REMOTE DEVICE CHECK
Battery Remaining Longevity: 58 mo
Battery Remaining Percentage: 73 %
Battery Voltage: 2.99 V
Brady Statistic AP VP Percent: 93 %
Brady Statistic AP VS Percent: 1.3 %
Brady Statistic AS VP Percent: 1.7 %
Brady Statistic AS VS Percent: 1 %
Brady Statistic RA Percent Paced: 89 %
Date Time Interrogation Session: 20230104020010
Implantable Lead Implant Date: 20160329
Implantable Lead Implant Date: 20160329
Implantable Lead Implant Date: 20210406
Implantable Lead Location: 753858
Implantable Lead Location: 753859
Implantable Lead Location: 753860
Implantable Lead Model: 1948
Implantable Pulse Generator Implant Date: 20210406
Lead Channel Impedance Value: 400 Ohm
Lead Channel Impedance Value: 640 Ohm
Lead Channel Impedance Value: 730 Ohm
Lead Channel Pacing Threshold Amplitude: 0.625 V
Lead Channel Pacing Threshold Amplitude: 0.625 V
Lead Channel Pacing Threshold Amplitude: 2.125 V
Lead Channel Pacing Threshold Pulse Width: 0.5 ms
Lead Channel Pacing Threshold Pulse Width: 0.5 ms
Lead Channel Pacing Threshold Pulse Width: 0.5 ms
Lead Channel Sensing Intrinsic Amplitude: 12 mV
Lead Channel Sensing Intrinsic Amplitude: 3.5 mV
Lead Channel Setting Pacing Amplitude: 1.625
Lead Channel Setting Pacing Amplitude: 2 V
Lead Channel Setting Pacing Amplitude: 2.625
Lead Channel Setting Pacing Pulse Width: 0.5 ms
Lead Channel Setting Pacing Pulse Width: 0.5 ms
Lead Channel Setting Sensing Sensitivity: 4 mV
Pulse Gen Model: 3562
Pulse Gen Serial Number: 6058048

## 2021-08-31 ENCOUNTER — Ambulatory Visit (INDEPENDENT_AMBULATORY_CARE_PROVIDER_SITE_OTHER): Payer: Medicare Other

## 2021-08-31 DIAGNOSIS — Z950 Presence of cardiac pacemaker: Secondary | ICD-10-CM

## 2021-08-31 DIAGNOSIS — I5022 Chronic systolic (congestive) heart failure: Secondary | ICD-10-CM

## 2021-08-31 NOTE — Progress Notes (Signed)
EPIC Encounter for ICM Monitoring  Patient Name: [place] is a 88 y.o. male Date: 08/31/2021 Primary Care Physican: Lujean Amel, MD Primary Cardiologist: Marlou Porch Electrophysiologist: Allred Bi-V Pacing: 94%            08/27/2021 Weight: 145 lbs                                                            Spoke with patient and heart failure questions reviewed.  Pt reports wedding ring finger is swollen.  He is under a lot of stress due to moving to a new home.  He reports he may be drinking too much fluids.  He does not think salt intake has changed.    CorVue thoracic impedance suggesting possible fluid accumulation starting 08/27/2021 but starting to trend back toward baseline.   Prescribed: No diuretic   Recommendations:  Recommendation to limit salt intake to 2000 mg daily and fluid intake to 64 oz daily.  Encouraged to call if experiencing any fluid symptoms.    Follow-up plan: ICM clinic phone appointment on 09/10/2021 to recheck fluid levels.  91 day device clinic remote transmission 11/28/2021.     EP/Cardiology Office Visits:  Recall 01/07/2022 with Dr Marlou Porch.  Recall 10/13/2021 with Oda Kilts, PA.   Copy of ICM check sent to Dr. [name] and Dr Marlou Porch for review.  3 month ICM trend: 08/28/2021.    12-14 Month ICM trend:     Rosalene Billings, RN 08/31/2021 11:57 AM

## 2021-09-10 NOTE — Progress Notes (Signed)
Remote pacemaker transmission.   

## 2021-09-12 NOTE — Progress Notes (Signed)
No ICM remote transmission received for 09/10/2021 and next ICM transmission scheduled for 10/01/2021.   °

## 2021-09-25 ENCOUNTER — Other Ambulatory Visit: Payer: Self-pay

## 2021-09-25 MED ORDER — CARVEDILOL 12.5 MG PO TABS: 12.5000 mg | ORAL_TABLET | Freq: Two times a day (BID) | ORAL | 3 refills | Status: DC

## 2021-10-01 ENCOUNTER — Ambulatory Visit (INDEPENDENT_AMBULATORY_CARE_PROVIDER_SITE_OTHER): Payer: Medicare Other

## 2021-10-01 DIAGNOSIS — Z950 Presence of cardiac pacemaker: Secondary | ICD-10-CM

## 2021-10-01 DIAGNOSIS — I5022 Chronic systolic (congestive) heart failure: Secondary | ICD-10-CM

## 2021-10-03 NOTE — Progress Notes (Signed)
EPIC Encounter for ICM Monitoring  Patient Name: [place] is a 88 y.o. male Date: 10/03/2021 Primary Care Physican: Lujean Amel, MD Primary Cardiologist: Marlou Porch Electrophysiologist: Allred Bi-V Pacing: 94%            08/27/2021 Weight: 145 lbs                                                            Spoke with patient and heart failure questions reviewed.  Pt asymptomatic for fluid accumulation.  He has settled in his new home after a stressful move.   CorVue thoracic impedance normal but was suggesting possible fluid accumulation starting 1/26-1/31.   Prescribed: No diuretic   Recommendations:    Encouraged to call if experiencing any fluid symptoms.    Follow-up plan: ICM clinic phone appointment on 11/05/2021.  91 day device clinic remote transmission 11/28/2021.     EP/Cardiology Office Visits:  10/18/2021 with Dr [name].   Recall 01/07/2022 with Dr Marlou Porch.  Recall 10/13/2021 with Oda Kilts, PA.   Copy of ICM check sent to Dr. [name].  3 month ICM trend: 01/29/2022.    12-14 Month ICM trend:     Rosalene Billings, RN 10/03/2021 2:32 PM

## 2021-10-08 DIAGNOSIS — H52203 Unspecified astigmatism, bilateral: Secondary | ICD-10-CM | POA: Diagnosis not present

## 2021-10-08 DIAGNOSIS — D3132 Benign neoplasm of left choroid: Secondary | ICD-10-CM | POA: Diagnosis not present

## 2021-10-08 DIAGNOSIS — H524 Presbyopia: Secondary | ICD-10-CM | POA: Diagnosis not present

## 2021-10-08 DIAGNOSIS — Z961 Presence of intraocular lens: Secondary | ICD-10-CM | POA: Diagnosis not present

## 2021-10-09 DIAGNOSIS — R051 Acute cough: Secondary | ICD-10-CM | POA: Diagnosis not present

## 2021-10-09 DIAGNOSIS — I517 Cardiomegaly: Secondary | ICD-10-CM | POA: Diagnosis not present

## 2021-10-10 DIAGNOSIS — I10 Essential (primary) hypertension: Secondary | ICD-10-CM | POA: Diagnosis not present

## 2021-10-17 DIAGNOSIS — E7800 Pure hypercholesterolemia, unspecified: Secondary | ICD-10-CM | POA: Diagnosis not present

## 2021-10-17 DIAGNOSIS — I10 Essential (primary) hypertension: Secondary | ICD-10-CM | POA: Diagnosis not present

## 2021-10-17 DIAGNOSIS — I5022 Chronic systolic (congestive) heart failure: Secondary | ICD-10-CM | POA: Diagnosis not present

## 2021-10-18 ENCOUNTER — Other Ambulatory Visit: Payer: Self-pay

## 2021-10-18 ENCOUNTER — Ambulatory Visit (INDEPENDENT_AMBULATORY_CARE_PROVIDER_SITE_OTHER): Payer: Medicare Other | Admitting: Internal Medicine

## 2021-10-18 ENCOUNTER — Encounter (HOSPITAL_BASED_OUTPATIENT_CLINIC_OR_DEPARTMENT_OTHER): Payer: Self-pay | Admitting: Internal Medicine

## 2021-10-18 VITALS — BP 112/68 | HR 68 | Ht 69.0 in | Wt 154.0 lb

## 2021-10-18 DIAGNOSIS — Z950 Presence of cardiac pacemaker: Secondary | ICD-10-CM

## 2021-10-18 DIAGNOSIS — I442 Atrioventricular block, complete: Secondary | ICD-10-CM

## 2021-10-18 DIAGNOSIS — I428 Other cardiomyopathies: Secondary | ICD-10-CM | POA: Diagnosis not present

## 2021-10-18 NOTE — Patient Instructions (Addendum)
Medication Instructions:  Your physician recommends that you continue on your current medications as directed. Please refer to the Current Medication list given to you today.  Labwork: None ordered.  Testing/Procedures: None ordered.  Follow-Up: Your physician wants you to follow-up in: one year with Dr. Vallery Ridge will receive a reminder letter in the mail two months in advance. If you don't receive a letter, please call our office to schedule the follow-up appointment.   Remote monitoring is used to monitor your Pacemaker from home. This monitoring reduces the number of office visits required to check your device to one time per year. It allows [place] to keep an eye on the functioning of your device to ensure it is working properly. You are scheduled for a device check from home on 11/05/2021. You may send your transmission at any time that day. If you have a wireless device, the transmission will be sent automatically. After your physician reviews your transmission, you will receive a postcard with your next transmission date.  Any Other Special Instructions Will Be Listed Below (If Applicable).  If you need a refill on your cardiac medications before your next appointment, please call your pharmacy.

## 2021-10-18 NOTE — Progress Notes (Signed)
PCP: Lujean Amel, MD Primary Cardiologist: Dr Marlou Porch Primary EP:  Dr Dema Severin Andrew Marshall is a 88 y.o. male who presents today for routine electrophysiology followup.  Since last being seen in our clinic, the patient reports doing very well.  Today, he denies symptoms of palpitations, chest pain, shortness of breath,  lower extremity edema, dizziness, presyncope, or syncope.  The patient is otherwise without complaint today.   Past Medical History:  Diagnosis Date   Allergic rhinitis    Arthritis    "thumbs primarily" (05/05/2015)   Basal cell carcinoma, face    "have had several; cut and burned off"   CAD (coronary artery disease)    a. 2001 s/p post MI- multiple PCI's to LCX/OM;  b. 2003 DES to OM;  c. 05/2012 MV: postlat infarct w/ mild peri-infarct ischemia. nl EF->Med Rx.   Complete heart block ([place])    St. Jude pacemaker implanted 11/22/14   ED (erectile dysfunction)    Esophageal reflux    "slight"   Essential hypertension, benign    History of atrial fibrillation    History of GI diverticular bleed    a. 05/2013 and 02/2014 - taken off of ASA for this reason.   Metatarsalgia    MI (myocardial infarction) ([place]) 2001   Moderate mitral insufficiency    a. 10/2014 Echo: EF 55-60%, Gr1 DD, mild AS, mod MR, sev dil LA, nl RV, mildly dil RA, mild TR, PASP 1mmHg.   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   Prostate cancer ([place]) 2008   S/P "radiation and seed implants"   Pure hypercholesterolemia    RLS (restless legs syndrome)    Past Surgical History:  Procedure Laterality Date   APPENDECTOMY  1956   BASAL CELL CARCINOMA EXCISION     "face"   BIV UPGRADE N/A 11/30/2019   Procedure: BIV PPM UPGRADE;  Surgeon: [name], MD;  Location: [facility];  Service: Cardiovascular;  Laterality: N/A;   CARDIAC CATHETERIZATION N/A 05/05/2015   Procedure: Left Heart Cath and Coronary Angiography;   Surgeon: Belva Crome, MD;  Location: [facility];  Service: Cardiovascular;  Laterality: N/A;   CARDIAC CATHETERIZATION N/A 05/05/2015   Procedure: Coronary Stent Intervention;  Surgeon: Belva Crome, MD;  Location: [facility];  Service: Cardiovascular;  Laterality: N/A;   CATARACT EXTRACTION W/ INTRAOCULAR LENS IMPLANT Left 12/2014   COLONOSCOPY W/ POLYPECTOMY     CORONARY ANGIOPLASTY     CORONARY ANGIOPLASTY WITH STENT PLACEMENT  04/2000; ? ~ 2002; 01/2002   ESTERNAL BEAM XRT AND SEEDS FOR PROSTATE CANCER     EYE SURGERY Right 07/2016   FOOT ARTHRODESIS, TRIPLE Right 2007   GANGLION CYST EXCISION Right 1970's   wrist   INSERT / REPLACE / REMOVE PACEMAKER  2017   LEFT HEART CATH AND CORONARY ANGIOGRAPHY N/A 09/19/2017   Procedure: LEFT HEART CATH AND CORONARY ANGIOGRAPHY;  Surgeon: Burnell Blanks, MD;  Location: [facility];  Service: Cardiovascular;  Laterality: N/A;   LUMBAR LAMINECTOMY/DECOMPRESSION MICRODISCECTOMY Left 09/18/2016   Procedure: LEFT LUMBAR THREE- LUMBAR FOUR LUMBAR LAMINOTOMY AND MICRODISCECTOMY;  Surgeon: Jovita Gamma, MD;  Location: [facility];  Service: Neurosurgery;  Laterality: Left;  LEFT LUMBAR 3- LUMBAR 4 LUMBAR LAMINOTOMY AND MICRODISCECTOMY   PERMANENT PACEMAKER INSERTION N/A 11/22/2014   Procedure: PERMANENT PACEMAKER INSERTION;  Surgeon: [name], MD;  Location: [facility];  Service: Cardiovascular;  Laterality: N/A;   PROSTATE BIOPSY     TONSILLECTOMY  1942    ROS- all systems are reviewed and negative except as per HPI above  Current Outpatient Medications  Medication Sig Dispense Refill   atorvastatin (LIPITOR) 40 MG tablet Take 1 tablet (40 mg total) by mouth daily. 90 tablet 3   Carboxymethylcellul-Glycerin (LUBRICATING EYE DROPS OP) Place 1 drop into both eyes daily as needed (dry eyes).     carvedilol (COREG) 12.5 MG tablet Take 1 tablet (12.5 mg total) by mouth 2 (two) times daily with a meal. 180 tablet 3   cetirizine  (ZYRTEC) 10 MG tablet Take 10 mg by mouth daily.     clopidogrel (PLAVIX) 75 MG tablet TAKE ONE TABLET BY MOUTH DAILY 90 tablet 2   nitroGLYCERIN (NITROSTAT) 0.4 MG SL tablet Place 1 tablet (0.4 mg total) under the tongue every 5 (five) minutes as needed for chest pain (MAX 3 TABLETS). 25 tablet 3   sacubitril-valsartan (ENTRESTO) 24-26 MG Take 1 tablet by mouth 2 (two) times daily. 60 tablet 11   No current facility-administered medications for this visit.    Physical Exam: Vitals:   10/18/21 1156  BP: 112/68  Pulse: 68  SpO2: 97%  Weight: 154 lb (69.9 kg)  Height: 5\' 9"  (1.753 m)    GEN- The patient is well appearing, alert and oriented x 3 today.   Head- normocephalic, atraumatic Eyes-  Sclera clear, conjunctiva pink Ears- hearing intact Oropharynx- clear Lungs- Clear to ausculation bilaterally, normal work of breathing Chest- pacemaker pocket is well healed Heart- Regular rate and rhythm, no murmurs, rubs or gallops, PMI not laterally displaced GI- soft, NT, ND, + BS Extremities- no clubbing, cyanosis, or edema  Pacemaker interrogation- reviewed in detail today,  See PACEART report  ekg tracing ordered today is personally reviewed and shows AV paced  Assessment and Plan:  1. Symptomatic complete heart block Normal biv pacemaker function See Pace Art report No changes today he is device dependant today  2. Nonischemic CM Clinically stable with CRT (NYHA CLass I)  3. HTN Stable No change required today  4. Afib No afib in 4 years Consider Kerr if AF returns  5. CAD No ischemic symptoms  Return in a year  [name] MD, [facility] 10/18/2021 12:16 PM

## 2021-11-05 ENCOUNTER — Ambulatory Visit (INDEPENDENT_AMBULATORY_CARE_PROVIDER_SITE_OTHER): Payer: Medicare Other

## 2021-11-05 DIAGNOSIS — I5022 Chronic systolic (congestive) heart failure: Secondary | ICD-10-CM | POA: Diagnosis not present

## 2021-11-05 DIAGNOSIS — Z950 Presence of cardiac pacemaker: Secondary | ICD-10-CM

## 2021-11-06 NOTE — Progress Notes (Signed)
EPIC Encounter for ICM Monitoring ? ?Patient Name: Andrew Marshall is a 88 y.o. male ?Date: 11/06/2021 ?Primary Care Physican: Lujean Amel, MD ?Primary Cardiologist: Marlou Porch ?Electrophysiologist: Allred ?Bi-V Pacing: 96%            ?08/27/2021 Weight: 145 lbs ?                                                         ?  ?Spoke with patient and heart failure questions reviewed.  Pt asymptomatic for fluid accumulation.  He he's the flu but had recovered.    ?  ?CorVue thoracic impedance suggesting normal fluid levels. ?  ?Prescribed: No diuretic ?  ?Recommendations:   No changes and encouraged to call if experiencing any fluid symptoms. ?  ?Follow-up plan: ICM clinic phone appointment on 12/10/2021.  91 day device clinic remote transmission 11/28/2021.   ?  ?EP/Cardiology Office Visits:  Recall 10/13/2022 with Dr [name].   Recall 01/07/2022 with Dr Marlou Porch.   ?  ?Copy of ICM check sent to Dr. [name]. ? ?3 month ICM trend: 11/05/2021. ? ? ? ?12-14 Month ICM trend:  ? ? ? ?Rosalene Billings, RN ?11/06/2021 ?9:16 AM ? ?

## 2021-11-28 ENCOUNTER — Ambulatory Visit (INDEPENDENT_AMBULATORY_CARE_PROVIDER_SITE_OTHER): Payer: Medicare Other

## 2021-11-28 DIAGNOSIS — I442 Atrioventricular block, complete: Secondary | ICD-10-CM

## 2021-11-28 LAB — CUP PACEART REMOTE DEVICE CHECK
Battery Remaining Longevity: 55 mo
Battery Remaining Percentage: 69 %
Battery Voltage: 2.98 V
Brady Statistic AP VP Percent: 89 %
Brady Statistic AP VS Percent: 1.3 %
Brady Statistic AS VP Percent: 6.2 %
Brady Statistic AS VS Percent: 1 %
Brady Statistic RA Percent Paced: 87 %
Date Time Interrogation Session: 20230405023445
Implantable Lead Implant Date: 20160329
Implantable Lead Implant Date: 20160329
Implantable Lead Implant Date: 20210406
Implantable Lead Location: 753858
Implantable Lead Location: 753859
Implantable Lead Location: 753860
Implantable Lead Model: 1948
Implantable Pulse Generator Implant Date: 20210406
Lead Channel Impedance Value: 390 Ohm
Lead Channel Impedance Value: 630 Ohm
Lead Channel Impedance Value: 690 Ohm
Lead Channel Pacing Threshold Amplitude: 0.5 V
Lead Channel Pacing Threshold Amplitude: 0.625 V
Lead Channel Pacing Threshold Amplitude: 2.5 V
Lead Channel Pacing Threshold Pulse Width: 0.5 ms
Lead Channel Pacing Threshold Pulse Width: 0.5 ms
Lead Channel Pacing Threshold Pulse Width: 0.5 ms
Lead Channel Sensing Intrinsic Amplitude: 12 mV
Lead Channel Sensing Intrinsic Amplitude: 3.7 mV
Lead Channel Setting Pacing Amplitude: 1.5 V
Lead Channel Setting Pacing Amplitude: 2 V
Lead Channel Setting Pacing Amplitude: 3 V
Lead Channel Setting Pacing Pulse Width: 0.5 ms
Lead Channel Setting Pacing Pulse Width: 0.5 ms
Lead Channel Setting Sensing Sensitivity: 4 mV
Pulse Gen Model: 3562
Pulse Gen Serial Number: 6058048

## 2021-12-10 ENCOUNTER — Ambulatory Visit (INDEPENDENT_AMBULATORY_CARE_PROVIDER_SITE_OTHER): Payer: Medicare Other

## 2021-12-10 DIAGNOSIS — I5022 Chronic systolic (congestive) heart failure: Secondary | ICD-10-CM | POA: Diagnosis not present

## 2021-12-10 DIAGNOSIS — Z950 Presence of cardiac pacemaker: Secondary | ICD-10-CM | POA: Diagnosis not present

## 2021-12-11 NOTE — Progress Notes (Signed)
EPIC Encounter for ICM Monitoring ? ?Patient Name: Andrew Marshall is a 88 y.o. male ?Date: 12/11/2021 ?Primary Care Physican: Lujean Amel, MD ?Primary Cardiologist: Marlou Porch ?Electrophysiologist: Allred ?Bi-V Pacing: 95%            ?12/11/2021 Weight: 140-142 lbs ?                                                         ?  ?Spoke with patient and heart failure questions reviewed.  Pt asymptomatic for fluid accumulation.  ?  ?CorVue thoracic impedance suggesting normal fluid levels. Impedance was suggesting possible fluid accumulation from 3/21-3/31. ?  ?Prescribed: No diuretic ?  ?Recommendations:   No changes and encouraged to call if experiencing any fluid symptoms. ?  ?Follow-up plan: ICM clinic phone appointment on 01/14/2022.  91 day device clinic remote transmission 02/27/2022.   ?  ?EP/Cardiology Office Visits:  Recall 10/13/2022 with Dr [name].   Recall 01/07/2022 with Dr Marlou Porch or APP.   ?  ?Copy of ICM check sent to Dr. [name]. ? ?3 month ICM trend: 12/10/2021. ? ? ? ?12-14 Month ICM trend:  ? ? ? ?Rosalene Billings, RN ?12/11/2021 ?2:16 PM ? ?

## 2021-12-13 NOTE — Progress Notes (Signed)
Remote pacemaker transmission.   

## 2022-01-07 DIAGNOSIS — H9211 Otorrhea, right ear: Secondary | ICD-10-CM | POA: Diagnosis not present

## 2022-01-14 ENCOUNTER — Ambulatory Visit (INDEPENDENT_AMBULATORY_CARE_PROVIDER_SITE_OTHER): Payer: Medicare Other

## 2022-01-14 DIAGNOSIS — Z950 Presence of cardiac pacemaker: Secondary | ICD-10-CM

## 2022-01-14 DIAGNOSIS — I5022 Chronic systolic (congestive) heart failure: Secondary | ICD-10-CM | POA: Diagnosis not present

## 2022-01-15 NOTE — Progress Notes (Signed)
Cardiology Office Note    Date:  01/23/2022   ID:  Andrew Marshall, DOB 1931/01/17, MRN [id number]   PCP:  Andrew Marshall, [place]  Cardiologist:  Andrew Furbish, MD   Advanced Practice Provider:  No care team member to display Electrophysiologist:  Andrew Grayer, MD   [phone]   Chief Complaint  Patient presents with   Follow-up    History of Present Illness:  Andrew Marshall is a 88 y.o. male  with history of coronary artery disease, hypertension, hyperlipidemia, and pacemaker, nonischemic cardiomyopathy complete heart block EF previously 25% increased to 40% with biventricular upgrade.   Patient with CAD status post lateral wall MI 2001 stent to the proximal circumflex in-stent restenosis and Cypher stent to OM 2 covering the prior stent, 2016 and another proximal circumflex stent placed.  Last cath 2019 patent stents in the proximal mid circumflex with mild restenosis ostium of the obtuse marginal branch has 70 to 80% stenosis unchanged from 2016 at time of deployment of the circumflex stent medical management recommended.  Patient last saw Andrew Marshall 07/11/2021 at which time he was doing well.  Blood pressures were running low so no changes made  Most recent device check 11/28/21 stable.  Patient comes in for f/u. He moved to independent living at [place], not able to ride his bike as frequently. He does some weights and rowing machine. Had been riding 15 Andrew 3 days a week. No chest pain, dyspnea, palpitations, dizziness.       Past Medical History:  Diagnosis Date   Allergic rhinitis    Arthritis    "thumbs primarily" (05/05/2015)   Basal cell carcinoma, face    "have had several; cut and burned off"   CAD (coronary artery disease)    a. 2001 s/p post MI- multiple PCI's to LCX/OM;  b. 2003 DES to OM;  c. 05/2012 MV: postlat infarct w/ mild peri-infarct ischemia. nl EF->Med Rx.   Complete heart block ([place])    St. Jude pacemaker implanted  11/22/14   ED (erectile dysfunction)    Esophageal reflux    "slight"   Essential hypertension, benign    History of atrial fibrillation    History of GI diverticular bleed    a. 05/2013 and 02/2014 - taken off of ASA for this reason.   Metatarsalgia    MI (myocardial infarction) ([place]) 2001   Moderate mitral insufficiency    a. 10/2014 Echo: EF 55-60%, Gr1 DD, mild AS, mod MR, sev dil LA, nl RV, mildly dil RA, mild TR, PASP 73mHg.   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   [place]   S/P "radiation and seed implants"   Pure hypercholesterolemia    RLS (restless legs syndrome)     Past Surgical History:  Procedure Laterality Date   APPENDECTOMY  1956   BASAL CELL CARCINOMA EXCISION     "face"   BIV UPGRADE N/A 11/30/2019   Procedure: BIV PPM UPGRADE;  Surgeon: AThompson Grayer MD;  [facility];  Service: Cardiovascular;  Laterality: N/A;   CARDIAC CATHETERIZATION N/A 05/05/2015   Procedure: Left Heart Cath and Coronary Angiography;  Surgeon: HBelva Crome MD;  [facility];  Service: Cardiovascular;  Laterality: N/A;   CARDIAC CATHETERIZATION N/A 05/05/2015   Procedure: Coronary Stent Intervention;  Surgeon: HBelva Crome MD;  Location: [facility];  Service: Cardiovascular;  Laterality: N/A;   CATARACT EXTRACTION W/ INTRAOCULAR LENS IMPLANT Left 12/2014   COLONOSCOPY W/ POLYPECTOMY     CORONARY ANGIOPLASTY     CORONARY ANGIOPLASTY WITH STENT PLACEMENT  04/2000; ? ~ 2002; 01/2002   ESTERNAL BEAM XRT AND SEEDS FOR PROSTATE CANCER     EYE SURGERY Right 07/2016   FOOT ARTHRODESIS, TRIPLE Right 2007   GANGLION CYST EXCISION Right 1970's   wrist   INSERT / REPLACE / REMOVE PACEMAKER  2017   LEFT HEART CATH AND CORONARY ANGIOGRAPHY N/A 09/19/2017   Procedure: LEFT HEART CATH AND CORONARY ANGIOGRAPHY;  Surgeon: Burnell Blanks, MD;  Location: [facility];  Service: Cardiovascular;  Laterality: N/A;   LUMBAR LAMINECTOMY/DECOMPRESSION MICRODISCECTOMY Left 09/18/2016   Procedure: LEFT LUMBAR THREE- LUMBAR FOUR LUMBAR LAMINOTOMY AND MICRODISCECTOMY;  Surgeon: Jovita Gamma, MD;  Location: [facility];  Service: Neurosurgery;  Laterality: Left;  LEFT LUMBAR 3- LUMBAR 4 LUMBAR LAMINOTOMY AND MICRODISCECTOMY   PERMANENT PACEMAKER INSERTION N/A 11/22/2014   Procedure: PERMANENT PACEMAKER INSERTION;  Surgeon: Andrew Grayer, MD;  Location: [facility];  Service: Cardiovascular;  Laterality: N/A;   PROSTATE BIOPSY     TONSILLECTOMY  1942    Current Medications: Current Meds  Medication Sig   atorvastatin (LIPITOR) 40 MG tablet Take 1 tablet (40 mg total) by mouth daily.   Carboxymethylcellul-Glycerin (LUBRICATING EYE DROPS OP) Place 1 drop into both eyes daily as needed (dry eyes).   carvedilol (COREG) 12.5 MG tablet Take 1 tablet (12.5 mg total) by mouth 2 (two) times daily with a meal.   cetirizine (ZYRTEC) 10 MG tablet Take 10 mg by mouth daily.   clopidogrel (PLAVIX) 75 MG tablet TAKE ONE TABLET BY MOUTH DAILY   nitroGLYCERIN (NITROSTAT) 0.4 MG SL tablet Place 1 tablet (0.4 mg total) under the tongue every 5 (five) minutes as needed for chest pain (MAX 3 TABLETS).   sacubitril-valsartan (ENTRESTO) 24-26 MG Take 1 tablet by mouth 2 (two) times daily.     Allergies:   Ambien [zolpidem] and Erythromycin   Social History   Socioeconomic History   Marital status: Married    Spouse name: Not on file   Number of children: Not on file   Years of education: Not on file   Highest education level: Not on file  Occupational History   Not on file  Tobacco Use   Smoking status: Former    Packs/day: 0.50    Years: 50.00    Pack years: 25.00    Types: Cigarettes    Quit date: 05/22/2000    Years since quitting: 21.6   Smokeless tobacco: Never  Vaping Use   Vaping Use: Never used  Substance and Sexual Activity   Alcohol use: Yes    Comment:  05/05/2015 "3-6 glasses of wine/day"   Drug use: No   Sexual activity: Yes  Other Topics Concern   Not on file  Social History Narrative   Not on file   Social Determinants of Health   Financial Resource Strain: Not on file  Food Insecurity: Not on file  Transportation Needs: Not on file  Physical Activity: Not on file  Stress: Not on file  Social Connections: Not on file     Family History:  The patient's  family history includes Breast cancer in his mother; Heart disease in his father.   ROS:   Please see the history of present illness.    ROS  All other systems reviewed and are negative.   PHYSICAL EXAM:   VS:  BP (!) 102/58   Pulse 78   Ht '5\' 9"'$  (1.753 m)   Wt 151 lb 6.4 oz (68.7 kg)   SpO2 98%   BMI 22.36 kg/m   Physical Exam  GEN: Thin, in no acute distress  Neck: no JVD, carotid bruits, or masses Cardiac:RRR; 1/6 systolic murmur apex Respiratory:  clear to auscultation bilaterally, normal work of breathing GI: soft, nontender, nondistended, + BS Ext: without cyanosis, clubbing, or edema, Good distal pulses bilaterally Neuro:  Alert and Oriented x 3, Psych: euthymic mood, full affect  Wt Readings from Last 3 Encounters:  01/23/22 151 lb 6.4 oz (68.7 kg)  10/18/21 154 lb (69.9 kg)  07/11/21 154 lb (69.9 kg)      Studies/Labs Reviewed:   EKG:  EKG is not ordered today.   Recent Labs: No results found for requested labs within last 8760 hours.   Lipid Panel    Component Value Date/Time   CHOL 121 05/06/2019 1109   TRIG 60 05/06/2019 1109   HDL 58 05/06/2019 1109   CHOLHDL 2.1 05/06/2019 1109   CHOLHDL 3 04/12/2014 0951   VLDL 21.8 04/12/2014 0951   LDLCALC 50 05/06/2019 1109    Additional studies/ records that were reviewed today include:  ECHO 06/12/2020:  1. Left ventricular ejection fraction, by estimation, is 35 to 40%. The  left ventricle has moderately decreased function. The left ventricle  demonstrates regional wall motion abnormalities  with basal to mid  inferolateral and anterolateral akinesis.  There is mild left ventricular hypertrophy. Left ventricular diastolic  parameters are consistent with Grade II diastolic dysfunction  (pseudonormalization).   2. Right ventricular systolic function is normal. The right ventricular  size is mildly enlarged. There is normal pulmonary artery systolic  pressure. The estimated right ventricular systolic pressure is 25.8 mmHg.   3. Left atrial size was severely dilated.   4. Right atrial size was mildly dilated.   5. The mitral valve is abnormal. Moderate to severe mitral valve  regurgitation. Suspect infarct-related MR with akinetic lateral wall,  moderate MR by PISA ERO (0.21 cm^2). No evidence of mitral stenosis.   6. The aortic valve is tricuspid. Aortic valve regurgitation is not  visualized. Mild aortic valve stenosis. Aortic valve area, by VTI measures  1.88 cm. Aortic valve mean gradient measures 10.0 mmHg.   7. The inferior vena cava is normal in size with greater than 50%  respiratory variability, suggesting right atrial pressure of 3 mmHg.    ECHO 05/06/2019:  1. The left ventricle has not been assessed 25%. The cavity size was  normal. Left ventricular diastolic Doppler parameters are consistent with  impaired relaxation.   2. LVEF is approximately 25% with diffuse hypokinesis, worse in the  anterior and lateral walls.   3. The right ventricle has normal systolic function. The cavity was  normal. There is mildly increased right ventricular wall thickness.   4. Mitral valve regurgitation is moderate by color flow Doppler.    Cath 09/19/2017 Prox RCA to Mid RCA lesion is 30% stenosed. Prox Cx to Mid Cx lesion is 20% stenosed. Ost 2nd Mrg lesion is 70% stenosed. Prox LAD lesion is 20% stenosed. 1. Single vessel CAD 2. Patent stents proximal and mid Circumflex with mild restenosis.  3. The small caliber obtuse marginal branch is jailed by the Circumflex stent. The  ostium of the obtuse marginal branch has a 70-80% stenosis  which is unchanged from 2016 at the time of deployment of the circumflex stent.  4. Mild non-obstructive disease in the large, dominant RCA and in the LAD which reaches the apex.  5. Mild elevation LV filling pressures  Recommendations: Continue medical management of CAD. Consider low dose lasix given elevated filling pressures.      Risk Assessment/Calculations:         ASSESSMENT:    1. Coronary artery disease involving native coronary artery of native heart without angina pectoris   2. Ischemic cardiomyopathy   3. Chronic systolic heart failure (HCC)   4. Cardiac pacemaker in situ   5. Moderate mitral insufficiency   6. Hyperlipidemia, unspecified hyperlipidemia type      PLAN:  In order of problems listed above:  CAD status post lateral wall MI 2001 stent to the proximal circumflex in-stent restenosis and Cypher stent to OM 2 covering the prior stent, 2016 and another proximal circumflex stent placed.  Last cath 2019 patent stents in the proximal mid circumflex with mild restenosis ostium of the obtuse marginal branch has 70 to 80% stenosis unchanged from 2016 at time of deployment of the circumflex stent medical management recommended. No bleeding problems on plavix. No angina. Biking 15 Andrew without symptoms.   Moderate MR-update echo  ICM EF 35-40% on echo 2019 on entresto and coreg. Will update echo.  Chronic systolic CHF status post biventricular upgrade of ICD also on Entresto.  Last echo 06/12/2020 LVEF 35 to 40%.  Recent Corvue thoracic impedance suggested normal fluid levels. No CHF on exam. Update echo  Hyperlipidemia LDL 59 07/2021  Shared Decision Making/Informed Consent        Medication Adjustments/Labs and Tests Ordered: Current medicines are reviewed at length with the patient today.  Concerns regarding medicines are outlined above.  Medication changes, Labs and Tests ordered today are listed in  the Patient Instructions below. Patient Instructions  Medication Instructions:  Your physician recommends that you continue on your current medications as directed. Please refer to the Current Medication list given to you today.  *If you need a refill on your cardiac medications before your next appointment, please call your pharmacy*   Lab Work: TODAY: CMET, CBC, TSH  If you have labs (blood work) drawn today and your tests are completely normal, you will receive your results only by: [place] (if you have MyChart) OR A paper copy in the mail If you have any lab test that is abnormal or we need to change your treatment, we will call you to review the results.   Testing/Procedures: Your physician has requested that you have an echocardiogram. Echocardiography is a painless test that uses sound waves to create images of your heart. It provides your doctor with information about the size and shape of your heart and how well your heart's chambers and valves are working. This procedure takes approximately one hour. There are no restrictions for this procedure.   Follow-Up: At [facility], you and your health needs are our priority.  As part of our continuing mission to provide you with exceptional heart care, we have created designated Provider Care Teams.  These Care Teams include your primary Cardiologist (physician) and Advanced Practice Providers (APPs -  Physician Assistants and Nurse Practitioners) who all work together to provide you with the care you need, when you need it.    Your next appointment:   6 month(s)  The format for your next appointment:   In Person  Provider:  Candee Furbish, MD {     Signed, Ermalinda Barrios, PA-C  01/23/2022 1:19 PM    [facility] [place], [place],   [address] Phone: [phone]; Fax: [phone]

## 2022-01-15 NOTE — Progress Notes (Signed)
EPIC Encounter for ICM Monitoring  Patient Name: Andrew Marshall is a 88 y.o. male Date: 01/15/2022 Primary Care Physican: Lujean Amel, MD Primary Cardiologist: Marlou Porch Electrophysiologist: Allred Bi-V Pacing: 95%            12/11/2021 Weight: 140-142 lbs                                                            Spoke with patient and heart failure questions reviewed.  Pt asymptomatic for fluid accumulation.  Reports feeling well at this time and voices no complaints.     CorVue thoracic impedance suggesting normal fluid levels but was suggesting possible fluid accumulation from 5/4-5/19.   Prescribed: No diuretic   Recommendations:   Advised too much salt and fluid intake can increase risk for fluid accumulation.  No changes and encouraged to call if experiencing any fluid symptoms.   Follow-up plan: ICM clinic phone appointment on 02/18/2022.  91 day device clinic remote transmission 02/27/2022.     EP/Cardiology Office Visits:  Recall 10/13/2022 with Dr [name].   Recall 01/07/2022 with Dr Marlou Porch or APP.     Copy of ICM check sent to Dr. [name].  3 month ICM trend: 01/14/2022.    12-14 Month ICM trend:     Rosalene Billings, RN 01/15/2022 9:05 AM

## 2022-01-22 DIAGNOSIS — L821 Other seborrheic keratosis: Secondary | ICD-10-CM | POA: Diagnosis not present

## 2022-01-22 DIAGNOSIS — L309 Dermatitis, unspecified: Secondary | ICD-10-CM | POA: Diagnosis not present

## 2022-01-22 DIAGNOSIS — Z85828 Personal history of other malignant neoplasm of skin: Secondary | ICD-10-CM | POA: Diagnosis not present

## 2022-01-23 ENCOUNTER — Ambulatory Visit: Payer: Medicare Other | Admitting: Physician Assistant

## 2022-01-23 ENCOUNTER — Encounter: Payer: Self-pay | Admitting: Physician Assistant

## 2022-01-23 VITALS — BP 102/58 | HR 78 | Ht 69.0 in | Wt 151.4 lb

## 2022-01-23 DIAGNOSIS — I2510 Atherosclerotic heart disease of native coronary artery without angina pectoris: Secondary | ICD-10-CM | POA: Diagnosis not present

## 2022-01-23 DIAGNOSIS — Z950 Presence of cardiac pacemaker: Secondary | ICD-10-CM

## 2022-01-23 DIAGNOSIS — I5022 Chronic systolic (congestive) heart failure: Secondary | ICD-10-CM

## 2022-01-23 DIAGNOSIS — E785 Hyperlipidemia, unspecified: Secondary | ICD-10-CM

## 2022-01-23 DIAGNOSIS — I340 Nonrheumatic mitral (valve) insufficiency: Secondary | ICD-10-CM | POA: Diagnosis not present

## 2022-01-23 DIAGNOSIS — I255 Ischemic cardiomyopathy: Secondary | ICD-10-CM | POA: Diagnosis not present

## 2022-01-23 NOTE — Patient Instructions (Signed)
Medication Instructions:  Your physician recommends that you continue on your current medications as directed. Please refer to the Current Medication list given to you today.  *If you need a refill on your cardiac medications before your next appointment, please call your pharmacy*   Lab Work: TODAY: CMET, CBC, TSH  If you have labs (blood work) drawn today and your tests are completely normal, you will receive your results only by: [place] (if you have MyChart) OR A paper copy in the mail If you have any lab test that is abnormal or we need to change your treatment, we will call you to review the results.   Testing/Procedures: Your physician has requested that you have an echocardiogram. Echocardiography is a painless test that uses sound waves to create images of your heart. It provides your doctor with information about the size and shape of your heart and how well your heart's chambers and valves are working. This procedure takes approximately one hour. There are no restrictions for this procedure.   Follow-Up: At [facility], you and your health needs are our priority.  As part of our continuing mission to provide you with exceptional heart care, we have created designated Provider Care Teams.  These Care Teams include your primary Cardiologist (physician) and Advanced Practice Providers (APPs -  Physician Assistants and Nurse Practitioners) who all work together to provide you with the care you need, when you need it.    Your next appointment:   6 month(s)  The format for your next appointment:   In Person  Provider:   Candee Furbish, MD {

## 2022-01-24 LAB — COMPREHENSIVE METABOLIC PANEL
ALT: 20 IU/L (ref 0–44)
AST: 24 IU/L (ref 0–40)
Albumin/Globulin Ratio: 2.2 (ref 1.2–2.2)
Albumin: 3.9 g/dL (ref 3.6–4.6)
Alkaline Phosphatase: 57 IU/L (ref 44–121)
BUN/Creatinine Ratio: 21 (ref 10–24)
BUN: 22 mg/dL (ref 8–27)
Bilirubin Total: 1.2 mg/dL (ref 0.0–1.2)
CO2: 25 mmol/L (ref 20–29)
Calcium: 8.9 mg/dL (ref 8.6–10.2)
Chloride: 103 mmol/L (ref 96–106)
Creatinine, Ser: 1.04 mg/dL (ref 0.76–1.27)
Globulin, Total: 1.8 g/dL (ref 1.5–4.5)
Glucose: 143 mg/dL — ABNORMAL HIGH (ref 70–99)
Potassium: 4.8 mmol/L (ref 3.5–5.2)
Sodium: 139 mmol/L (ref 134–144)
Total Protein: 5.7 g/dL — ABNORMAL LOW (ref 6.0–8.5)
eGFR: 69 mL/min/{1.73_m2} (ref >59)

## 2022-01-24 LAB — CBC
Hematocrit: 34.3 % — ABNORMAL LOW (ref 37.5–51.0)
Hemoglobin: 11.9 g/dL — ABNORMAL LOW (ref 13.0–17.7)
MCH: 31.6 pg (ref 26.6–33.0)
MCHC: 34.7 g/dL (ref 31.5–35.7)
MCV: 91 fL (ref 79–97)
Platelets: 178 10*3/uL (ref 150–450)
RBC: 3.76 x10E6/uL — ABNORMAL LOW (ref 4.14–5.80)
RDW: 14.5 % (ref 11.6–15.4)
WBC: 8.3 10*3/uL (ref 3.4–10.8)

## 2022-01-24 LAB — TSH: TSH: 1.03 u[IU]/mL (ref 0.450–4.500)

## 2022-02-13 ENCOUNTER — Ambulatory Visit (HOSPITAL_COMMUNITY): Payer: Medicare Other | Attending: Cardiology

## 2022-02-13 DIAGNOSIS — I255 Ischemic cardiomyopathy: Secondary | ICD-10-CM | POA: Diagnosis not present

## 2022-02-13 DIAGNOSIS — I5022 Chronic systolic (congestive) heart failure: Secondary | ICD-10-CM | POA: Insufficient documentation

## 2022-02-13 LAB — ECHOCARDIOGRAM COMPLETE
AR max vel: 1.96 cm2
AV Area VTI: 2.37 cm2
AV Area mean vel: 1.95 cm2
AV Mean grad: 7.6 mmHg
AV Peak grad: 13.6 mmHg
Ao pk vel: 1.84 m/s
Area-P 1/2: 2.78 cm2
MV M vel: 6 m/s
MV Peak grad: 143.8 mmHg
Radius: 0.6 cm
S' Lateral: 3.47 cm

## 2022-02-14 ENCOUNTER — Telehealth: Payer: Self-pay | Admitting: Cardiology

## 2022-02-14 NOTE — Telephone Encounter (Signed)
Pt is calling in regards to echo results. Transferred to April Garrison, [place].

## 2022-02-14 NOTE — Telephone Encounter (Signed)
The patient has been notified of the result and verbalized understanding.  All questions (if any) were answered. Andrew Marshall, [place] 02/14/2022 1:12 PM

## 2022-02-18 ENCOUNTER — Ambulatory Visit (INDEPENDENT_AMBULATORY_CARE_PROVIDER_SITE_OTHER): Payer: Medicare Other

## 2022-02-18 DIAGNOSIS — I10 Essential (primary) hypertension: Secondary | ICD-10-CM | POA: Diagnosis not present

## 2022-02-18 DIAGNOSIS — Z950 Presence of cardiac pacemaker: Secondary | ICD-10-CM

## 2022-02-18 DIAGNOSIS — I5022 Chronic systolic (congestive) heart failure: Secondary | ICD-10-CM | POA: Diagnosis not present

## 2022-02-18 DIAGNOSIS — I2510 Atherosclerotic heart disease of native coronary artery without angina pectoris: Secondary | ICD-10-CM | POA: Diagnosis not present

## 2022-02-18 DIAGNOSIS — E7800 Pure hypercholesterolemia, unspecified: Secondary | ICD-10-CM | POA: Diagnosis not present

## 2022-02-27 ENCOUNTER — Ambulatory Visit (INDEPENDENT_AMBULATORY_CARE_PROVIDER_SITE_OTHER): Payer: Medicare Other

## 2022-02-27 DIAGNOSIS — I5022 Chronic systolic (congestive) heart failure: Secondary | ICD-10-CM | POA: Diagnosis not present

## 2022-02-27 DIAGNOSIS — I255 Ischemic cardiomyopathy: Secondary | ICD-10-CM | POA: Diagnosis not present

## 2022-02-27 DIAGNOSIS — E7800 Pure hypercholesterolemia, unspecified: Secondary | ICD-10-CM | POA: Diagnosis not present

## 2022-02-27 DIAGNOSIS — K4090 Unilateral inguinal hernia, without obstruction or gangrene, not specified as recurrent: Secondary | ICD-10-CM | POA: Diagnosis not present

## 2022-02-27 DIAGNOSIS — I2510 Atherosclerotic heart disease of native coronary artery without angina pectoris: Secondary | ICD-10-CM | POA: Diagnosis not present

## 2022-02-27 DIAGNOSIS — I10 Essential (primary) hypertension: Secondary | ICD-10-CM | POA: Diagnosis not present

## 2022-03-01 LAB — CUP PACEART REMOTE DEVICE CHECK
Battery Remaining Longevity: 52 mo
Battery Remaining Percentage: 65 %
Battery Voltage: 2.98 V
Brady Statistic AP VP Percent: 93 %
Brady Statistic AP VS Percent: 1 %
Brady Statistic AS VP Percent: 3.1 %
Brady Statistic AS VS Percent: 1 %
Brady Statistic RA Percent Paced: 91 %
Date Time Interrogation Session: 20230705032115
Implantable Lead Implant Date: 20160329
Implantable Lead Implant Date: 20160329
Implantable Lead Implant Date: 20210406
Implantable Lead Location: 753858
Implantable Lead Location: 753859
Implantable Lead Location: 753860
Implantable Lead Model: 1948
Implantable Pulse Generator Implant Date: 20210406
Lead Channel Impedance Value: 400 Ohm
Lead Channel Impedance Value: 650 Ohm
Lead Channel Impedance Value: 750 Ohm
Lead Channel Pacing Threshold Amplitude: 0.625 V
Lead Channel Pacing Threshold Amplitude: 0.75 V
Lead Channel Pacing Threshold Amplitude: 2.75 V
Lead Channel Pacing Threshold Pulse Width: 0.5 ms
Lead Channel Pacing Threshold Pulse Width: 0.5 ms
Lead Channel Pacing Threshold Pulse Width: 0.5 ms
Lead Channel Sensing Intrinsic Amplitude: 1.6 mV
Lead Channel Sensing Intrinsic Amplitude: 12 mV
Lead Channel Setting Pacing Amplitude: 1.625
Lead Channel Setting Pacing Amplitude: 2 V
Lead Channel Setting Pacing Amplitude: 3.25 V
Lead Channel Setting Pacing Pulse Width: 0.5 ms
Lead Channel Setting Pacing Pulse Width: 0.5 ms
Lead Channel Setting Sensing Sensitivity: 4 mV
Pulse Gen Model: 3562
Pulse Gen Serial Number: 6058048

## 2022-03-20 NOTE — Progress Notes (Signed)
Remote pacemaker transmission.   

## 2022-03-25 ENCOUNTER — Ambulatory Visit (INDEPENDENT_AMBULATORY_CARE_PROVIDER_SITE_OTHER): Payer: Medicare Other

## 2022-03-25 DIAGNOSIS — Z950 Presence of cardiac pacemaker: Secondary | ICD-10-CM | POA: Diagnosis not present

## 2022-03-25 DIAGNOSIS — I5022 Chronic systolic (congestive) heart failure: Secondary | ICD-10-CM | POA: Diagnosis not present

## 2022-03-27 ENCOUNTER — Telehealth: Payer: Self-pay

## 2022-03-27 NOTE — Telephone Encounter (Signed)
Remote ICM transmission received.  Attempted call to patient regarding ICM remote transmission and left detailed message per DPR.  Advised to return call for any fluid symptoms or questions. Next ICM remote transmission scheduled 04/30/2022.    

## 2022-03-27 NOTE — Progress Notes (Signed)
EPIC Encounter for ICM Monitoring  Patient Name: Andrew Marshall is a 88 y.o. male Date: 03/27/2022 Primary Care Physican: Lujean Amel, MD Primary Cardiologist: Marlou Porch Electrophysiologist: Allred Bi-V Pacing: 96%            01/23/2022 Office Weight: 151 lbs                                                            Attempted call to patient and unable to reach.  Left detailed message per DPR regarding transmission. Transmission reviewed.    CorVue thoracic impedance suggesting normal fluid levels.   Prescribed: No diuretic   Recommendations:  Left voice mail with ICM number and encouraged to call if experiencing any fluid symptoms.   Follow-up plan: ICM clinic phone appointment on 04/30/2022.  91 day device clinic remote transmission 05/31/2022.     EP/Cardiology Office Visits:  Recall 10/13/2022 with Dr [name].  07/23/2022 with Dr Marlou Porch.     Copy of ICM check sent to Dr. [name].  3 month ICM trend: 03/25/2022.    12-14 Month ICM trend:     Rosalene Billings, RN 03/27/2022 12:41 PM

## 2022-04-09 DIAGNOSIS — K4091 Unilateral inguinal hernia, without obstruction or gangrene, recurrent: Secondary | ICD-10-CM | POA: Diagnosis not present

## 2022-04-10 ENCOUNTER — Telehealth: Payer: Self-pay | Admitting: *Deleted

## 2022-04-10 NOTE — Telephone Encounter (Signed)
   Pre-operative Risk Assessment    Patient Name: Andrew Marshall  DOB: [birth year] MRN: [id number]      Request for Surgical Clearance    Procedure:   HERNIA SURGERY  Date of Surgery:  Clearance TBD                                 Surgeon:  Ralene Ok, MD Surgeon's Group or Practice Name:  [facility] Phone number:  [phone] Fax number:  [phone]  ATTN:  [name], CMA   Type of Clearance Requested:   - Pharmacy:  Hold Clopidogrel (Plavix) NOT INDICATED   Type of Anesthesia:  General    Additional requests/questions:    [name]   04/10/2022, 9:00 AM

## 2022-04-10 NOTE — Telephone Encounter (Signed)
   Patient Name: Andrew Marshall  DOB: 1931-08-26 MRN: [id number]  Primary Cardiologist: Candee Furbish, MD  Chart reviewed as part of pre-operative protocol coverage.   88 year old male with a history of CAD s/p stent to pLCx with ISR and cyper stent to OM2 (covering prior stent), stent-pLCx, complete heart block s/p PPM, ICM, systolic heart failure, mitral valve regurgitation, hypertension, and hyperlipidemia.  Most recent cath in 2019 showed stable anatomy.  Most recent echocardiogram June 2023 showed EF 35 to 40%, moderately decreased LV function, RWMA, moderate eccentric LVH of the septal segment, G2 DD, elevated LVEDP, normal RV systolic function, moderate mitral valve regurgitation, moderate MAC, mild aortic valve stenosis.  He was last seen in the office on 09/26/2021 was stable from a cardiac standpoint, biking 15 miles without symptoms.  Received surgical clearance request for upcoming hernia surgery, date TBD with request to hold Plavix prior to surgery.  Dr. Marlou Porch, given patient's history of CAD with multiple stents and ISR, please advise on holding Plavix prior to procedure. Please route your response to P CV DIV PREOP.   Thank you.   Lenna Sciara, NP 04/10/2022, 12:24 PM

## 2022-04-11 ENCOUNTER — Telehealth: Payer: Self-pay | Admitting: *Deleted

## 2022-04-11 NOTE — Telephone Encounter (Signed)
   Name: Andrew Marshall  DOB: 11/10/1930  MRN: [id number]  Primary Cardiologist: Candee Furbish, MD   Preoperative team, please contact this patient and set up a phone call appointment for further preoperative risk assessment. Please obtain consent and complete medication review. Thank you for your help.  I confirm that guidance regarding antiplatelet and oral anticoagulation therapy has been completed and, if necessary, noted below.  Per Dr. Marlou Porch, primary cardiologist, he may hold Plavix for 5 days prior to surgery. Please resume Plavix as soon as possible postprocedure, at the discretion of the surgeon.   Lenna Sciara, NP 04/11/2022, 9:00 AM [place]

## 2022-04-11 NOTE — Telephone Encounter (Signed)
Follow Up:     Patient is returning Jennifer's call from today.

## 2022-04-11 NOTE — Telephone Encounter (Signed)
Pt is scheduled for a tele visit 04/18/22 2:20.

## 2022-04-11 NOTE — Telephone Encounter (Signed)
Pt has been scheduled a tele visit, 04/18/22 2:20.  Consent on file / medications reconciled.    Patient Consent for Virtual Visit        Andrew Marshall has provided verbal consent on 04/11/2022 for a virtual visit (video or telephone).   CONSENT FOR VIRTUAL VISIT FOR:  Andrew Marshall  By participating in this virtual visit I agree to the following:  I hereby voluntarily request, consent and authorize [facility] and its employed or contracted physicians, physician assistants, nurse practitioners or other licensed health care professionals (the Practitioner), to provide me with telemedicine health care services (the "Services") as deemed necessary by the treating Practitioner. I acknowledge and consent to receive the Services by the Practitioner via telemedicine. I understand that the telemedicine visit will involve communicating with the Practitioner through live audiovisual communication technology and the disclosure of certain medical information by electronic transmission. I acknowledge that I have been given the opportunity to request an in-person assessment or other available alternative prior to the telemedicine visit and am voluntarily participating in the telemedicine visit.  I understand that I have the right to withhold or withdraw my consent to the use of telemedicine in the course of my care at any time, without affecting my right to future care or treatment, and that the Practitioner or I may terminate the telemedicine visit at any time. I understand that I have the right to inspect all information obtained and/or recorded in the course of the telemedicine visit and may receive copies of available information for a reasonable fee.  I understand that some of the potential risks of receiving the Services via telemedicine include:  Delay or interruption in medical evaluation due to technological equipment failure or disruption; Information transmitted may not be sufficient (e.g. poor  resolution of images) to allow for appropriate medical decision making by the Practitioner; and/or  In rare instances, security protocols could fail, causing a breach of personal health information.  Furthermore, I acknowledge that it is my responsibility to provide information about my medical history, conditions and care that is complete and accurate to the best of my ability. I acknowledge that Practitioner's advice, recommendations, and/or decision may be based on factors not within their control, such as incomplete or inaccurate data provided by me or distortions of diagnostic images or specimens that may result from electronic transmissions. I understand that the practice of medicine is not an exact science and that Practitioner makes no warranties or guarantees regarding treatment outcomes. I acknowledge that a copy of this consent can be made available to me via my patient portal ([facility]), or I can request a printed copy by calling the office of [facility].    I understand that my insurance will be billed for this visit.   I have read or had this consent read to me. I understand the contents of this consent, which adequately explains the benefits and risks of the Services being provided via telemedicine.  I have been provided ample opportunity to ask questions regarding this consent and the Services and have had my questions answered to my satisfaction. I give my informed consent for the services to be provided through the use of telemedicine in my medical care

## 2022-04-11 NOTE — Telephone Encounter (Signed)
1st attempt to reach pt regarding surgical clearance and the need for a tele visit.  Left a message for pt to call back and ask for the preop team. 

## 2022-04-17 ENCOUNTER — Other Ambulatory Visit: Payer: Self-pay

## 2022-04-17 MED ORDER — CLOPIDOGREL BISULFATE 75 MG PO TABS: 75.0000 mg | ORAL_TABLET | Freq: Every day | ORAL | 2 refills | Status: DC

## 2022-04-18 ENCOUNTER — Ambulatory Visit (INDEPENDENT_AMBULATORY_CARE_PROVIDER_SITE_OTHER): Payer: Medicare Other | Admitting: Physician Assistant

## 2022-04-18 DIAGNOSIS — Z01810 Encounter for preprocedural cardiovascular examination: Secondary | ICD-10-CM | POA: Diagnosis not present

## 2022-04-18 NOTE — Progress Notes (Signed)
Virtual Visit via Telephone Note   Because of Andrew Marshall's co-morbid illnesses, he is at least at moderate risk for complications without adequate follow up.  This format is felt to be most appropriate for this patient at this time.  The patient did not have access to video technology/had technical difficulties with video requiring transitioning to audio format only (telephone).  All issues noted in this document were discussed and addressed.  No physical exam could be performed with this format.  Please refer to the patient's chart for his consent to telehealth for [facility].  Evaluation Performed:  Preoperative cardiovascular risk assessment _____________   Date:  04/18/2022   Patient ID:  Andrew Marshall, DOB Sep 22, 1930, MRN [id number] Patient Location:  Home Provider location:   Office  Primary Care Provider:  Lujean Amel, MD Primary Cardiologist:  Candee Furbish, MD  Chief Complaint / Patient Profile   88 y.o. y/o male with a h/o CAD status post stent to proximal circumflex and OM 2, hypertension, hyperlipidemia, nonischemic cardiomyopathy with complete heart block EF previously 25% increased to 40% with biventricular upgrade and pacemaker who is pending hernia surgery and presents today for telephonic preoperative cardiovascular risk assessment.  Past Medical History    Past Medical History:  Diagnosis Date   Allergic rhinitis    Arthritis    "thumbs primarily" (05/05/2015)   Basal cell carcinoma, face    "have had several; cut and burned off"   CAD (coronary artery disease)    a. 2001 s/p post MI- multiple PCI's to LCX/OM;  b. 2003 DES to OM;  c. 05/2012 MV: postlat infarct w/ mild peri-infarct ischemia. nl EF->Med Rx.   Complete heart block ([place])    St. Jude pacemaker implanted 11/22/14   ED (erectile dysfunction)    Esophageal reflux    "slight"   Essential hypertension, benign    History of atrial fibrillation    History of GI diverticular bleed    a. 05/2013  and 02/2014 - taken off of ASA for this reason.   Metatarsalgia    MI (myocardial infarction) ([place]) 2001   Moderate mitral insufficiency    a. 10/2014 Echo: EF 55-60%, Gr1 DD, mild AS, mod MR, sev dil LA, nl RV, mildly dil RA, mild TR, PASP 63mHg.   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   Prostate cancer ([facility] 2008   S/P "radiation and seed implants"   Pure hypercholesterolemia    RLS (restless legs syndrome)    Past Surgical History:  Procedure Laterality Date   APPENDECTOMY  1956   BASAL CELL CARCINOMA EXCISION     "face"   BIV UPGRADE N/A 11/30/2019   Procedure: BIV PPM UPGRADE;  Surgeon: AThompson Grayer MD;  [facility];  Service: Cardiovascular;  Laterality: N/A;   CARDIAC CATHETERIZATION N/A 05/05/2015   Procedure: Left Heart Cath and Coronary Angiography;  Surgeon: HBelva Crome MD;  [facility];  Service: Cardiovascular;  Laterality: N/A;   CARDIAC CATHETERIZATION N/A 05/05/2015   Procedure: Coronary Stent Intervention;  Surgeon: HBelva Crome MD;  [facility];  Service: Cardiovascular;  Laterality: N/A;   CATARACT EXTRACTION W/ INTRAOCULAR LENS IMPLANT Left 12/2014   COLONOSCOPY W/ POLYPECTOMY     CORONARY ANGIOPLASTY     CORONARY ANGIOPLASTY WITH STENT PLACEMENT  04/2000; ? ~ 2002; 01/2002   ESTERNAL BEAM XRT AND  SEEDS FOR PROSTATE CANCER     EYE SURGERY Right 07/2016   FOOT ARTHRODESIS, TRIPLE Right 2007   GANGLION CYST EXCISION Right 1970's   wrist   INSERT / REPLACE / REMOVE PACEMAKER  2017   LEFT HEART CATH AND CORONARY ANGIOGRAPHY N/A 09/19/2017   Procedure: LEFT HEART CATH AND CORONARY ANGIOGRAPHY;  Surgeon: Burnell Blanks, MD;  Location: [facility];  Service: Cardiovascular;  Laterality: N/A;   LUMBAR LAMINECTOMY/DECOMPRESSION MICRODISCECTOMY Left 09/18/2016   Procedure: LEFT LUMBAR THREE- LUMBAR FOUR LUMBAR LAMINOTOMY AND  MICRODISCECTOMY;  Surgeon: Jovita Gamma, MD;  Location: [facility];  Service: Neurosurgery;  Laterality: Left;  LEFT LUMBAR 3- LUMBAR 4 LUMBAR LAMINOTOMY AND MICRODISCECTOMY   PERMANENT PACEMAKER INSERTION N/A 11/22/2014   Procedure: PERMANENT PACEMAKER INSERTION;  Surgeon: [name], MD;  Location: [facility] C[facility];  Service: Cardiovascular;  Laterality: N/A;   PROSTATE BIOPSY     TONSILLECTOMY  1942    Allergies  Allergies  Allergen Reactions   Ambien [Zolpidem] Other (See Comments)    Sleep walk    Erythromycin Nausea And Vomiting and Other (See Comments)    History of Present Illness    Andrew Marshall is a 88 y.o. male who presents via audio/video conferencing for a telehealth visit today.  Pt was last seen in cardiology clinic on 01/23/2022 by Estella Husk, PA-C.  At that time Andrew Marshall was doing well .  The patient is now pending procedure as outlined above. Since his last visit, he feels fine, rides his bike. 15 miles on the bike at a time.  He has not had any cardiac symptoms such as shortness of breath or chest pain.  He is very active walking 1-2 blocks and up and down stairs.  He is able to do all the inside housework and even though he does not have to could do yard work.  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  He is scored a 5.07 on the DASI.  This exceeds the 4 METS minimum requirement.  OK to hold plavix for 5 days prior to hernia surgery.  Restart one day post op if OK with surgery team.    Home Medications    Prior to Admission medications   Medication Sig Start Date End Date Taking? Authorizing Provider  atorvastatin (LIPITOR) 40 MG tablet Take 1 tablet (40 mg total) by mouth daily. 06/25/21   Jerline Pain, MD  Carboxymethylcellul-Glycerin (LUBRICATING EYE DROPS OP) Place 1 drop into both eyes daily as needed (dry eyes).    [provider]  carvedilol (COREG)  12.5 MG tablet Take 1 tablet (12.5 mg total) by mouth 2 (two) times daily with a meal. 09/25/21   Jerline Pain, MD  cetirizine (ZYRTEC) 10 MG tablet Take 10 mg by mouth daily.    [provider]  clopidogrel (PLAVIX) 75 MG tablet Take 1 tablet (75 mg total) by mouth daily. 04/17/22   Jerline Pain, MD  nitroGLYCERIN (NITROSTAT) 0.4 MG SL tablet Place 1 tablet (0.4 mg total) under the tongue every 5 (five) minutes as needed for chest pain (MAX 3 TABLETS). 03/07/16   Seiler, Amber K, NP  sacubitril-valsartan (ENTRESTO) 24-26 MG Take 1 tablet by mouth 2 (two) times daily. 06/13/21   Jerline Pain, MD    Physical Exam    Vital Signs:  Andrew Marshall  Andrew Marshall does not have vital signs available for review today.  Given telephonic nature of communication, physical exam is limited. AAOx3. NAD. Normal affect.  Speech and respirations are unlabored.  Accessory Clinical Findings    None  Assessment & Plan    1.  Preoperative Cardiovascular Risk Assessment:  Andrew Marshall perioperative risk of a major cardiac event is 6.6% according to the Revised Cardiac Risk Index (RCRI).  Therefore, he is at high risk for perioperative complications.   His functional capacity is good at 5.07 METs according to the [employer] Activity Status Index (DASI). Recommendations: According to ACC/AHA guidelines, no further cardiovascular testing needed.  The patient may proceed to surgery at acceptable risk.   Antiplatelet and/or Anticoagulation Recommendations: Clopidogrel (Plavix) can be held for 5 days prior to his surgery and resumed as soon as possible post op.  A copy of this note will be routed to requesting surgeon.  Time:   Today, I have spent 10 minutes with the patient with telehealth technology discussing medical history, symptoms, and management plan.     Elgie Collard, PA-C  04/18/2022, 1:18 PM

## 2022-04-23 DIAGNOSIS — G4721 Circadian rhythm sleep disorder, delayed sleep phase type: Secondary | ICD-10-CM | POA: Diagnosis not present

## 2022-04-24 ENCOUNTER — Ambulatory Visit: Payer: Self-pay | Admitting: General Surgery

## 2022-04-24 NOTE — H&P (Signed)
Chief Complaint: Recheck       History of Present Illness: Andrew Marshall is a 88 y.o. male who is seen today as an office consultation at the request of Dr. Pasty Arch for evaluation of Recheck .   Patient is an 88 year old male with a right inguinal hernia follows back up today.  Was previously seen in June 2022.   Patient has a history of pacemaker, A-fib- On Plavix patient does state that since his last visit hernia has gotten somewhat larger.  He states recently she had a back injury and has not cycled as much.  He has started back.  He would like to fix the hernia if possible prior to getting older.     Review of Systems: A complete review of systems was obtained from the patient.  I have reviewed this information and discussed as appropriate with the patient.  See HPI as well for other ROS.   Review of Systems  Constitutional:  Negative for fever.  HENT:  Negative for congestion.   Eyes:  Negative for blurred vision.  Respiratory:  Negative for cough, shortness of breath and wheezing.   Cardiovascular:  Negative for chest pain and palpitations.  Gastrointestinal:  Negative for heartburn.  Genitourinary:  Negative for dysuria.  Musculoskeletal:  Negative for myalgias.  Skin:  Negative for rash.  Neurological:  Negative for dizziness and headaches.  Psychiatric/Behavioral:  Negative for depression and suicidal ideas.   All other systems reviewed and are negative.      Medical History: Past Medical History Past Medical History: Diagnosis Date  Arthritis    CHF (congestive heart failure) (CMS-HCC)    History of cancer        There is no problem list on file for this patient.     Past Surgical History Past Surgical History: Procedure Laterality Date  LEFT LUMBAR THREE- LUMBAR FOUR LUMBAR LAMINOTOMY AND MICRODISCECTOMY (Left: Spine Lumbar)   09/18/2016  REPOSITIONING PREVIOUSLY IMPLANTED TRANSVENOUS PACEMAKER OR ICD ELECTRODE       2016 & 2021       Allergies Allergies Allergen Reactions  Erythromycin Nausea And Vomiting, Other (See Comments) and Abdominal Pain     GI upset and pains  Zolpidem Other (See Comments) and Unknown     Sleep walk      Current Outpatient Medications on File Prior to Visit Medication Sig Dispense Refill  atorvastatin (LIPITOR) 40 MG tablet 1 tablet      carvediloL (COREG) 12.5 MG tablet 1 tablet      clopidogreL (PLAVIX) 75 mg tablet 1 tablet      sacubitriL-valsartan (ENTRESTO) 24-26 mg tablet Take 1 tablet by mouth 2 (two) times daily       No current facility-administered medications on file prior to visit.     Family History Family History Problem Relation Age of Onset  Coronary Artery Disease (Blocked arteries around heart) Mother    Breast cancer Mother    Coronary Artery Disease (Blocked arteries around heart) Father    Diabetes Brother        Social History   Tobacco Use Smoking Status Former  Types: Cigarettes  Quit date: 2007  Years since quitting: 16.6 Smokeless Tobacco Never     Social History Social History    Socioeconomic History  Marital status: Married Tobacco Use  Smoking status: Former     Types: Cigarettes     Quit date: 2007     Years since quitting: 16.6  Smokeless tobacco: Never Substance and  Sexual Activity  Alcohol use: Yes     Alcohol/week: 2.0 standard drinks     Types: 2 Standard drinks or equivalent per week  Drug use: Never      Objective:     Vitals:   04/09/22 1102 BP: 102/60 Pulse: 83 Temp: 36.4 C (97.6 F) SpO2: 98% Weight: 68.7 kg (151 lb 6.4 oz) Height: 174 cm (5' 8.5")   Body mass index is 22.69 kg/m. Physical Exam Constitutional:      Appearance: Normal appearance.  HENT:     Head: Normocephalic and atraumatic.     Nose: Nose normal. No congestion.     Mouth/Throat:     Mouth: Mucous membranes are moist.     Pharynx: Oropharynx is clear.  Eyes:     Pupils: Pupils are equal, round, and reactive to light.   Cardiovascular:     Rate and Rhythm: Normal rate and regular rhythm.     Pulses: Normal pulses.     Heart sounds: Normal heart sounds. No murmur heard.   No friction rub. No gallop.  Pulmonary:     Effort: Pulmonary effort is normal. No respiratory distress.     Breath sounds: Normal breath sounds. No stridor. No wheezing, rhonchi or rales.  Abdominal:     General: Abdomen is flat.     Hernia: A hernia is present. Hernia is present in the right inguinal area.  Musculoskeletal:        General: Normal range of motion.     Cervical back: Normal range of motion.  Skin:    General: Skin is warm and dry.  Neurological:     General: No focal deficit present.     Mental Status: He is alert and oriented to person, place, and time.  Psychiatric:        Mood and Affect: Mood normal.        Thought Content: Thought content normal.        Assessment and Plan: Diagnoses and all orders for this visit:   Unilateral recurrent inguinal hernia without obstruction or gangrene     Andrew Marshall is a 88 y.o. male    WE will reach out to Dr. Gypsy Balsam for cardiac evaluation/stratification.  If he is deemed at moderate risk, can proceed with open vs lap surgery.  If at high risk will not proceed with surgery.  The pt is OK with this plan   1.  We will proceed to the OR for a open (sedation) right inguinal hernia repair with mesh. 2. All risks and benefits were discussed with the patient, to generally include infection, bleeding, damage to surrounding structures, acute and chronic nerve pain, and recurrence. Alternatives were offered and described.  All questions were answered and the patient voiced understanding of the procedure and wishes to proceed at this point.             No follow-ups on file.   Ralene Ok, MD, [facility], [facility]

## 2022-04-25 DIAGNOSIS — H906 Mixed conductive and sensorineural hearing loss, bilateral: Secondary | ICD-10-CM | POA: Diagnosis not present

## 2022-04-25 DIAGNOSIS — H9211 Otorrhea, right ear: Secondary | ICD-10-CM | POA: Diagnosis not present

## 2022-04-25 DIAGNOSIS — H7293 Unspecified perforation of tympanic membrane, bilateral: Secondary | ICD-10-CM | POA: Diagnosis not present

## 2022-04-30 ENCOUNTER — Telehealth: Payer: Self-pay | Admitting: Physician Assistant

## 2022-04-30 ENCOUNTER — Other Ambulatory Visit: Payer: Self-pay

## 2022-04-30 ENCOUNTER — Ambulatory Visit (INDEPENDENT_AMBULATORY_CARE_PROVIDER_SITE_OTHER): Payer: Medicare Other

## 2022-04-30 DIAGNOSIS — I5022 Chronic systolic (congestive) heart failure: Secondary | ICD-10-CM

## 2022-04-30 DIAGNOSIS — Z950 Presence of cardiac pacemaker: Secondary | ICD-10-CM | POA: Diagnosis not present

## 2022-04-30 MED ORDER — ENTRESTO 49-51 MG PO TABS
1.0000 | ORAL_TABLET | Freq: Two times a day (BID) | ORAL | 6 refills | Status: DC
Start: 2022-04-30 — End: 2022-07-23

## 2022-04-30 NOTE — Telephone Encounter (Signed)
**Note De-Identified Andrew Marshall Obfuscation** The pt states that he does not feel that he will qualify for assistance for Entresto through [employer] because he is not "destitute" and his income will be greater than their limit.  He did write down their phone number and stated that he would call them.

## 2022-04-30 NOTE — Telephone Encounter (Signed)
See phone note

## 2022-05-01 NOTE — Progress Notes (Signed)
EPIC Encounter for ICM Monitoring  Patient Name: Andrew Marshall is a 87 y.o. male Date: 05/01/2022 Primary Care Physican: Lujean Amel, MD Primary Cardiologist: Marlou Porch Electrophysiologist: Marisa Sprinkles Pacing: 97%            05/01/2022 Home Weight: 141 lbs                                                            Spoke with patient and heart failure questions reviewed.  Pt asymptomatic for fluid accumulation.  Reports feeling well at this time and voices no complaints.    CorVue thoracic impedance trending close to baseline normal after holiday weekend.   Prescribed: No diuretic   Recommendations: No changes and encouraged to call if experiencing any fluid symptoms.   Follow-up plan: ICM clinic phone appointment on 06/10/2022.  91 day device clinic remote transmission 05/31/2022.     EP/Cardiology Office Visits:  Recall 10/13/2022 with Dr Quentin Ore.  07/23/2022 with Dr Marlou Porch.     Copy of ICM check sent to Dr Quentin Ore (replacing Dr. [name]).  3 month ICM trend: 04/30/2022.    12-14 Month ICM trend:     Rosalene Billings, RN 05/01/2022 3:38 PM

## 2022-05-14 DIAGNOSIS — G4721 Circadian rhythm sleep disorder, delayed sleep phase type: Secondary | ICD-10-CM | POA: Diagnosis not present

## 2022-05-22 DIAGNOSIS — R197 Diarrhea, unspecified: Secondary | ICD-10-CM | POA: Diagnosis not present

## 2022-05-23 DIAGNOSIS — R197 Diarrhea, unspecified: Secondary | ICD-10-CM | POA: Diagnosis not present

## 2022-05-29 LAB — CUP PACEART REMOTE DEVICE CHECK
Battery Remaining Longevity: 47 mo
Battery Remaining Percentage: 61 %
Battery Voltage: 2.98 V
Brady Statistic AP VP Percent: 94 %
Brady Statistic AP VS Percent: 1 %
Brady Statistic AS VP Percent: 2.9 %
Brady Statistic AS VS Percent: 1 %
Brady Statistic RA Percent Paced: 92 %
Date Time Interrogation Session: 20231004040143
Implantable Lead Implant Date: 20160329
Implantable Lead Implant Date: 20160329
Implantable Lead Implant Date: 20210406
Implantable Lead Location: 753858
Implantable Lead Location: 753859
Implantable Lead Location: 753860
Implantable Lead Model: 1948
Implantable Pulse Generator Implant Date: 20210406
Lead Channel Impedance Value: 360 Ohm
Lead Channel Impedance Value: 600 Ohm
Lead Channel Impedance Value: 690 Ohm
Lead Channel Pacing Threshold Amplitude: 0.5 V
Lead Channel Pacing Threshold Amplitude: 0.875 V
Lead Channel Pacing Threshold Amplitude: 2.625 V
Lead Channel Pacing Threshold Pulse Width: 0.5 ms
Lead Channel Pacing Threshold Pulse Width: 0.5 ms
Lead Channel Pacing Threshold Pulse Width: 0.5 ms
Lead Channel Sensing Intrinsic Amplitude: 12 mV
Lead Channel Sensing Intrinsic Amplitude: 3 mV
Lead Channel Setting Pacing Amplitude: 1.5 V
Lead Channel Setting Pacing Amplitude: 2 V
Lead Channel Setting Pacing Amplitude: 3.125
Lead Channel Setting Pacing Pulse Width: 0.5 ms
Lead Channel Setting Pacing Pulse Width: 0.5 ms
Lead Channel Setting Sensing Sensitivity: 4 mV
Pulse Gen Model: 3562
Pulse Gen Serial Number: 6058048

## 2022-05-31 ENCOUNTER — Ambulatory Visit: Payer: Medicare Other

## 2022-05-31 ENCOUNTER — Ambulatory Visit: Payer: Self-pay | Admitting: General Surgery

## 2022-05-31 DIAGNOSIS — K4091 Unilateral inguinal hernia, without obstruction or gangrene, recurrent: Secondary | ICD-10-CM | POA: Diagnosis not present

## 2022-06-07 DIAGNOSIS — M79675 Pain in left toe(s): Secondary | ICD-10-CM | POA: Diagnosis not present

## 2022-06-10 ENCOUNTER — Ambulatory Visit (INDEPENDENT_AMBULATORY_CARE_PROVIDER_SITE_OTHER): Payer: Medicare Other

## 2022-06-10 DIAGNOSIS — Z950 Presence of cardiac pacemaker: Secondary | ICD-10-CM | POA: Diagnosis not present

## 2022-06-10 DIAGNOSIS — I5022 Chronic systolic (congestive) heart failure: Secondary | ICD-10-CM | POA: Diagnosis not present

## 2022-06-12 ENCOUNTER — Telehealth: Payer: Self-pay

## 2022-06-12 NOTE — Telephone Encounter (Signed)
Remote ICM transmission received.  Attempted call to patient regarding ICM remote transmission and left detailed message per DPR.  Advised to return call for any fluid symptoms or questions. Next ICM remote transmission scheduled 07/15/2022.

## 2022-06-12 NOTE — Progress Notes (Signed)
EPIC Encounter for ICM Monitoring  Patient Name: Andrew Marshall is a 88 y.o. male Date: 06/12/2022 Primary Care Physican: Lujean Amel, MD Primary Cardiologist: Marlou Porch Electrophysiologist: Marisa Sprinkles Pacing: 97%            05/01/2022 Home Weight: 141 lbs                                                            Attempted call to patient and unable to reach.  Left detailed message per DPR regarding transmission. Transmission reviewed.    CorVue thoracic impedance normal but was suggesting possible fluid accumulation from 9/29-10/8.   Prescribed: No diuretic   Recommendations: Left voice mail with ICM number and encouraged to call if experiencing any fluid symptoms.   Follow-up plan: ICM clinic phone appointment on 07/15/2022.  91 day device clinic remote transmission 08/30/2022.     EP/Cardiology Office Visits:  Recall 10/13/2022 with Dr Quentin Ore.  07/23/2022 with Dr Marlou Porch.     Copy of ICM check sent to Dr Quentin Ore.  3 month ICM trend: 06/10/2022.    12-14 Month ICM trend:     Rosalene Billings, RN 06/12/2022 1:49 PM

## 2022-06-21 DIAGNOSIS — R5383 Other fatigue: Secondary | ICD-10-CM | POA: Diagnosis not present

## 2022-06-24 ENCOUNTER — Other Ambulatory Visit (HOSPITAL_COMMUNITY): Payer: Medicare Other

## 2022-06-27 DIAGNOSIS — D649 Anemia, unspecified: Secondary | ICD-10-CM | POA: Diagnosis not present

## 2022-07-02 ENCOUNTER — Ambulatory Visit: Admit: 2022-07-02 | Payer: Medicare Other | Admitting: General Surgery

## 2022-07-02 SURGERY — REPAIR, HERNIA, INGUINAL, LAPAROSCOPIC
Anesthesia: General | Laterality: Right

## 2022-07-15 ENCOUNTER — Ambulatory Visit (INDEPENDENT_AMBULATORY_CARE_PROVIDER_SITE_OTHER): Payer: Medicare Other

## 2022-07-15 DIAGNOSIS — I5022 Chronic systolic (congestive) heart failure: Secondary | ICD-10-CM | POA: Diagnosis not present

## 2022-07-15 DIAGNOSIS — Z950 Presence of cardiac pacemaker: Secondary | ICD-10-CM | POA: Diagnosis not present

## 2022-07-17 ENCOUNTER — Telehealth: Payer: Self-pay

## 2022-07-17 NOTE — Telephone Encounter (Signed)
Remote ICM transmission received.  Attempted call to patient regarding ICM remote transmission and left detailed message per DPR.  Advised to return call for any fluid symptoms or questions. Next ICM remote transmission scheduled 08/20/2022.    

## 2022-07-17 NOTE — Progress Notes (Signed)
EPIC Encounter for ICM Monitoring  Patient Name: Andrew Marshall is a 88 y.o. male Date: 07/17/2022 Primary Care Physican: Lujean Amel, MD Primary Cardiologist: Marlou Porch Electrophysiologist: Marisa Sprinkles Pacing: 97%            05/01/2022 Home Weight: 141 lbs                                                            Attempted call to patient and unable to reach.  Left detailed message per DPR regarding transmission. Transmission reviewed.    CorVue thoracic impedance suggesting normal fluid levels.   Prescribed: No diuretic   Recommendations:  Left voice mail with ICM number and encouraged to call if experiencing any fluid symptoms.   Follow-up plan: ICM clinic phone appointment on 08/20/2022.  91 day device clinic remote transmission 08/30/2022.     EP/Cardiology Office Visits:  Recall 10/13/2022 with Dr Quentin Ore.  07/23/2022 with Dr Marlou Porch.     Copy of ICM check sent to Dr Quentin Ore.   3 month ICM trend: 07/15/2022.    12-14 Month ICM trend:     Rosalene Billings, RN 07/17/2022 8:41 AM

## 2022-07-23 ENCOUNTER — Ambulatory Visit: Payer: Medicare Other | Attending: Cardiology | Admitting: Cardiology

## 2022-07-23 ENCOUNTER — Other Ambulatory Visit: Payer: Self-pay

## 2022-07-23 VITALS — BP 112/70 | HR 67 | Ht 69.0 in | Wt 151.0 lb

## 2022-07-23 DIAGNOSIS — I5022 Chronic systolic (congestive) heart failure: Secondary | ICD-10-CM

## 2022-07-23 DIAGNOSIS — I340 Nonrheumatic mitral (valve) insufficiency: Secondary | ICD-10-CM

## 2022-07-23 DIAGNOSIS — Z950 Presence of cardiac pacemaker: Secondary | ICD-10-CM | POA: Diagnosis not present

## 2022-07-23 MED ORDER — ATORVASTATIN CALCIUM 40 MG PO TABS: 40.0000 mg | ORAL_TABLET | Freq: Every day | ORAL | 3 refills | Status: DC
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
## 2022-07-23 NOTE — Patient Instructions (Signed)
Medication Instructions:  Please decrease Entresto 24-26 mg to 1/2 tablet twice a day. Please contact the office if you are not able to split the tablets. Continue all other medications as listed.  *If you need a refill on your cardiac medications before your next appointment, please call your pharmacy*  Follow-Up: At [facility], you and your health needs are our priority.  As part of our continuing mission to provide you with exceptional heart care, we have created designated Provider Care Teams.  These Care Teams include your primary Cardiologist (physician) and Advanced Practice Providers (APPs -  Physician Assistants and Nurse Practitioners) who all work together to provide you with the care you need, when you need it.  We recommend signing up for the patient portal called "MyChart".  Sign up information is provided on this After Visit Summary.  MyChart is used to connect with patients for Virtual Visits (Telemedicine).  Patients are able to view lab/test results, encounter notes, upcoming appointments, etc.  Non-urgent messages can be sent to your provider as well.   To learn more about what you can do with MyChart, go to [email or web address].    Your next appointment:   6 month(s)  The format for your next appointment:   In Person  Provider:   Candee Furbish, MD      Important Information About [place]

## 2022-08-20 ENCOUNTER — Ambulatory Visit (INDEPENDENT_AMBULATORY_CARE_PROVIDER_SITE_OTHER): Payer: Medicare Other

## 2022-08-20 DIAGNOSIS — Z950 Presence of cardiac pacemaker: Secondary | ICD-10-CM

## 2022-08-20 DIAGNOSIS — I5022 Chronic systolic (congestive) heart failure: Secondary | ICD-10-CM | POA: Diagnosis not present

## 2022-08-21 NOTE — Progress Notes (Signed)
EPIC Encounter for ICM Monitoring  Patient Name: Andrew Marshall is a 88 y.o. male Date: 08/21/2022 Primary Care Physican: Lujean Amel, MD Primary Cardiologist: Marlou Porch Electrophysiologist: Marisa Sprinkles Pacing: 98%            05/01/2022 Home Weight: 141 lbs                                                            Spoke with patient and heart failure questions reviewed.  Transmission results reviewed.  Pt asymptomatic for fluid accumulation.  Reports feeling well at this time and voices no complaints.     CorVue thoracic impedance suggesting normal fluid levels with intermittent days with possible fluid accumulation within last month.   Prescribed: No diuretic   Recommendations:  No changes and encouraged to call if experiencing any fluid symptoms.   Follow-up plan: ICM clinic phone appointment on 09/23/2022.  91 day device clinic remote transmission 08/30/2022.     EP/Cardiology Office Visits:  Recall 10/13/2022 with Dr Quentin Ore.  Recall 01/19/2023 with Dr Marlou Porch.     Copy of ICM check sent to Dr Quentin Ore.    3 month ICM trend: 08/20/2022.    12-14 Month ICM trend:     Rosalene Billings, RN 08/21/2022 7:40 AM

## 2022-08-30 ENCOUNTER — Ambulatory Visit (INDEPENDENT_AMBULATORY_CARE_PROVIDER_SITE_OTHER): Payer: Medicare Other

## 2022-08-30 DIAGNOSIS — I255 Ischemic cardiomyopathy: Secondary | ICD-10-CM

## 2022-09-02 ENCOUNTER — Other Ambulatory Visit: Payer: Self-pay | Admitting: *Deleted

## 2022-09-02 MED ORDER — ENTRESTO 24-26 MG PO TABS: 0.5000 | ORAL_TABLET | Freq: Two times a day (BID) | ORAL | 11 refills | Status: DC

## 2022-09-06 LAB — CUP PACEART REMOTE DEVICE CHECK
Battery Remaining Longevity: 44 mo
Battery Remaining Percentage: 57 %
Battery Voltage: 2.96 V
Brady Statistic AP VP Percent: 95 %
Brady Statistic AP VS Percent: 1 %
Brady Statistic AS VP Percent: 2.6 %
Brady Statistic AS VS Percent: 1 %
Brady Statistic RA Percent Paced: 94 %
Date Time Interrogation Session: 20240102034107
Implantable Lead Connection Status: 753985
Implantable Lead Connection Status: 753985
Implantable Lead Connection Status: 753985
Implantable Lead Implant Date: 20160329
Implantable Lead Implant Date: 20160329
Implantable Lead Implant Date: 20210406
Implantable Lead Location: 753858
Implantable Lead Location: 753859
Implantable Lead Location: 753860
Implantable Lead Model: 1948
Implantable Pulse Generator Implant Date: 20210406
Lead Channel Impedance Value: 360 Ohm
Lead Channel Impedance Value: 600 Ohm
Lead Channel Impedance Value: 650 Ohm
Lead Channel Pacing Threshold Amplitude: 0.5 V
Lead Channel Pacing Threshold Amplitude: 0.75 V
Lead Channel Pacing Threshold Amplitude: 2 V
Lead Channel Pacing Threshold Pulse Width: 0.5 ms
Lead Channel Pacing Threshold Pulse Width: 0.5 ms
Lead Channel Pacing Threshold Pulse Width: 0.5 ms
Lead Channel Sensing Intrinsic Amplitude: 12 mV
Lead Channel Sensing Intrinsic Amplitude: 2.7 mV
Lead Channel Setting Pacing Amplitude: 1.5 V
Lead Channel Setting Pacing Amplitude: 2 V
Lead Channel Setting Pacing Amplitude: 2.5 V
Lead Channel Setting Pacing Pulse Width: 0.5 ms
Lead Channel Setting Pacing Pulse Width: 0.5 ms
Lead Channel Setting Sensing Sensitivity: 4 mV
Pulse Gen Model: 3562
Pulse Gen Serial Number: 6058048

## 2022-09-16 ENCOUNTER — Encounter (HOSPITAL_COMMUNITY): Payer: Self-pay | Admitting: General Surgery

## 2022-09-16 ENCOUNTER — Ambulatory Visit: Payer: Self-pay | Admitting: General Surgery

## 2022-09-16 ENCOUNTER — Other Ambulatory Visit: Payer: Self-pay

## 2022-09-16 ENCOUNTER — Encounter: Payer: Self-pay | Admitting: Cardiology

## 2022-09-16 DIAGNOSIS — L309 Dermatitis, unspecified: Secondary | ICD-10-CM | POA: Diagnosis not present

## 2022-09-16 DIAGNOSIS — L280 Lichen simplex chronicus: Secondary | ICD-10-CM | POA: Diagnosis not present

## 2022-09-16 NOTE — Anesthesia Preprocedure Evaluation (Addendum)
Anesthesia Evaluation  Patient identified by MRN, date of birth, ID band Patient awake    Reviewed: Allergy & Precautions, NPO status , Patient's Chart, lab work & pertinent test results  Airway Mallampati: III  TM Distance: >3 FB Neck ROM: Full    Dental   Pulmonary former smoker   breath sounds clear to auscultation       Cardiovascular hypertension, Pt. on medications + CAD, + Past MI and + Cardiac Stents  + dysrhythmias (CHB) + pacemaker + Valvular Problems/Murmurs AS and MR  Rhythm:Regular Rate:Normal  Echo 02/13/22: IMPRESSIONS   1. Left ventricular ejection fraction, by estimation, is 35 to 40%. The  left ventricle has moderately decreased function. The left ventricle  demonstrates regional wall motion abnormalities (see scoring  diagram/findings for description). There is  moderate eccentric left ventricular hypertrophy of the septal segment.  Left ventricular diastolic parameters are consistent with Grade II  diastolic dysfunction (pseudonormalization). Elevated left ventricular  end-diastolic pressure. There is moderate  hypokinesis of the left ventricular, basal-mid inferior wall. There is  akinesis of the left ventricular, basal-mid anterolateral wall and  inferolateral wall.   2. Right ventricular systolic function is normal. The right ventricular  size is mildly enlarged. There is normal pulmonary artery systolic  pressure. The estimated right ventricular systolic pressure is 89.3 mmHg.   3. Left atrial size was severely dilated.   4. Right atrial size was moderately dilated.   5. A small pericardial effusion is present. The pericardial effusion is  circumferential.   6. The mitral valve is abnormal. Moderate mitral valve regurgitation. No  evidence of mitral stenosis. Moderate mitral annular calcification.   7. The aortic valve is tricuspid. There is moderate calcification of the  aortic valve. There is moderate  thickening of the aortic valve. Aortic  valve regurgitation is not visualized. Mild aortic valve stenosis.   8. The inferior vena cava is normal in size with greater than 50%  respiratory variability, suggesting right atrial pressure of 3 mmHg.  - Comparison(s): No significant change from prior study.     Neuro/Psych negative neurological ROS     GI/Hepatic Neg liver ROS,GERD  ,,  Endo/Other  negative endocrine ROS    Renal/GU negative Renal ROS     Musculoskeletal  (+) Arthritis ,    Abdominal   Peds  Hematology  (+) Blood dyscrasia, anemia   Anesthesia Other Findings   Reproductive/Obstetrics                              Lab Results  Component Value Date   WBC 9.4 09/17/2022   HGB 12.2 (L) 09/17/2022   HCT 36.9 (L) 09/17/2022   MCV 95.8 09/17/2022   PLT 157 09/17/2022   Lab Results  Component Value Date   CREATININE 1.04 01/23/2022   BUN 22 01/23/2022   NA 139 01/23/2022   K 4.8 01/23/2022   CL 103 01/23/2022   CO2 25 01/23/2022     Anesthesia Physical Anesthesia Plan  ASA: 4  Anesthesia Plan: General   Post-op Pain Management: Tylenol PO (pre-op)*   Induction: Intravenous  PONV Risk Score and Plan: 2 and Dexamethasone, Ondansetron and Treatment may vary due to age or medical condition  Airway Management Planned: Oral ETT  Additional Equipment:   Intra-op Plan:   Post-operative Plan: Extubation in OR  Informed Consent: I have reviewed the patients History and Physical, chart, labs and discussed the procedure  including the risks, benefits and alternatives for the proposed anesthesia with the patient or authorized representative who has indicated his/her understanding and acceptance.     Dental advisory given  Plan Discussed with: CRNA  Anesthesia Plan Comments: (  )        Anesthesia Quick Evaluation

## 2022-09-16 NOTE — Progress Notes (Signed)
PCP - Dr. Lauretta Grill Koirala  Cardiologist - Dr. Candee Furbish & Dr.James Allred   PPM/ICD - St Jude Pacemaker Device Orders - in notes in EPIC Rep Notified - Jordy from [place] [facility] aware of pt's surgery tomorrow. Will be onsite tomorrow. Call if you need her.   EKG - 10/18/21 ECHO - 02/13/22 Cardiac Cath - 2019  Blood Thinner Instructions: Plavix last dose 09/13/21. Office aware per pt  ERAS Protcol - Clears until 1000  Anesthesia review: Y  Patient verbally denies any shortness of breath, fever, cough and chest pain during phone call   -------------  SDW INSTRUCTIONS given:  Your procedure is scheduled on 09/17/22.  Report to [facility] Main Entrance "A" at 1030 A.M., and check in at the Admitting office.  Call this number if you have problems the morning of surgery:  [phone]   Remember:  Do not eat after midnight the night before your surgery  You may drink clear liquids until 1000 the morning of your surgery.   Clear liquids allowed are: Water, Non-Citrus Juices (without pulp), Carbonated Beverages, Clear Tea, Black Coffee Only, and Gatorade    Take these medicines the morning of surgery with A SIP OF WATER  atorvastatin (LIPITOR)  carvedilol (COREG)  cetirizine (ZYRTEC)  Carboxymethylcellul-Glycerin (LUBRICATING EYE DROPS OP)-if needed  As of today, STOP taking any Aspirin (unless otherwise instructed by your surgeon) Aleve, Naproxen, Ibuprofen, Motrin, Advil, Goody's, BC's, all herbal medications, fish oil, and all vitamins.                      Do not wear jewelry, make up, or nail polish            Do not wear lotions, powders, perfumes/colognes, or deodorant.            Do not shave 48 hours prior to surgery.  Men may shave face and neck.            Do not bring valuables to the hospital.            [facility] is not responsible for any belongings or valuables.  Do NOT Smoke (Tobacco/Vaping) 24 hours prior to your procedure If you use a CPAP at night, you  may bring all equipment for your overnight stay.   Contacts, glasses, dentures or bridgework may not be worn into surgery.      For patients admitted to the hospital, discharge time will be determined by your treatment team.   Patients discharged the day of surgery will not be allowed to drive home, and someone needs to stay with them for 24 hours.    Special instructions:   [place]- Preparing For Surgery  Before surgery, you can play an important role. Because skin is not sterile, your skin needs to be as free of germs as possible. You can reduce the number of germs on your skin by washing with CHG (chlorahexidine gluconate) Soap before surgery.  CHG is an antiseptic cleaner which kills germs and bonds with the skin to continue killing germs even after washing.    Oral Hygiene is also important to reduce your risk of infection.  Remember - BRUSH YOUR TEETH THE MORNING OF SURGERY WITH YOUR REGULAR TOOTHPASTE  Please do not use if you have an allergy to CHG or antibacterial soaps. If your skin becomes reddened/irritated stop using the CHG.  Do not shave (including legs and underarms) for at least 48 hours prior to first CHG  shower. It is OK to shave your face.  Please follow these instructions carefully.   Shower the NIGHT BEFORE SURGERY and the MORNING OF SURGERY with DIAL Soap.   Pat yourself dry with a CLEAN TOWEL.  Wear CLEAN PAJAMAS to bed the night before surgery  Place CLEAN SHEETS on your bed the night of your first shower and DO NOT SLEEP WITH PETS.   Day of Surgery: Please shower morning of surgery  Wear Clean/Comfortable clothing the morning of surgery Do not apply any deodorants/lotions.   Remember to brush your teeth WITH YOUR REGULAR TOOTHPASTE.   Questions were answered. Patient verbalized understanding of instructions.

## 2022-09-16 NOTE — Progress Notes (Signed)
PERIOPERATIVE PRESCRIPTION FOR IMPLANTED CARDIAC DEVICE PROGRAMMING  Patient Information: Name:  Andrew Marshall  DOB:  1930/11/14  MRN:  [id number]   Planned Procedure:  Laparoscopic Right Inguinal hernia repair with mesh  Surgeon:  Dr. Rosendo Gros  Date of Procedure:  09/17/2022  Cautery will be used.  Position during surgery:  supine   Device Information:  Clinic EP Physician:  Formerly Dr. Tinnie Gens be establishing with Dr. Quentin Ore  Device Type:  Pacemaker Manufacturer and Phone #:  St. Jude/Abbott: [phone] Pacemaker Dependent?:  Yes.   Date of Last Device Check:  08/27/2022 Normal Device Function?:  Yes.    Electrophysiologist's Recommendations:  Have magnet available. Provide continuous ECG monitoring when magnet is used or reprogramming is to be performed.  Procedure may interfere with device function.  Magnet should be placed over device during procedure.  Per Device Clinic Standing Orders, Damian Leavell, RN  10:35 AM 09/16/2022

## 2022-09-16 NOTE — Progress Notes (Signed)
Anesthesia Chart Review: Andrew Marshall  Case: [id number] Date/Time: 09/17/22 1245   Procedure: LAPAROSCOPIC RIGHT INGUINAL HERNIA REPAIR WITH MESH (Right)   Anesthesia type: General   Pre-op diagnosis: RIGHT INGUINAL HERNIA   Location: [place] OR ROOM 02 / [place] OR   Surgeons: Ralene Ok, MD       DISCUSSION: Patient is an 89 year old male scheduled for the above procedure.  History includes former smoker (quit 05/22/00), HTN, CAD (posterior MI, s/p PTCA/stent LCX 05/22/00 & 12/11/00, PTCA/brachytherapy LCX 07/09/01; PTCA/DES proximal CX 05/05/15), HFrEF, CHB (s/p St. Jude PPM 11/22/14; upgrade to BiV 11/30/19), mitral regurgitation (moderate MR 01/2022 echo, also with mild AS), afib, hypercholesterolemia, GERD, prostate cancer, skin cancer (BCC, SCC), spinal surgery (Left L3-4 microdiscectomy 09/18/16)   Last cardiology visit with Dr. Marlou Porch was on 07/23/22. Entresto dose decreased due to hypotension, especially during a bike ride. PPM functioning OK. Last LHC in 2019 showed patent stents. Six month follow-up planned. (He had previous preoperative risk assessment on 04/18/22 by Nicholes Rough, PA-C: "Mr. Kirtley's perioperative risk of a major cardiac event is 6.6% according to the Revised Cardiac Risk Index (RCRI).  Therefore, he is at high risk for perioperative complications.   His functional capacity is good at 5.07 METs according to the Duke Activity Status Index (DASI). Recommendations: According to ACC/AHA guidelines, no further cardiovascular testing needed.  The patient may proceed to surgery at acceptable risk.   Antiplatelet and/or Anticoagulation Recommendations: Clopidogrel (Plavix) can be held for 5 days prior to his surgery and resumed as soon as possible post op."   EP Perioperative PPM recommendations: Device Information: Clinic EP Physician:  Formerly Dr. Tinnie Gens be establishing with Dr. Quentin Ore   Device Type:  Pacemaker Manufacturer and Phone #:  St. Jude/Abbott:  [phone] Pacemaker Dependent?:  Yes.   Date of Last Device Check:  08/27/2022         Normal Device Function?:  Yes.     Electrophysiologist's Recommendations: Have magnet available. Provide continuous ECG monitoring when magnet is used or reprogramming is to be performed.  Procedure may interfere with device function.  Magnet should be placed over device during procedure.   To hold Plavix for 5 days prior to surgery last dose reported as 09/13/21--Patient reported that he had notified surgeon's office.  Anesthesia team to evaluate on the day of surgery.   VS: Ht '5\' 8"'$  (1.727 m)   Wt 63.5 kg   BMI 21.29 kg/m  BP Readings from Last 3 Encounters:  07/23/22 112/70  01/23/22 (!) 102/58  10/18/21 112/68   Pulse Readings from Last 3 Encounters:  07/23/22 67  01/23/22 78  10/18/21 68     PROVIDERS: Koirala, Dibas, MD is PCP  Candee Furbish, MD is primary cardiologist Lars Mage, MD is EP cardiologist, previously [name], MD   LABS: For day of surgery as indicated. As of 01/23/22, Cr 1.04, H/H 11.9/34.3.   EKG: 10/20/21: AV dual paced rhythm with prolonged AV conduction.  Biventricular pacemaker detected.   CV: Echo 02/13/22: IMPRESSIONS   1. Left ventricular ejection fraction, by estimation, is 35 to 40%. The  left ventricle has moderately decreased function. The left ventricle  demonstrates regional wall motion abnormalities (see scoring  diagram/findings for description). There is  moderate eccentric left ventricular hypertrophy of the septal segment.  Left ventricular diastolic parameters are consistent with Grade II  diastolic dysfunction (pseudonormalization). Elevated left ventricular  end-diastolic pressure. There is moderate  hypokinesis of the left ventricular, basal-mid inferior wall. There  is  akinesis of the left ventricular, basal-mid anterolateral wall and  inferolateral wall.   2. Right ventricular systolic function is normal. The right  ventricular  size is mildly enlarged. There is normal pulmonary artery systolic  pressure. The estimated right ventricular systolic pressure is 38.4 mmHg.   3. Left atrial size was severely dilated.   4. Right atrial size was moderately dilated.   5. A small pericardial effusion is present. The pericardial effusion is  circumferential.   6. The mitral valve is abnormal. Moderate mitral valve regurgitation. No  evidence of mitral stenosis. Moderate mitral annular calcification.   7. The aortic valve is tricuspid. There is moderate calcification of the  aortic valve. There is moderate thickening of the aortic valve. Aortic  valve regurgitation is not visualized. Mild aortic valve stenosis.   8. The inferior vena cava is normal in size with greater than 50%  respiratory variability, suggesting right atrial pressure of 3 mmHg.  - Comparison(s): No significant change from prior study.    Cardiac cath 09/19/17: Prox RCA to Mid RCA lesion is 30% stenosed. Prox Cx to Mid Cx lesion is 20% stenosed. Ost 2nd Mrg lesion is 70% stenosed. Prox LAD lesion is 20% stenosed.   1. Single vessel CAD 2. Patent stents proximal and mid Circumflex with mild restenosis.  3. The small caliber obtuse marginal branch is jailed by the Circumflex stent. The ostium of the obtuse marginal branch has a 70-80% stenosis which is unchanged from 2016 at the time of deployment of the circumflex stent.  4. Mild non-obstructive disease in the large, dominant RCA and in the LAD which reaches the apex.  5. Mild elevation LV filling pressures - Recommendations: Continue medical management of CAD. Consider low dose lasix given elevated filling pressures.     Carotid duplex 12/07/15: 1-39% bilateral ICA stenosis.  Normal subclavian arteries bilaterally.  Patent vertebral arteries with antegrade flow.     Past Medical History:  Diagnosis Date   Allergic rhinitis    Arthritis    "thumbs primarily" (05/05/2015)   Basal cell  carcinoma, face    "have had several; cut and burned off"   CAD (coronary artery disease)    a. 2001 s/p post MI- multiple PCI's to LCX/OM;  b. 2003 DES to OM;  c. 05/2012 MV: postlat infarct w/ mild peri-infarct ischemia. nl EF->Med Rx.   Complete heart block ([place])    St. Jude pacemaker implanted 11/22/14   ED (erectile dysfunction)    Esophageal reflux    "slight"   Essential hypertension, benign    History of atrial fibrillation    History of GI diverticular bleed    a. 05/2013 and 02/2014 - taken off of ASA for this reason.   Metatarsalgia    MI (myocardial infarction) ([place]) 2001   Moderate mitral insufficiency    a. 10/2014 Echo: EF 55-60%, Gr1 DD, mild AS, mod MR, sev dil LA, nl RV, mildly dil RA, mild TR, PASP 66mHg.   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   Prostate cancer ([place] 2008   S/P "radiation and seed implants"   Pure hypercholesterolemia    RLS (restless legs syndrome)     Past Surgical History:  Procedure Laterality Date   APPENDECTOMY  1956   BASAL CELL CARCINOMA EXCISION     "face"   BIV UPGRADE N/A 11/30/2019   Procedure: BIV PPM UPGRADE;  Surgeon:  [name], MD;  Location: [place] CV LAB;  Service: Cardiovascular;  Laterality: N/A;   CARDIAC CATHETERIZATION N/A 05/05/2015   Procedure: Left Heart Cath and Coronary Angiography;  Surgeon: Belva Crome, MD;  Location: [place] CV LAB;  Service: Cardiovascular;  Laterality: N/A;   CARDIAC CATHETERIZATION N/A 05/05/2015   Procedure: Coronary Stent Intervention;  Surgeon: Belva Crome, MD;  Location: [place] CV LAB;  Service: Cardiovascular;  Laterality: N/A;   CATARACT EXTRACTION W/ INTRAOCULAR LENS IMPLANT Left 12/2014   COLONOSCOPY W/ POLYPECTOMY     CORONARY ANGIOPLASTY     CORONARY ANGIOPLASTY WITH STENT PLACEMENT  04/2000; ? ~ 2002; 01/2002   ESTERNAL BEAM XRT AND SEEDS FOR PROSTATE CANCER     EYE SURGERY Right 07/2016    FOOT ARTHRODESIS, TRIPLE Right 2007   GANGLION CYST EXCISION Right 1970's   wrist   INSERT / REPLACE / REMOVE PACEMAKER  2017   LEFT HEART CATH AND CORONARY ANGIOGRAPHY N/A 09/19/2017   Procedure: LEFT HEART CATH AND CORONARY ANGIOGRAPHY;  Surgeon: Burnell Blanks, MD;  Location: [place] CV LAB;  Service: Cardiovascular;  Laterality: N/A;   LUMBAR LAMINECTOMY/DECOMPRESSION MICRODISCECTOMY Left 09/18/2016   Procedure: LEFT LUMBAR THREE- LUMBAR FOUR LUMBAR LAMINOTOMY AND MICRODISCECTOMY;  Surgeon: Jovita Gamma, MD;  Location: [place];  Service: Neurosurgery;  Laterality: Left;  LEFT LUMBAR 3- LUMBAR 4 LUMBAR LAMINOTOMY AND MICRODISCECTOMY   PERMANENT PACEMAKER INSERTION N/A 11/22/2014   Procedure: PERMANENT PACEMAKER INSERTION;  Surgeon: [name], MD;  Location: [facility] CATH LAB;  Service: Cardiovascular;  Laterality: N/A;   PROSTATE BIOPSY     TONSILLECTOMY  1942    MEDICATIONS: No current facility-administered medications for this encounter.    atorvastatin (LIPITOR) 40 MG tablet   Carboxymethylcellul-Glycerin (LUBRICATING EYE DROPS OP)   carvedilol (COREG) 12.5 MG tablet   cetirizine (ZYRTEC) 10 MG tablet   Cholecalciferol (VITAMIN D3) 50 MCG (2000 UT) capsule   clopidogrel (PLAVIX) 75 MG tablet   cyanocobalamin (VITAMIN B12) 500 MCG tablet   sacubitril-valsartan (ENTRESTO) 24-26 MG    Myra Gianotti, PA-C Surgical Short Stay/Anesthesiology [facility] Phone [phone] [facility] Phone [phone] 09/16/2022 1:36 PM

## 2022-09-17 ENCOUNTER — Ambulatory Visit (HOSPITAL_COMMUNITY): Payer: Medicare Other | Admitting: Vascular Surgery

## 2022-09-17 ENCOUNTER — Other Ambulatory Visit: Payer: Self-pay

## 2022-09-17 ENCOUNTER — Encounter (HOSPITAL_COMMUNITY)
Admission: RE | Disposition: A | Discharge status: Home / Self Care | Payer: Self-pay | Source: Home / Self Care | Attending: General Surgery

## 2022-09-17 ENCOUNTER — Encounter (HOSPITAL_COMMUNITY): Payer: Self-pay | Admitting: General Surgery

## 2022-09-17 ENCOUNTER — Ambulatory Visit (HOSPITAL_COMMUNITY)
Admission: RE | Admit: 2022-09-17 | Discharge: 2022-09-17 | Disposition: A | Discharge status: Home / Self Care | Payer: Medicare Other | Attending: General Surgery | Admitting: General Surgery

## 2022-09-17 ENCOUNTER — Ambulatory Visit (HOSPITAL_BASED_OUTPATIENT_CLINIC_OR_DEPARTMENT_OTHER): Payer: Medicare Other | Admitting: Vascular Surgery

## 2022-09-17 DIAGNOSIS — Z8249 Family history of ischemic heart disease and other diseases of the circulatory system: Secondary | ICD-10-CM | POA: Diagnosis not present

## 2022-09-17 DIAGNOSIS — I252 Old myocardial infarction: Secondary | ICD-10-CM

## 2022-09-17 DIAGNOSIS — Z955 Presence of coronary angioplasty implant and graft: Secondary | ICD-10-CM | POA: Insufficient documentation

## 2022-09-17 DIAGNOSIS — I509 Heart failure, unspecified: Secondary | ICD-10-CM | POA: Diagnosis not present

## 2022-09-17 DIAGNOSIS — Z87891 Personal history of nicotine dependence: Secondary | ICD-10-CM

## 2022-09-17 DIAGNOSIS — K219 Gastro-esophageal reflux disease without esophagitis: Secondary | ICD-10-CM | POA: Diagnosis not present

## 2022-09-17 DIAGNOSIS — I10 Essential (primary) hypertension: Secondary | ICD-10-CM | POA: Diagnosis not present

## 2022-09-17 DIAGNOSIS — I4891 Unspecified atrial fibrillation: Secondary | ICD-10-CM | POA: Diagnosis not present

## 2022-09-17 DIAGNOSIS — I080 Rheumatic disorders of both mitral and aortic valves: Secondary | ICD-10-CM | POA: Diagnosis not present

## 2022-09-17 DIAGNOSIS — K4090 Unilateral inguinal hernia, without obstruction or gangrene, not specified as recurrent: Secondary | ICD-10-CM | POA: Insufficient documentation

## 2022-09-17 DIAGNOSIS — I110 Hypertensive heart disease with heart failure: Secondary | ICD-10-CM | POA: Insufficient documentation

## 2022-09-17 DIAGNOSIS — M1990 Unspecified osteoarthritis, unspecified site: Secondary | ICD-10-CM | POA: Diagnosis not present

## 2022-09-17 DIAGNOSIS — Z7902 Long term (current) use of antithrombotics/antiplatelets: Secondary | ICD-10-CM | POA: Insufficient documentation

## 2022-09-17 DIAGNOSIS — I2510 Atherosclerotic heart disease of native coronary artery without angina pectoris: Secondary | ICD-10-CM | POA: Diagnosis not present

## 2022-09-17 DIAGNOSIS — Z950 Presence of cardiac pacemaker: Secondary | ICD-10-CM | POA: Diagnosis not present

## 2022-09-17 HISTORY — PX: INGUINAL HERNIA REPAIR: SHX194

## 2022-09-17 HISTORY — PX: INSERTION OF MESH: SHX5868

## 2022-09-17 LAB — CBC
HCT: 36.9 % — ABNORMAL LOW (ref 39.0–52.0)
Hemoglobin: 12.2 g/dL — ABNORMAL LOW (ref 13.0–17.0)
MCH: 31.7 pg (ref 26.0–34.0)
MCHC: 33.1 g/dL (ref 30.0–36.0)
MCV: 95.8 fL (ref 80.0–100.0)
Platelets: 157 K/uL (ref 150–400)
RBC: 3.85 MIL/uL — ABNORMAL LOW (ref 4.22–5.81)
RDW: 15.4 % (ref 11.5–15.5)
WBC: 9.4 K/uL (ref 4.0–10.5)
nRBC: 0 % (ref 0.0–0.2)

## 2022-09-17 LAB — BASIC METABOLIC PANEL
Anion gap: 6 (ref 5–15)
BUN: 24 mg/dL — ABNORMAL HIGH (ref 8–23)
CO2: 28 mmol/L (ref 22–32)
Calcium: 8.7 mg/dL — ABNORMAL LOW (ref 8.9–10.3)
Chloride: 105 mmol/L (ref 98–111)
Creatinine, Ser: 0.96 mg/dL (ref 0.61–1.24)
GFR, Estimated: >60 mL/min (ref >60)
Glucose, Bld: 86 mg/dL (ref 70–99)
Potassium: 4.5 mmol/L (ref 3.5–5.1)
Sodium: 139 mmol/L (ref 135–145)

## 2022-09-17 SURGERY — REPAIR, HERNIA, INGUINAL, LAPAROSCOPIC
Anesthesia: General | Site: Groin | Laterality: Right

## 2022-09-17 MED ORDER — DEXAMETHASONE SODIUM PHOSPHATE 10 MG/ML IJ SOLN
INTRAMUSCULAR | Status: AC
  Filled 2022-09-17: qty 1

## 2022-09-17 MED ORDER — ACETAMINOPHEN 500 MG PO TABS
1000.0000 mg | ORAL_TABLET | ORAL | Status: AC
  Administered 2022-09-17: 1000 mg via ORAL
  Filled 2022-09-17: qty 2

## 2022-09-17 MED ORDER — BUPIVACAINE HCL (PF) 0.25 % IJ SOLN
INTRAMUSCULAR | Status: AC
  Filled 2022-09-17: qty 30

## 2022-09-17 MED ORDER — FENTANYL CITRATE (PF) 100 MCG/2ML IJ SOLN
INTRAMUSCULAR | Status: AC
  Filled 2022-09-17: qty 2

## 2022-09-17 MED ORDER — TRAMADOL HCL 50 MG PO TABS: 50.0000 mg | ORAL_TABLET | Freq: Four times a day (QID) | ORAL | 0 refills | Status: DC | PRN

## 2022-09-17 MED ORDER — LACTATED RINGERS IV SOLN: INTRAVENOUS | Status: DC

## 2022-09-17 MED ORDER — FENTANYL CITRATE (PF) 100 MCG/2ML IJ SOLN
25.0000 ug | INTRAMUSCULAR | Status: DC | PRN
  Administered 2022-09-17: 25 ug via INTRAVENOUS
  Administered 2022-09-17: 50 ug via INTRAVENOUS
  Administered 2022-09-17: 25 ug via INTRAVENOUS

## 2022-09-17 MED ORDER — PROPOFOL 10 MG/ML IV BOLUS
INTRAVENOUS | Status: AC
  Filled 2022-09-17: qty 20

## 2022-09-17 MED ORDER — FENTANYL CITRATE (PF) 250 MCG/5ML IJ SOLN
INTRAMUSCULAR | Status: AC
  Filled 2022-09-17: qty 5

## 2022-09-17 MED ORDER — ONDANSETRON HCL 4 MG/2ML IJ SOLN
INTRAMUSCULAR | Status: DC | PRN
  Administered 2022-09-17: 4 mg via INTRAVENOUS

## 2022-09-17 MED ORDER — TRAMADOL HCL 50 MG PO TABS
50.0000 mg | ORAL_TABLET | Freq: Once | ORAL | Status: AC
  Administered 2022-09-17: 50 mg via ORAL

## 2022-09-17 MED ORDER — EPHEDRINE 5 MG/ML INJ
INTRAVENOUS | Status: AC
  Filled 2022-09-17: qty 10

## 2022-09-17 MED ORDER — ONDANSETRON HCL 4 MG/2ML IJ SOLN
INTRAMUSCULAR | Status: AC
  Filled 2022-09-17: qty 2

## 2022-09-17 MED ORDER — PROPOFOL 10 MG/ML IV BOLUS
INTRAVENOUS | Status: DC | PRN
  Administered 2022-09-17: 110 mg via INTRAVENOUS
  Administered 2022-09-17: 20 mg via INTRAVENOUS

## 2022-09-17 MED ORDER — BUPIVACAINE HCL 0.25 % IJ SOLN
INTRAMUSCULAR | Status: DC | PRN
  Administered 2022-09-17: 13 mL

## 2022-09-17 MED ORDER — AMISULPRIDE (ANTIEMETIC) 5 MG/2ML IV SOLN: 10.0000 mg | Freq: Once | INTRAVENOUS | Status: DC | PRN

## 2022-09-17 MED ORDER — PHENYLEPHRINE HCL (PRESSORS) 10 MG/ML IV SOLN
INTRAVENOUS | Status: DC | PRN
  Administered 2022-09-17: 80 ug via INTRAVENOUS

## 2022-09-17 MED ORDER — ROCURONIUM BROMIDE 10 MG/ML (PF) SYRINGE
PREFILLED_SYRINGE | INTRAVENOUS | Status: DC | PRN
  Administered 2022-09-17: 50 mg via INTRAVENOUS

## 2022-09-17 MED ORDER — CHLORHEXIDINE GLUCONATE CLOTH 2 % EX PADS: 6.0000 | MEDICATED_PAD | Freq: Once | CUTANEOUS | Status: DC

## 2022-09-17 MED ORDER — ORAL CARE MOUTH RINSE: 15.0000 mL | Freq: Once | OROMUCOSAL | Status: AC

## 2022-09-17 MED ORDER — CHLORHEXIDINE GLUCONATE 0.12 % MT SOLN
15.0000 mL | Freq: Once | OROMUCOSAL | Status: AC
  Administered 2022-09-17: 15 mL via OROMUCOSAL
  Filled 2022-09-17: qty 15

## 2022-09-17 MED ORDER — 0.9 % SODIUM CHLORIDE (POUR BTL) OPTIME
TOPICAL | Status: DC | PRN
  Administered 2022-09-17: 1000 mL

## 2022-09-17 MED ORDER — TRAMADOL HCL 50 MG PO TABS
ORAL_TABLET | ORAL | Status: AC
  Filled 2022-09-17: qty 1

## 2022-09-17 MED ORDER — ENSURE PRE-SURGERY PO LIQD: 296.0000 mL | Freq: Once | ORAL | Status: DC

## 2022-09-17 MED ORDER — LIDOCAINE 2% (20 MG/ML) 5 ML SYRINGE
INTRAMUSCULAR | Status: DC | PRN
  Administered 2022-09-17: 60 mg via INTRAVENOUS

## 2022-09-17 MED ORDER — PHENYLEPHRINE HCL-NACL 20-0.9 MG/250ML-% IV SOLN
INTRAVENOUS | Status: DC | PRN
  Administered 2022-09-17: 25 ug/min via INTRAVENOUS

## 2022-09-17 MED ORDER — LIDOCAINE 2% (20 MG/ML) 5 ML SYRINGE
INTRAMUSCULAR | Status: AC
  Filled 2022-09-17: qty 5

## 2022-09-17 MED ORDER — FENTANYL CITRATE (PF) 250 MCG/5ML IJ SOLN
INTRAMUSCULAR | Status: DC | PRN
  Administered 2022-09-17 (×2): 50 ug via INTRAVENOUS
  Administered 2022-09-17: 100 ug via INTRAVENOUS

## 2022-09-17 MED ORDER — CEFAZOLIN SODIUM-DEXTROSE 2-4 GM/100ML-% IV SOLN
2.0000 g | INTRAVENOUS | Status: AC
  Administered 2022-09-17: 2 g via INTRAVENOUS
  Filled 2022-09-17: qty 100

## 2022-09-17 MED ORDER — DEXAMETHASONE SODIUM PHOSPHATE 10 MG/ML IJ SOLN
INTRAMUSCULAR | Status: DC | PRN
  Administered 2022-09-17: 4 mg via INTRAVENOUS

## 2022-09-17 MED ORDER — SUGAMMADEX SODIUM 200 MG/2ML IV SOLN
INTRAVENOUS | Status: DC | PRN
  Administered 2022-09-17: 200 mg via INTRAVENOUS

## 2022-09-17 MED ORDER — ROCURONIUM BROMIDE 10 MG/ML (PF) SYRINGE
PREFILLED_SYRINGE | INTRAVENOUS | Status: AC
  Filled 2022-09-17: qty 10

## 2022-09-17 SURGICAL SUPPLY — 45 items
ADH SKN CLS APL DERMABOND .7 (GAUZE/BANDAGES/DRESSINGS) ×1
BAG COUNTER SPONGE SURGICOUNT (BAG) ×1 IMPLANT
BAG SPNG CNTER NS LX DISP (BAG) ×1
CANISTER SUCT 3000ML PPV (MISCELLANEOUS) IMPLANT
COVER SURGICAL LIGHT HANDLE (MISCELLANEOUS) ×1 IMPLANT
DERMABOND ADVANCED .7 DNX12 (GAUZE/BANDAGES/DRESSINGS) ×1 IMPLANT
DISSECTOR BLUNT TIP ENDO 5MM (MISCELLANEOUS) IMPLANT
ELECT REM PT RETURN 9FT ADLT (ELECTROSURGICAL) ×1
ELECTRODE REM PT RTRN 9FT ADLT (ELECTROSURGICAL) ×1 IMPLANT
GLOVE BIO SURGEON STRL SZ7.5 (GLOVE) ×1 IMPLANT
GLOVE SURG SYN 7.5  E (GLOVE) ×1
GLOVE SURG SYN 7.5 E (GLOVE) ×1 IMPLANT
GLOVE SURG SYN 7.5 PF PI (GLOVE) ×1 IMPLANT
GOWN STRL REUS W/ TWL LRG LVL3 (GOWN DISPOSABLE) ×2 IMPLANT
GOWN STRL REUS W/ TWL XL LVL3 (GOWN DISPOSABLE) ×1 IMPLANT
GOWN STRL REUS W/TWL LRG LVL3 (GOWN DISPOSABLE) ×2
GOWN STRL REUS W/TWL XL LVL3 (GOWN DISPOSABLE) ×1
KIT BASIN OR (CUSTOM PROCEDURE TRAY) ×1 IMPLANT
KIT TURNOVER KIT B (KITS) ×1 IMPLANT
MESH 3DMAX 5X7 RT XLRG (Mesh General) IMPLANT
NDL INSUFFLATION 14GA 120MM (NEEDLE) IMPLANT
NEEDLE INSUFFLATION 14GA 120MM (NEEDLE) ×1 IMPLANT
NS IRRIG 1000ML POUR BTL (IV SOLUTION) ×1 IMPLANT
PAD ARMBOARD 7.5X6 YLW CONV (MISCELLANEOUS) ×2 IMPLANT
RELOAD STAPLE 4.0 BLU F/HERNIA (INSTRUMENTS) IMPLANT
RELOAD STAPLE 4.8 BLK F/HERNIA (STAPLE) IMPLANT
RELOAD STAPLE HERNIA 4.0 BLUE (INSTRUMENTS) ×1 IMPLANT
RELOAD STAPLE HERNIA 4.8 BLK (STAPLE) IMPLANT
SCISSORS LAP 5X35 DISP (ENDOMECHANICALS) ×1 IMPLANT
SET IRRIG TUBING LAPAROSCOPIC (IRRIGATION / IRRIGATOR) IMPLANT
SET TUBE SMOKE EVAC HIGH FLOW (TUBING) ×1 IMPLANT
STAPLER HERNIA 12 8.5 360D (INSTRUMENTS) IMPLANT
SUT MNCRL AB 4-0 PS2 18 (SUTURE) ×1 IMPLANT
SUT VIC AB 1 CT1 27 (SUTURE)
SUT VIC AB 1 CT1 27XBRD ANBCTR (SUTURE) IMPLANT
SYRINGE TOOMEY DISP (SYRINGE) ×1 IMPLANT
TOWEL GREEN STERILE (TOWEL DISPOSABLE) ×1 IMPLANT
TOWEL GREEN STERILE FF (TOWEL DISPOSABLE) ×1 IMPLANT
TRAY FOLEY W/BAG SLVR 14FR (SET/KITS/TRAYS/PACK) ×1 IMPLANT
TRAY LAPAROSCOPIC MC (CUSTOM PROCEDURE TRAY) ×1 IMPLANT
TROCAR OPTICAL SHORT 5MM (TROCAR) ×1 IMPLANT
TROCAR OPTICAL SLV SHORT 5MM (TROCAR) ×1 IMPLANT
TROCAR Z THREAD OPTICAL 12X100 (TROCAR) ×1 IMPLANT
WARMER LAPAROSCOPE (MISCELLANEOUS) ×1 IMPLANT
WATER STERILE IRR 1000ML POUR (IV SOLUTION) ×1 IMPLANT

## 2022-09-17 NOTE — Op Note (Signed)
09/17/2022  1:09 PM  PATIENT:  Andrew Marshall  89 y.o. male  PRE-OPERATIVE DIAGNOSIS:  RIGHT INGUINAL HERNIA  POST-OPERATIVE DIAGNOSIS:  RIGHT INGUINAL HERNIA  PROCEDURE:  Procedure(s): LAPAROSCOPIC RIGHT INGUINAL HERNIA REPAIR WITH MESH (Right) INSERTION OF MESH (Right)  SURGEON:  Surgeon(s) and Role:    * Ralene Ok, MD - Primary   ASSISTANTS: Konstantinos Economopoulos MD    ANESTHESIA:   local and general  EBL:  25 mL   BLOOD ADMINISTERED:none  DRAINS: none   LOCAL MEDICATIONS USED:  BUPIVICAINE   SPECIMEN:  No Specimen  DISPOSITION OF SPECIMEN:  N/A  COUNTS:  YES  TOURNIQUET:  * No tourniquets in log *  DICTATION: .Dragon Dictation   Counts: reported as correct x 2  Findings:  The patient had a medium right direct hernia and cord lipoma  Indications for procedure:  The patient is a 89 year old male with a right hernia for several months. Patient complained of symptomatology to his right inguinal area. The patient was taken back for elective inguinal hernia repair.  Details of the procedure: The patient was taken back to the operating room. The patient was placed in supine position with bilateral SCDs in place.  The patient was prepped and draped in the usual sterile fashion.  After appropriate anitbiotics were confirmed, a time-out was confirmed and all facts were verified.  0.25% Marcaine was used to infiltrate the umbilical area. A 11-blade was used to cut down the skin and blunt dissection was used to get the anterior fashion.  The anterior fascia was incised approximately 1 cm and the muscles were retracted laterally. Blunt dissection was then used to create a space in the preperitoneal area. At this time a 10 mm camera was then introduced into the space and advanced the pubic tubercle and a 12 mm trocar was placed over this and insufflation was started.  While introducing the camera we could see we were in the peritoneum, the camera was withdrawn and I  was able to find the preperitoneal space.  At this time and space was created from medial to laterally the preperitoneal space.  Cooper's ligament was initially cleaned off.  The hernia was identified in the direct space. Dissection of the hernia sac was undertaken and it was fully reduced.  The transversalis fascia retracted spontaneously.    The spermatic cord and the vas deferens was identified and protected in all parts of the case. Cremasterics were dissected off from the cord.    The peritoneum was taken down to approximately the umbilicus a Bard 3D Max mesh, size: Rachelle Hora, was  introduced into the preperitoneal space.  The mesh was brought over to cover the direct and indirect hernia spaces.  This was anchored into place and secured to Cooper's ligament with 4.75m staples from a Cov[place] hernia stapler. It was anchored to the anterior abdominal wall with 4.8 mm staples. The peritoneum was seen lying posterior to the mesh. There was no staples placed laterally. There was a tear in the peritoneum that was seen lateral.  4.885mstaples were placed to bring the peritoneal defect together.The insufflation was evacuated and the peritoneum was seen posterior to the mesh. The trochars were removed. The anterior fascia was reapproximated using #1 Vicryl on a UR- 6.  Intra-abdominal air was evacuated and the Veress needle removed. The skin was reapproximated using 4-0 Monocryl subcuticular fashion.  The skin was dressed with Dermabond.  The patient was awakened from general anesthesia and taken to recovery in  stable condition.   PLAN OF CARE: Discharge to home after PACU  PATIENT DISPOSITION:  PACU - hemodynamically stable.   Delay start of Pharmacological VTE agent (>24hrs) due to surgical blood loss or risk of bleeding: not applicable

## 2022-09-17 NOTE — Anesthesia Postprocedure Evaluation (Signed)
Anesthesia Post Note  Patient: Andrew Marshall  Procedure(s) Performed: LAPAROSCOPIC RIGHT INGUINAL HERNIA REPAIR WITH MESH (Right: Groin) INSERTION OF MESH (Right: Groin)     Patient location during evaluation: PACU Anesthesia Type: General Level of consciousness: awake and alert Pain management: pain level controlled Vital Signs Assessment: post-procedure vital signs reviewed and stable Respiratory status: spontaneous breathing, nonlabored ventilation and respiratory function stable Cardiovascular status: stable and blood pressure returned to baseline Anesthetic complications: no   No notable events documented.  Last Vitals:  Vitals:   09/17/22 1415 09/17/22 1430  BP: (!) 127/49 (!) 141/54  Pulse: 60 64  Resp: 12 16  Temp:    SpO2: 96% 97%    Last Pain:  Vitals:   09/17/22 1430  TempSrc:   PainSc: [place]

## 2022-09-17 NOTE — Anesthesia Procedure Notes (Signed)
Procedure Name: Intubation Date/Time: 09/17/2022 12:15 PM  Performed by: Wilburn Cornelia, CRNAPre-anesthesia Checklist: Patient identified, Emergency Drugs available, Suction available, Patient being monitored and Timeout performed Patient Re-evaluated:Patient Re-evaluated prior to induction Oxygen Delivery Method: Circle system utilized Preoxygenation: Pre-oxygenation with 100% oxygen Induction Type: IV induction Ventilation: Mask ventilation without difficulty Laryngoscope Size: Mac and 4 Grade View: Grade II Tube type: Oral Tube size: 7.5 mm Number of attempts: 1 Airway Equipment and Method: Stylet Placement Confirmation: ETT inserted through vocal cords under direct vision, positive ETCO2, CO2 detector and breath sounds checked- equal and bilateral Secured at: 23 cm Tube secured with: Tape Dental Injury: Teeth and Oropharynx as per pre-operative assessment

## 2022-09-17 NOTE — Progress Notes (Signed)
Remote pacemaker transmission.   

## 2022-09-17 NOTE — Transfer of Care (Signed)
Immediate Anesthesia Transfer of Care Note  Patient: Andrew Marshall  Procedure(s) Performed: LAPAROSCOPIC RIGHT INGUINAL HERNIA REPAIR WITH MESH (Right: Groin) INSERTION OF MESH (Right: Groin)  Patient Location: PACU  Anesthesia Type:General  Level of Consciousness: awake and alert   Airway & Oxygen Therapy: Patient Spontanous Breathing and Patient connected to nasal cannula oxygen  Post-op Assessment: Report given to RN and Post -op Vital signs reviewed and stable  Post vital signs: Reviewed and stable  Last Vitals:  Vitals Value Taken Time  BP 136/75 09/17/22 1317  Temp    Pulse 64 09/17/22 1319  Resp 15 09/17/22 1319  SpO2 99 % 09/17/22 1319  Vitals shown include unvalidated device data.  Last Pain:  Vitals:   09/17/22 1049  TempSrc:   PainSc: 0-No pain      Patients Stated Pain Goal: 0 (02/77/41 2878)  Complications: No notable events documented.

## 2022-09-17 NOTE — H&P (Signed)
Chief Complaint: Recheck       History of Present Illness: Andrew Marshall is a 89 y.o. male who is seen today as an office consultation at the request of Dr. Pasty Arch for evaluation of Recheck .   Patient is an 89 year old male with a right inguinal hernia follows back up today.  Was previously seen in June 2022.   Patient has a history of pacemaker, A-fib- On Plavix patient does state that since his last visit hernia has gotten somewhat larger.  He states recently she had a back injury and has not cycled as much.  He has started back.  He would like to fix the hernia if possible prior to getting older.     Review of Systems: A complete review of systems was obtained from the patient.  I have reviewed this information and discussed as appropriate with the patient.  See HPI as well for other ROS.   Review of Systems  Constitutional:  Negative for fever.  HENT:  Negative for congestion.   Eyes:  Negative for blurred vision.  Respiratory:  Negative for cough, shortness of breath and wheezing.   Cardiovascular:  Negative for chest pain and palpitations.  Gastrointestinal:  Negative for heartburn.  Genitourinary:  Negative for dysuria.  Musculoskeletal:  Negative for myalgias.  Skin:  Negative for rash.  Neurological:  Negative for dizziness and headaches.  Psychiatric/Behavioral:  Negative for depression and suicidal ideas.   All other systems reviewed and are negative.      Medical History: Past Medical History Past Medical History: Diagnosis        Date            Arthritis                         CHF (congestive heart failure) (CMS-HCC)                History of cancer                 There is no problem list on file for this patient.     Past Surgical History Past Surgical History: Procedure       Laterality         Date            LEFT LUMBAR THREE- LUMBAR FOUR LUMBAR LAMINOTOMY AND MICRODISCECTOMY (Left: Spine Lumbar)                        09/18/2016             REPOSITIONING PREVIOUSLY IMPLANTED TRANSVENOUS PACEMAKER OR ICD ELECTRODE                                     2016 & 2021       Allergies Allergies Allergen           Reactions            Erythromycin   Nausea And Vomiting, Other (See Comments) and Abdominal Pain                         GI upset and pains            Zolpidem         Other (See Comments) and Unknown  Sleep walk       Current Outpatient Medications on File Prior to Visit Medication       Sig       Dispense         Refill            atorvastatin (LIPITOR) 40 MG tablet  1 tablet                                     carvediloL (COREG) 12.5 MG tablet  1 tablet                                     clopidogreL (PLAVIX) 75 mg tablet    1 tablet                                     sacubitriL-valsartan (ENTRESTO) 24-26 mg tablet   Take 1 tablet by mouth 2 (two) times daily                         No current facility-administered medications on file prior to visit.     Family History Family History Problem           Relation           Age of Onset            Coronary Artery Disease (Blocked arteries around heart)     Mother              Breast cancer  Mother              Coronary Artery Disease (Blocked arteries around heart)     Father               Diabetes          Brother                    Social History   Tobacco Use Smoking Status           Former            Types: Cigarettes            Quit date:        2007            Years since quitting:   16.6 Smokeless Tobacco   Never     Social History Social History     Socioeconomic History            Marital status:  Married Tobacco Use            Smoking status:          Former                         Types: Cigarettes                         Quit date:        2007                         Years since quitting:   15.6  Smokeless tobacco:    Never Substance and Sexual Activity            Alcohol use:    Yes                          Alcohol/week:  2.0 standard drinks                         Types: 2 Standard drinks or equivalent per week            Drug use:        Never       Objective:     BP (!) 123/56   Pulse 62   Temp 98.6 F (37 C) (Oral)   Resp 18   Ht '5\' 8"'$  (1.727 m)   Wt 63.5 kg   SpO2 99%   BMI 21.29 kg/m   Body mass index is 22.69 kg/m. Physical Exam Constitutional:      Appearance: Normal appearance.  HENT:     Head: Normocephalic and atraumatic.     Nose: Nose normal. No congestion.     Mouth/Throat:     Mouth: Mucous membranes are moist.     Pharynx: Oropharynx is clear.  Eyes:     Pupils: Pupils are equal, round, and reactive to light.  Cardiovascular:     Rate and Rhythm: Normal rate and regular rhythm.     Pulses: Normal pulses.     Heart sounds: Normal heart sounds. No murmur heard.   No friction rub. No gallop.  Pulmonary:     Effort: Pulmonary effort is normal. No respiratory distress.     Breath sounds: Normal breath sounds. No stridor. No wheezing, rhonchi or rales.  Abdominal:     General: Abdomen is flat.     Hernia: A hernia is present. Hernia is present in the right inguinal area.  Musculoskeletal:        General: Normal range of motion.     Cervical back: Normal range of motion.  Skin:    General: Skin is warm and dry.  Neurological:     General: No focal deficit present.     Mental Status: He is alert and oriented to person, place, and time.  Psychiatric:        Mood and Affect: Mood normal.        Thought Content: Thought content normal.        Assessment and Plan: Diagnoses and all orders for this visit:   Unilateral recurrent inguinal hernia without obstruction or gangrene     Andrew Marshall is a 89 y.o. male    WE will reach out to Dr. Gypsy Balsam for cardiac evaluation/stratification.  If he is deemed at moderate risk, can proceed with open vs lap surgery.  If at high risk will not proceed with surgery.  The pt is OK with this plan   1.           We will proceed to the OR for a open (sedation) right inguinal hernia repair with mesh. 2.         All risks and benefits were discussed with the patient, to generally include infection, bleeding, damage to surrounding structures, acute and chronic nerve pain, and recurrence. Alternatives were offered and described.  All questions were answered and the patient voiced understanding of the procedure and wishes to proceed at this point.  No follow-ups on file.   Ralene Ok, MD, [facility], [facility]

## 2022-09-17 NOTE — Discharge Instructions (Signed)
[facility] _______[facility] ? ?INGUINAL HERNIA REPAIR: POST OP INSTRUCTIONS ? ?Always review your discharge instruction sheet given to you by the facility where your surgery was performed. ?IF YOU HAVE DISABILITY OR FAMILY LEAVE FORMS, YOU MUST BRING THEM TO THE OFFICE FOR PROCESSING.   ?DO NOT GIVE THEM TO YOUR DOCTOR. ? ?1. A  prescription for pain medication may be given to you upon discharge.  Take your pain medication as prescribed, if needed.  If narcotic pain medicine is not needed, then you may take acetaminophen (Tylenol) or ibuprofen (Advil) as needed. ?2. Take your usually prescribed medications unless otherwise directed. ?If you need a refill on your pain medication, please contact your pharmacy.  They will contact our office to request authorization. Prescriptions will not be filled after 5 pm or on week-ends. ?3. You should follow a light diet the first 24 hours after arrival home, such as soup and crackers, etc.  Be sure to include lots of fluids daily.  Resume your normal diet the day after surgery. ?4.Most patients will experience some swelling and bruising around the umbilicus or in the groin and scrotum.  Ice packs and reclining will help.  Swelling and bruising can take several days to resolve.  ?6. It is common to experience some constipation if taking pain medication after surgery.  Increasing fluid intake and taking a stool softener (such as Colace) will usually help or prevent this problem from occurring.  A mild laxative (Milk of Magnesia or Miralax) should be taken according to package directions if there are no bowel movements after 48 hours. ?7. Unless discharge instructions indicate otherwise, you may remove your bandages 24-48 hours after surgery, and you may shower at that time.  You may have steri-strips (small skin tapes) in place directly over the incision.  These strips should be left on the skin for 7-10 days.  If your surgeon used skin glue on the incision, you may  shower in 24 hours.  The glue will flake off over the next 2-3 weeks.  Any sutures or staples will be removed at the office during your follow-up visit. ?8. ACTIVITIES:  You may resume regular (light) daily activities beginning the next day--such as daily self-care, walking, climbing stairs--gradually increasing activities as tolerated.  You may have sexual intercourse when it is comfortable.  Refrain from any heavy lifting or straining until approved by your doctor. ? ?a.You may drive when you are no longer taking prescription pain medication, you can comfortably wear a seatbelt, and you can safely maneuver your car and apply brakes. ?b.RETURN TO WORK:   ?_____________________________________________ ? ?9.You should see your doctor in the office for a follow-up appointment approximately 2-3 weeks after your surgery.  Make sure that you call for this appointment within a day or two after you arrive home to insure a convenient appointment time. ?10.OTHER INSTRUCTIONS: _________________________ ?   _____________________________________ ? ?WHEN TO CALL YOUR DOCTOR: ?Fever over 101.0 ?Inability to urinate ?Nausea and/or vomiting ?Extreme swelling or bruising ?Continued bleeding from incision. ?Increased pain, redness, or drainage from the incision ? ?The clinic staff is available to answer your questions during regular business hours.  Please don?t hesitate to call and ask to speak to one of the nurses for clinical concerns.  If you have a medical emergency, go to the nearest emergency room or call 911.  A surgeon from [facility] is always on call at the hospital ? ? ?[address], [address], [place], [place]    [address] ? ? [address] [address], [place], [place]   [address] ?[phone] ? [phone] ? FAX [phone] ?Web site: [email or web address] ? ?

## 2022-09-18 ENCOUNTER — Encounter (HOSPITAL_COMMUNITY): Payer: Self-pay | Admitting: General Surgery

## 2022-09-23 ENCOUNTER — Ambulatory Visit: Payer: Medicare Other | Attending: Cardiology

## 2022-09-23 DIAGNOSIS — Z950 Presence of cardiac pacemaker: Secondary | ICD-10-CM

## 2022-09-23 DIAGNOSIS — I5022 Chronic systolic (congestive) heart failure: Secondary | ICD-10-CM

## 2022-09-25 ENCOUNTER — Telehealth: Payer: Self-pay

## 2022-09-25 NOTE — Progress Notes (Signed)
EPIC Encounter for ICM Monitoring  Patient Name: Andrew Marshall is a 89 y.o. male Date: 09/25/2022 Primary Care Physican: Lujean Amel, MD Primary Cardiologist: Marlou Porch Electrophysiologist: Marisa Sprinkles Pacing: 98%            05/01/2022 Home Weight: 141 lbs                                                            Attempted call to patient and unable to reach.   Transmission reviewed.    CorVue thoracic impedance suggesting normal fluid levels.   Prescribed: No diuretic   Recommendations:  Unable to reach.     Follow-up plan: ICM clinic phone appointment on 10/28/2022.  91 day device clinic remote transmission 11/29/2022.     EP/Cardiology Office Visits:  Recall 10/13/2022 with Dr Quentin Ore.  Recall 01/19/2023 with Dr Marlou Porch.     Copy of ICM check sent to Dr Quentin Ore.    3 month ICM trend: 09/23/2022.    12-14 Month ICM trend:     Rosalene Billings, RN 09/25/2022 8:32 AM

## 2022-09-25 NOTE — Telephone Encounter (Signed)
Remote ICM transmission received.  Attempted call to patient regarding ICM remote transmission and no answer or voice mail set up.

## 2022-09-29 ENCOUNTER — Other Ambulatory Visit: Payer: Self-pay | Admitting: Cardiology

## 2022-10-17 DIAGNOSIS — D3132 Benign neoplasm of left choroid: Secondary | ICD-10-CM | POA: Diagnosis not present

## 2022-10-17 DIAGNOSIS — H52203 Unspecified astigmatism, bilateral: Secondary | ICD-10-CM | POA: Diagnosis not present

## 2022-10-17 DIAGNOSIS — Z961 Presence of intraocular lens: Secondary | ICD-10-CM | POA: Diagnosis not present

## 2022-10-28 ENCOUNTER — Ambulatory Visit: Payer: Medicare Other | Attending: Cardiology

## 2022-10-28 DIAGNOSIS — I5022 Chronic systolic (congestive) heart failure: Secondary | ICD-10-CM | POA: Diagnosis not present

## 2022-10-28 DIAGNOSIS — Z950 Presence of cardiac pacemaker: Secondary | ICD-10-CM | POA: Diagnosis not present

## 2022-10-30 NOTE — Progress Notes (Signed)
EPIC Encounter for ICM Monitoring  Patient Name: Andrew Marshall is a 89 y.o. male Date: 10/30/2022 Primary Care Physican: Lujean Amel, MD Primary Cardiologist: Marlou Porch Electrophysiologist: Marisa Sprinkles Pacing: 98%            05/01/2022 Home Weight: 141 lbs 10/30/2022 Weight: 140-143                                                            Spoke with patient and heart failure questions reviewed.  Transmission results reviewed.  Pt asymptomatic for fluid accumulation.  Reports feeling well at this time and voices no complaints.     CorVue thoracic impedance suggesting normal fluid levels.    Prescribed: No diuretic   Recommendations:  No changes and encouraged to call if experiencing any fluid symptoms.   Follow-up plan: ICM clinic phone appointment on 12/02/2022.  91 day device clinic remote transmission 11/29/2022.     EP/Cardiology Office Visits: Provided EP scheduler phone number and encouraged to call for yearly EP appt.   Recall 10/13/2022 with Dr Quentin Ore.  Recall 01/19/2023 with Dr Marlou Porch.     Copy of ICM check sent to Dr Quentin Ore.   3 month ICM trend: 10/28/2022.    12-14 Month ICM trend:     Rosalene Billings, RN 10/30/2022 10:43 AM

## 2022-11-12 DIAGNOSIS — L821 Other seborrheic keratosis: Secondary | ICD-10-CM | POA: Diagnosis not present

## 2022-11-12 DIAGNOSIS — L309 Dermatitis, unspecified: Secondary | ICD-10-CM | POA: Diagnosis not present

## 2022-11-13 ENCOUNTER — Telehealth: Payer: Self-pay | Admitting: Cardiology

## 2022-11-13 NOTE — Telephone Encounter (Signed)
Call received in [name] from Andrew Marshall.   Per message received Andrew Marshall had a near syncopal episode yesterday.  I called Andrew Marshall back, including the spouse X4.  I was unable to obtain any information regarding this message, and unable to leave a voicemail message on Andrew Marshall and spouse telephone.    Andrew Marshall has a St Jude BiV PPM,  and has an appointment with Dr. Lars Mage on 12/27/2022.  Andrew Marshall is due for a Device check on 11/29/2022.    ========================  Called Andrew Marshall back again on home line, and Andrew Marshall answered.    Andrew Marshall stated yesterday, on 11/12/2022 at 315 pm, Andrew Marshall felt lightheaded while standing in front of his refrigerator.   Andrew Marshall states his legs got weak, and he dropped down to his knees.  The episode lasted 5-6 seconds, and he did NOT lose consciousness.  ( Near syncopal episode. )  Andrew Marshall states he got up / sat in a chair / took his BP and HR 4-5 minutes after episode, which was 136/73, and HR was 64.    Andrew Marshall states he took all of his morning meds as he normally does.  This is the 1st event he has ever experienced like this.     I will forward this concern to [place] Team to review a transmission.  Andrew Marshall has St Jude BiV Pacemaker.

## 2022-11-13 NOTE — Telephone Encounter (Signed)
Transmission received 11/13/2022

## 2022-11-13 NOTE — Telephone Encounter (Signed)
Pt called back per message received from Device RN.   Pt made aware of pacemaker transmission results per Device RN.  Device showed normal function, no alerts triggered during time of complaint;  All of this information shared with Pt.    Pt advised to call [facility] immediately if he feels lightheaded again, or feels like he may pass out.  Pt also told that we will forward this occurrence / report to Dr. Marlou Porch and his RN for review.  Device RN forwarding this transmission report.

## 2022-11-13 NOTE — Telephone Encounter (Signed)
Pt would like a callback regarding almost passing out yesterday.

## 2022-11-13 NOTE — Telephone Encounter (Signed)
Patient has pacemaker. Transmission received and reviewed. Shows normal device function. No alerts triggered during time of complaints.   Routing to triage to advise.

## 2022-11-15 DIAGNOSIS — H905 Unspecified sensorineural hearing loss: Secondary | ICD-10-CM | POA: Diagnosis not present

## 2022-11-18 ENCOUNTER — Telehealth: Payer: Self-pay | Admitting: Cardiology

## 2022-11-18 NOTE — Telephone Encounter (Signed)
Pt called and left a voicemail stating that he had a fainting spell last week and wants to know if anything showed on his device. He is asking if he should be seen sooner than may 3rd (previous [facility] pt)

## 2022-11-18 NOTE — Telephone Encounter (Signed)
Pt would like sooner appt with EP APP.  Advised would discuss with scheduler and return call tomorrow.

## 2022-11-19 NOTE — Telephone Encounter (Signed)
Pt scheduled to see AT next week.

## 2022-11-26 NOTE — Progress Notes (Unsigned)
  Electrophysiology Office Note:   Date:  11/27/2022  ID:  Andrew Marshall, DOB Dec 31, 1930, MRN [id number]  Primary Cardiologist: Candee Furbish, MD Electrophysiologist:  Dr. [name] -> Vickie Epley, MD   History of Present Illness:   Andrew Marshall is a 89 y.o. 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    Patient reports doing well overall. 3/15 he had a singular episode of dizziness and weakness while standing at the fridge. He had eaten breakfast, lunch, and taken his meds as normal that am. It occurred around 330. He fell down to his knees but did not lose consciousness. His BP was somewhat soft that am, in the 100s when it usually runs 120-130s, even 140s.  He has been taking only 1/2 tab of Entresto for some time. He denies chest pain, palpitations, dyspnea, PND, orthopnea, nausea, vomiting,  syncope, edema, weight gain, or early satiety.   Review of systems complete and found to be negative unless listed in HPI.   Device History: St. Jude PPM implanted 2016 as dual chamber with BIV upgrade 11/30/2019 for CHB and NICM vs mixed ICM/NICM  Studies Reviewed:    PPM Interrogation-  reviewed in detail today,  See PACEART report.  EKG is ordered today. Personal review shows AV dual paced at 60 bpm  Risk Assessment/Calculations:          Physical Exam:   VS:  BP 130/62   Pulse 60   Ht 5\' 8"  (1.727 m)   Wt 152 lb (68.9 kg)   SpO2 98%   BMI 23.11 kg/m    Wt Readings from Last 3 Encounters:  11/27/22 152 lb (68.9 kg)  09/17/22 140 lb (63.5 kg)  07/23/22 151 lb (68.5 kg)     GEN: Well nourished, well developed in no acute distress NECK: No JVD; No carotid bruits CARDIAC: Regular rate and rhythm, no murmurs, rubs, gallops RESPIRATORY:  Clear to auscultation without rales, wheezing or rhonchi  ABDOMEN: Soft, non-tender, non-distended EXTREMITIES:  No edema; No  deformity   ASSESSMENT AND PLAN:    CHB s/p Abbott PPM  Normal PPM function See Claudia Desanctis Art report No changes today  Chronic systolic CHF s/p BIV upgrade Echo 01/2022 LVEF 35-40%, moderate MR, mild AS  PAF, subclinical Burden <1%, todays check PDF was cut off. But no prolonged episodes. Most recent episode was 3/11 and 8 seconds long.  Not on [place]   HTN Increase Entresto to 24/26 mg BID, it is not intended to be halved.  If he is unable to tolerated, will need to switch to Losartan. He will let us know.   Near syncope 1 episode. BP was lower that am, but no other extenuating circumstances by his recollection.   For now, we will treat as an isolated event, and with his intermittent HIGH BP, we will have him appropriately take Entresto 24/26 mg BID, no half tablets.  Nothing on device.   CAD Denies s/s ischemia Patent stents 08/2017  Disposition:   Follow up with EP APP in 3 months  Signed, Shirley Friar, PA-C

## 2022-11-27 ENCOUNTER — Encounter: Payer: Self-pay | Admitting: Student

## 2022-11-27 ENCOUNTER — Ambulatory Visit: Payer: Medicare Other | Attending: Cardiology | Admitting: Student

## 2022-11-27 VITALS — BP 130/62 | HR 60 | Ht 68.0 in | Wt 152.0 lb

## 2022-11-27 DIAGNOSIS — I2510 Atherosclerotic heart disease of native coronary artery without angina pectoris: Secondary | ICD-10-CM

## 2022-11-27 DIAGNOSIS — I340 Nonrheumatic mitral (valve) insufficiency: Secondary | ICD-10-CM

## 2022-11-27 DIAGNOSIS — I442 Atrioventricular block, complete: Secondary | ICD-10-CM | POA: Diagnosis not present

## 2022-11-27 DIAGNOSIS — I5022 Chronic systolic (congestive) heart failure: Secondary | ICD-10-CM | POA: Diagnosis not present

## 2022-11-27 LAB — CUP PACEART INCLINIC DEVICE CHECK
Battery Remaining Longevity: 39 mo
Battery Voltage: 2.98 V
Brady Statistic RA Percent Paced: 93 %
Brady Statistic RV Percent Paced: 98 %
Date Time Interrogation Session: 20240403113320
Implantable Lead Connection Status: 753985
Implantable Lead Connection Status: 753985
Implantable Lead Connection Status: 753985
Implantable Lead Implant Date: 20160329
Implantable Lead Implant Date: 20160329
Implantable Lead Implant Date: 20210406
Implantable Lead Location: 753858
Implantable Lead Location: 753859
Implantable Lead Location: 753860
Implantable Lead Model: 1948
Implantable Pulse Generator Implant Date: 20210406
Lead Channel Impedance Value: 387.5 Ohm
Lead Channel Impedance Value: 687.5 Ohm
Lead Channel Impedance Value: 750 Ohm
Lead Channel Pacing Threshold Amplitude: 0.625 V
Lead Channel Pacing Threshold Amplitude: 0.75 V
Lead Channel Pacing Threshold Amplitude: 2 V
Lead Channel Pacing Threshold Pulse Width: 0.5 ms
Lead Channel Pacing Threshold Pulse Width: 0.5 ms
Lead Channel Pacing Threshold Pulse Width: 0.5 ms
Lead Channel Sensing Intrinsic Amplitude: 10.8 mV
Lead Channel Sensing Intrinsic Amplitude: 2.3 mV
Lead Channel Setting Pacing Amplitude: 1.625
Lead Channel Setting Pacing Amplitude: 2 V
Lead Channel Setting Pacing Amplitude: 2.5 V
Lead Channel Setting Pacing Pulse Width: 0.5 ms
Lead Channel Setting Pacing Pulse Width: 0.5 ms
Lead Channel Setting Sensing Sensitivity: 4 mV
Pulse Gen Model: 3562
Pulse Gen Serial Number: 6058048

## 2022-11-27 MED ORDER — ENTRESTO 24-26 MG PO TABS: 1.0000 | ORAL_TABLET | Freq: Two times a day (BID) | ORAL | 11 refills | Status: DC

## 2022-11-27 NOTE — Patient Instructions (Signed)
Medication Instructions:  1.Increase entresto 24-26 mg to one whole tablet twice daily *If you need a refill on your cardiac medications before your next appointment, please call your pharmacy*  Lab Work: BMET in 2 weeks If you have labs (blood work) drawn today and your tests are completely normal, you will receive your results only by: [place] (if you have MyChart) OR A paper copy in the mail If you have any lab test that is abnormal or we need to change your treatment, we will call you to review the results.  Follow-Up: At [facility], you and your health needs are our priority.  As part of our continuing mission to provide you with exceptional heart care, we have created designated Provider Care Teams.  These Care Teams include your primary Cardiologist (physician) and Advanced Practice Providers (APPs -  Physician Assistants and Nurse Practitioners) who all work together to provide you with the care you need, when you need it.  Your next appointment:   3 month(s)  Provider:   Legrand Como "Oda Kilts, PA-C

## 2022-11-29 ENCOUNTER — Ambulatory Visit (INDEPENDENT_AMBULATORY_CARE_PROVIDER_SITE_OTHER): Payer: Medicare Other

## 2022-11-29 DIAGNOSIS — I442 Atrioventricular block, complete: Secondary | ICD-10-CM | POA: Diagnosis not present

## 2022-11-29 LAB — CUP PACEART REMOTE DEVICE CHECK
Battery Remaining Longevity: 43 mo
Battery Remaining Percentage: 53 %
Battery Voltage: 2.98 V
Brady Statistic AP VP Percent: 93 %
Brady Statistic AP VS Percent: 1 %
Brady Statistic AS VP Percent: 6.6 %
Brady Statistic AS VS Percent: 1 %
Brady Statistic RA Percent Paced: 93 %
Date Time Interrogation Session: 20240405032830
Implantable Lead Connection Status: 753985
Implantable Lead Connection Status: 753985
Implantable Lead Connection Status: 753985
Implantable Lead Implant Date: 20160329
Implantable Lead Implant Date: 20160329
Implantable Lead Implant Date: 20210406
Implantable Lead Location: 753858
Implantable Lead Location: 753859
Implantable Lead Location: 753860
Implantable Lead Model: 1948
Implantable Pulse Generator Implant Date: 20210406
Lead Channel Impedance Value: 380 Ohm
Lead Channel Impedance Value: 650 Ohm
Lead Channel Impedance Value: 750 Ohm
Lead Channel Pacing Threshold Amplitude: 0.5 V
Lead Channel Pacing Threshold Amplitude: 0.75 V
Lead Channel Pacing Threshold Amplitude: 2.375 V
Lead Channel Pacing Threshold Pulse Width: 0.5 ms
Lead Channel Pacing Threshold Pulse Width: 0.5 ms
Lead Channel Pacing Threshold Pulse Width: 0.5 ms
Lead Channel Sensing Intrinsic Amplitude: 10.8 mV
Lead Channel Sensing Intrinsic Amplitude: 2.7 mV
Lead Channel Setting Pacing Amplitude: 1.5 V
Lead Channel Setting Pacing Amplitude: 2 V
Lead Channel Setting Pacing Amplitude: 2.875
Lead Channel Setting Pacing Pulse Width: 0.5 ms
Lead Channel Setting Pacing Pulse Width: 0.5 ms
Lead Channel Setting Sensing Sensitivity: 4 mV
Pulse Gen Model: 3562
Pulse Gen Serial Number: 6058048

## 2022-12-02 ENCOUNTER — Telehealth: Payer: Self-pay

## 2022-12-02 ENCOUNTER — Ambulatory Visit: Payer: Medicare Other | Attending: Cardiology

## 2022-12-02 DIAGNOSIS — Z950 Presence of cardiac pacemaker: Secondary | ICD-10-CM

## 2022-12-02 DIAGNOSIS — I5022 Chronic systolic (congestive) heart failure: Secondary | ICD-10-CM

## 2022-12-02 NOTE — Progress Notes (Signed)
EPIC Encounter for ICM Monitoring  Patient Name: Andrew Marshall is a 89 y.o. male Date: 12/02/2022 Primary Care Physican: Darrow Bussing, MD Primary Cardiologist: Anne Fu Electrophysiologist: Townsend Roger Pacing: >99%            05/01/2022 Home Weight: 141 lbs 10/30/2022 Weight: 140-143                                                            Attempted call to patient and unable to reach.  Left detailed message per DPR regarding transmission. Transmission reviewed.    CorVue thoracic impedance suggesting possible fluid accumulation starting 4/2 and trending back toward baseline.    Prescribed: No diuretic   Recommendations:  Left voice mail with ICM number and encouraged to call if experiencing any fluid symptoms.   Follow-up plan: ICM clinic phone appointment on 5//2024.  91 day device clinic remote transmission 02/28/2023.     EP/Cardiology Office Visits:  03/11/2023 with Otilio Saber, PA.  Recall 01/19/2023 with Dr Anne Fu.     Copy of ICM check sent to Dr Lalla Brothers.   3 month ICM trend: 12/02/2022.    12-14 Month ICM trend:     Karie Soda, RN 12/02/2022 12:05 PM

## 2022-12-02 NOTE — Telephone Encounter (Signed)
Remote ICM transmission received.  Attempted call to patient regarding ICM remote transmission and left detailed message per DPR.  Advised to return call for any fluid symptoms or questions. Next ICM remote transmission scheduled 01/06/2023.  

## 2022-12-03 DIAGNOSIS — Z79899 Other long term (current) drug therapy: Secondary | ICD-10-CM | POA: Diagnosis not present

## 2022-12-03 DIAGNOSIS — Z950 Presence of cardiac pacemaker: Secondary | ICD-10-CM | POA: Diagnosis not present

## 2022-12-03 DIAGNOSIS — I442 Atrioventricular block, complete: Secondary | ICD-10-CM | POA: Diagnosis not present

## 2022-12-03 DIAGNOSIS — I5022 Chronic systolic (congestive) heart failure: Secondary | ICD-10-CM | POA: Diagnosis not present

## 2022-12-03 DIAGNOSIS — I110 Hypertensive heart disease with heart failure: Secondary | ICD-10-CM | POA: Diagnosis not present

## 2022-12-03 DIAGNOSIS — I2510 Atherosclerotic heart disease of native coronary artery without angina pectoris: Secondary | ICD-10-CM | POA: Diagnosis not present

## 2022-12-03 DIAGNOSIS — E7800 Pure hypercholesterolemia, unspecified: Secondary | ICD-10-CM | POA: Diagnosis not present

## 2022-12-03 DIAGNOSIS — Z0001 Encounter for general adult medical examination with abnormal findings: Secondary | ICD-10-CM | POA: Diagnosis not present

## 2022-12-11 ENCOUNTER — Ambulatory Visit: Payer: Medicare Other | Attending: Student

## 2022-12-11 DIAGNOSIS — I5022 Chronic systolic (congestive) heart failure: Secondary | ICD-10-CM | POA: Diagnosis not present

## 2022-12-11 DIAGNOSIS — I442 Atrioventricular block, complete: Secondary | ICD-10-CM

## 2022-12-11 DIAGNOSIS — I2510 Atherosclerotic heart disease of native coronary artery without angina pectoris: Secondary | ICD-10-CM

## 2022-12-11 DIAGNOSIS — I340 Nonrheumatic mitral (valve) insufficiency: Secondary | ICD-10-CM | POA: Diagnosis not present

## 2022-12-12 LAB — BASIC METABOLIC PANEL
BUN/Creatinine Ratio: 28 — ABNORMAL HIGH (ref 10–24)
BUN: 27 mg/dL (ref 8–27)
CO2: 24 mmol/L (ref 20–29)
Calcium: 8.8 mg/dL (ref 8.6–10.2)
Chloride: 104 mmol/L (ref 96–106)
Creatinine, Ser: 0.96 mg/dL (ref 0.76–1.27)
Glucose: 118 mg/dL — ABNORMAL HIGH (ref 70–99)
Potassium: 4.7 mmol/L (ref 3.5–5.2)
Sodium: 139 mmol/L (ref 134–144)
eGFR: 76 mL/min/{1.73_m2} (ref >59)

## 2022-12-16 ENCOUNTER — Other Ambulatory Visit: Payer: Self-pay

## 2022-12-16 DIAGNOSIS — I5022 Chronic systolic (congestive) heart failure: Secondary | ICD-10-CM

## 2022-12-16 MED ORDER — ATORVASTATIN CALCIUM 40 MG PO TABS: 40.0000 mg | ORAL_TABLET | Freq: Every day | ORAL | 2 refills | Status: DC

## 2022-12-16 MED ORDER — CARVEDILOL 12.5 MG PO TABS: ORAL_TABLET | ORAL | 2 refills | Status: DC

## 2022-12-16 MED ORDER — CLOPIDOGREL BISULFATE 75 MG PO TABS: 75.0000 mg | ORAL_TABLET | Freq: Every day | ORAL | 2 refills | Status: DC

## 2022-12-16 MED ORDER — ENTRESTO 24-26 MG PO TABS: 1.0000 | ORAL_TABLET | Freq: Two times a day (BID) | ORAL | 3 refills | Status: DC

## 2022-12-16 NOTE — Addendum Note (Signed)
Addended by: Margaret Pyle D on: 12/16/2022 07:42 AM   Modules accepted: Orders

## 2022-12-18 DIAGNOSIS — H906 Mixed conductive and sensorineural hearing loss, bilateral: Secondary | ICD-10-CM | POA: Diagnosis not present

## 2022-12-18 DIAGNOSIS — H938X1 Other specified disorders of right ear: Secondary | ICD-10-CM | POA: Diagnosis not present

## 2022-12-18 DIAGNOSIS — H6122 Impacted cerumen, left ear: Secondary | ICD-10-CM | POA: Diagnosis not present

## 2022-12-18 DIAGNOSIS — H7291 Unspecified perforation of tympanic membrane, right ear: Secondary | ICD-10-CM | POA: Diagnosis not present

## 2022-12-27 ENCOUNTER — Encounter: Payer: Medicare Other | Admitting: Cardiology

## 2022-12-31 NOTE — Progress Notes (Signed)
Remote pacemaker transmission.   

## 2023-01-02 DIAGNOSIS — H7291 Unspecified perforation of tympanic membrane, right ear: Secondary | ICD-10-CM | POA: Diagnosis not present

## 2023-01-06 ENCOUNTER — Ambulatory Visit: Payer: Medicare Other | Attending: Cardiology

## 2023-01-06 DIAGNOSIS — I5022 Chronic systolic (congestive) heart failure: Secondary | ICD-10-CM | POA: Diagnosis not present

## 2023-01-06 DIAGNOSIS — Z950 Presence of cardiac pacemaker: Secondary | ICD-10-CM | POA: Diagnosis not present

## 2023-01-07 NOTE — Progress Notes (Signed)
EPIC Encounter for ICM Monitoring  Patient Name: Andrew Marshall is a 89 y.o. male Date: 01/07/2023 Primary Care Physican: Darrow Bussing, MD Primary Cardiologist: Anne Fu Electrophysiologist: Townsend Roger Pacing: >99%            05/01/2022 Home Weight: 141 lbs 10/30/2022 Weight: 140-143 lbs 01/07/2023 Weight: 140-143 lbs                                                            Spoke with patient and heart failure questions reviewed.  Transmission results reviewed.  Pt asymptomatic for fluid accumulation.  Reports feeling well at this time and voices no complaints.   No changes in diet.  Does live in senior type housing that provide meals but most of meals of fried and overcooked. He and his wife try cooking for themselves a couple of times a week.    CorVue thoracic impedance suggesting possible fluid accumulation starting 5/11.    Prescribed: No diuretic   Recommendations:  Advised to monitor and limit salt intake.    Follow-up plan: ICM clinic phone appointment on 01/13/2023 to recheck fluid levels.  91 day device clinic remote transmission 02/28/2023.     EP/Cardiology Office Visits:  03/11/2023 with Otilio Saber, PA.  Recall 01/19/2023 with Dr Anne Fu.     Copy of ICM check sent to Dr Lalla Brothers.   3 month ICM trend: 01/06/2023.    12-14 Month ICM trend:     Karie Soda, RN 01/07/2023 3:32 PM

## 2023-01-13 ENCOUNTER — Ambulatory Visit: Payer: Medicare Other | Attending: Cardiology

## 2023-01-13 DIAGNOSIS — I5022 Chronic systolic (congestive) heart failure: Secondary | ICD-10-CM

## 2023-01-13 DIAGNOSIS — K529 Noninfective gastroenteritis and colitis, unspecified: Secondary | ICD-10-CM | POA: Diagnosis not present

## 2023-01-13 DIAGNOSIS — Z950 Presence of cardiac pacemaker: Secondary | ICD-10-CM

## 2023-01-14 DIAGNOSIS — K529 Noninfective gastroenteritis and colitis, unspecified: Secondary | ICD-10-CM | POA: Diagnosis not present

## 2023-01-14 NOTE — Progress Notes (Signed)
EPIC Encounter for ICM Monitoring  Patient Name: Andrew Marshall is a 89 y.o. male Date: 01/14/2023 Primary Care Physican: Darrow Bussing, MD Primary Cardiologist: Anne Fu Electrophysiologist: Townsend Roger Pacing: >99%            05/01/2022 Home Weight: 141 lbs 10/30/2022 Weight: 140-143 lbs 01/07/2023 Weight: 140-143 lbs                                                            Spoke with patient and heart failure questions reviewed.  Transmission results reviewed.  Pt asymptomatic for fluid accumulation.  Reports feeling well at this time and voices no complaints.      CorVue thoracic impedance suggesting fluid levels returned to normal.    Prescribed: No diuretic   Recommendations:  Advised to monitor and limit salt intake.    Follow-up plan: ICM clinic phone appointment on 02/10/2023.  91 day device clinic remote transmission 02/28/2023.     EP/Cardiology Office Visits:  03/11/2023 with Otilio Saber, PA.  Recall 01/19/2023 with Dr Anne Fu.     Copy of ICM check sent to Dr Lalla Brothers.   3 month ICM trend: 01/13/2023.    12-14 Month ICM trend:     Karie Soda, RN 01/14/2023 1:08 PM

## 2023-01-22 DIAGNOSIS — K529 Noninfective gastroenteritis and colitis, unspecified: Secondary | ICD-10-CM | POA: Diagnosis not present

## 2023-01-22 NOTE — Progress Notes (Unsigned)
Office Visit    Patient Name: Andrew Marshall Date of Encounter: 01/23/2023  PCP:  Darrow Bussing, MD   [facility]  Cardiologist:  Donato Schultz, MD  Advanced Practice Provider:  No care team member to display Electrophysiologist:  Lanier Prude, MD   HPI    Andrew Marshall is a 89 y.o. male with a past medical history of CHB status post PPM, hypertension, PAF, prostate cancer, HLD, chronic systolic CHF due to mixed ICM/NICM presents today for follow-up appointment.  Patient was last seen by Otilio Saber 11/27/2022.  Overall was doing well.  3/15 had a singular episode of dizziness and weakness while standing at the fridge.  He had eaten breakfast, lunch, and taking his medications as normal without warning.  It occurred around 3:30 PM.  He fell down to his knees and did not lose consciousness.  His blood pressure was somewhat labile that morning in the 100s when it usually was running 120s to 130s, even 140s.  He had been taking only half a tab of Entresto for some time.  He denied chest pain, palpitations, dyspnea, PND, orthopnea, nausea, vomiting, syncope, edema, weight gain, early satiety.  Today, he tells me that he had 1 episode of dizziness and weakness and a syncopal episode.  It was after a long bike ride.  Thought to have some component of dehydration.  Presented with blood pressure log today.  No further dizziness or weakness.  Also reviewed his most recent echocardiogram (last year).  Maintains an impressive level of exercise.  Discussed low-sodium, heart healthy diet. Otherwise doing okay.  Reports no shortness of breath nor dyspnea on exertion. Reports no chest pain, pressure, or tightness. No orthopnea, PND. Reports no palpitations.   Past Medical History    Past Medical History:  Diagnosis Date   Allergic rhinitis    Arthritis    "thumbs primarily" (05/05/2015)   Basal cell carcinoma, face    "have had several; cut and burned off"   CAD (coronary  artery disease)    a. 2001 s/p post MI- multiple PCI's to LCX/OM;  b. 2003 DES to OM;  c. 05/2012 MV: postlat infarct w/ mild peri-infarct ischemia. nl EF->Med Rx.   Complete heart block (HCC)    St. Jude pacemaker implanted 11/22/14   ED (erectile dysfunction)    Esophageal reflux    "slight"   Essential hypertension, benign    History of atrial fibrillation    History of GI diverticular bleed    a. 05/2013 and 02/2014 - taken off of ASA for this reason.   Metatarsalgia    MI (myocardial infarction) (HCC) 2001   Moderate mitral insufficiency    a. 10/2014 Echo: EF 55-60%, Gr1 DD, mild AS, mod MR, sev dil LA, nl RV, mildly dil RA, mild TR, PASP .   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   Prostate cancer (HCC) 2008   S/P "radiation and seed implants"   Pure hypercholesterolemia    RLS (restless legs syndrome)    Past Surgical History:  Procedure Laterality Date   APPENDECTOMY  1956   BASAL CELL CARCINOMA EXCISION     "face"   BIV UPGRADE N/A 11/30/2019   Procedure: BIV PPM UPGRADE;  Surgeon: Hillis Range, MD;  Location: [facility] INVASIVE CV LAB;  Service: Cardiovascular;  Laterality: N/A;   CARDIAC CATHETERIZATION N/A 05/05/2015   Procedure: Left  Heart Cath and Coronary Angiography;  Surgeon: Lyn Records, MD;  Location: [facility] INVASIVE CV LAB;  Service: Cardiovascular;  Laterality: N/A;   CARDIAC CATHETERIZATION N/A 05/05/2015   Procedure: Coronary Stent Intervention;  Surgeon: Lyn Records, MD;  Location: [facility] INVASIVE CV LAB;  Service: Cardiovascular;  Laterality: N/A;   CATARACT EXTRACTION W/ INTRAOCULAR LENS IMPLANT Left 12/2014   COLONOSCOPY W/ POLYPECTOMY     CORONARY ANGIOPLASTY     CORONARY ANGIOPLASTY WITH STENT PLACEMENT  04/2000; ? ~ 2002; 01/2002   ESTERNAL BEAM XRT AND SEEDS FOR PROSTATE CANCER     EYE SURGERY Right 07/2016   FOOT ARTHRODESIS, TRIPLE Right 2007   GANGLION CYST EXCISION Right 1970's    wrist   INGUINAL HERNIA REPAIR Right 09/17/2022   Procedure: LAPAROSCOPIC RIGHT INGUINAL HERNIA REPAIR WITH MESH;  Surgeon: Axel Filler, MD;  Location: [facility] OR;  Service: General;  Laterality: Right;   INSERT / REPLACE / REMOVE PACEMAKER  2017   INSERTION OF MESH Right 09/17/2022   Procedure: INSERTION OF MESH;  Surgeon: Axel Filler, MD;  Location: [facility] OR;  Service: General;  Laterality: Right;   LEFT HEART CATH AND CORONARY ANGIOGRAPHY N/A 09/19/2017   Procedure: LEFT HEART CATH AND CORONARY ANGIOGRAPHY;  Surgeon: Kathleene Hazel, MD;  Location: [facility] INVASIVE CV LAB;  Service: Cardiovascular;  Laterality: N/A;   LUMBAR LAMINECTOMY/DECOMPRESSION MICRODISCECTOMY Left 09/18/2016   Procedure: LEFT LUMBAR THREE- LUMBAR FOUR LUMBAR LAMINOTOMY AND MICRODISCECTOMY;  Surgeon: Shirlean Kelly, MD;  Location: [facility] OR;  Service: Neurosurgery;  Laterality: Left;  LEFT LUMBAR 3- LUMBAR 4 LUMBAR LAMINOTOMY AND MICRODISCECTOMY   PERMANENT PACEMAKER INSERTION N/A 11/22/2014   Procedure: PERMANENT PACEMAKER INSERTION;  Surgeon: Hillis Range, MD;  Location: [facility] CATH LAB;  Service: Cardiovascular;  Laterality: N/A;   PROSTATE BIOPSY     TONSILLECTOMY  1942    Allergies  Allergies  Allergen Reactions   Ambien [Zolpidem] Other (See Comments)    Sleep walk    Erythromycin Nausea And Vomiting and Other (See Comments)    EKGs/Labs/Other Studies Reviewed:   The following studies were reviewed today: Cardiac Studies & Procedures   CARDIAC CATHETERIZATION  CARDIAC CATHETERIZATION 09/19/2017  Narrative  Prox RCA to Mid RCA lesion is 30% stenosed.  Prox Cx to Mid Cx lesion is 20% stenosed.  Ost 2nd Mrg lesion is 70% stenosed.  Prox LAD lesion is 20% stenosed.  1. Single vessel CAD 2. Patent stents proximal and mid Circumflex with mild restenosis. 3. The small caliber obtuse marginal branch is jailed by the Circumflex stent. The ostium of the obtuse marginal branch has a 70-80% stenosis which is  unchanged from 2016 at the time of deployment of the circumflex stent. 4. Mild non-obstructive disease in the large, dominant RCA and in the LAD which reaches the apex. 5. Mild elevation LV filling pressures  Recommendations: Continue medical management of CAD. Consider low dose lasix given elevated filling pressures.  Findings Coronary Findings Diagnostic  Dominance: Right  Left Anterior Descending Vessel is large. Prox LAD lesion is 20% stenosed.  Left Circumflex Vessel is large. Prox Cx to Mid Cx lesion is 20% stenosed. The lesion was previously treated using a drug eluting stent over 2 years ago.  Second Obtuse Marginal Branch Vessel is moderate in size. Ost 2nd Mrg lesion is 70% stenosed.  Third Obtuse Marginal Branch Vessel is small in size.  Right Coronary Artery Vessel is large. Prox RCA to Mid RCA lesion is 30% stenosed.  Intervention  No  interventions have been documented.   CARDIAC CATHETERIZATION  CARDIAC CATHETERIZATION 05/05/2015  Narrative 1. Prox LAD to Mid LAD lesion, 25% stenosed. 2. Prox Cx lesion, 90% stenosed. 3. Prox Cx to Mid Cx lesion, 90% stenosed. There is a 20% residual stenosis post intervention. The lesion was previously treated with a stent (unknown type) . 4. A drug-eluting stent was placed. 5. Prox RCA to Mid RCA lesion, 30% stenosed.   High-grade obstruction of the proximal circumflex within the treatment zone of prior Brachy therapy  (2003) and just proximal to the previously placed stents. This stenosis accounts for the patient's presentation. Otherwise widely patent coronary arteries.  Successful PTCA and stenting of the proximal circumflex from greater than 90% to less than 20%. Preservation of the large first obtuse marginal side branch.  Mildly depressed LV systolic function, EF 45%.   Recommendations:   Prolonged dual antiplatelet therapy given prior history of brachytherapy.   Eligible for discharge in a.m. if no  problem.  Findings Coronary Findings Diagnostic  Dominance: Right  Left Anterior Descending eccentric .  First Septal Branch The vessel is small in size.  Second Septal Branch The vessel is small in size.  Third Septal Branch The vessel is small in size.  Left Circumflex The lesion is type C eccentric .  Prior brachytherapy The lesion was previously treated with a stent (unknown type) .  Second Obtuse Marginal Branch The vessel is small in size.  Third Obtuse Marginal Branch The vessel is small in size.  Right Coronary Artery  Right Posterior Descending Artery The vessel is small in size.  Intervention  Prox Cx lesion PCI (Also treats lesions: Prox Cx to Mid Cx) The pre-interventional distal flow is normal (TIMI 3). Pre-stent angioplasty was performed. A drug-eluting stent was placed. The strut is apposed. Post-stent angioplasty was performed. Maximum pressure: 16 atm. The post-interventional distal flow is normal (TIMI 3). The intervention was successful. No complications occurred at this lesion. There is a 20% residual stenosis post intervention.  Prox Cx to Mid Cx lesion PCI (Also treats lesions: Prox Cx) See details in Prox Cx lesion. There is a 20% residual stenosis post intervention.     ECHOCARDIOGRAM  ECHOCARDIOGRAM COMPLETE 02/13/2022  Narrative ECHOCARDIOGRAM REPORT    Patient Name:   Andrew Marshall Date of Exam: 02/13/2022 Medical Rec #:  [id number]     Height:       69.0 in Accession #:    [id number]    Weight:       151.4 lb Date of Birth:  [birth year]     BSA:          1.835 m Patient Age:    89 years      BP:           102/58 mmHg Patient Gender: M             HR:           87 bpm. Exam Location:  [address]  Procedure: 2D Echo, 3D Echo, Cardiac Doppler, Color Doppler and Strain Analysis  Indications:    I50.22 CHF  History:        Patient has prior history of Echocardiogram examinations, most recent 06/12/2020. CHF and Cardiomyopathy,  CAD and Previous Myocardial Infarction, Pacemaker, Arrythmias:Atrial Fibrillation; Risk Factors:Family History of Coronary Artery Disease, Hypertension, Dyslipidemia and Former Smoker. Ischemic Cardiomyopathy (Prior EF 35-40%).  Sonographer:    Farrel Conners RDCS Referring Phys: Dyann Kief  IMPRESSIONS   1. Left  ventricular ejection fraction, by estimation, is 35 to 40%. The left ventricle has moderately decreased function. The left ventricle demonstrates regional wall motion abnormalities (see scoring diagram/findings for description). There is moderate eccentric left ventricular hypertrophy of the septal segment. Left ventricular diastolic parameters are consistent with Grade II diastolic dysfunction (pseudonormalization). Elevated left ventricular end-diastolic pressure. There is moderate hypokinesis of the left ventricular, basal-mid inferior wall. There is akinesis of the left ventricular, basal-mid anterolateral wall and inferolateral wall. 2. Right ventricular systolic function is normal. The right ventricular size is mildly enlarged. There is normal pulmonary artery systolic pressure. The estimated right ventricular systolic pressure is 24.2 mmHg. 3. Left atrial size was severely dilated. 4. Right atrial size was moderately dilated. 5. A small pericardial effusion is present. The pericardial effusion is circumferential. 6. The mitral valve is abnormal. Moderate mitral valve regurgitation. No evidence of mitral stenosis. Moderate mitral annular calcification. 7. The aortic valve is tricuspid. There is moderate calcification of the aortic valve. There is moderate thickening of the aortic valve. Aortic valve regurgitation is not visualized. Mild aortic valve stenosis. 8. The inferior vena cava is normal in size with greater than 50% respiratory variability, suggesting right atrial pressure of 3 mmHg.  Comparison(s): No significant change from prior study.  FINDINGS Left  Ventricle: Left ventricular ejection fraction, by estimation, is 35 to 40%. The left ventricle has moderately decreased function. The left ventricle demonstrates regional wall motion abnormalities. Moderate hypokinesis of the left ventricular, basal-mid inferior wall. Global longitudinal strain performed but not reported based on interpreter judgement due to suboptimal tracking. 3D left ventricular ejection fraction analysis performed but not reported based on interpreter judgement due to suboptimal tracking. The left ventricular internal cavity size was normal in size. There is moderate eccentric left ventricular hypertrophy of the septal segment. Left ventricular diastolic parameters are consistent with Grade II diastolic dysfunction (pseudonormalization). Elevated left ventricular end-diastolic pressure.  Right Ventricle: The right ventricular size is mildly enlarged. Right vetricular wall thickness was not well visualized. Right ventricular systolic function is normal. There is normal pulmonary artery systolic pressure. The tricuspid regurgitant velocity is 2.30 m/s, and with an assumed right atrial pressure of 3 mmHg, the estimated right ventricular systolic pressure is 24.2 mmHg.  Left Atrium: Left atrial size was severely dilated.  Right Atrium: Right atrial size was moderately dilated.  Pericardium: A small pericardial effusion is present. The pericardial effusion is circumferential.  Mitral Valve: The mitral valve is abnormal. Moderate mitral annular calcification. Moderate mitral valve regurgitation. No evidence of mitral valve stenosis.  Tricuspid Valve: The tricuspid valve is normal in structure. Tricuspid valve regurgitation is trivial. No evidence of tricuspid stenosis.  Aortic Valve: The aortic valve is tricuspid. There is moderate calcification of the aortic valve. There is moderate thickening of the aortic valve. Aortic valve regurgitation is not visualized. Mild aortic stenosis is  present. Aortic valve mean gradient measures 7.6 mmHg. Aortic valve peak gradient measures 13.6 mmHg. Aortic valve area, by VTI measures 2.37 cm.  Pulmonic Valve: The pulmonic valve was not well visualized. Pulmonic valve regurgitation is mild to moderate. No evidence of pulmonic stenosis.  Aorta: The aortic root, ascending aorta and aortic arch are all structurally normal, with no evidence of dilitation or obstruction.  Venous: The inferior vena cava is normal in size with greater than 50% respiratory variability, suggesting right atrial pressure of 3 mmHg.  IAS/Shunts: The atrial septum is grossly normal.  Additional Comments: A device lead is visualized.  LEFT VENTRICLE PLAX 2D LVIDd:         4.45 cm   Diastology LVIDs:         3.47 cm   LV e' medial:    3.92 cm/s LV PW:         0.80 cm   LV E/e' medial:  29.7 LV IVS:        1.50 cm   LV e' lateral:   4.57 cm/s LVOT diam:     2.60 cm   LV E/e' lateral: 25.4 LV SV:         106 LV SV Index:   58        2D Longitudinal Strain LVOT Area:     5.31 cm  2D Strain GLS (A2C):   -15.2 % 2D Strain GLS (A3C):   -15.6 % 2D Strain GLS (A4C):   -15.7 % 2D Strain GLS Avg:     -15.5 %  3D Volume EF: 3D EF:        52 % LV EDV:       170 ml LV ESV:       82 ml LV SV:        88 ml  RIGHT VENTRICLE RV Basal diam:  4.10 cm RV Mid diam:    3.30 cm RV S prime:     22.20 cm/s TAPSE (M-mode): 2.1 cm  LEFT ATRIUM              Index        RIGHT ATRIUM           Index LA diam:        5.20 cm  2.83 cm/m   RA Area:     22.70 cm LA Vol (A2C):   99.6 ml  54.26 ml/m  RA Volume:   73.90 ml  40.26 ml/m LA Vol (A4C):   119.0 ml 64.83 ml/m LA Biplane Vol: 119.0 ml 64.83 ml/m AORTIC VALVE AV Area (Vmax):    1.96 cm AV Area (Vmean):   1.95 cm AV Area (VTI):     2.37 cm AV Vmax:           184.40 cm/s AV Vmean:          128.800 cm/s AV VTI:            0.447 m AV Peak Grad:      13.6 mmHg AV Mean Grad:      7.6 mmHg LVOT Vmax:          68.23 cm/s LVOT Vmean:        47.267 cm/s LVOT VTI:          0.199 m LVOT/AV VTI ratio: 0.45  AORTA Ao Root diam: 3.60 cm Ao Asc diam:  3.70 cm  MITRAL VALVE                 TRICUSPID VALVE MV Area (PHT): cm           TR Peak grad:   21.2 mmHg MV Decel Time: 273 msec      TR Vmax:        230.00 cm/s MR Peak grad:   143.8 mmHg MR Mean grad:   85.3 mmHg    SHUNTS MR Vmax:        599.67 cm/s  Systemic VTI:  0.20 m MR Vmean:       430.3 cm/s   Systemic Diam: 2.60 cm MR PISA:  2.26 cm MR PISA Radius: 0.60 cm MV E velocity: 116.30 cm/s MV A velocity: 108.00 cm/s MV E/A ratio:  1.08  Jodelle Red MD Electronically signed by Jodelle Red MD Signature Date/Time: 02/13/2022/4:59:14 PM    Final              EKG:  EKG is not ordered today.    Recent Labs: 01/23/2022: ALT 20; TSH 1.030 09/17/2022: Hemoglobin 12.2; Platelets 157 12/11/2022: BUN 27; Creatinine, Ser 0.96; Potassium 4.7; Sodium 139  Recent Lipid Panel    Component Value Date/Time   CHOL 121 05/06/2019 1109   TRIG 60 05/06/2019 1109   HDL 58 05/06/2019 1109   CHOLHDL 2.1 05/06/2019 1109   CHOLHDL 3 04/12/2014 0951   VLDL 21.8 04/12/2014 0951   LDLCALC 50 05/06/2019 1109    Home Medications   Current Meds  Medication Sig   atorvastatin (LIPITOR) 40 MG tablet Take 1 tablet (40 mg total) by mouth daily.   Carboxymethylcellul-Glycerin (LUBRICATING EYE DROPS OP) Place 1 drop into both eyes daily as needed (dry eyes).   carvedilol (COREG) 12.5 MG tablet TAKE ONE TABLET BY MOUTH TWO TIMES A DAY WITH A MEAL   cetirizine (ZYRTEC) 10 MG tablet Take 10 mg by mouth daily.   Cholecalciferol (VITAMIN D3) 50 MCG (2000 UT) capsule Take 2,000 Units by mouth daily.   clopidogrel (PLAVIX) 75 MG tablet Take 1 tablet (75 mg total) by mouth daily.   cyanocobalamin (VITAMIN B12) 500 MCG tablet Take 500 mcg by mouth daily.   sacubitril-valsartan (ENTRESTO) 24-26 MG Take 1 tablet by mouth 2 (two) times daily.      Review of Systems      All other systems reviewed and are otherwise negative except as noted above.  Physical Exam    VS:  BP (!) 100/58   Pulse 68   Ht 5\' 8"  (1.727 m)   Wt 149 lb 3.2 oz (67.7 kg)   SpO2 97%   BMI 22.69 kg/m  , BMI Body mass index is 22.69 kg/m.  Wt Readings from Last 3 Encounters:  01/23/23 149 lb 3.2 oz (67.7 kg)  11/27/22 152 lb (68.9 kg)  09/17/22 140 lb (63.5 kg)     GEN: Well nourished, well developed, in no acute distress. HEENT: normal. Neck: Supple, no JVD, carotid bruits, or masses. Cardiac: RRR, no murmurs, rubs, or gallops. No clubbing, cyanosis, 1-2+ bilateral LE pitting edema.  Radials/PT 2+ and equal bilaterally.  Respiratory:  Respirations regular and unlabored, clear to auscultation bilaterally. GI: Soft, nontender, nondistended. MS: No deformity or atrophy. Skin: Warm and dry, no rash. Neuro:  Strength and sensation are intact. Psych: Normal affect.  Assessment & Plan    CHB status post PPM -PPM has been stable -no recent issues -continue EP follow-up  Chronic systolic CHF status post BiV upgrade -some LE edema today on exam -no SOB -continue current medications: Atorvastatin 40 mg daily, carvedilol 12.5 mg twice a day, Plavix 75 mg daily, Entresto 24-26 mg twice a day  PAF, subclinical - no recent issues, NSR today  HTN -Low normal today 100/58 -Patient feels well with no recent dizziness or lightheadedness -He meticulously keeps track of his blood pressure on a daily basis -Encouraged to continue to do so  Syncope/lightheadedness -no issues since one isolated occurrence -occurred after exercise and thought to be due to dehydration  CAD -stable with no new chest pain -Not on aspirin because he is taking Plavix 75 mg daily (no issues with bleeding  but does bruise easily)  Moderate MR -next year plan to repeat echo -some LE edema but treating conservatively with diet and compression, elevation     Disposition:  Follow up 1 year with Donato Schultz, MD or APP.  Signed, Sharlene Dory, PA-C 01/23/2023, 12:05 PM [facility]

## 2023-01-23 ENCOUNTER — Ambulatory Visit: Payer: Medicare Other | Admitting: Physician Assistant

## 2023-01-23 ENCOUNTER — Encounter: Payer: Self-pay | Admitting: Physician Assistant

## 2023-01-23 ENCOUNTER — Ambulatory Visit: Payer: Medicare Other | Attending: Physician Assistant | Admitting: Physician Assistant

## 2023-01-23 VITALS — BP 100/58 | HR 68 | Ht 68.0 in | Wt 149.2 lb

## 2023-01-23 DIAGNOSIS — I5022 Chronic systolic (congestive) heart failure: Secondary | ICD-10-CM

## 2023-01-23 DIAGNOSIS — I340 Nonrheumatic mitral (valve) insufficiency: Secondary | ICD-10-CM

## 2023-01-23 DIAGNOSIS — I2510 Atherosclerotic heart disease of native coronary artery without angina pectoris: Secondary | ICD-10-CM | POA: Diagnosis not present

## 2023-01-23 DIAGNOSIS — I442 Atrioventricular block, complete: Secondary | ICD-10-CM

## 2023-01-23 DIAGNOSIS — Z950 Presence of cardiac pacemaker: Secondary | ICD-10-CM | POA: Diagnosis not present

## 2023-01-23 DIAGNOSIS — E785 Hyperlipidemia, unspecified: Secondary | ICD-10-CM | POA: Diagnosis not present

## 2023-01-23 DIAGNOSIS — I255 Ischemic cardiomyopathy: Secondary | ICD-10-CM | POA: Diagnosis not present

## 2023-01-23 DIAGNOSIS — I428 Other cardiomyopathies: Secondary | ICD-10-CM | POA: Diagnosis not present

## 2023-01-23 NOTE — Patient Instructions (Signed)
Medication Instructions:    Your physician recommends that you continue on your current medications as directed. Please refer to the Current Medication list given to you today.   *If you need a refill on your cardiac medications before your next appointment, please call your pharmacy*   Lab Work: NONE ORDERED  TODAY    If you have labs (blood work) drawn today and your tests are completely normal, you will receive your results only by: MyChart Message (if you have MyChart) OR A paper copy in the mail If you have any lab test that is abnormal or we need to change your treatment, we will call you to review the results.   Testing/Procedures: NONE ORDERED  TODAY    Follow-Up: At [facility], you and your health needs are our priority.  As part of our continuing mission to provide you with exceptional heart care, we have created designated Provider Care Teams.  These Care Teams include your primary Cardiologist (physician) and Advanced Practice Providers (APPs -  Physician Assistants and Nurse Practitioners) who all work together to provide you with the care you need, when you need it.  We recommend signing up for the patient portal called "MyChart".  Sign up information is provided on this After Visit Summary.  MyChart is used to connect with patients for Virtual Visits (Telemedicine).  Patients are able to view lab/test results, encounter notes, upcoming appointments, etc.  Non-urgent messages can be sent to your provider as well.   To learn more about what you can do with MyChart, go to [email or web address].    Your next appointment:   1 year(s)  Provider:   Donato Schultz, MD     Other Instructions Heart-Healthy Eating Plan Eating a healthy diet is important for the health of your heart. A heart-healthy eating plan includes: Eating less unhealthy fats. Eating more healthy fats. Eating less salt in your food. Salt is also called sodium. Making other changes in your  diet. Talk with your doctor or a diet specialist (dietitian) to create an eating plan that is right for you. What is my plan? Your doctor may recommend an eating plan that includes: Total fat: ______% or less of total calories a day. Saturated fat: ______% or less of total calories a day. Cholesterol: less than _________mg a day. Sodium: less than _________mg a day. What are tips for following this plan? Cooking Avoid frying your food. Try to bake, boil, grill, or broil it instead. You can also reduce fat by: Removing the skin from poultry. Removing all visible fats from meats. Steaming vegetables in water or broth. Meal planning  At meals, divide your plate into four equal parts: Fill one-half of your plate with vegetables and green salads. Fill one-fourth of your plate with whole grains. Fill one-fourth of your plate with lean protein foods. Eat 2-4 cups of vegetables per day. One cup of vegetables is: 1 cup (91 g) broccoli or cauliflower florets. 2 medium carrots. 1 large bell pepper. 1 large sweet potato. 1 large tomato. 1 medium white potato. 2 cups (150 g) raw leafy greens. Eat 1-2 cups of fruit per day. One cup of fruit is: 1 small apple 1 large banana 1 cup (237 g) mixed fruit, 1 large orange,  cup (82 g) dried fruit, 1 cup (240 mL) 100% fruit juice. Eat more foods that have soluble fiber. These are apples, broccoli, carrots, beans, peas, and barley. Try to get 20-30 g of fiber per day. Eat 4-5 servings of  nuts, legumes, and seeds per week: 1 serving of dried beans or legumes equals  cup (90 g) cooked. 1 serving of nuts is  oz (12 almonds, 24 pistachios, or 7 walnut halves). 1 serving of seeds equals  oz (8 g). General information Eat more home-cooked food. Eat less restaurant, buffet, and fast food. Limit or avoid alcohol. Limit foods that are high in starch and sugar. Avoid fried foods. Lose weight if you are overweight. Keep track of how much salt  (sodium) you eat. This is important if you have high blood pressure. Ask your doctor to tell you more about this. Try to add vegetarian meals each week. Fats Choose healthy fats. These include olive oil and canola oil, flaxseeds, walnuts, almonds, and seeds. Eat more omega-3 fats. These include salmon, mackerel, sardines, tuna, flaxseed oil, and ground flaxseeds. Try to eat fish at least 2 times each week. Check food labels. Avoid foods with trans fats or high amounts of saturated fat. Limit saturated fats. These are often found in animal products, such as meats, butter, and cream. These are also found in plant foods, such as palm oil, palm kernel oil, and coconut oil. Avoid foods with partially hydrogenated oils in them. These have trans fats. Examples are stick margarine, some tub margarines, cookies, crackers, and other baked goods. What foods should I eat? Fruits All fresh, canned (in natural juice), or frozen fruits. Vegetables Fresh or frozen vegetables (raw, steamed, roasted, or grilled). Green salads. Grains Most grains. Choose whole wheat and whole grains most of the time. Rice and pasta, including brown rice and pastas made with whole wheat. Meats and other proteins Lean, well-trimmed beef, veal, pork, and lamb. Chicken and [place] without skin. All fish and shellfish. Wild duck, rabbit, pheasant, and venison. Egg whites or low-cholesterol egg substitutes. Dried beans, peas, lentils, and tofu. Seeds and most nuts. Dairy Low-fat or nonfat cheeses, including ricotta and mozzarella. Skim or 1% milk that is liquid, powdered, or evaporated. Buttermilk that is made with low-fat milk. Nonfat or low-fat yogurt. Fats and oils Non-hydrogenated (trans-free) margarines. Vegetable oils, including soybean, sesame, sunflower, olive, peanut, safflower, corn, canola, and cottonseed. Salad dressings or mayonnaise made with a vegetable oil. Beverages Mineral water. Coffee and tea. Diet carbonated  beverages. Sweets and desserts Sherbet, gelatin, and fruit ice. Small amounts of dark chocolate. Limit all sweets and desserts. Seasonings and condiments All seasonings and condiments. The items listed above may not be a complete list of foods and drinks you can eat. Contact a dietitian for more options. What foods should I avoid? Fruits Canned fruit in heavy syrup. Fruit in cream or butter sauce. Fried fruit. Limit coconut. Vegetables Vegetables cooked in cheese, cream, or butter sauce. Fried vegetables. Grains Breads that are made with saturated or trans fats, oils, or whole milk. Croissants. Sweet rolls. Donuts. High-fat crackers, such as cheese crackers. Meats and other proteins Fatty meats, such as hot dogs, ribs, sausage, bacon, rib-eye roast or steak. High-fat deli meats, such as salami and bologna. Caviar. Domestic duck and goose. Organ meats, such as liver. Dairy Cream, sour cream, cream cheese, and creamed cottage cheese. Whole-milk cheeses. Whole or 2% milk that is liquid, evaporated, or condensed. Whole buttermilk. Cream sauce or high-fat cheese sauce. Yogurt that is made from whole milk. Fats and oils Meat fat, or shortening. Cocoa butter, hydrogenated oils, palm oil, coconut oil, palm kernel oil. Solid fats and shortenings, including bacon fat, salt pork, lard, and butter. Nondairy cream substitutes. Salad dressings with  cheese or sour cream. Beverages Regular sodas and juice drinks with added sugar. Sweets and desserts Frosting. Pudding. Cookies. Cakes. Pies. Milk chocolate or white chocolate. Buttered syrups. Full-fat ice cream or ice cream drinks. The items listed above may not be a complete list of foods and drinks to avoid. Contact a dietitian for more information. Summary Heart-healthy meal planning includes eating less unhealthy fats, eating more healthy fats, and making other changes in your diet. Eat a balanced diet. This includes fruits and vegetables, low-fat or  nonfat dairy, lean protein, nuts and legumes, whole grains, and heart-healthy oils and fats. This information is not intended to replace advice given to you by your health care provider. Make sure you discuss any questions you have with your health care provider. Document Revised: 09/17/2021 Document Reviewed: 09/17/2021 Elsevier Patient Education  2024 Elsevier Inc.  Low-Sodium Eating Plan Salt (sodium) helps you keep a healthy balance of fluids in your body. Too much sodium can raise your blood pressure. It can also cause fluid and waste to be held in your body. Your health care provider or dietitian may recommend a low-sodium eating plan if you have high blood pressure (hypertension), kidney disease, liver disease, or heart failure. Eating less sodium can help lower your blood pressure and reduce swelling. It can also protect your heart, liver, and kidneys. What are tips for following this plan? Reading food labels  Check food labels for the amount of sodium per serving. If you eat more than one serving, you must multiply the listed amount by the number of servings. Choose foods with less than 140 milligrams (mg) of sodium per serving. Avoid foods with 300 mg of sodium or more per serving. Always check how much sodium is in a product, even if the label says "unsalted" or "no salt added." Shopping  Buy products labeled as "low-sodium" or "no salt added." Buy fresh foods. Avoid canned foods and pre-made or frozen meals. Avoid canned, cured, or processed meats. Buy breads that have less than 80 mg of sodium per slice. Cooking  Eat more home-cooked food. Try to eat less restaurant, buffet, and fast food. Try not to add salt when you cook. Use salt-free seasonings or herbs instead of table salt or sea salt. Check with your provider or pharmacist before using salt substitutes. Cook with plant-based oils, such as canola, sunflower, or olive oil. Meal planning When eating at a restaurant, ask  if your food can be made with less salt or no salt. Avoid dishes labeled as brined, pickled, cured, or smoked. Avoid dishes made with soy sauce, miso, or teriyaki sauce. Avoid foods that have monosodium glutamate (MSG) in them. MSG may be added to some restaurant food, sauces, soups, bouillon, and canned foods. Make meals that can be grilled, baked, poached, roasted, or steamed. These are often made with less sodium. General information Try to limit your sodium intake to 1,500-2,300 mg each day, or the amount told by your provider. What foods should I eat? Fruits Fresh, frozen, or canned fruit. Fruit juice. Vegetables Fresh or frozen vegetables. "No salt added" canned vegetables. "No salt added" tomato sauce and paste. Low-sodium or reduced-sodium tomato and vegetable juice. Grains Low-sodium cereals, such as oats, puffed wheat and rice, and shredded wheat. Low-sodium crackers. Unsalted rice. Unsalted pasta. Low-sodium bread. Whole grain breads and whole grain pasta. Meats and other proteins Fresh or frozen meat, poultry, seafood, and fish. These should have no added salt. Low-sodium canned tuna and salmon. Unsalted nuts. Dried peas,  beans, and lentils without added salt. Unsalted canned beans. Eggs. Unsalted nut butters. Dairy Milk. Soy milk. Cheese that is naturally low in sodium, such as ricotta cheese, fresh mozzarella, or Swiss cheese. Low-sodium or reduced-sodium cheese. Cream cheese. Yogurt. Seasonings and condiments Fresh and dried herbs and spices. Salt-free seasonings. Low-sodium mustard and ketchup. Sodium-free salad dressing. Sodium-free light mayonnaise. Fresh or refrigerated horseradish. Lemon juice. Vinegar. Other foods Homemade, reduced-sodium, or low-sodium soups. Unsalted popcorn and pretzels. Low-salt or salt-free chips. The items listed above may not be all the foods and drinks you can have. Talk to a dietitian to learn more. What foods should I  avoid? Vegetables Sauerkraut, pickled vegetables, and relishes. Olives. [place] fries. Onion rings. Regular canned vegetables, except low-sodium or reduced-sodium items. Regular canned tomato sauce and paste. Regular tomato and vegetable juice. Frozen vegetables in sauces. Grains Instant hot cereals. Bread stuffing, pancake, and biscuit mixes. Croutons. Seasoned rice or pasta mixes. Noodle soup cups. Boxed or frozen macaroni and cheese. Regular salted crackers. Self-rising flour. Meats and other proteins Meat or fish that is salted, canned, smoked, spiced, or pickled. Precooked or cured meat, such as sausages or meat loaves. Tomasa Blase. Ham. Pepperoni. Hot dogs. Corned beef. Chipped beef. Salt pork. Jerky. Pickled herring, anchovies, and sardines. Regular canned tuna. Salted nuts. Dairy Processed cheese and cheese spreads. Hard cheeses. Cheese curds. Blue cheese. Feta cheese. String cheese. Regular cottage cheese. Buttermilk. Canned milk. Fats and oils Salted butter. Regular margarine. Ghee. Bacon fat. Seasonings and condiments Onion salt, garlic salt, seasoned salt, table salt, and sea salt. Canned and packaged gravies. Worcestershire sauce. Tartar sauce. Barbecue sauce. Teriyaki sauce. Soy sauce, including reduced-sodium soy sauce. Steak sauce. Fish sauce. Oyster sauce. Cocktail sauce. Horseradish that you find on the shelf. Regular ketchup and mustard. Meat flavorings and tenderizers. Bouillon cubes. Hot sauce. Pre-made or packaged marinades. Pre-made or packaged taco seasonings. Relishes. Regular salad dressings. Salsa. Other foods Salted popcorn and pretzels. Corn chips and puffs. Potato and tortilla chips. Canned or dried soups. Pizza. Frozen entrees and pot pies. The items listed above may not be all the foods and drinks you should avoid. Talk to a dietitian to learn more. This information is not intended to replace advice given to you by your health care provider. Make sure you discuss any  questions you have with your health care provider. Document Revised: 08/29/2022 Document Reviewed: 08/29/2022 Elsevier Patient Education  2024 [employer].

## 2023-01-30 ENCOUNTER — Other Ambulatory Visit: Payer: Self-pay

## 2023-01-30 MED ORDER — CLOPIDOGREL BISULFATE 75 MG PO TABS: 75.0000 mg | ORAL_TABLET | Freq: Every day | ORAL | 3 refills | Status: DC

## 2023-02-10 ENCOUNTER — Ambulatory Visit: Payer: Medicare Other | Attending: Cardiology

## 2023-02-10 DIAGNOSIS — Z950 Presence of cardiac pacemaker: Secondary | ICD-10-CM

## 2023-02-10 DIAGNOSIS — I5022 Chronic systolic (congestive) heart failure: Secondary | ICD-10-CM | POA: Diagnosis not present

## 2023-02-14 NOTE — Progress Notes (Signed)
EPIC Encounter for ICM Monitoring  Patient Name: Andrew Marshall is a 89 y.o. male Date: 02/14/2023 Primary Care Physican: Darrow Bussing, MD Primary Cardiologist: Anne Fu Electrophysiologist: Townsend Roger Pacing: >99%            05/01/2022 Home Weight: 141 lbs 10/30/2022 Weight: 140-143 lbs 01/07/2023 Weight: 140-143 lbs                                                            Spoke with patient and heart failure questions reviewed.  Transmission results reviewed.  Pt asymptomatic for fluid accumulation.  Reports feeling well at this time and voices no complaints.   Reports BP is low and will discuss with Dr Anne Fu.    CorVue thoracic impedance suggesting normal fluid levels.    Prescribed: No diuretic   Recommendations:  Advised to monitor and limit salt intake.    Follow-up plan: ICM clinic phone appointment on 03/17/2023.  91 day device clinic remote transmission 02/28/2023.     EP/Cardiology Office Visits:  03/11/2023 with Otilio Saber, PA.  Recall 01/19/2023 with Dr Anne Fu.     Copy of ICM check sent to Dr Lalla Brothers.   3 month ICM trend: 02/10/2023.    12-14 Month ICM trend:     Karie Soda, RN 02/14/2023 4:04 PM

## 2023-02-28 ENCOUNTER — Ambulatory Visit (INDEPENDENT_AMBULATORY_CARE_PROVIDER_SITE_OTHER): Payer: Medicare Other

## 2023-02-28 DIAGNOSIS — I428 Other cardiomyopathies: Secondary | ICD-10-CM | POA: Diagnosis not present

## 2023-02-28 LAB — CUP PACEART REMOTE DEVICE CHECK
Battery Remaining Longevity: 38 mo
Battery Remaining Percentage: 50 %
Battery Voltage: 2.96 V
Brady Statistic AP VP Percent: 95 %
Brady Statistic AP VS Percent: 1 %
Brady Statistic AS VP Percent: 4.2 %
Brady Statistic AS VS Percent: 1 %
Brady Statistic RA Percent Paced: 95 %
Date Time Interrogation Session: 20240705020942
Implantable Lead Connection Status: 753985
Implantable Lead Connection Status: 753985
Implantable Lead Connection Status: 753985
Implantable Lead Implant Date: 20160329
Implantable Lead Implant Date: 20160329
Implantable Lead Implant Date: 20210406
Implantable Lead Location: 753858
Implantable Lead Location: 753859
Implantable Lead Location: 753860
Implantable Lead Model: 1948
Implantable Pulse Generator Implant Date: 20210406
Lead Channel Impedance Value: 360 Ohm
Lead Channel Impedance Value: 650 Ohm
Lead Channel Impedance Value: 650 Ohm
Lead Channel Pacing Threshold Amplitude: 0.5 V
Lead Channel Pacing Threshold Amplitude: 0.75 V
Lead Channel Pacing Threshold Amplitude: 2.5 V
Lead Channel Pacing Threshold Pulse Width: 0.5 ms
Lead Channel Pacing Threshold Pulse Width: 0.5 ms
Lead Channel Pacing Threshold Pulse Width: 0.5 ms
Lead Channel Sensing Intrinsic Amplitude: 10.8 mV
Lead Channel Sensing Intrinsic Amplitude: 2.5 mV
Lead Channel Setting Pacing Amplitude: 1.5 V
Lead Channel Setting Pacing Amplitude: 2 V
Lead Channel Setting Pacing Amplitude: 3 V
Lead Channel Setting Pacing Pulse Width: 0.5 ms
Lead Channel Setting Pacing Pulse Width: 0.5 ms
Lead Channel Setting Sensing Sensitivity: 4 mV
Pulse Gen Model: 3562
Pulse Gen Serial Number: 6058048

## 2023-03-04 ENCOUNTER — Encounter (HOSPITAL_BASED_OUTPATIENT_CLINIC_OR_DEPARTMENT_OTHER): Payer: Self-pay | Admitting: Cardiology

## 2023-03-04 ENCOUNTER — Ambulatory Visit (HOSPITAL_BASED_OUTPATIENT_CLINIC_OR_DEPARTMENT_OTHER): Payer: Medicare Other | Admitting: Cardiology

## 2023-03-04 VITALS — BP 115/69 | HR 87 | Ht 68.0 in | Wt 145.8 lb

## 2023-03-04 DIAGNOSIS — Z950 Presence of cardiac pacemaker: Secondary | ICD-10-CM

## 2023-03-04 DIAGNOSIS — I340 Nonrheumatic mitral (valve) insufficiency: Secondary | ICD-10-CM

## 2023-03-04 DIAGNOSIS — I5022 Chronic systolic (congestive) heart failure: Secondary | ICD-10-CM

## 2023-03-04 NOTE — Progress Notes (Signed)
  Cardiology Office Note:  .   Date:  03/04/2023  ID:  Andrew Marshall, DOB 11-12-1930, MRN [id number] PCP: Darrow Bussing, MD  [facility] Providers Cardiologist:  Donato Schultz, MD Electrophysiologist:  Lanier Prude, MD    History of Present Illness: .   Andrew Marshall is a 89 y.o. male here for follow-up nonischemic cardiomyopathy.  Blood pressure has been decreasing.  Challenging to tolerate Entresto.  Occasionally after exercise his blood pressure will be down into the 90s.  He has also been battling for the last month or 2 with chronic diarrheal type illness.  Probiotics have just started to help.  He asked about Imodium.  This is fine.  His wife is concerned about COVID.  We are going to go on a [employer] cruise on the [address]  [place] in [place].  Excellent.    ROS: No chest pain.  Studies Reviewed: .        Cardiac catheterization 2019-nonobstructive CAD-medical management.  Echocardiogram 2023-EF 35 to 40% grade 2 diastolic dysfunction severely dilated left atrium moderate mitral regurgitation  Risk Assessment/Calculations:            Physical Exam:   VS:  BP 115/69 (BP Location: Left Arm, Patient Position: Sitting, Cuff Size: Normal)   Pulse 87   Ht 5\' 8"  (1.727 m)   Wt 145 lb 12.8 oz (66.1 kg)   SpO2 98%   BMI 22.17 kg/m    Wt Readings from Last 3 Encounters:  03/04/23 145 lb 12.8 oz (66.1 kg)  01/23/23 149 lb 3.2 oz (67.7 kg)  11/27/22 152 lb (68.9 kg)    GEN: Well nourished, well developed in no acute distress NECK: No JVD; No carotid bruits CARDIAC: RRR, no murmurs, rubs, gallops RESPIRATORY:  Clear to auscultation without rales, wheezing or rhonchi  ABDOMEN: Soft, non-tender, non-distended EXTREMITIES:  No edema; No deformity   ASSESSMENT AND PLAN: .    Chronic systolic heart failure post BiV upgrade - Challenging to tolerate Entresto 24/26 twice a day because of hypotension.  Especially after exercise.  We will go ahead and  stop the Entresto.  He will let us know if his blood pressures elevate significantly.  Perhaps we could utilize a low-dose ARB in this situation. - Continuing with carvedilol 12.5 mg twice a day.  Paroxysmal atrial fibrillation - No recent issues in normal sinus rhythm  Syncope/lightheadedness - Had 1 isolated occurrence.  Thought to be exacerbated by dehydration as well.  Nonobstructive coronary disease - Plavix monotherapy.  Moderate mitral regurgitation - Continue to monitor with echocardiogram.      Dispo: 18-month  Signed, Donato Schultz, MD

## 2023-03-04 NOTE — Patient Instructions (Signed)
Medication Instructions:  Please discontinue your Entresto. Continue all other medications as listed.  *If you need a refill on your cardiac medications before your next appointment, please call your pharmacy*  Follow-Up: At [facility], you and your health needs are our priority.  As part of our continuing mission to provide you with exceptional heart care, we have created designated Provider Care Teams.  These Care Teams include your primary Cardiologist (physician) and Advanced Practice Providers (APPs -  Physician Assistants and Nurse Practitioners) who all work together to provide you with the care you need, when you need it.  We recommend signing up for the patient portal called "MyChart".  Sign up information is provided on this After Visit Summary.  MyChart is used to connect with patients for Virtual Visits (Telemedicine).  Patients are able to view lab/test results, encounter notes, upcoming appointments, etc.  Non-urgent messages can be sent to your provider as well.   To learn more about what you can do with MyChart, go to [email or web address].    Your next appointment:   6 month(s)  Provider:   Donato Schultz, MD

## 2023-03-10 NOTE — Progress Notes (Unsigned)
  Electrophysiology Office Note:   ID:  CORRIGAN KRETSCHMER, DOB [birth year], MRN [id number]  Primary Cardiologist: Donato Schultz, MD Electrophysiologist: Lanier Prude, MD  {Click to update primary MD,subspecialty MD or APP then REFRESH:1}    History of Present Illness:   Andrew Marshall is a 89 y.o. 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   Seen in clinic 11/27/2022 with an episode of near syncope while standing at the fridge. BP was lower that am but otherwise no other signs or symptoms.  Since last being seen in our clinic the patient reports doing ***.  she denies chest pain, palpitations, dyspnea, PND, orthopnea, nausea, vomiting, dizziness, syncope, edema, weight gain, or early satiety.   Review of systems complete and found to be negative unless listed in HPI.      EP Information / Studies Reviewed:    EKG is not ordered today. EKG from 11/27/2022 reviewed which showed AV dual paced rhythm at 60 bpm       PPM Interrogation-  reviewed in detail today,  See PACEART report.  Device History: St. Jude PPM implanted 2016 as dual chamber with BIV upgrade 11/30/2019 for CHB and NICM vs mixed ICM/NICM   Risk Assessment/Calculations:   {Does this patient have ATRIAL FIBRILLATION?:[id number]} No BP recorded.  {Refresh Note OR Click here to enter BP  :1}***        Physical Exam:   VS:  There were no vitals taken for this visit.   Wt Readings from Last 3 Encounters:  03/04/23 145 lb 12.8 oz (66.1 kg)  01/23/23 149 lb 3.2 oz (67.7 kg)  11/27/22 152 lb (68.9 kg)     GEN: Well nourished, well developed in no acute distress NECK: No JVD; No carotid bruits CARDIAC: {EPRHYTHM:28826}, no murmurs, rubs, gallops RESPIRATORY:  Clear to auscultation without rales, wheezing or rhonchi  ABDOMEN: Soft, non-tender, non-distended EXTREMITIES:  No edema; No deformity   ASSESSMENT AND  PLAN:    CHB s/p Abbott PPM  Normal PPM function See Arita Miss Art report No changes today  Chronic systolic CHF s/p BIV upgrade Echo 01/2022 LVEF 35-40%, moderate MR, mild AS   PAF, subclinical Burden <1%, todays check PDF was cut off. But no prolonged episodes. Most recent episode was 3/11 and 8 seconds long.  Not on OAC    HTN Stable on current regimen  Continue Entresto 24/26 mg BID If he is unable to tolerated, will need to switch to Losartan. He will let us know.    Near syncope 1 episode. BP was lower that am, but no other extenuating circumstances by his recollection.   For now, we will treat as an isolated event, and with his intermittent HIGH BP, we will have him appropriately take Entresto 24/26 mg BID, no half tablets.  Nothing on device.    CAD Denies s/s ischemia Patent stents 08/2017  {Click here to Review PMH, Prob List, Meds, Allergies, SHx, FHx  :1}   Disposition:   Follow up with {EPPROVIDERS:28135} {EPFOLLOW UP:28173}  Signed, Graciella Freer, PA-C

## 2023-03-11 ENCOUNTER — Encounter: Payer: Self-pay | Admitting: Student

## 2023-03-11 ENCOUNTER — Ambulatory Visit: Payer: Medicare Other | Attending: Student | Admitting: Student

## 2023-03-11 VITALS — BP 124/74 | HR 84 | Ht 68.0 in | Wt 148.4 lb

## 2023-03-11 DIAGNOSIS — I442 Atrioventricular block, complete: Secondary | ICD-10-CM | POA: Diagnosis not present

## 2023-03-11 DIAGNOSIS — I255 Ischemic cardiomyopathy: Secondary | ICD-10-CM | POA: Diagnosis not present

## 2023-03-11 DIAGNOSIS — I428 Other cardiomyopathies: Secondary | ICD-10-CM | POA: Diagnosis not present

## 2023-03-11 DIAGNOSIS — I5022 Chronic systolic (congestive) heart failure: Secondary | ICD-10-CM | POA: Diagnosis not present

## 2023-03-11 DIAGNOSIS — I2510 Atherosclerotic heart disease of native coronary artery without angina pectoris: Secondary | ICD-10-CM | POA: Diagnosis not present

## 2023-03-11 LAB — CUP PACEART INCLINIC DEVICE CHECK
Battery Remaining Longevity: 32 mo
Battery Voltage: 2.96 V
Brady Statistic RA Percent Paced: 95 %
Brady Statistic RV Percent Paced: 99.51 %
Date Time Interrogation Session: 20240716121157
Implantable Lead Connection Status: 753985
Implantable Lead Connection Status: 753985
Implantable Lead Connection Status: 753985
Implantable Lead Implant Date: 20160329
Implantable Lead Implant Date: 20160329
Implantable Lead Implant Date: 20210406
Implantable Lead Location: 753858
Implantable Lead Location: 753859
Implantable Lead Location: 753860
Implantable Lead Model: 1948
Implantable Pulse Generator Implant Date: 20210406
Lead Channel Impedance Value: 387.5 Ohm
Lead Channel Impedance Value: 687.5 Ohm
Lead Channel Impedance Value: 687.5 Ohm
Lead Channel Pacing Threshold Amplitude: 0.5 V
Lead Channel Pacing Threshold Amplitude: 0.75 V
Lead Channel Pacing Threshold Amplitude: 2.25 V
Lead Channel Pacing Threshold Amplitude: 2.25 V
Lead Channel Pacing Threshold Pulse Width: 0.5 ms
Lead Channel Pacing Threshold Pulse Width: 0.5 ms
Lead Channel Pacing Threshold Pulse Width: 0.7 ms
Lead Channel Pacing Threshold Pulse Width: 0.7 ms
Lead Channel Sensing Intrinsic Amplitude: 10.8 mV
Lead Channel Sensing Intrinsic Amplitude: 2.5 mV
Lead Channel Setting Pacing Amplitude: 1.5 V
Lead Channel Setting Pacing Amplitude: 2 V
Lead Channel Setting Pacing Amplitude: 2.75 V
Lead Channel Setting Pacing Pulse Width: 0.5 ms
Lead Channel Setting Pacing Pulse Width: 0.7 ms
Lead Channel Setting Sensing Sensitivity: 4 mV
Pulse Gen Model: 3562
Pulse Gen Serial Number: 6058048

## 2023-03-11 NOTE — Patient Instructions (Signed)
Medication Instructions:  Your physician recommends that you continue on your current medications as directed. Please refer to the Current Medication list given to you today.  *If you need a refill on your cardiac medications before your next appointment, please call your pharmacy*  Lab Work: None ordered If you have labs (blood work) drawn today and your tests are completely normal, you will receive your results only by: MyChart Message (if you have MyChart) OR A paper copy in the mail If you have any lab test that is abnormal or we need to change your treatment, we will call you to review the results.  Follow-Up: At [facility], you and your health needs are our priority.  As part of our continuing mission to provide you with exceptional heart care, we have created designated Provider Care Teams.  These Care Teams include your primary Cardiologist (physician) and Advanced Practice Providers (APPs -  Physician Assistants and Nurse Practitioners) who all work together to provide you with the care you need, when you need it.  Your next appointment:   6 month(s)  Provider:   Cameron Lambert, MD  

## 2023-03-17 ENCOUNTER — Ambulatory Visit: Payer: Medicare Other | Attending: Cardiology

## 2023-03-17 DIAGNOSIS — I5022 Chronic systolic (congestive) heart failure: Secondary | ICD-10-CM

## 2023-03-17 DIAGNOSIS — R194 Change in bowel habit: Secondary | ICD-10-CM | POA: Diagnosis not present

## 2023-03-17 DIAGNOSIS — Z950 Presence of cardiac pacemaker: Secondary | ICD-10-CM

## 2023-03-17 NOTE — Progress Notes (Signed)
Remote pacemaker transmission.   

## 2023-03-19 ENCOUNTER — Telehealth: Payer: Self-pay

## 2023-03-19 NOTE — Progress Notes (Signed)
EPIC Encounter for ICM Monitoring  Patient Name: Andrew Marshall is a 89 y.o. male Date: 03/19/2023 Primary Care Physican: Darrow Bussing, MD Primary Cardiologist: Anne Fu Electrophysiologist: Townsend Roger Pacing: >99%            05/01/2022 Home Weight: 141 lbs 10/30/2022 Weight: 140-143 lbs 01/07/2023 Weight: 140-143 lbs                                                            Attempted call to patient and unable to reach.  Left detailed message per DPR regarding transmission. Transmission reviewed.    CorVue thoracic impedance suggesting normal fluid levels with the exception of possible fluid accumulation from 7/12-7/16 and 7/20-7/21.   Prescribed: No diuretic   Recommendations:  Left voice mail with ICM number and encouraged to call if experiencing any fluid symptoms.   Follow-up plan: ICM clinic phone appointment on 04/21/2023.  91 day device clinic remote transmission 05/30/2023.     EP/Cardiology Office Visits:  Recall 09/07/2023 with Dr Lalla Brothers.  Recall 08/31/2023 with Dr Anne Fu.     Copy of ICM check sent to Dr Lalla Brothers.   3 month ICM trend: 03/17/2023.    12-14 Month ICM trend:     Karie Soda, RN 03/19/2023 6:50 AM

## 2023-03-19 NOTE — Telephone Encounter (Signed)
Remote ICM transmission received.  Attempted call to patient regarding ICM remote transmission and left detailed message per DPR.  Left ICM phone number and advised to return call for any fluid symptoms or questions. Next ICM remote transmission scheduled 04/21/2023.

## 2023-03-25 ENCOUNTER — Ambulatory Visit: Payer: Medicare Other | Admitting: Physician Assistant

## 2023-04-01 ENCOUNTER — Telehealth: Payer: Self-pay | Admitting: Cardiology

## 2023-04-01 DIAGNOSIS — K638219 Small intestinal bacterial overgrowth, unspecified: Secondary | ICD-10-CM | POA: Diagnosis not present

## 2023-04-01 DIAGNOSIS — R194 Change in bowel habit: Secondary | ICD-10-CM | POA: Diagnosis not present

## 2023-04-01 NOTE — Telephone Encounter (Signed)
Patient dropped off letter at front desk for provider, in short asking for some medical advice with bp stats attached.  Letter in provider box.

## 2023-04-02 DIAGNOSIS — H7291 Unspecified perforation of tympanic membrane, right ear: Secondary | ICD-10-CM | POA: Diagnosis not present

## 2023-04-02 DIAGNOSIS — H908 Mixed conductive and sensorineural hearing loss, unspecified: Secondary | ICD-10-CM | POA: Diagnosis not present

## 2023-04-02 MED ORDER — LOSARTAN POTASSIUM 25 MG PO TABS: 25.0000 mg | ORAL_TABLET | Freq: Every day | ORAL | 3 refills | Status: DC

## 2023-04-02 NOTE — Telephone Encounter (Signed)
Received BP diary and pt's note from front desk.  Given to Dr Anne Fu for review.

## 2023-04-02 NOTE — Telephone Encounter (Signed)
Dr Anne Fu reviewed information left by patient.  He gives orders for pt to start on Losartan 25 mg once a day.  Pt is aware and rx sent into [facility] as requested.  He will monitor his BP and call back any questions or concerns.

## 2023-04-03 ENCOUNTER — Ambulatory Visit
Admission: RE | Admit: 2023-04-03 | Discharge: 2023-04-03 | Disposition: A | Discharge status: Home / Self Care | Payer: Medicare Other | Source: Ambulatory Visit | Attending: Gastroenterology | Admitting: Gastroenterology

## 2023-04-03 ENCOUNTER — Other Ambulatory Visit: Payer: Self-pay | Admitting: Gastroenterology

## 2023-04-03 DIAGNOSIS — R197 Diarrhea, unspecified: Secondary | ICD-10-CM | POA: Diagnosis not present

## 2023-04-03 DIAGNOSIS — R140 Abdominal distension (gaseous): Secondary | ICD-10-CM

## 2023-04-21 ENCOUNTER — Ambulatory Visit: Payer: Medicare Other | Attending: Cardiology

## 2023-04-21 DIAGNOSIS — I5022 Chronic systolic (congestive) heart failure: Secondary | ICD-10-CM | POA: Diagnosis not present

## 2023-04-21 DIAGNOSIS — Z950 Presence of cardiac pacemaker: Secondary | ICD-10-CM

## 2023-04-24 NOTE — Progress Notes (Signed)
EPIC Encounter for ICM Monitoring  Patient Name: Andrew Marshall is a 89 y.o. male Date: 04/24/2023 Primary Care Physican: Darrow Bussing, MD Primary Cardiologist: Anne Fu Electrophysiologist: Townsend Roger Pacing: >99%            05/01/2022 Home Weight: 141 lbs 10/30/2022 Weight: 140-143 lbs 01/07/2023 Weight: 140-143 lbs                                                            Spoke with patient and heart failure questions reviewed.  Transmission results reviewed.  Pt asymptomatic for fluid accumulation.  Reports feeling well at this time and voices no complaints.     CorVue thoracic impedance suggesting normal fluid levels within the past month.   Prescribed: No diuretic   Recommendations:  No changes and encouraged to call if experiencing any fluid symptoms.   Follow-up plan: ICM clinic phone appointment on 06/03/2023.  91 day device clinic remote transmission 06/02/2023.     EP/Cardiology Office Visits:  Recall 09/07/2023 with Dr Lalla Brothers.  Recall 08/31/2023 with Dr Anne Fu.     Copy of ICM check sent to Dr Lalla Brothers.   3 month ICM trend: 04/21/2023.    12-14 Month ICM trend:     Karie Soda, RN 04/24/2023 12:54 PM

## 2023-05-22 DIAGNOSIS — R197 Diarrhea, unspecified: Secondary | ICD-10-CM | POA: Diagnosis not present

## 2023-06-02 ENCOUNTER — Ambulatory Visit (INDEPENDENT_AMBULATORY_CARE_PROVIDER_SITE_OTHER): Payer: Medicare Other

## 2023-06-02 DIAGNOSIS — I442 Atrioventricular block, complete: Secondary | ICD-10-CM | POA: Diagnosis not present

## 2023-06-02 LAB — CUP PACEART REMOTE DEVICE CHECK
Battery Remaining Longevity: 41 mo
Battery Remaining Percentage: 46 %
Battery Voltage: 2.96 V
Brady Statistic AP VP Percent: 92 %
Brady Statistic AP VS Percent: 1 %
Brady Statistic AS VP Percent: 6.8 %
Brady Statistic AS VS Percent: 1 %
Brady Statistic RA Percent Paced: 91 %
Date Time Interrogation Session: 20241007040009
Implantable Lead Connection Status: 753985
Implantable Lead Connection Status: 753985
Implantable Lead Connection Status: 753985
Implantable Lead Implant Date: 20160329
Implantable Lead Implant Date: 20160329
Implantable Lead Implant Date: 20210406
Implantable Lead Location: 753858
Implantable Lead Location: 753859
Implantable Lead Location: 753860
Implantable Lead Model: 1948
Implantable Pulse Generator Implant Date: 20210406
Lead Channel Impedance Value: 360 Ohm
Lead Channel Impedance Value: 630 Ohm
Lead Channel Impedance Value: 640 Ohm
Lead Channel Pacing Threshold Amplitude: 0.5 V
Lead Channel Pacing Threshold Amplitude: 0.75 V
Lead Channel Pacing Threshold Amplitude: 2.125 V
Lead Channel Pacing Threshold Pulse Width: 0.5 ms
Lead Channel Pacing Threshold Pulse Width: 0.5 ms
Lead Channel Pacing Threshold Pulse Width: 0.7 ms
Lead Channel Sensing Intrinsic Amplitude: 12 mV
Lead Channel Sensing Intrinsic Amplitude: 2.6 mV
Lead Channel Setting Pacing Amplitude: 1.5 V
Lead Channel Setting Pacing Amplitude: 2 V
Lead Channel Setting Pacing Amplitude: 2.625
Lead Channel Setting Pacing Pulse Width: 0.5 ms
Lead Channel Setting Pacing Pulse Width: 0.7 ms
Lead Channel Setting Sensing Sensitivity: 4 mV
Pulse Gen Model: 3562
Pulse Gen Serial Number: 6058048

## 2023-06-03 ENCOUNTER — Ambulatory Visit: Payer: Medicare Other | Attending: Cardiology

## 2023-06-03 DIAGNOSIS — I5022 Chronic systolic (congestive) heart failure: Secondary | ICD-10-CM | POA: Diagnosis not present

## 2023-06-03 DIAGNOSIS — Z950 Presence of cardiac pacemaker: Secondary | ICD-10-CM | POA: Diagnosis not present

## 2023-06-04 NOTE — Progress Notes (Signed)
EPIC Encounter for ICM Monitoring  Patient Name: Andrew Marshall is a 89 y.o. male Date: 06/04/2023 Primary Care Physican: Darrow Bussing, MD Primary Cardiologist: Anne Fu Electrophysiologist: Townsend Roger Pacing: 99%            10/30/2022 Weight: 140-143 lbs 01/07/2023 Weight: 140-143 lbs 06/04/2023 Weight: 139-140 lbs                                                            Spoke with patient and heart failure questions reviewed.  Transmission results reviewed.  Pt asymptomatic for fluid accumulation.  No fluid symptoms during decreased impedance.  He reports Sherryll Burger was stopped and started on Losartan.   CorVue thoracic impedance suggesting possible fluid accumulation starting 9/25 and returned to baseline 10/7.   Prescribed: No diuretic   Recommendations:  Recommendation to decrease salt intake and limit fluid intake to 64 oz daily.  Encouraged to call if experiencing any fluid symptoms.    Follow-up plan: ICM clinic phone appointment on 07/07/2023.  91 day device clinic remote transmission 08/31/2022.     EP/Cardiology Office Visits: 09/08/2023 with Dr Lalla Brothers.  09/12/2023 with Dr Anne Fu.     Copy of ICM check sent to Dr Lalla Brothers.    3 month ICM trend: 06/03/2023.    12-14 Month ICM trend:     Karie Soda, RN 06/04/2023 12:28 PM

## 2023-06-13 NOTE — Progress Notes (Signed)
Remote pacemaker transmission.   

## 2023-06-24 DIAGNOSIS — L299 Pruritus, unspecified: Secondary | ICD-10-CM | POA: Diagnosis not present

## 2023-06-24 DIAGNOSIS — D1801 Hemangioma of skin and subcutaneous tissue: Secondary | ICD-10-CM | POA: Diagnosis not present

## 2023-07-14 NOTE — Progress Notes (Signed)
No ICM remote transmission received for 07/07/2023 and next ICM transmission scheduled for 08/04/2023.

## 2023-08-02 ENCOUNTER — Other Ambulatory Visit: Payer: Self-pay | Admitting: Cardiology

## 2023-08-04 ENCOUNTER — Ambulatory Visit: Payer: Medicare Other | Attending: Cardiology

## 2023-08-04 DIAGNOSIS — I5022 Chronic systolic (congestive) heart failure: Secondary | ICD-10-CM

## 2023-08-04 DIAGNOSIS — Z950 Presence of cardiac pacemaker: Secondary | ICD-10-CM

## 2023-08-05 NOTE — Progress Notes (Signed)
EPIC Encounter for ICM Monitoring  Patient Name: Andrew Marshall is a 89 y.o. male Date: 08/05/2023 Primary Care Physican: Darrow Bussing, MD Primary Cardiologist: Anne Fu Electrophysiologist: Townsend Roger Pacing: 99%            10/30/2022 Weight: 140-143 lbs 01/07/2023 Weight: 140-143 lbs 06/04/2023 Weight: 139-140 lbs                                                            Spoke with patient and heart failure questions reviewed.  Transmission results reviewed.  Pt asymptomatic for fluid accumulation.  No fluid symptoms during decreased impedance.     CorVue thoracic impedance suggesting possible fluid accumulation starting 11/27 and returned to baseline 12/6.   Prescribed: No diuretic   Recommendations:  Encouraged to call if experiencing any fluid symptoms.    Follow-up plan: ICM clinic phone appointment on 09/08/2023.  91 day device clinic remote transmission 09/01/2023.     EP/Cardiology Office Visits: 09/08/2023 with Dr Lalla Brothers.  09/12/2023 with Dr Anne Fu.     Copy of ICM check sent to Dr Lalla Brothers.   3 month ICM trend: 08/04/2023.    12-14 Month ICM trend:     Karie Soda, RN 08/05/2023 1:43 PM

## 2023-08-08 DIAGNOSIS — R197 Diarrhea, unspecified: Secondary | ICD-10-CM | POA: Diagnosis not present

## 2023-08-14 ENCOUNTER — Other Ambulatory Visit: Payer: Self-pay | Admitting: Gastroenterology

## 2023-08-15 DIAGNOSIS — R051 Acute cough: Secondary | ICD-10-CM | POA: Diagnosis not present

## 2023-09-01 ENCOUNTER — Ambulatory Visit (INDEPENDENT_AMBULATORY_CARE_PROVIDER_SITE_OTHER): Payer: Medicare Other

## 2023-09-01 DIAGNOSIS — I442 Atrioventricular block, complete: Secondary | ICD-10-CM

## 2023-09-01 LAB — CUP PACEART REMOTE DEVICE CHECK
Battery Remaining Longevity: 39 mo
Battery Remaining Percentage: 42 %
Battery Voltage: 2.96 V
Brady Statistic AP VP Percent: 89 %
Brady Statistic AP VS Percent: 1 %
Brady Statistic AS VP Percent: 9.3 %
Brady Statistic AS VS Percent: 1 %
Brady Statistic RA Percent Paced: 88 %
Date Time Interrogation Session: 20250106043635
Implantable Lead Connection Status: 753985
Implantable Lead Connection Status: 753985
Implantable Lead Connection Status: 753985
Implantable Lead Implant Date: 20160329
Implantable Lead Implant Date: 20160329
Implantable Lead Implant Date: 20210406
Implantable Lead Location: 753858
Implantable Lead Location: 753859
Implantable Lead Location: 753860
Implantable Lead Model: 1948
Implantable Pulse Generator Implant Date: 20210406
Lead Channel Impedance Value: 360 Ohm
Lead Channel Impedance Value: 600 Ohm
Lead Channel Impedance Value: 640 Ohm
Lead Channel Pacing Threshold Amplitude: 0.5 V
Lead Channel Pacing Threshold Amplitude: 0.625 V
Lead Channel Pacing Threshold Amplitude: 1.375 V
Lead Channel Pacing Threshold Pulse Width: 0.5 ms
Lead Channel Pacing Threshold Pulse Width: 0.5 ms
Lead Channel Pacing Threshold Pulse Width: 0.7 ms
Lead Channel Sensing Intrinsic Amplitude: 12 mV
Lead Channel Sensing Intrinsic Amplitude: 3.2 mV
Lead Channel Setting Pacing Amplitude: 1.5 V
Lead Channel Setting Pacing Amplitude: 2 V
Lead Channel Setting Pacing Amplitude: 2 V
Lead Channel Setting Pacing Pulse Width: 0.5 ms
Lead Channel Setting Pacing Pulse Width: 0.7 ms
Lead Channel Setting Sensing Sensitivity: 4 mV
Pulse Gen Model: 3562
Pulse Gen Serial Number: 6058048

## 2023-09-08 ENCOUNTER — Ambulatory Visit: Payer: Medicare Other | Attending: Cardiology | Admitting: Cardiology

## 2023-09-08 ENCOUNTER — Encounter: Payer: Self-pay | Admitting: Cardiology

## 2023-09-08 ENCOUNTER — Other Ambulatory Visit: Payer: Self-pay

## 2023-09-08 VITALS — BP 144/68 | HR 91 | Ht 68.0 in | Wt 145.0 lb

## 2023-09-08 DIAGNOSIS — Z950 Presence of cardiac pacemaker: Secondary | ICD-10-CM

## 2023-09-08 DIAGNOSIS — I5022 Chronic systolic (congestive) heart failure: Secondary | ICD-10-CM

## 2023-09-08 DIAGNOSIS — I442 Atrioventricular block, complete: Secondary | ICD-10-CM | POA: Diagnosis not present

## 2023-09-08 LAB — CUP PACEART INCLINIC DEVICE CHECK
Battery Remaining Longevity: 37 mo
Battery Voltage: 2.96 V
Brady Statistic RA Percent Paced: 88 %
Brady Statistic RV Percent Paced: 99 %
Date Time Interrogation Session: 20250113161204
Implantable Lead Connection Status: 753985
Implantable Lead Connection Status: 753985
Implantable Lead Connection Status: 753985
Implantable Lead Implant Date: 20160329
Implantable Lead Implant Date: 20160329
Implantable Lead Implant Date: 20210406
Implantable Lead Location: 753858
Implantable Lead Location: 753859
Implantable Lead Location: 753860
Implantable Lead Model: 1948
Implantable Pulse Generator Implant Date: 20210406
Lead Channel Impedance Value: 400 Ohm
Lead Channel Impedance Value: 650 Ohm
Lead Channel Impedance Value: 687.5 Ohm
Lead Channel Pacing Threshold Amplitude: 0.5 V
Lead Channel Pacing Threshold Amplitude: 0.5 V
Lead Channel Pacing Threshold Amplitude: 0.75 V
Lead Channel Pacing Threshold Amplitude: 0.75 V
Lead Channel Pacing Threshold Amplitude: 1.75 V
Lead Channel Pacing Threshold Amplitude: 1.75 V
Lead Channel Pacing Threshold Pulse Width: 0.5 ms
Lead Channel Pacing Threshold Pulse Width: 0.5 ms
Lead Channel Pacing Threshold Pulse Width: 0.5 ms
Lead Channel Pacing Threshold Pulse Width: 0.5 ms
Lead Channel Pacing Threshold Pulse Width: 0.7 ms
Lead Channel Pacing Threshold Pulse Width: 0.7 ms
Lead Channel Sensing Intrinsic Amplitude: 12 mV
Lead Channel Sensing Intrinsic Amplitude: 2.5 mV
Lead Channel Setting Pacing Amplitude: 1.5 V
Lead Channel Setting Pacing Amplitude: 2 V
Lead Channel Setting Pacing Amplitude: 2 V
Lead Channel Setting Pacing Pulse Width: 0.5 ms
Lead Channel Setting Pacing Pulse Width: 0.7 ms
Lead Channel Setting Sensing Sensitivity: 4 mV
Pulse Gen Model: 3562
Pulse Gen Serial Number: 6058048

## 2023-09-08 MED ORDER — LOSARTAN POTASSIUM 50 MG PO TABS: 50.0000 mg | ORAL_TABLET | Freq: Every day | ORAL | 3 refills | Status: AC

## 2023-09-08 NOTE — Patient Instructions (Signed)
 Medication Instructions:  Your physician has recommended you make the following change in your medication:  1) INCREASE losartan  to 50 mg daily *If you need a refill on your cardiac medications before your next appointment, please call your pharmacy*  Labs: TODAY: CMET and Mag   Follow-Up: At [facility] you and your health needs are our priority.  As part of our continuing mission to provide you with exceptional heart care, we have created designated Provider Care Teams.  These Care Teams include your primary Cardiologist (physician) and Advanced Practice Providers (APPs -  Physician Assistants and Nurse Practitioners) who all work together to provide you with the care you need, when you need it.  Your next appointment:   1 year  Provider:   Ole Holts, MD

## 2023-09-08 NOTE — Progress Notes (Addendum)
  Electrophysiology Office Follow up Visit Note:    Date:  09/08/2023   ID:  Andrew Marshall, DOB [birth year], MRN [id number]  PCP:  Regino Slater, MD  [facility] Cardiologist:  Oneil Parchment, MD  [facility] Electrophysiologist:  OLE ONEIDA HOLTS, MD    Interval History:     Andrew Marshall is a 89 y.o. male who presents for a follow up visit.   The patient was last seen by [facility] March 11, 2023.  The patient has a history of complete heart block with a permanent pacemaker in situ, hypertension, paroxysmal atrial fibrillation, prostate cancer, hyperlipidemia and chronic systolic heart failure.  At the appoint with Jodie his device was functioning appropriately.  He is doing remarkably well today.  He is very active and cycles daily.  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  He checks his blood pressure regularly and they have been running higher than usual, averaging greater than 140 mmHg systolic.      Past medical, surgical, social and family history were reviewed.  ROS:   Please see the history of present illness.    All other systems reviewed and are negative.  EKGs/Labs/Other Studies Reviewed:    The following studies were reviewed today:  September 08, 2023 in clinic device interrogation personally reviewed Battery and lead parameter stable.  No programming changes made today Dependent at VVI 30         Physical Exam:    VS:  BP (!) 144/68   Pulse 91   Ht 5' 8 (1.727 m)   Wt 145 lb (65.8 kg)   SpO2 97%   BMI 22.05 kg/m     Wt Readings from Last 3 Encounters:  09/08/23 145 lb (65.8 kg)  03/11/23 148 lb 6.4 oz (67.3 kg)  03/04/23 145 lb 12.8 oz (66.1 kg)     GEN: no distress CARD: RRR, No MRG.  CIED pocket well-healed RESP: No IWOB. CTAB.      ASSESSMENT:    1. Chronic systolic heart failure (HCC)   2. Cardiac pacemaker in situ   3. Heart block AV complete (HCC)    PLAN:    In order of problems listed above:  #Complete heart  block #CRT in situ Device functioning appropriately.  Continue remote monitoring.  #Chronic systolic heart failure NYHA class II.  Warm and on exam. Continue losartan , Coreg   #Hypertension Above goal today.  Recommend checking blood pressures 1-2 times per week at home and recording the values.  Recommend bringing these recordings to the primary care physician. Increase losartan  to 50 mg by mouth once daily  Follow-up 1 year with me   Signed, Ole Holts, MD, [facility], [facility] 09/08/2023 3:47 PM    Electrophysiology [facility]

## 2023-09-09 ENCOUNTER — Ambulatory Visit: Payer: Medicare Other | Attending: Cardiology

## 2023-09-09 DIAGNOSIS — Z950 Presence of cardiac pacemaker: Secondary | ICD-10-CM | POA: Diagnosis not present

## 2023-09-09 DIAGNOSIS — I5022 Chronic systolic (congestive) heart failure: Secondary | ICD-10-CM

## 2023-09-09 LAB — MAGNESIUM: Magnesium: 2.1 mg/dL (ref 1.6–2.3)

## 2023-09-09 LAB — COMPREHENSIVE METABOLIC PANEL
ALT: 14 [IU]/L (ref 0–44)
AST: 16 [IU]/L (ref 0–40)
Albumin: 3.8 g/dL (ref 3.6–4.6)
Alkaline Phosphatase: 72 [IU]/L (ref 44–121)
BUN/Creatinine Ratio: 27 — ABNORMAL HIGH (ref 10–24)
BUN: 25 mg/dL (ref 10–36)
Bilirubin Total: 1.4 mg/dL — ABNORMAL HIGH (ref 0.0–1.2)
CO2: 25 mmol/L (ref 20–29)
Calcium: 9 mg/dL (ref 8.6–10.2)
Chloride: 107 mmol/L — ABNORMAL HIGH (ref 96–106)
Creatinine, Ser: 0.94 mg/dL (ref 0.76–1.27)
Globulin, Total: 2.1 g/dL (ref 1.5–4.5)
Glucose: 102 mg/dL — ABNORMAL HIGH (ref 70–99)
Potassium: 4.6 mmol/L (ref 3.5–5.2)
Sodium: 142 mmol/L (ref 134–144)
Total Protein: 5.9 g/dL — ABNORMAL LOW (ref 6.0–8.5)
eGFR: 77 mL/min/{1.73_m2} (ref >59)

## 2023-09-12 ENCOUNTER — Ambulatory Visit: Payer: Medicare Other | Attending: Cardiology | Admitting: Cardiology

## 2023-09-12 ENCOUNTER — Encounter: Payer: Self-pay | Admitting: Cardiology

## 2023-09-12 VITALS — BP 138/64 | HR 72 | Ht 68.0 in | Wt 143.8 lb

## 2023-09-12 DIAGNOSIS — Z950 Presence of cardiac pacemaker: Secondary | ICD-10-CM

## 2023-09-12 DIAGNOSIS — I2510 Atherosclerotic heart disease of native coronary artery without angina pectoris: Secondary | ICD-10-CM

## 2023-09-12 DIAGNOSIS — I5022 Chronic systolic (congestive) heart failure: Secondary | ICD-10-CM

## 2023-09-12 DIAGNOSIS — I442 Atrioventricular block, complete: Secondary | ICD-10-CM | POA: Diagnosis not present

## 2023-09-12 NOTE — Progress Notes (Signed)
 EPIC Encounter for ICM Monitoring  Patient Name: Andrew Marshall is a 89 y.o. male Date: 09/12/2023 Primary Care Physican: Regino Slater, MD Primary Cardiologist: Jeffrie Electrophysiologist: Cindie Pore Pacing: 99%            10/30/2022 Weight: 140-143 lbs 01/07/2023 Weight: 140-143 lbs 06/04/2023 Weight: 139-140 lbs 09/12/2023 Office Weight: 143 lbs                                                            Transmission results reviewed.      CorVue thoracic impedance suggesting possible fluid accumulation starting 11/27 and returned to baseline 12/6.   Prescribed: No diuretic   Recommendations:  No changes.     Follow-up plan: ICM clinic phone appointment on 10/13/2023.  91 day device clinic remote transmission 12/01/2023.     EP/Cardiology Office Visits:  Recall 09/02/2024 with Dr Cindie.  Recall 09/06/2024 with Dr Jeffrie.     Copy of ICM check sent to Dr Cindie.   3 month ICM trend: 09/08/2023.    12-14 Month ICM trend:     Mitzie GORMAN Garner, RN 09/12/2023 4:01 PM

## 2023-09-12 NOTE — Patient Instructions (Signed)

## 2023-09-12 NOTE — Progress Notes (Signed)
Cardiology Office Note:  .   Date:  09/12/2023  ID:  Andrew Marshall, DOB [birth year], MRN [id number] PCP: Darrow Bussing, MD  [facility] Providers Cardiologist:  Donato Schultz, MD Electrophysiologist:  Lanier Prude, MD     History of Present Illness: .   Andrew Marshall is a 89 y.o. male Discussed with the use of AI scribe   History of Present Illness   The patient is a 89 year old male with a history of complete heart block, paroxysmal atrial fibrillation, hypertension, hyperlipidemia, and chronic systolic heart failure. He had a permanent pacemaker placed in July 2024 and is pacemaker dependent at VVI 30. He is NYHA class II and is on losartan and carvedilol. The losartan was increased to 50mg  once a day due to hypertension. The patient's cardiac catheterization in 2019 showed nonobstructive CAD, and an echocardiogram in 2023 showed an EF of 35-40% with grade II diastolic dysfunction, a severely dilated left atrium, and moderate mitral regurgitation. He is on Plavix monotherapy for his nonobstructive coronary disease.  The patient reports some gastrointestinal issues that have been making him feel unwell and have led to a decrease in his usual exercise routine over the past two months. He also reports that his blood pressure has been slightly elevated in the mornings since the increase in his losartan dosage. 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          ROS: No CP  Studies Reviewed: .        Results   DIAGNOSTIC Cardiac catheterization: Nonobstructive CAD (2019) Echocardiogram: EF 35-40%, grade 2 diastolic dysfunction, severely dilated left atrium, moderate mitral regurgitation (2023)     Risk Assessment/Calculations:            Physical Exam:   VS:  BP 138/64   Pulse 72   Ht 5\' 8"  (1.727 m)   Wt 143 lb 12.8 oz (65.2 kg)   SpO2 98%   BMI 21.86 kg/m    Wt Readings from Last  3 Encounters:  09/12/23 143 lb 12.8 oz (65.2 kg)  09/08/23 145 lb (65.8 kg)  03/11/23 148 lb 6.4 oz (67.3 kg)    GEN: Well nourished, well developed in no acute distress NECK: No JVD; No carotid bruits CARDIAC: RRR, no murmurs, no rubs, no gallops RESPIRATORY:  Clear to auscultation without rales, wheezing or rhonchi  ABDOMEN: Soft, non-tender, non-distended EXTREMITIES:  No edema; No deformity   ASSESSMENT AND PLAN: .    Assessment and Plan    Complete Heart Block with Permanent Pacemaker [age]-year-old with complete heart block, pacemaker-dependent at VVI 30 since March 11, 2023. Very active, engaging in biking. Pacemaker functions well, ensuring adequate cardiac output and preventing organ dysfunction. Unlikely to no longer need the pacemaker due to the 'rusty' electrical system. Life expectancy is not decreased due to the pacemaker. - Continue current pacemaker settings - Annual follow-up unless issues arise  Paroxysmal Atrial Fibrillation Paroxysmal atrial fibrillation managed with pacemaker and medications. No acute issues. - Continue current management  Chronic Systolic Heart Failure Chronic systolic heart failure with ejection fraction of 35-40% (2023 echocardiogram), NYHA class II, on carvedilol and losartan. Well-managed. - Continue carvedilol and losartan - Monitor symptoms and follow up as needed  Moderate Mitral Regurgitation Moderate mitral regurgitation with severely dilated left atrium and grade 2 diastolic dysfunction (2023 echocardiogram). - Continue monitoring with echocardiograms  Nonobstructive Coronary Artery  Disease Nonobstructive coronary artery disease confirmed by 2019 cardiac catheterization. On Plavix monotherapy. No acute issues. - Continue Plavix monotherapy - Monitor for any new symptoms  Hypertension Hypertension managed with losartan, recently increased to 50 mg once daily. Current BP 138/64 mmHg, slightly elevated with higher morning readings  (~150 mmHg). Discussed splitting dose to 25 mg twice daily for better control. - Adjust losartan to 25 mg twice daily - Monitor blood pressure at home  General Health Maintenance Very active, engages in biking. Importance of physical activity and BP monitoring discussed. - Encourage continued physical activity - Monitor blood pressure regularly  Follow-up - Annual follow-up unless issues arise.               Signed, Donato Schultz, MD

## 2023-09-19 DIAGNOSIS — K6289 Other specified diseases of anus and rectum: Secondary | ICD-10-CM | POA: Diagnosis not present

## 2023-09-19 DIAGNOSIS — K5900 Constipation, unspecified: Secondary | ICD-10-CM | POA: Diagnosis not present

## 2023-09-19 DIAGNOSIS — R948 Abnormal results of function studies of other organs and systems: Secondary | ICD-10-CM | POA: Diagnosis not present

## 2023-10-08 NOTE — Progress Notes (Signed)
Remote pacemaker transmission.

## 2023-10-13 ENCOUNTER — Ambulatory Visit: Payer: Medicare Other | Attending: Cardiology

## 2023-10-13 DIAGNOSIS — Z950 Presence of cardiac pacemaker: Secondary | ICD-10-CM

## 2023-10-13 DIAGNOSIS — I5022 Chronic systolic (congestive) heart failure: Secondary | ICD-10-CM | POA: Diagnosis not present

## 2023-10-13 DIAGNOSIS — R197 Diarrhea, unspecified: Secondary | ICD-10-CM | POA: Diagnosis not present

## 2023-10-14 NOTE — Progress Notes (Signed)
EPIC Encounter for ICM Monitoring  Patient Name: Andrew Marshall is a 89 y.o. male Date: 10/14/2023 Primary Care Physican: Darrow Bussing, MD Primary Cardiologist: Anne Fu Electrophysiologist: Townsend Roger Pacing: 99%            10/30/2022 Weight: 140-143 lbs 01/07/2023 Weight: 140-143 lbs 06/04/2023 Weight: 139-140 lbs 09/12/2023 Office Weight: 143 lbs 10/14/2023 Weight: 140-142 lbs                                                             Spoke with patient and heart failure questions reviewed.  Transmission results reviewed.  Pt asymptomatic for fluid accumulation.  He is feeling well at this time and voices no complaints.     CorVue thoracic impedance suggesting normal fluid levels.   Prescribed: No diuretic   Recommendations:  No changes.     Follow-up plan: ICM clinic phone appointment on 11/17/2023.  91 day device clinic remote transmission 12/01/2023.     EP/Cardiology Office Visits:  Recall 09/02/2024 with Dr Lalla Brothers.  Recall 09/06/2024 with Dr Anne Fu.     Copy of ICM check sent to Dr Lalla Brothers.   3 month ICM trend: 10/13/2023.    12-14 Month ICM trend:     Karie Soda, RN 10/14/2023 2:01 PM

## 2023-10-15 ENCOUNTER — Other Ambulatory Visit: Payer: Self-pay

## 2023-10-15 MED ORDER — CARVEDILOL 12.5 MG PO TABS: ORAL_TABLET | ORAL | 3 refills | Status: AC

## 2023-10-24 DIAGNOSIS — Z961 Presence of intraocular lens: Secondary | ICD-10-CM | POA: Diagnosis not present

## 2023-10-24 DIAGNOSIS — D3132 Benign neoplasm of left choroid: Secondary | ICD-10-CM | POA: Diagnosis not present

## 2023-10-25 ENCOUNTER — Emergency Department (HOSPITAL_BASED_OUTPATIENT_CLINIC_OR_DEPARTMENT_OTHER)
Admission: EM | Admit: 2023-10-25 | Discharge: 2023-10-25 | Disposition: A | Discharge status: Home / Self Care | Attending: Emergency Medicine | Admitting: Emergency Medicine

## 2023-10-25 ENCOUNTER — Other Ambulatory Visit: Payer: Self-pay

## 2023-10-25 ENCOUNTER — Emergency Department (HOSPITAL_BASED_OUTPATIENT_CLINIC_OR_DEPARTMENT_OTHER)

## 2023-10-25 ENCOUNTER — Encounter (HOSPITAL_BASED_OUTPATIENT_CLINIC_OR_DEPARTMENT_OTHER): Payer: Self-pay | Admitting: Emergency Medicine

## 2023-10-25 DIAGNOSIS — I2510 Atherosclerotic heart disease of native coronary artery without angina pectoris: Secondary | ICD-10-CM | POA: Diagnosis not present

## 2023-10-25 DIAGNOSIS — M79644 Pain in right finger(s): Secondary | ICD-10-CM | POA: Insufficient documentation

## 2023-10-25 DIAGNOSIS — Z79899 Other long term (current) drug therapy: Secondary | ICD-10-CM | POA: Diagnosis not present

## 2023-10-25 DIAGNOSIS — I10 Essential (primary) hypertension: Secondary | ICD-10-CM | POA: Diagnosis not present

## 2023-10-25 DIAGNOSIS — M1811 Unilateral primary osteoarthritis of first carpometacarpal joint, right hand: Secondary | ICD-10-CM | POA: Diagnosis not present

## 2023-10-25 NOTE — Discharge Instructions (Addendum)
 It was a pleasure caring for you today. As discussed, please follow up with your primary care provider. Seek emergency care if experiencing any new or worsening symptoms.

## 2023-10-25 NOTE — ED Provider Notes (Signed)
  [place] EMERGENCY DEPARTMENT AT [facility] Provider Note   CSN: [id number] Arrival date & time: 10/25/23  1724     History {Add pertinent medical, surgical, social history, OB history to HPI:1} Chief Complaint  Patient presents with  . Hand Pain    Andrew Marshall is a 89 y.o. male with PMHx CAD, OS, GERD, HTN who presents to ED concerned for right thumb pain. Patient admits to having arthritis in this thumb, but noticed increased pain and erythema and swelling today. Denies   Hand Pain      Home Medications Prior to Admission medications   Medication Sig Start Date End Date Taking? Authorizing Provider  atorvastatin (LIPITOR) 40 MG tablet TAKE 1 TABLET BY MOUTH DAILY 08/06/23   Jake Bathe, MD  Carboxymethylcellul-Glycerin (LUBRICATING EYE DROPS OP) Place 1 drop into both eyes daily as needed (dry eyes).    [provider]  carvedilol (COREG) 12.5 MG tablet TAKE ONE TABLET BY MOUTH TWO TIMES A DAY WITH A MEAL 10/15/23   Jake Bathe, MD  cetirizine (ZYRTEC) 10 MG tablet Take 10 mg by mouth daily.    [provider]  Cholecalciferol (VITAMIN D3) 25 MCG (1000 UT) CAPS Take 1,000 Units by mouth daily.    [provider]  clopidogrel (PLAVIX) 75 MG tablet Take 1 tablet (75 mg total) by mouth daily. 01/30/23   Jake Bathe, MD  losartan (COZAAR) 50 MG tablet Take 1 tablet (50 mg total) by mouth daily. 09/08/23 12/07/23  Lanier Prude, MD      Allergies    Ambien [zolpidem] and Erythromycin    Review of Systems   Review of Systems  Physical Exam Updated Vital Signs BP (!) 144/58   Pulse 77   Temp 98.7 F (37.1 C) (Oral)   Resp 16   Wt 63.5 kg   SpO2 98%   BMI 21.29 kg/m  Physical Exam  ED Results / Procedures / Treatments   Labs (all labs ordered are listed, but only abnormal results are displayed) Labs Reviewed - No data to display  EKG None  Radiology No results found.  Procedures Procedures  {Document cardiac  monitor, telemetry assessment procedure when appropriate:1}  Medications Ordered in ED Medications - No data to display  ED Course/ Medical Decision Making/ A&P   {   Click here for ABCD2, HEART and other calculatorsREFRESH Note before signing :1}                              Medical Decision Making Amount and/or Complexity of Data Reviewed Radiology: ordered.   ***  {Document critical care time when appropriate:1} {Document review of labs and clinical decision tools ie heart score, Chads2Vasc2 etc:1}  {Document your independent review of radiology images, and any outside records:1} {Document your discussion with family members, caretakers, and with consultants:1} {Document social determinants of health affecting pt's care:1} {Document your decision making why or why not admission, treatments were needed:1} Final Clinical Impression(s) / ED Diagnoses Final diagnoses:  None    Rx / DC Orders ED Discharge Orders     None

## 2023-10-25 NOTE — ED Triage Notes (Signed)
 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 Last night his thumb started throbbing and it is swollen at the bottom today.

## 2023-10-28 DIAGNOSIS — H6123 Impacted cerumen, bilateral: Secondary | ICD-10-CM | POA: Diagnosis not present

## 2023-11-17 ENCOUNTER — Ambulatory Visit: Attending: Cardiology

## 2023-11-17 DIAGNOSIS — M1811 Unilateral primary osteoarthritis of first carpometacarpal joint, right hand: Secondary | ICD-10-CM | POA: Diagnosis not present

## 2023-11-17 DIAGNOSIS — I5022 Chronic systolic (congestive) heart failure: Secondary | ICD-10-CM

## 2023-11-17 DIAGNOSIS — Z950 Presence of cardiac pacemaker: Secondary | ICD-10-CM | POA: Diagnosis not present

## 2023-11-19 NOTE — Progress Notes (Signed)
 EPIC Encounter for ICM Monitoring  Patient Name: Andrew Marshall is a 89 y.o. male Date: 11/19/2023 Primary Care Physican: Darrow Bussing, MD Primary Cardiologist: Anne Fu Electrophysiologist: Townsend Roger Pacing: 99%            10/30/2022 Weight: 140-143 lbs 01/07/2023 Weight: 140-143 lbs 06/04/2023 Weight: 139-140 lbs 09/12/2023 Office Weight: 143 lbs 10/14/2023 Weight: 140-142 lbs  11/19/2023 Weight: 140 lbs                                                            Spoke with patient and heart failure questions reviewed.  Transmission results reviewed.  Pt asymptomatic for fluid accumulation.  He has some gastric issues.   CorVue thoracic impedance suggesting normal fluid levels with the exception of possible fluid accumulation 3/3-3/10.   Prescribed: No diuretic   Recommendations:  No changes and encouraged to call if experiencing any fluid symptoms.   Follow-up plan: ICM clinic phone appointment on 12/22/2023.  91 day device clinic remote transmission 12/01/2023.     EP/Cardiology Office Visits:  Recall 09/02/2024 with Dr Lalla Brothers.  Recall 09/06/2024 with Dr Anne Fu.     Copy of ICM check sent to Dr Lalla Brothers.   3 month ICM trend: 11/17/2023.    12-14 Month ICM trend:     Karie Soda, RN 11/19/2023 7:43 AM

## 2023-11-25 DIAGNOSIS — Z950 Presence of cardiac pacemaker: Secondary | ICD-10-CM | POA: Diagnosis not present

## 2023-11-25 DIAGNOSIS — R194 Change in bowel habit: Secondary | ICD-10-CM | POA: Diagnosis not present

## 2023-11-25 DIAGNOSIS — I5022 Chronic systolic (congestive) heart failure: Secondary | ICD-10-CM | POA: Diagnosis not present

## 2023-11-25 DIAGNOSIS — E7800 Pure hypercholesterolemia, unspecified: Secondary | ICD-10-CM | POA: Diagnosis not present

## 2023-11-25 DIAGNOSIS — Z79899 Other long term (current) drug therapy: Secondary | ICD-10-CM | POA: Diagnosis not present

## 2023-11-25 DIAGNOSIS — I2510 Atherosclerotic heart disease of native coronary artery without angina pectoris: Secondary | ICD-10-CM | POA: Diagnosis not present

## 2023-11-25 DIAGNOSIS — I10 Essential (primary) hypertension: Secondary | ICD-10-CM | POA: Diagnosis not present

## 2023-11-25 DIAGNOSIS — Z0000 Encounter for general adult medical examination without abnormal findings: Secondary | ICD-10-CM | POA: Diagnosis not present

## 2023-11-25 DIAGNOSIS — I442 Atrioventricular block, complete: Secondary | ICD-10-CM | POA: Diagnosis not present

## 2023-12-01 ENCOUNTER — Ambulatory Visit (INDEPENDENT_AMBULATORY_CARE_PROVIDER_SITE_OTHER): Payer: Medicare Other

## 2023-12-01 DIAGNOSIS — I442 Atrioventricular block, complete: Secondary | ICD-10-CM | POA: Diagnosis not present

## 2023-12-01 LAB — CUP PACEART REMOTE DEVICE CHECK
Battery Remaining Longevity: 35 mo
Battery Remaining Percentage: 39 %
Battery Voltage: 2.96 V
Brady Statistic AP VP Percent: 85 %
Brady Statistic AP VS Percent: 1 %
Brady Statistic AS VP Percent: 14 %
Brady Statistic AS VS Percent: 1 %
Brady Statistic RA Percent Paced: 84 %
Date Time Interrogation Session: 20250407061819
Implantable Lead Connection Status: 753985
Implantable Lead Connection Status: 753985
Implantable Lead Connection Status: 753985
Implantable Lead Implant Date: 20160329
Implantable Lead Implant Date: 20160329
Implantable Lead Implant Date: 20210406
Implantable Lead Location: 753858
Implantable Lead Location: 753859
Implantable Lead Location: 753860
Implantable Lead Model: 1948
Implantable Pulse Generator Implant Date: 20210406
Lead Channel Impedance Value: 360 Ohm
Lead Channel Impedance Value: 600 Ohm
Lead Channel Impedance Value: 630 Ohm
Lead Channel Pacing Threshold Amplitude: 0.5 V
Lead Channel Pacing Threshold Amplitude: 0.625 V
Lead Channel Pacing Threshold Amplitude: 1.5 V
Lead Channel Pacing Threshold Pulse Width: 0.5 ms
Lead Channel Pacing Threshold Pulse Width: 0.5 ms
Lead Channel Pacing Threshold Pulse Width: 0.7 ms
Lead Channel Sensing Intrinsic Amplitude: 12 mV
Lead Channel Sensing Intrinsic Amplitude: 3.1 mV
Lead Channel Setting Pacing Amplitude: 1.5 V
Lead Channel Setting Pacing Amplitude: 2 V
Lead Channel Setting Pacing Amplitude: 2 V
Lead Channel Setting Pacing Pulse Width: 0.5 ms
Lead Channel Setting Pacing Pulse Width: 0.7 ms
Lead Channel Setting Sensing Sensitivity: 4 mV
Pulse Gen Model: 3562
Pulse Gen Serial Number: 6058048

## 2023-12-05 DIAGNOSIS — K5900 Constipation, unspecified: Secondary | ICD-10-CM | POA: Diagnosis not present

## 2023-12-05 DIAGNOSIS — M6281 Muscle weakness (generalized): Secondary | ICD-10-CM | POA: Diagnosis not present

## 2023-12-06 ENCOUNTER — Encounter: Payer: Self-pay | Admitting: Cardiology

## 2023-12-10 ENCOUNTER — Telehealth: Payer: Self-pay | Admitting: *Deleted

## 2023-12-10 NOTE — Telephone Encounter (Signed)
 Received letter from pt requesting approval to take Trazodone.   Reviewed with Dr Renna Cary who gives approval to take it but to ask PCP to prescribe.  Pt is aware and will discuss with his PCP.

## 2023-12-16 DIAGNOSIS — K5900 Constipation, unspecified: Secondary | ICD-10-CM | POA: Diagnosis not present

## 2023-12-16 DIAGNOSIS — M6281 Muscle weakness (generalized): Secondary | ICD-10-CM | POA: Diagnosis not present

## 2023-12-22 ENCOUNTER — Ambulatory Visit: Attending: Cardiology

## 2023-12-22 DIAGNOSIS — Z950 Presence of cardiac pacemaker: Secondary | ICD-10-CM

## 2023-12-22 DIAGNOSIS — I5022 Chronic systolic (congestive) heart failure: Secondary | ICD-10-CM | POA: Diagnosis not present

## 2023-12-24 DIAGNOSIS — I10 Essential (primary) hypertension: Secondary | ICD-10-CM | POA: Diagnosis not present

## 2023-12-24 NOTE — Progress Notes (Signed)
 EPIC Encounter for ICM Monitoring  Patient Name: Andrew Marshall is a 89 y.o. male Date: 12/24/2023 Primary Care Physican: Lanae Pinal, MD Primary Cardiologist: Renna Cary Electrophysiologist: Kasandra Pain Pacing: 99%            10/30/2022 Weight: 140-143 lbs 01/07/2023 Weight: 140-143 lbs 06/04/2023 Weight: 139-140 lbs 09/12/2023 Office Weight: 143 lbs 10/14/2023 Weight: 140-142 lbs  11/19/2023 Weight: 140 lbs 12/22/2023 Weight: 140 lbs                                                           Spoke with patient and heart failure questions reviewed.  Transmission results reviewed.  Pt asymptomatic for fluid accumulation.  He has some low BP such as 90/58 but not experiencing any dizziness or lightheadedness.  Other times his BP may run 130/?    CorVue thoracic impedance suggesting normal fluid levels within the last month.   Prescribed: No diuretic   Recommendations:  No changes and encouraged to call if experiencing any fluid symptoms.   Follow-up plan: ICM clinic phone appointment on 01/26/2024.  91 day device clinic remote transmission 03/01/2024.     EP/Cardiology Office Visits:  Recall 09/02/2024 with Dr Marven Slimmer.  Recall 09/06/2024 with Dr Renna Cary.     Copy of ICM check sent to Dr Marven Slimmer.    3 month ICM trend: 12/22/2023.    12-14 Month ICM trend:     Almyra Jain, RN 12/24/2023 2:53 PM

## 2024-01-07 ENCOUNTER — Ambulatory Visit (HOSPITAL_COMMUNITY): Admit: 2024-01-07 | Payer: Medicare Other | Admitting: Gastroenterology

## 2024-01-07 ENCOUNTER — Encounter (HOSPITAL_COMMUNITY): Payer: Self-pay

## 2024-01-07 SURGERY — MANOMETRY, ANORECTAL
Laterality: Bilateral

## 2024-01-08 DIAGNOSIS — I10 Essential (primary) hypertension: Secondary | ICD-10-CM | POA: Diagnosis not present

## 2024-01-12 NOTE — Progress Notes (Signed)
 Remote pacemaker transmission.

## 2024-01-22 DIAGNOSIS — K5900 Constipation, unspecified: Secondary | ICD-10-CM | POA: Diagnosis not present

## 2024-01-22 DIAGNOSIS — M6281 Muscle weakness (generalized): Secondary | ICD-10-CM | POA: Diagnosis not present

## 2024-01-26 ENCOUNTER — Ambulatory Visit: Attending: Cardiology

## 2024-01-26 DIAGNOSIS — Z950 Presence of cardiac pacemaker: Secondary | ICD-10-CM | POA: Diagnosis not present

## 2024-01-26 DIAGNOSIS — I5022 Chronic systolic (congestive) heart failure: Secondary | ICD-10-CM | POA: Diagnosis not present

## 2024-01-28 ENCOUNTER — Telehealth: Payer: Self-pay

## 2024-01-28 NOTE — Telephone Encounter (Signed)
Remote ICM transmission received.  Attempted call to patient regarding ICM remote transmission and left detailed message per DPR.  Left ICM phone number and advised to return call for any fluid symptoms or questions.

## 2024-01-28 NOTE — Progress Notes (Signed)
 EPIC Encounter for ICM Monitoring  Patient Name: Andrew Marshall is a 89 y.o. male Date: 01/28/2024 Primary Care Physican: Lanae Pinal, MD Primary Cardiologist: Renna Cary Electrophysiologist: Kasandra Pain Pacing: 99%            10/30/2022 Weight: 140-143 lbs 01/07/2023 Weight: 140-143 lbs 06/04/2023 Weight: 139-140 lbs 09/12/2023 Office Weight: 143 lbs 10/14/2023 Weight: 140-142 lbs  11/19/2023 Weight: 140 lbs 12/22/2023 Weight: 140 lbs                                                           Attempted call to patient and unable to reach.  Left detailed message per DPR regarding transmission.  Transmission results reviewed.    CorVue thoracic impedance suggesting possible fluid accumulation from 5/16-5/22 and trending slightly below baseline 5/31.   Prescribed: No diuretic   Recommendations:  Left voice mail with ICM number and encouraged to call if experiencing any fluid symptoms.   Follow-up plan: ICM clinic phone appointment on 03/22/2024.  91 day device clinic remote transmission 03/01/2024.     EP/Cardiology Office Visits:  Recall 09/02/2024 with Dr Marven Slimmer.  Recall 09/06/2024 with Dr Renna Cary.     Copy of ICM check sent to Dr Marven Slimmer.   3 month ICM trend: 01/26/2024.    12-14 Month ICM trend:     Almyra Jain, RN 01/28/2024 10:42 AM

## 2024-01-29 DIAGNOSIS — M6281 Muscle weakness (generalized): Secondary | ICD-10-CM | POA: Diagnosis not present

## 2024-01-29 DIAGNOSIS — K5900 Constipation, unspecified: Secondary | ICD-10-CM | POA: Diagnosis not present

## 2024-02-23 DIAGNOSIS — M6281 Muscle weakness (generalized): Secondary | ICD-10-CM | POA: Diagnosis not present

## 2024-02-23 DIAGNOSIS — I5022 Chronic systolic (congestive) heart failure: Secondary | ICD-10-CM | POA: Diagnosis not present

## 2024-02-23 DIAGNOSIS — K5900 Constipation, unspecified: Secondary | ICD-10-CM | POA: Diagnosis not present

## 2024-02-23 DIAGNOSIS — I10 Essential (primary) hypertension: Secondary | ICD-10-CM | POA: Diagnosis not present

## 2024-02-23 DIAGNOSIS — I2510 Atherosclerotic heart disease of native coronary artery without angina pectoris: Secondary | ICD-10-CM | POA: Diagnosis not present

## 2024-02-23 DIAGNOSIS — E7800 Pure hypercholesterolemia, unspecified: Secondary | ICD-10-CM | POA: Diagnosis not present

## 2024-03-01 ENCOUNTER — Ambulatory Visit: Payer: Medicare Other

## 2024-03-01 DIAGNOSIS — I442 Atrioventricular block, complete: Secondary | ICD-10-CM | POA: Diagnosis not present

## 2024-03-01 LAB — CUP PACEART REMOTE DEVICE CHECK
Battery Remaining Longevity: 32 mo
Battery Remaining Percentage: 35 %
Battery Voltage: 2.95 V
Brady Statistic AP VP Percent: 82 %
Brady Statistic AP VS Percent: 1 %
Brady Statistic AS VP Percent: 17 %
Brady Statistic AS VS Percent: 1 %
Brady Statistic RA Percent Paced: 81 %
Date Time Interrogation Session: 20250707040010
Implantable Lead Connection Status: 753985
Implantable Lead Connection Status: 753985
Implantable Lead Connection Status: 753985
Implantable Lead Implant Date: 20160329
Implantable Lead Implant Date: 20160329
Implantable Lead Implant Date: 20210406
Implantable Lead Location: 753858
Implantable Lead Location: 753859
Implantable Lead Location: 753860
Implantable Lead Model: 1948
Implantable Pulse Generator Implant Date: 20210406
Lead Channel Impedance Value: 360 Ohm
Lead Channel Impedance Value: 580 Ohm
Lead Channel Impedance Value: 630 Ohm
Lead Channel Pacing Threshold Amplitude: 0.5 V
Lead Channel Pacing Threshold Amplitude: 0.625 V
Lead Channel Pacing Threshold Amplitude: 1.375 V
Lead Channel Pacing Threshold Pulse Width: 0.5 ms
Lead Channel Pacing Threshold Pulse Width: 0.5 ms
Lead Channel Pacing Threshold Pulse Width: 0.7 ms
Lead Channel Sensing Intrinsic Amplitude: 1.8 mV
Lead Channel Sensing Intrinsic Amplitude: 12 mV
Lead Channel Setting Pacing Amplitude: 1.5 V
Lead Channel Setting Pacing Amplitude: 2 V
Lead Channel Setting Pacing Amplitude: 2 V
Lead Channel Setting Pacing Pulse Width: 0.5 ms
Lead Channel Setting Pacing Pulse Width: 0.7 ms
Lead Channel Setting Sensing Sensitivity: 4 mV
Pulse Gen Model: 3562
Pulse Gen Serial Number: 6058048

## 2024-03-02 DIAGNOSIS — M6281 Muscle weakness (generalized): Secondary | ICD-10-CM | POA: Diagnosis not present

## 2024-03-02 DIAGNOSIS — K5900 Constipation, unspecified: Secondary | ICD-10-CM | POA: Diagnosis not present

## 2024-03-06 ENCOUNTER — Ambulatory Visit: Payer: Self-pay | Admitting: Cardiology

## 2024-03-23 ENCOUNTER — Ambulatory Visit: Attending: Cardiology

## 2024-03-23 DIAGNOSIS — I5022 Chronic systolic (congestive) heart failure: Secondary | ICD-10-CM | POA: Diagnosis not present

## 2024-03-23 DIAGNOSIS — Z950 Presence of cardiac pacemaker: Secondary | ICD-10-CM

## 2024-03-25 ENCOUNTER — Telehealth: Payer: Self-pay

## 2024-03-25 DIAGNOSIS — I5022 Chronic systolic (congestive) heart failure: Secondary | ICD-10-CM | POA: Diagnosis not present

## 2024-03-25 DIAGNOSIS — I2510 Atherosclerotic heart disease of native coronary artery without angina pectoris: Secondary | ICD-10-CM | POA: Diagnosis not present

## 2024-03-25 DIAGNOSIS — M6281 Muscle weakness (generalized): Secondary | ICD-10-CM | POA: Diagnosis not present

## 2024-03-25 DIAGNOSIS — E7800 Pure hypercholesterolemia, unspecified: Secondary | ICD-10-CM | POA: Diagnosis not present

## 2024-03-25 DIAGNOSIS — I10 Essential (primary) hypertension: Secondary | ICD-10-CM | POA: Diagnosis not present

## 2024-03-25 NOTE — Telephone Encounter (Signed)
 Remote ICM transmission received.  Attempted call to patient regarding ICM remote transmission and left detailed message per DPR.  Left ICM phone number and advised to return call for any fluid symptoms or questions. Next ICM remote transmission scheduled 04/27/2024.

## 2024-03-25 NOTE — Progress Notes (Signed)
 EPIC Encounter for ICM Monitoring  Patient Name: Andrew Marshall is a 89 y.o. male Date: 03/25/2024 Primary Care Physican: Regino Slater, MD Primary Cardiologist: Jeffrie Electrophysiologist: Cindie Pore Pacing: 99%            10/30/2022 Weight: 140-143 lbs 01/07/2023 Weight: 140-143 lbs 06/04/2023 Weight: 139-140 lbs 09/12/2023 Office Weight: 143 lbs 10/14/2023 Weight: 140-142 lbs  11/19/2023 Weight: 140 lbs 12/22/2023 Weight: 140 lbs                                                           Attempted call to patient and unable to reach.  Left detailed message per DPR regarding transmission.  Transmission results reviewed.    CorVue thoracic impedance suggesting normal fluid levels since 7/8.   Prescribed: No diuretic   Recommendations:  Left voice mail with ICM number and encouraged to call if experiencing any fluid symptoms.   Follow-up plan: ICM clinic phone appointment on 04/27/2024.  91 day device clinic remote transmission 10/01/31/2024.     EP/Cardiology Office Visits:  Recall 09/02/2024 with Dr Cindie.  Recall 09/06/2024 with Dr Jeffrie.     Copy of ICM check sent to Dr Cindie.   3 month ICM trend: 03/22/2024.    12-14 Month ICM trend:     Andrew GORMAN Garner, RN 03/25/2024 12:58 PM

## 2024-04-12 ENCOUNTER — Telehealth: Payer: Self-pay

## 2024-04-12 NOTE — Telephone Encounter (Signed)
 Returned pt call as requested by voice mail message.  Pt asked if donating blood would have any negative effect on his device.  Advised he can donate blood and it will not effect his device.  He appreciated the call back.

## 2024-04-15 DIAGNOSIS — M6281 Muscle weakness (generalized): Secondary | ICD-10-CM | POA: Diagnosis not present

## 2024-04-15 DIAGNOSIS — K5900 Constipation, unspecified: Secondary | ICD-10-CM | POA: Diagnosis not present

## 2024-04-25 DIAGNOSIS — E7800 Pure hypercholesterolemia, unspecified: Secondary | ICD-10-CM | POA: Diagnosis not present

## 2024-04-25 DIAGNOSIS — I10 Essential (primary) hypertension: Secondary | ICD-10-CM | POA: Diagnosis not present

## 2024-04-25 DIAGNOSIS — I5022 Chronic systolic (congestive) heart failure: Secondary | ICD-10-CM | POA: Diagnosis not present

## 2024-04-25 DIAGNOSIS — I2510 Atherosclerotic heart disease of native coronary artery without angina pectoris: Secondary | ICD-10-CM | POA: Diagnosis not present

## 2024-04-27 ENCOUNTER — Ambulatory Visit: Attending: Cardiology

## 2024-04-27 DIAGNOSIS — Z950 Presence of cardiac pacemaker: Secondary | ICD-10-CM

## 2024-04-27 DIAGNOSIS — I5022 Chronic systolic (congestive) heart failure: Secondary | ICD-10-CM

## 2024-04-28 DIAGNOSIS — K5900 Constipation, unspecified: Secondary | ICD-10-CM | POA: Diagnosis not present

## 2024-04-28 DIAGNOSIS — R197 Diarrhea, unspecified: Secondary | ICD-10-CM | POA: Diagnosis not present

## 2024-04-28 NOTE — Progress Notes (Signed)
 EPIC Encounter for ICM Monitoring  Patient Name: Andrew Marshall is a 89 y.o. male Date: 04/28/2024 Primary Care Physican: Regino Slater, MD Primary Cardiologist: Jeffrie Electrophysiologist: Cindie Pore Pacing: 99%            10/30/2022 Weight: 140-143 lbs 01/07/2023 Weight: 140-143 lbs 06/04/2023 Weight: 139-140 lbs 09/12/2023 Office Weight: 143 lbs 10/14/2023 Weight: 140-142 lbs  11/19/2023 Weight: 140 lbs 12/22/2023 Weight: 140 lbs   04/27/2024 Weight: 135 - 140 lbs                                                         Spoke with patient and heart failure questions reviewed.  Transmission results reviewed.  Pt asymptomatic for fluid accumulation.  Reports feeling well at this time and voices no complaints.     CorVue thoracic impedance suggesting normal fluid levels within the last month.   Prescribed: No diuretic   Recommendations:  No changes and encouraged to call if experiencing any fluid symptoms.   Follow-up plan: ICM clinic phone appointment on 06/07/2024.  91 day device clinic remote transmission 05/31/2024.     EP/Cardiology Office Visits:  Recall 09/02/2024 with Dr Cindie.  Recall 09/06/2024 with Dr Jeffrie.     Copy of ICM check sent to Dr Cindie.   3 month ICM trend: 04/27/2024.    12-14 Month ICM trend:     Mitzie GORMAN Garner, RN 04/28/2024 9:46 AM

## 2024-05-12 DIAGNOSIS — K5900 Constipation, unspecified: Secondary | ICD-10-CM | POA: Diagnosis not present

## 2024-05-12 DIAGNOSIS — M6281 Muscle weakness (generalized): Secondary | ICD-10-CM | POA: Diagnosis not present

## 2024-05-12 DIAGNOSIS — R350 Frequency of micturition: Secondary | ICD-10-CM | POA: Diagnosis not present

## 2024-05-14 DIAGNOSIS — A09 Infectious gastroenteritis and colitis, unspecified: Secondary | ICD-10-CM | POA: Diagnosis not present

## 2024-05-17 ENCOUNTER — Other Ambulatory Visit: Payer: Self-pay | Admitting: Cardiology

## 2024-05-18 MED ORDER — CLOPIDOGREL BISULFATE 75 MG PO TABS: 75.0000 mg | ORAL_TABLET | Freq: Every day | ORAL | 0 refills | Status: DC

## 2024-05-19 DIAGNOSIS — K5900 Constipation, unspecified: Secondary | ICD-10-CM | POA: Diagnosis not present

## 2024-05-19 DIAGNOSIS — R197 Diarrhea, unspecified: Secondary | ICD-10-CM | POA: Diagnosis not present

## 2024-05-25 DIAGNOSIS — K5900 Constipation, unspecified: Secondary | ICD-10-CM | POA: Diagnosis not present

## 2024-05-25 DIAGNOSIS — I2510 Atherosclerotic heart disease of native coronary artery without angina pectoris: Secondary | ICD-10-CM | POA: Diagnosis not present

## 2024-05-25 DIAGNOSIS — M6281 Muscle weakness (generalized): Secondary | ICD-10-CM | POA: Diagnosis not present

## 2024-05-25 DIAGNOSIS — I5022 Chronic systolic (congestive) heart failure: Secondary | ICD-10-CM | POA: Diagnosis not present

## 2024-05-25 DIAGNOSIS — E7800 Pure hypercholesterolemia, unspecified: Secondary | ICD-10-CM | POA: Diagnosis not present

## 2024-05-25 DIAGNOSIS — I10 Essential (primary) hypertension: Secondary | ICD-10-CM | POA: Diagnosis not present

## 2024-05-31 ENCOUNTER — Ambulatory Visit: Payer: Medicare Other

## 2024-05-31 DIAGNOSIS — I5022 Chronic systolic (congestive) heart failure: Secondary | ICD-10-CM | POA: Diagnosis not present

## 2024-06-02 LAB — CUP PACEART REMOTE DEVICE CHECK
Battery Remaining Longevity: 29 mo
Battery Remaining Percentage: 32 %
Battery Voltage: 2.95 V
Brady Statistic AP VP Percent: 81 %
Brady Statistic AP VS Percent: 1 %
Brady Statistic AS VP Percent: 18 %
Brady Statistic AS VS Percent: 1 %
Brady Statistic RA Percent Paced: 80 %
Date Time Interrogation Session: 20251007015009
Implantable Lead Connection Status: 753985
Implantable Lead Connection Status: 753985
Implantable Lead Connection Status: 753985
Implantable Lead Implant Date: 20160329
Implantable Lead Implant Date: 20160329
Implantable Lead Implant Date: 20210406
Implantable Lead Location: 753858
Implantable Lead Location: 753859
Implantable Lead Location: 753860
Implantable Lead Model: 1948
Implantable Pulse Generator Implant Date: 20210406
Lead Channel Impedance Value: 360 Ohm
Lead Channel Impedance Value: 630 Ohm
Lead Channel Impedance Value: 630 Ohm
Lead Channel Pacing Threshold Amplitude: 0.5 V
Lead Channel Pacing Threshold Amplitude: 0.625 V
Lead Channel Pacing Threshold Amplitude: 1.375 V
Lead Channel Pacing Threshold Pulse Width: 0.5 ms
Lead Channel Pacing Threshold Pulse Width: 0.5 ms
Lead Channel Pacing Threshold Pulse Width: 0.7 ms
Lead Channel Sensing Intrinsic Amplitude: 12 mV
Lead Channel Sensing Intrinsic Amplitude: 2.2 mV
Lead Channel Setting Pacing Amplitude: 1.5 V
Lead Channel Setting Pacing Amplitude: 2 V
Lead Channel Setting Pacing Amplitude: 2 V
Lead Channel Setting Pacing Pulse Width: 0.5 ms
Lead Channel Setting Pacing Pulse Width: 0.7 ms
Lead Channel Setting Sensing Sensitivity: 4 mV
Pulse Gen Model: 3562
Pulse Gen Serial Number: 6058048

## 2024-06-02 NOTE — Progress Notes (Signed)
 Remote PPM Transmission

## 2024-06-07 ENCOUNTER — Ambulatory Visit: Attending: Cardiology

## 2024-06-07 ENCOUNTER — Ambulatory Visit: Payer: Self-pay | Admitting: Cardiology

## 2024-06-07 DIAGNOSIS — Z950 Presence of cardiac pacemaker: Secondary | ICD-10-CM

## 2024-06-07 DIAGNOSIS — I5022 Chronic systolic (congestive) heart failure: Secondary | ICD-10-CM

## 2024-06-07 NOTE — Progress Notes (Signed)
 Remote PPM Transmission

## 2024-06-09 NOTE — Progress Notes (Signed)
 EPIC Encounter for ICM Monitoring  Patient Name: Andrew Marshall is a 89 y.o. male Date: 06/09/2024 Primary Care Physican: Regino Slater, MD Primary Cardiologist: Jeffrie Electrophysiologist: Cindie Pore Pacing: 99%            10/30/2022 Weight: 140-143 lbs 01/07/2023 Weight: 140-143 lbs 06/04/2023 Weight: 139-140 lbs 09/12/2023 Office Weight: 143 lbs 10/14/2023 Weight: 140-142 lbs  11/19/2023 Weight: 140 lbs 12/22/2023 Weight: 140 lbs   04/27/2024 Weight: 135 - 140 lbs                                                         Spoke with patient and heart failure questions reviewed.  Transmission results reviewed.  Pt asymptomatic for fluid accumulation.  Reports feeling well at this time and voices no complaints.     CorVue thoracic impedance suggesting normal fluid levels within the last month.   Prescribed: No diuretic   Recommendations:  No changes and encouraged to call if experiencing any fluid symptoms.   Follow-up plan: ICM clinic phone appointment on 07/12/2024.  91 day device clinic remote transmission 08/30/2024.     EP/Cardiology Office Visits:  Recall 09/02/2024 with Dr Cindie.  Recall 09/06/2024 with Dr Jeffrie.     Copy of ICM check sent to Dr Cindie.   Remote monitoring is medically necessary for Heart Failure Management.    Daily Thoracic Impedance ICM trend: 03/09/2024 through 06/07/2024.    12-14 Month Thoracic Impedance ICM trend:     Mitzie GORMAN Garner, RN 06/09/2024 4:56 PM

## 2024-07-08 ENCOUNTER — Emergency Department (HOSPITAL_BASED_OUTPATIENT_CLINIC_OR_DEPARTMENT_OTHER): Admitting: Radiology

## 2024-07-08 ENCOUNTER — Emergency Department (HOSPITAL_BASED_OUTPATIENT_CLINIC_OR_DEPARTMENT_OTHER)

## 2024-07-08 ENCOUNTER — Emergency Department (HOSPITAL_BASED_OUTPATIENT_CLINIC_OR_DEPARTMENT_OTHER): Admission: EM | Admit: 2024-07-08 | Discharge: 2024-07-08 | Disposition: A | Discharge status: Home / Self Care

## 2024-07-08 ENCOUNTER — Other Ambulatory Visit: Payer: Self-pay

## 2024-07-08 DIAGNOSIS — M79643 Pain in unspecified hand: Secondary | ICD-10-CM

## 2024-07-08 DIAGNOSIS — W1830XA Fall on same level, unspecified, initial encounter: Secondary | ICD-10-CM | POA: Diagnosis not present

## 2024-07-08 DIAGNOSIS — S0990XA Unspecified injury of head, initial encounter: Secondary | ICD-10-CM | POA: Diagnosis present

## 2024-07-08 DIAGNOSIS — M25531 Pain in right wrist: Secondary | ICD-10-CM | POA: Insufficient documentation

## 2024-07-08 DIAGNOSIS — S0081XA Abrasion of other part of head, initial encounter: Secondary | ICD-10-CM | POA: Insufficient documentation

## 2024-07-08 DIAGNOSIS — S060X0A Concussion without loss of consciousness, initial encounter: Secondary | ICD-10-CM | POA: Insufficient documentation

## 2024-07-08 MED ORDER — LIDOCAINE 5 % EX PTCH
1.0000 | MEDICATED_PATCH | Freq: Once | CUTANEOUS | Status: DC
  Administered 2024-07-08: 1 via TRANSDERMAL
  Filled 2024-07-08: qty 1

## 2024-07-08 MED ORDER — ACETAMINOPHEN 500 MG PO TABS
1000.0000 mg | ORAL_TABLET | Freq: Once | ORAL | Status: AC
  Administered 2024-07-08: 1000 mg via ORAL
  Filled 2024-07-08: qty 2

## 2024-07-08 MED ORDER — LIDOCAINE 5 % EX PTCH: 1.0000 | MEDICATED_PATCH | CUTANEOUS | 0 refills | Status: AC

## 2024-07-08 NOTE — ED Provider Notes (Signed)
 [place] EMERGENCY DEPARTMENT AT [facility] Provider Note   CSN: [id number] Arrival date & time: 07/08/24  1432     Patient presents with: Andrew Marshall is a 89 y.o. male.   Is a 89 year old male presenting emergency department after a mechanical fall 4 hours prior to arrival.   mechanical fall.  His toe got caught on the ground.  He fell onto his right side did hit his head, no LOC.  Is on Plavix .  No nausea vomiting.  Family who are in the room states he is acting his normal self.  He has some right sided chest wall pain, but no shortness of breath.  He was able to ambulate after falling with no pain to his lower extremities.  Reports some pain to the base of his right thumb.  No numbness tingling changes in sensation.   Fall       Prior to Admission medications   Medication Sig Start Date End Date Taking? Authorizing Provider  lidocaine  (LIDODERM ) 5 % Place 1 patch onto the skin daily. Remove & Discard patch within 12 hours or as directed by MD 07/08/24  Yes Neysa Caron PARAS, DO  atorvastatin  (LIPITOR ) 40 MG tablet TAKE 1 TABLET BY MOUTH DAILY 05/18/24   Jeffrie Oneil BROCKS, MD  Carboxymethylcellul-Glycerin (LUBRICATING EYE DROPS OP) Place 1 drop into both eyes daily as needed (dry eyes).    [provider]  carvedilol  (COREG ) 12.5 MG tablet TAKE ONE TABLET BY MOUTH TWO TIMES A DAY WITH A MEAL 10/15/23   Jeffrie Oneil BROCKS, MD  cetirizine (ZYRTEC) 10 MG tablet Take 10 mg by mouth daily.    [provider]  Cholecalciferol (VITAMIN D3) 25 MCG (1000 UT) CAPS Take 1,000 Units by mouth daily.    [provider]  clopidogrel  (PLAVIX ) 75 MG tablet Take 1 tablet (75 mg total) by mouth daily. 05/18/24   Jeffrie Oneil BROCKS, MD  losartan  (COZAAR ) 50 MG tablet Take 1 tablet (50 mg total) by mouth daily. 09/08/23 12/07/23  Cindie Ole DASEN, MD    Allergies: Ambien [zolpidem] and Erythromycin    Review of Systems  Updated Vital Signs BP (!) 145/59   Pulse  (!) 57   Temp 98.3 F (36.8 C)   Resp 18   SpO2 99%   Physical Exam Vitals and nursing note reviewed.  Constitutional:      General: She is not in acute distress.    Appearance: She is not toxic-appearing.  HENT:     Head: Normocephalic and atraumatic.     Comments: Superficial abrasions over right forehead.    Nose: Nose normal.     Mouth/Throat:     Mouth: Mucous membranes are moist.  Eyes:     Extraocular Movements: Extraocular movements intact.     Conjunctiva/sclera: Conjunctivae normal.     Pupils: Pupils are equal, round, and reactive to light.  Cardiovascular:     Rate and Rhythm: Normal rate and regular rhythm.     Pulses: Normal pulses.  Pulmonary:     Effort: Pulmonary effort is normal.     Breath sounds: Normal breath sounds.  Abdominal:     General: Abdomen is flat. There is no distension.     Palpations: Abdomen is soft.     Tenderness: There is no abdominal tenderness. There is no guarding or rebound.  Musculoskeletal:     Cervical back: Normal range of motion. No tenderness.     Comments: No bony tenderness  to the extremities other than the to the anatomic snuffbox of the right hand.  Does have some superficial abrasions to hands as well.  Neurovascularly intact.  Equal pulses in all extremities.  He has no midline spinal tenderness in the thoracic or lumbar spine, 5 out of 5 grip strength, 5 out of 5 plantarflexion dorsiflexion.  Skin:    General: Skin is warm and dry.     Capillary Refill: Capillary refill takes less than 2 seconds.  Neurological:     Mental Status: She is alert and oriented to person, place, and time.  Psychiatric:        Mood and Affect: Mood normal.        Behavior: Behavior normal.     (all labs ordered are listed, but only abnormal results are displayed) Labs Reviewed - No data to display  EKG: None  Radiology: DG Wrist Complete Right Result Date: 07/08/2024 CLINICAL DATA:  Fall.  Right wrist pain. EXAM: RIGHT WRIST -  COMPLETE 3+ VIEW COMPARISON:  Right hand radiograph dated 10/25/2023. FINDINGS: There is no acute fracture or dislocation. The bones are osteopenic. There is arthritic changes of the base of the thumb. The soft tissues are unremarkable. IMPRESSION: 1. No acute fracture or dislocation. 2. Osteopenia. Electronically Signed   By: Vanetta Chou M.D.   On: 07/08/2024 17:16   DG Ribs Unilateral Right Result Date: 07/08/2024 CLINICAL DATA:  Trauma and chest wall pain. EXAM: RIGHT RIBS - 2 VIEW COMPARISON:  Chest radiograph dated 07/08/2024. FINDINGS: No acute osseous pathology.  No displaced rib fractures. IMPRESSION: Negative. Electronically Signed   By: Vanetta Chou M.D.   On: 07/08/2024 17:15   DG Chest 2 View Result Date: 07/08/2024 CLINICAL DATA:  Fall 4 hours ago onto right ribs.  On Plavix . EXAM: CHEST - 2 VIEW COMPARISON:  08/15/2023 FINDINGS: Left-sided pacemaker unchanged. Lungs are adequately inflated without focal airspace consolidation or effusion. Cardiomediastinal silhouette and remainder of the exam is unchanged. IMPRESSION: No acute cardiopulmonary disease. Electronically Signed   By: Toribio Agreste M.D.   On: 07/08/2024 16:05   CT Head Wo Contrast Result Date: 07/08/2024 EXAM: CT HEAD WITHOUT CONTRAST 07/08/2024 03:26:00 PM TECHNIQUE: CT of the head was performed without the administration of intravenous contrast. Automated exposure control, iterative reconstruction, and/or weight based adjustment of the mA/kV was utilized to reduce the radiation dose to as low as reasonably achievable. COMPARISON: CT temporal bones 08/03/2020. CLINICAL HISTORY: Head trauma, minor (Age >= 65y). FINDINGS: BRAIN AND VENTRICLES: No acute intracranial hemorrhage. No evidence of acute infarct. Scattered white matter hypodensities which are nonspecific but most commonly represent chronic microvascular ischemic changes. No hydrocephalus. No extra-axial collection. No mass effect or midline shift. ORBITS: No  acute abnormality. SINUSES: No acute abnormality. SOFT TISSUES AND SKULL: There is infiltration of the right periorbital scalp soft tissues, possibly contusive change. No skull fracture. IMPRESSION: 1. No acute intracranial hemorrhage or calvarial fracture. Electronically signed by: prentice bybordi 07/08/2024 04:03 PM EST RP Workstation: [id number]   CT Cervical Spine Wo Contrast Result Date: 07/08/2024 EXAM: CT CERVICAL SPINE WITHOUT CONTRAST 07/08/2024 03:26:00 PM TECHNIQUE: CT of the cervical spine was performed without the administration of intravenous contrast. Multiplanar reformatted images are provided for review. Automated exposure control, iterative reconstruction, and/or weight based adjustment of the mA/kV was utilized to reduce the radiation dose to as low as reasonably achievable. COMPARISON: None available. CLINICAL HISTORY: Neck trauma (Age >= 65y). Fall onto concrete. On Plavix . FINDINGS: CERVICAL SPINE: BONES AND  ALIGNMENT: Straightening of the normal cervical lordosis. No significant listhesis. No acute fracture or suspicious lesion. DEGENERATIVE CHANGES: Moderate multilevel disc degeneration. Multilevel facet arthrosis, most severe at C2-C3 and C3-C4 on the left. No suspected high-grade spinal canal stenosis. SOFT TISSUES: No prevertebral soft tissue swelling. IMPRESSION: 1. No acute cervical spine fracture or traumatic malalignment. Electronically signed by: Dasie Hamburg MD 07/08/2024 03:54 PM EST RP Workstation: [id number]     Procedures   Medications Ordered in the ED  acetaminophen  (TYLENOL ) tablet 1,000 mg (1,000 mg Oral Given 07/08/24 1551)    Clinical Course as of 07/08/24 2344  Thu Jul 08, 2024  1544 CT Head Wo Contrast Do not appreciate obvious intracranial hemorrhage/trauma on my independent review of images. [TY]  1546 DG Chest 2 View Do not appreciate pneumothorax traumatic injury on my independent review of chest x-ray.  Does have some tenderness to the right will add  on rib series [TY]  1624 Radiology has also read the CT head cervical spine chest x-ray.  No acute traumatic injury further read. [TY]  2344 X-rays to wrist and ribs negative.  Given incentive by incentive spirometer.  Placed in thumb spica.  Follow-up with Ortho [TY]    Clinical Course User Index [TY] Neysa Caron PARAS, DO                                 Medical Decision Making This is a 89 year old male presenting emergency department for evaluation after fall.  He is afebrile nontachycardic, hemodynamically stable.  Per chart review is on Plavix .  Fall seemingly mechanical in nature.  He is overall well-appearing with some superficial abrasions to his right forehead.  Also having some pain to his right chest wall and to the his right anatomic snuffbox.  However no other tenderness or injuries identified on exam.  He has a benign abdominal exam, his family member present in room states he is acting his normal self.  Will get CT head/C-spine to evaluate for traumatic injury given patient's age and being on Plavix .  Will also get x-rays to evaluate for rib fractures and scaphoid fracture.  Discussed need for thumb spica and Ortho follow-up given clinical exam.  Please understanding.  Will place patient in thumb spica and have him follow-up with Ortho.  Given Tylenol  and lidocaine  for pain.  See ED course for further MDM and final disposition.  Amount and/or Complexity of Data Reviewed External Data Reviewed:     Details: Appears to be on Plavix . Labs:     Details: Considered labs, however clinically well-appearing with normal vital signs, benign abdominal exam in the setting of a mechanical fall.  Labs unlikely to change management or disposition as I do not believe metabolic or infectious process caused his fall.  Also, I have low suspicion for acute intrathoracic or abdominal traumatic injury based off of history and physical exam as well. Radiology: ordered and independent interpretation performed.  Decision-making details documented in ED Course.    Details: See ED course  Risk OTC drugs. Prescription drug management. Decision regarding hospitalization. Diagnosis or treatment significantly limited by social determinants of health.       Final diagnoses:  Concussion without loss of consciousness, initial encounter  Tenderness of anatomical snuffbox    ED Discharge Orders          Ordered    lidocaine  (LIDODERM ) 5 %  Every 24 hours  07/08/24 1727               Neysa Caron PARAS, DO 07/08/24 2344

## 2024-07-08 NOTE — ED Triage Notes (Signed)
 Patient states fall about 4 hours ago onto concrete. Takes plavix . Hit head. Also states hit right side of ribs.

## 2024-07-08 NOTE — Discharge Instructions (Addendum)
 You may take over-the-counter pain medication such as Tylenol  every 6-8 hours for baseline pain control.  You may use the lidocaine  patches for further pain relief.  Please follow-up with the orthopedic doctor for reevaluation of your wrist/thumb.  Remain in the splint until you are seen by them.  Return for fevers, chills, severe headache, vision loss, facial droop, worsening chest pain, difficulty breathing, lightheadedness, passout, severe pain, abdominal pain, nausea or vomiting or any new or worsening symptoms that are concerning to you.  Please use the incentive inspirometer frequently to help prevent pneumonia.

## 2024-07-12 ENCOUNTER — Ambulatory Visit: Attending: Cardiology

## 2024-07-12 DIAGNOSIS — Z950 Presence of cardiac pacemaker: Secondary | ICD-10-CM

## 2024-07-12 DIAGNOSIS — I5022 Chronic systolic (congestive) heart failure: Secondary | ICD-10-CM | POA: Diagnosis not present

## 2024-07-14 NOTE — Progress Notes (Signed)
 EPIC Encounter for ICM Monitoring  Patient Name: Andrew Marshall is a 89 y.o. male Date: 07/14/2024 Primary Care Physican: Regino Slater, MD Primary Cardiologist: Jeffrie Electrophysiologist: Cindie Pore Pacing: 99%            10/30/2022 Weight: 140-143 lbs 01/07/2023 Weight: 140-143 lbs 06/04/2023 Weight: 139-140 lbs 09/12/2023 Office Weight: 143 lbs 10/14/2023 Weight: 140-142 lbs  11/19/2023 Weight: 140 lbs 12/22/2023 Weight: 140 lbs   04/27/2024 Weight: 135 - 140 lbs                                                         Spoke with patient and heart failure questions reviewed.  Transmission results reviewed.  Pt asymptomatic for fluid accumulation.  Reports feeling well at this time and voices no complaints.    07/08/2024 ED visit for fall   Since 06/07/2024 ICM Remote Transmission: CorVue thoracic impedance suggesting normal fluid levels.   Prescribed: No diuretic   Recommendations:  No changes and encouraged to call if experiencing any fluid symptoms.   Follow-up plan: ICM clinic phone appointment on 08/16/2024.  91 day device clinic remote transmission 08/30/2024.     EP/Cardiology Office Visits:  Recall 09/02/2024 with Dr Cindie.  09/08/2024 with Dr Jeffrie.     Copy of ICM check sent to Dr Cindie.   Remote monitoring is medically necessary for Heart Failure Management.    Daily Thoracic Impedance ICM trend: 04/13/2024 through 07/12/2024.    12-14 Month Thoracic Impedance ICM trend:     Mitzie GORMAN Garner, RN 07/14/2024 7:16 AM

## 2024-07-28 ENCOUNTER — Encounter (HOSPITAL_COMMUNITY): Payer: Self-pay | Admitting: General Surgery

## 2024-08-15 ENCOUNTER — Other Ambulatory Visit: Payer: Self-pay | Admitting: Cardiology

## 2024-08-16 ENCOUNTER — Telehealth: Payer: Self-pay

## 2024-08-16 ENCOUNTER — Ambulatory Visit: Attending: Cardiovascular Disease

## 2024-08-16 DIAGNOSIS — I5022 Chronic systolic (congestive) heart failure: Secondary | ICD-10-CM

## 2024-08-16 DIAGNOSIS — Z950 Presence of cardiac pacemaker: Secondary | ICD-10-CM | POA: Diagnosis not present

## 2024-08-16 NOTE — Progress Notes (Signed)
 EPIC Encounter for ICM Monitoring  Patient Name: Andrew Marshall is a 89 y.o. male Date: 08/16/2024 Primary Care Physican: Regino Slater, MD Primary Cardiologist: Jeffrie Electrophysiologist: Mealor Bi-V Pacing: 99%            10/30/2022 Weight: 140-143 lbs 01/07/2023 Weight: 140-143 lbs 06/04/2023 Weight: 139-140 lbs 09/12/2023 Office Weight: 143 lbs 10/14/2023 Weight: 140-142 lbs  11/19/2023 Weight: 140 lbs 12/22/2023 Weight: 140 lbs   04/27/2024 Weight: 135 - 140 lbs                                                         Attempted call to patient and unable to reach.   Transmission results reviewed.        Since 07/12/2024 ICM Remote Transmission: CorVue thoracic impedance suggesting normal fluid levels with the exception of possible fluid accumulation starting 08/12/2024 and returned to baseline 08/15/2024.   Prescribed: No diuretic   Recommendations: Unable to reach.     Follow-up plan: ICM clinic phone appointment on 09/20/2024.  91 day device clinic remote transmission 08/30/2024.     EP/Cardiology Office Visits:  09/03/2024 with Jodie Passey, PA.  09/08/2024 with Dr Jeffrie.     Copy of ICM check sent to Dr Nancey.   Remote monitoring is medically necessary for Heart Failure Management.    Daily Thoracic Impedance ICM trend: 05/18/2024 through 08/16/2024.    12-14 Month Thoracic Impedance ICM trend:     Andrew GORMAN Garner, RN 08/16/2024 11:08 AM

## 2024-08-16 NOTE — Telephone Encounter (Signed)
 Remote ICM transmission received.  Attempted call to patient regarding ICM remote transmission and no answer.

## 2024-08-23 ENCOUNTER — Ambulatory Visit

## 2024-08-24 ENCOUNTER — Telehealth (HOSPITAL_BASED_OUTPATIENT_CLINIC_OR_DEPARTMENT_OTHER): Payer: Self-pay | Admitting: *Deleted

## 2024-08-24 NOTE — Telephone Encounter (Signed)
" ° °  Name: Andrew Marshall  DOB: [birth year]  MRN: [id number]  Primary Cardiologist: Oneil Parchment, MD  Chart reviewed as part of pre-operative protocol coverage. Because of Andrew Marshall's past medical history and time since last visit, she will require a follow-up in-office visit in order to better assess preoperative cardiovascular risk.  Pre-op covering staff: - Please schedule appointment and call patient to inform them. If patient already had an upcoming appointment within acceptable timeframe, please add pre-op clearance to the appointment notes so provider is aware. - Please contact requesting surgeon's office via preferred method (i.e, phone, fax) to inform them of need for appointment prior to surgery.  He is on Plavix  monotherapy for nonobstructive coronary artery disease.  Can hold Plavix  x 5 days prior to procedure as long as asymptomatic at the time of office visit.  Please resume when medically safe to do so.  Andrew LOISE Fabry, PA-C  08/24/2024, 3:53 PM   "

## 2024-08-24 NOTE — Telephone Encounter (Signed)
"  ° °  Pre-operative Risk Assessment    Patient Name: Andrew Marshall  DOB: [birth year] MRN: [id number]   Date of last office visit: 09/12/23 DR. SKAINS Date of next office visit: 09/03/24 JODIE PASSEY, [facility] AND 09/08/24 DR. SKAINS 1 YR F/U   Request for Surgical Clearance    Procedure:  COLONOSCOPY  Date of Surgery:  Clearance TBD                                Surgeon:  DR. ELICIA Surgeon's Group or Practice Name:  [facility] Phone number:  [phone] Fax number:  [phone]   Type of Clearance Requested:   - Medical  - Pharmacy:  Hold Clopidogrel  (Plavix )     Type of Anesthesia:  PROPOFOL     Additional requests/questions:    Bonney Niels Jest   08/24/2024, 1:03 PM   "

## 2024-08-24 NOTE — Telephone Encounter (Signed)
 PREOP has been added to appointment notes.

## 2024-08-30 ENCOUNTER — Ambulatory Visit: Payer: Medicare Other | Attending: Cardiology

## 2024-08-30 DIAGNOSIS — I5022 Chronic systolic (congestive) heart failure: Secondary | ICD-10-CM

## 2024-08-31 LAB — CUP PACEART REMOTE DEVICE CHECK
Battery Remaining Longevity: 26 mo
Battery Remaining Percentage: 29 %
Battery Voltage: 2.93 V
Brady Statistic AP VP Percent: 81 %
Brady Statistic AP VS Percent: 1 %
Brady Statistic AS VP Percent: 18 %
Brady Statistic AS VS Percent: 1 %
Brady Statistic RA Percent Paced: 80 %
Date Time Interrogation Session: 20260105040008
Implantable Lead Connection Status: 753985
Implantable Lead Connection Status: 753985
Implantable Lead Connection Status: 753985
Implantable Lead Implant Date: 20160329
Implantable Lead Implant Date: 20160329
Implantable Lead Implant Date: 20210406
Implantable Lead Location: 753858
Implantable Lead Location: 753859
Implantable Lead Location: 753860
Implantable Lead Model: 1948
Implantable Pulse Generator Implant Date: 20210406
Lead Channel Impedance Value: 330 Ohm
Lead Channel Impedance Value: 580 Ohm
Lead Channel Impedance Value: 600 Ohm
Lead Channel Pacing Threshold Amplitude: 0.5 V
Lead Channel Pacing Threshold Amplitude: 0.625 V
Lead Channel Pacing Threshold Amplitude: 1.5 V
Lead Channel Pacing Threshold Pulse Width: 0.5 ms
Lead Channel Pacing Threshold Pulse Width: 0.5 ms
Lead Channel Pacing Threshold Pulse Width: 0.7 ms
Lead Channel Sensing Intrinsic Amplitude: 12 mV
Lead Channel Sensing Intrinsic Amplitude: 2.6 mV
Lead Channel Setting Pacing Amplitude: 1.5 V
Lead Channel Setting Pacing Amplitude: 2 V
Lead Channel Setting Pacing Amplitude: 2 V
Lead Channel Setting Pacing Pulse Width: 0.5 ms
Lead Channel Setting Pacing Pulse Width: 0.7 ms
Lead Channel Setting Sensing Sensitivity: 4 mV
Pulse Gen Model: 3562
Pulse Gen Serial Number: 6058048

## 2024-09-02 NOTE — Telephone Encounter (Signed)
 Requesting office sent duplicate request inquiring if pt has been cleared. Will update the requesting office pt has appt 09/03/24 Jodie Passey, PAC. Once the pt has been cleared the provider will be sure to fax their notes to the requesting office.

## 2024-09-02 NOTE — Progress Notes (Signed)
 Remote PPM Transmission

## 2024-09-03 ENCOUNTER — Encounter: Payer: Self-pay | Admitting: Student

## 2024-09-03 ENCOUNTER — Ambulatory Visit: Payer: Self-pay | Attending: Student | Admitting: Student

## 2024-09-03 VITALS — BP 138/80 | HR 83 | Ht 68.0 in | Wt 152.0 lb

## 2024-09-03 DIAGNOSIS — I2510 Atherosclerotic heart disease of native coronary artery without angina pectoris: Secondary | ICD-10-CM | POA: Diagnosis not present

## 2024-09-03 DIAGNOSIS — I442 Atrioventricular block, complete: Secondary | ICD-10-CM

## 2024-09-03 DIAGNOSIS — I428 Other cardiomyopathies: Secondary | ICD-10-CM | POA: Diagnosis not present

## 2024-09-03 DIAGNOSIS — Z950 Presence of cardiac pacemaker: Secondary | ICD-10-CM | POA: Diagnosis not present

## 2024-09-03 DIAGNOSIS — I5022 Chronic systolic (congestive) heart failure: Secondary | ICD-10-CM

## 2024-09-03 LAB — CUP PACEART INCLINIC DEVICE CHECK
Battery Remaining Longevity: 25 mo
Battery Voltage: 2.93 V
Brady Statistic RA Percent Paced: 80 %
Brady Statistic RV Percent Paced: 99 %
Date Time Interrogation Session: 20260109110539
Implantable Lead Connection Status: 753985
Implantable Lead Connection Status: 753985
Implantable Lead Connection Status: 753985
Implantable Lead Implant Date: 20160329
Implantable Lead Implant Date: 20160329
Implantable Lead Implant Date: 20210406
Implantable Lead Location: 753858
Implantable Lead Location: 753859
Implantable Lead Location: 753860
Implantable Lead Model: 1948
Implantable Pulse Generator Implant Date: 20210406
Lead Channel Impedance Value: 362.5 Ohm
Lead Channel Impedance Value: 625 Ohm
Lead Channel Impedance Value: 650 Ohm
Lead Channel Pacing Threshold Amplitude: 0.5 V
Lead Channel Pacing Threshold Amplitude: 0.625 V
Lead Channel Pacing Threshold Amplitude: 1.5 V
Lead Channel Pacing Threshold Pulse Width: 0.5 ms
Lead Channel Pacing Threshold Pulse Width: 0.5 ms
Lead Channel Pacing Threshold Pulse Width: 0.7 ms
Lead Channel Sensing Intrinsic Amplitude: 1.7 mV
Lead Channel Sensing Intrinsic Amplitude: 12 mV
Lead Channel Setting Pacing Amplitude: 1.5 V
Lead Channel Setting Pacing Amplitude: 2 V
Lead Channel Setting Pacing Amplitude: 2 V
Lead Channel Setting Pacing Pulse Width: 0.5 ms
Lead Channel Setting Pacing Pulse Width: 0.7 ms
Lead Channel Setting Sensing Sensitivity: 4 mV
Pulse Gen Model: 3562
Pulse Gen Serial Number: 6058048

## 2024-09-03 LAB — CBC
Hematocrit: 30.8 % — ABNORMAL LOW (ref 37.5–51.0)
Hemoglobin: 9.9 g/dL — ABNORMAL LOW (ref 13.0–17.7)
MCH: 30.9 pg (ref 26.6–33.0)
MCHC: 32.1 g/dL (ref 31.5–35.7)
MCV: 96 fL (ref 79–97)
Platelets: 196 x10E3/uL (ref 150–450)
RBC: 3.2 x10E6/uL — ABNORMAL LOW (ref 4.14–5.80)
RDW: 15.5 % — ABNORMAL HIGH (ref 11.6–15.4)
WBC: 8.8 x10E3/uL (ref 3.4–10.8)

## 2024-09-03 LAB — BASIC METABOLIC PANEL WITH GFR
BUN/Creatinine Ratio: 23 (ref 10–24)
BUN: 24 mg/dL (ref 10–36)
CO2: 23 mmol/L (ref 20–29)
Calcium: 8.6 mg/dL (ref 8.6–10.2)
Chloride: 105 mmol/L (ref 96–106)
Creatinine, Ser: 1.04 mg/dL (ref 0.76–1.27)
Glucose: 134 mg/dL — ABNORMAL HIGH (ref 70–99)
Potassium: 4.4 mmol/L (ref 3.5–5.2)
Sodium: 140 mmol/L (ref 134–144)
eGFR: 68 mL/min/1.73

## 2024-09-03 NOTE — Progress Notes (Signed)
" °  Electrophysiology Office Note:   ID:  Andrew Marshall, Andrew Marshall [birth year], MRN [id number]  Primary Cardiologist: Oneil Parchment, MD Electrophysiologist: Eulas FORBES Furbish, MD      History of Present Illness:   Andrew Marshall is a 89 y.o. 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   Since last being seen in our clinic the patient reports doing well from a cardiac perspective. Having GI issues and pending Colonoscopy to r/o malignancy. Otherwise, he denies chest pain, palpitations, dyspnea, PND, orthopnea, nausea, vomiting, dizziness, syncope, edema, weight gain, or early satiety.   Review of systems complete and found to be negative unless listed in HPI.   EP Information / Studies Reviewed:    EKG is ordered today. Personal review as below.  EKG Interpretation Date/Time:  Friday September 03 2024 10:41:39 EST Ventricular Rate:  83 PR Interval:  156 QRS Duration:  182 QT Interval:  454 QTC Calculation: 533 R Axis:   -75  Text Interpretation: AV dual-paced rhythm Biventricular pacemaker detected Confirmed by Marshall Heck ([id number]) on 09/03/2024 10:47:54 AM    PPM Interrogation-  reviewed in detail today,  See PACEART report.  Arrhythmia/Device History [employer] Jude PPM implanted 2016 as dual chamber with BIV upgrade 11/30/2019 for CHB and NICM vs mixed ICM/NICM    Physical Exam:   VS:  BP 138/80   Pulse 83   Ht 5' 8 (1.727 m)   Wt 152 lb (68.9 kg)   SpO2 96%   BMI 23.11 kg/m    Wt Readings from Last 3 Encounters:  09/03/24 152 lb (68.9 kg)  10/25/23 140 lb (63.5 kg)  09/12/23 143 lb 12.8 oz (65.2 kg)     GEN: No acute distress  NECK: No JVD; No carotid bruits CARDIAC: Regular rate and rhythm, no murmurs, rubs, gallops RESPIRATORY:  Clear to auscultation without rales, wheezing or rhonchi  ABDOMEN: Soft, non-tender, non-distended EXTREMITIES:  No edema; No deformity    ASSESSMENT AND PLAN:    CHB s/p Abbott PPM  Normal PPM function See Andrew Marshall report No changes today  Chronic systolic CHF s/p BIV upgrade Echo 01/2022 with LVEF 35-40% Continue coreg  and losartan   PAF, subclinical None on check today  HTN Stable on current regimen   H/o Near syncope Felt to be hypotensive, would not rechallenge with Entresto   CAD No s/s of ischemia.      Cardiac Clearance for Colonoscopy No concerns from a cardiac perspective. Discussed with Dr. Parchment who gave OK to give clearance given lack of symptoms.  The patient is at low to moderate risk to proceed without further work up given his age and co-morbidities.  If the patient has new chest pain or SOB prior to surgery, they should be revaluated.   Disposition:   Follow up with EP Team in 12 months  Signed, Andrew Prentice Lesia, Andrew Marshall  "

## 2024-09-03 NOTE — Patient Instructions (Signed)
 Medication Instructions:  No medication changes today. *If you need a refill on your cardiac medications before your next appointment, please call your pharmacy*  Lab Work: BMET and CBC today If you have labs (blood work) drawn today and your tests are completely normal, you will receive your results only by: MyChart Message (if you have MyChart) OR A paper copy in the mail If you have any lab test that is abnormal or we need to change your treatment, we will call you to review the results.  Testing/Procedures: No testing ordered today  Follow-Up: At [facility], you and your health needs are our priority.  As part of our continuing mission to provide you with exceptional heart care, our providers are all part of one team.  This team includes your primary Cardiologist (physician) and Advanced Practice Providers or APPs (Physician Assistants and Nurse Practitioners) who all work together to provide you with the care you need, when you need it.  Your next appointment:   12 month(s)  Provider:   You may see Eulas FORBES Furbish, MD or one of the following Advanced Practice Providers on your designated Care Team:   Charlies Arthur, PA-C Nikesha Kwasny Andy Chukwuemeka Artola, PA-C Suzann Riddle, NP Daphne Barrack, NP    We recommend signing up for the patient portal called MyChart.  Sign up information is provided on this After Visit Summary.  MyChart is used to connect with patients for Virtual Visits (Telemedicine).  Patients are able to view lab/test results, encounter notes, upcoming appointments, etc.  Non-urgent messages can be sent to your provider as well.   To learn more about what you can do with MyChart, go to [email or web address].

## 2024-09-04 ENCOUNTER — Ambulatory Visit: Payer: Self-pay | Admitting: Cardiovascular Disease

## 2024-09-07 ENCOUNTER — Encounter: Payer: Self-pay | Admitting: Cardiovascular Disease

## 2024-09-07 NOTE — Progress Notes (Signed)
 PERIOPERATIVE PRESCRIPTION FOR IMPLANTED CARDIAC DEVICE PROGRAMMING   Patient Information:  Patient: Andrew Marshall  MRN: [id number]  Date of Birth: 1931-06-17   Procedure:  COLONOSCOPY   Date of Surgery:  Clearance TBD                                  Surgeon:  DR. ELICIA Surgeon's Group or Practice Name:  [facility] Phone number:  [phone] Fax number:  [phone]   Device Information:   Clinic EP Physician:   Dr. Eulas Furbish Device Type:  Pacemaker Manufacturer and Phone #:  [employer]: [phone] Pacemaker Dependent?:  Yes Date of Last Device Check:  09/06/2024         Normal Device Function?:  Yes     Electrophysiologist's Recommendations:   Have magnet available. Provide continuous ECG monitoring when magnet is used or reprogramming is to be performed.  Procedure will likely interfere with device function.  Device should be programmed:  Asynchronous pacing during procedure and returned to normal programming after procedure  Per Device Clinic Standing Orders, Andrew Marshall  09/07/2024 10:25 AM

## 2024-09-07 NOTE — Telephone Encounter (Signed)
 Recommendations faxed to requesting office.

## 2024-09-07 NOTE — Telephone Encounter (Signed)
"  ° °  Primary Cardiologist: Oneil Parchment, MD  Chart reviewed as part of pre-operative protocol coverage. Given past medical history and time since last visit, based on ACC/AHA guidelines, Andrew Marshall would be at acceptable risk for the planned procedure without further cardiovascular testing.   Patient should contact our office if he is having new symptoms that are concerning from a cardiac perspective to arrange a follow-up appointment.    Per office protocol, he may hold Plavix  for 5 days prior to procedure and should resume as soon as hemodynamically stable postoperatively.  I will route this recommendation to the requesting party via Epic fax function and remove from pre-op pool.  Please call with questions.  Rosaline EMERSON Bane, NP-C 09/07/2024, 10:22 AM [address], [address] [place], [place] [address] Office [phone] Fax [phone]     "

## 2024-09-07 NOTE — Telephone Encounter (Signed)
 Receive another clearance from [facility] with updated procedure date and it changed from colonoscopy to a colonoscopy AND endoscopy. Also needs the OK to hold Plavix  for 5 days prior. The pt has a SJ BiV PPM that was not listed on original clearance.           Pre-operative Risk Assessment    Patient Name: Andrew Marshall  DOB: Dec 26, 1930 MRN: [id number]   Date of last office visit: 09/03/2024 with Ozell Prentice Passey, PA-C Date of next office visit: 09/08/2024 with Dr. Jeffrie  Request for Surgical Clearance    Procedure:  Colonscopy AND endoscopy  Date of Surgery:  Clearance 09/14/24                                 Surgeon:  Dr. Layla Lah   Surgeon's Group or Practice Name:  [facility] Phone number:  [phone] Fax number:  [phone]   Type of Clearance Requested:   - Medical  - Pharmacy:  Hold Clopidogrel  (Plavix ) -5 days prior   Type of Anesthesia:  Propofol    Additional requests/questions:  PT HAS A SJ BiV PPM  Signed, Patrcia Iverson CROME   09/07/2024, 9:29 AM

## 2024-09-08 ENCOUNTER — Ambulatory Visit: Payer: Self-pay | Attending: Cardiology | Admitting: Cardiology

## 2024-09-08 ENCOUNTER — Encounter: Payer: Self-pay | Admitting: Cardiology

## 2024-09-08 VITALS — BP 102/50 | HR 67 | Ht 68.0 in | Wt 151.0 lb

## 2024-09-08 DIAGNOSIS — I340 Nonrheumatic mitral (valve) insufficiency: Secondary | ICD-10-CM

## 2024-09-08 DIAGNOSIS — I5022 Chronic systolic (congestive) heart failure: Secondary | ICD-10-CM | POA: Diagnosis not present

## 2024-09-08 DIAGNOSIS — I255 Ischemic cardiomyopathy: Secondary | ICD-10-CM

## 2024-09-08 DIAGNOSIS — Z950 Presence of cardiac pacemaker: Secondary | ICD-10-CM

## 2024-09-08 DIAGNOSIS — I442 Atrioventricular block, complete: Secondary | ICD-10-CM | POA: Diagnosis not present

## 2024-09-08 NOTE — Progress Notes (Signed)
 " Cardiology Office Note:  .   Date:  09/08/2024  ID:  [name], DOB [birth year], MRN [id number] PCP: Regino Slater, MD  [facility] Providers Cardiologist:  Oneil Parchment, MD Electrophysiologist:  Eulas FORBES Furbish, MD     History of Present Illness: .   Andrew Marshall is a 89 y.o. male Discussed the use of AI scribe History of Present Illness Andrew Marshall is a 89 year old male with complete heart block status post pacemaker implantation who presents for follow-up.  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 Her ejection fraction has been between 35-40% in 2023. She is currently on carvedilol  12.5 mg twice a day and losartan  50 mg daily. Her blood pressure has been fluctuating, and she recently increased her losartan  dose from 25 mg to 50 mg. She has been monitoring her blood pressure and notes some low readings, such as 109 and 101, but states she has been like that for years.  She has a history of paroxysmal atrial fibrillation, which has been subclinical with no recent episodes. She is also on atorvastatin  40 mg daily for hyperlipidemia.  She feels 'pretty good' overall but mentions some gastrointestinal issues.  She has not been as active as she used to be, partly due to the weather, but there is no medical reason preventing her from being active.  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    Studies Reviewed: .        Results Diagnostic Echocardiogram (2023): Left ventricular ejection fraction 35-40%, moderate mitral regurgitation, mild aortic stenosis Risk Assessment/Calculations:            Physical Exam:   VS:  BP (!) 102/50   Pulse 67   Ht 5' 8 (1.727 m)   Wt 151 lb (68.5 kg)   SpO2 97%   BMI 22.96 kg/m    Wt Readings from Last 3 Encounters:  09/08/24 151 lb (68.5 kg)  09/03/24 152 lb (68.9 kg)   10/25/23 140 lb (63.5 kg)    GEN: Well nourished, well developed in no acute distress NECK: No JVD; No carotid bruits CARDIAC: RRR, 3/6 SM, no rubs, no gallops RESPIRATORY:  Clear to auscultation without rales, wheezing or rhonchi  ABDOMEN: Soft, non-tender, non-distended EXTREMITIES:  No edema; No deformity   ASSESSMENT AND PLAN: .    Assessment and Plan Assessment & Plan Complete heart block status post pacemaker implantation Pacemaker functioning well with no issues reported. Bi V. EP note reviewed  Chronic systolic heart failure (EF 35-40%) Ejection fraction remains reduced at 35-40% as of 2023. Symptoms consistent with class I-II heart failure, with mild symptoms during activity. - Ordered echocardiogram to assess heart function and valve status.  Paroxysmal atrial fibrillation Subclinical and no recent episodes reported.  Moderate mitral regurgitation Noted on last echocardiogram. - Ordered echocardiogram to assess mitral regurgitation.  Mild aortic stenosis Noted on last echocardiogram. - Ordered echocardiogram to assess aortic stenosis.  Hypertension Blood pressure readings are variable. Recently increased losartan  to 50 mg daily. Monitoring for low blood pressure readings and symptoms such as dizziness. - Continue losartan  50 mg daily. - Monitor blood pressure and symptoms.  Hyperlipidemia Managed with atorvastatin  40 mg daily. - Continue atorvastatin  40 mg daily. Has been LDL < 70         Dispo: 1 yr  Medical Illustrator, Oneil  Jeffrie, MD  "

## 2024-09-08 NOTE — Patient Instructions (Signed)
 Medication Instructions:   Your physician recommends that you continue on your current medications as directed. Please refer to the Current Medication list given to you today.   *If you need a refill on your cardiac medications before your next appointment, please call your pharmacy*    Lab Work: NONE ORDERED  TODAY    If you have labs (blood work) drawn today and your tests are completely normal, you will receive your results only by: MyChart Message (if you have MyChart) OR A paper copy in the mail If you have any lab test that is abnormal or we need to change your treatment, we will call you to review the results.     Testing/Procedures:  NONE ORDERED  TODAY      Follow-Up: At [facility], you and your health needs are our priority.  As part of our continuing mission to provide you with exceptional heart care, our providers are all part of one team.  This team includes your primary Cardiologist (physician) and Advanced Practice Providers or APPs (Physician Assistants and Nurse Practitioners) who all work together to provide you with the care you need, when you need it.  Your next appointment:    1 year(s)  Provider:   Oneil Parchment, MD    We recommend signing up for the patient portal called MyChart.  Sign up information is provided on this After Visit Summary.  MyChart is used to connect with patients for Virtual Visits (Telemedicine).  Patients are able to view lab/test results, encounter notes, upcoming appointments, etc.  Non-urgent messages can be sent to your provider as well.   To learn more about what you can do with MyChart, go to [email or web address].   Other Instructions

## 2024-09-20 ENCOUNTER — Ambulatory Visit: Payer: Self-pay

## 2024-09-20 ENCOUNTER — Telehealth: Payer: Self-pay

## 2024-09-20 DIAGNOSIS — Z950 Presence of cardiac pacemaker: Secondary | ICD-10-CM

## 2024-09-20 DIAGNOSIS — I5022 Chronic systolic (congestive) heart failure: Secondary | ICD-10-CM | POA: Diagnosis not present

## 2024-09-20 NOTE — Telephone Encounter (Signed)
 Remote ICM transmission received.  Attempted call to patient regarding ICM remote transmission and no answer.

## 2024-09-20 NOTE — Progress Notes (Signed)
 EPIC Encounter for ICM Monitoring  Patient Name: Andrew Marshall is a 89 y.o. male Date: 09/20/2024 Primary Care Physican: Regino Slater, MD Primary Cardiologist: Jeffrie Electrophysiologist: Mealor Bi-V Pacing: >99%            10/30/2022 Weight: 140-143 lbs 01/07/2023 Weight: 140-143 lbs 06/04/2023 Weight: 139-140 lbs 09/12/2023 Office Weight: 143 lbs 10/14/2023 Weight: 140-142 lbs  11/19/2023 Weight: 140 lbs 12/22/2023 Weight: 140 lbs   04/27/2024 Weight: 135 - 140 lbs                                                         Attempted call to patient and unable to reach.   Transmission results reviewed.        Since 08/16/2024 ICM Remote Transmission: CorVue thoracic impedance suggesting normal fluid levels.   Prescribed: No diuretic   Recommendations:  Unable to reach.     Follow-up plan: ICM clinic phone appointment on 10/21/2024.  91 day device clinic remote transmission 11/29/2024.     EP/Cardiology Office Visits:  None scheduled.    Copy of ICM check sent to Dr Nancey.    Remote monitoring is medically necessary for Heart Failure Management.    Daily Thoracic Impedance ICM trend: 06/22/2024 through 09/20/2024.    Mitzie GORMAN Garner, RN 09/20/2024 4:57 PM

## 2024-09-23 ENCOUNTER — Other Ambulatory Visit: Payer: Self-pay | Admitting: Cardiology

## 2024-09-23 NOTE — Progress Notes (Signed)
 31 day ICM Remote transmission canceled due to [facility] clinic is on hold until further notice.  91 day remote monitoring will continue per protocol.

## 2024-09-28 NOTE — Telephone Encounter (Signed)
 In accordance with refill protocols, please review and address the following requirements before this medication refill can be authorized:  Labs labs needed within 365 days and pt has not done so

## 2024-10-20 ENCOUNTER — Ambulatory Visit (HOSPITAL_COMMUNITY)

## 2024-10-21 ENCOUNTER — Ambulatory Visit

## 2024-11-29 ENCOUNTER — Ambulatory Visit: Payer: Medicare Other

## 2025-02-28 ENCOUNTER — Ambulatory Visit: Payer: Medicare Other

## 2025-05-30 ENCOUNTER — Ambulatory Visit: Payer: Medicare Other
